# Patient Record
Sex: Male | Born: 1935 | Race: White | Hispanic: No | Marital: Married | Smoking: Never smoker
Health system: Southern US, Community
[De-identification: ages and names within clinical notes are randomized; demographics above are authoritative.]

## PROBLEM LIST (undated history)

## (undated) ENCOUNTER — Emergency Department (HOSPITAL_COMMUNITY): Payer: Medicare Other

## (undated) DIAGNOSIS — E039 Hypothyroidism, unspecified: Secondary | ICD-10-CM

## (undated) DIAGNOSIS — H5440 Blindness, one eye, unspecified eye: Secondary | ICD-10-CM

## (undated) DIAGNOSIS — C4491 Basal cell carcinoma of skin, unspecified: Secondary | ICD-10-CM

## (undated) DIAGNOSIS — C61 Malignant neoplasm of prostate: Secondary | ICD-10-CM

## (undated) DIAGNOSIS — D229 Melanocytic nevi, unspecified: Secondary | ICD-10-CM

## (undated) DIAGNOSIS — I10 Essential (primary) hypertension: Secondary | ICD-10-CM

## (undated) DIAGNOSIS — E785 Hyperlipidemia, unspecified: Secondary | ICD-10-CM

## (undated) DIAGNOSIS — C4492 Squamous cell carcinoma of skin, unspecified: Secondary | ICD-10-CM

## (undated) DIAGNOSIS — H269 Unspecified cataract: Secondary | ICD-10-CM

## (undated) DIAGNOSIS — Z87442 Personal history of urinary calculi: Secondary | ICD-10-CM

## (undated) DIAGNOSIS — C7951 Secondary malignant neoplasm of bone: Secondary | ICD-10-CM

## (undated) DIAGNOSIS — D751 Secondary polycythemia: Secondary | ICD-10-CM

## (undated) HISTORY — DX: Malignant neoplasm of prostate: C61

## (undated) HISTORY — PX: TONSILLECTOMY: SUR1361

## (undated) HISTORY — DX: Blindness, one eye, unspecified eye: H54.40

## (undated) HISTORY — PX: HERNIA REPAIR: SHX51

## (undated) HISTORY — DX: Secondary malignant neoplasm of bone: C79.51

## (undated) HISTORY — DX: Secondary polycythemia: D75.1

## (undated) HISTORY — DX: Hyperlipidemia, unspecified: E78.5

## (undated) HISTORY — PX: EYE SURGERY: SHX253

## (undated) HISTORY — PX: COLONOSCOPY: SHX174

## (undated) HISTORY — PX: MOHS SURGERY: SHX181

## (undated) HISTORY — DX: Unspecified cataract: H26.9

## (undated) HISTORY — PX: CATARACT EXTRACTION: SUR2

## (undated) HISTORY — PX: BLADDER SURGERY: SHX569

## (undated) HISTORY — PX: PROSTATE SURGERY: SHX751

## (undated) HISTORY — DX: Squamous cell carcinoma of skin, unspecified: C44.92

## (undated) HISTORY — DX: Essential (primary) hypertension: I10

---
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
## 1988-10-24 DIAGNOSIS — C61 Malignant neoplasm of prostate: Secondary | ICD-10-CM

## 1988-10-24 HISTORY — DX: Malignant neoplasm of prostate: C61

## 1997-08-06 ENCOUNTER — Ambulatory Visit (HOSPITAL_COMMUNITY): Admission: RE | Admit: 1997-08-06 | Discharge: 1997-08-06 | Payer: Self-pay | Admitting: Urology

## 1998-01-10 ENCOUNTER — Encounter: Admission: RE | Admit: 1998-01-10 | Discharge: 1998-04-10 | Payer: Self-pay | Admitting: Radiation Oncology

## 1998-08-15 ENCOUNTER — Ambulatory Visit (HOSPITAL_COMMUNITY): Admission: RE | Admit: 1998-08-15 | Discharge: 1998-08-15 | Payer: Self-pay | Admitting: Urology

## 2000-10-08 ENCOUNTER — Ambulatory Visit (HOSPITAL_COMMUNITY): Admit: 2000-10-08 | Discharge: 2000-10-08 | Payer: Self-pay | Admitting: Urology

## 2000-10-08 ENCOUNTER — Ambulatory Visit (HOSPITAL_COMMUNITY): Admission: RE | Admit: 2000-10-08 | Discharge: 2000-10-08 | Payer: Self-pay | Admitting: Urology

## 2000-10-08 ENCOUNTER — Encounter: Payer: Self-pay | Admitting: Urology

## 2002-09-07 DIAGNOSIS — D229 Melanocytic nevi, unspecified: Secondary | ICD-10-CM

## 2002-09-07 DIAGNOSIS — C4491 Basal cell carcinoma of skin, unspecified: Secondary | ICD-10-CM

## 2002-09-07 HISTORY — DX: Melanocytic nevi, unspecified: D22.9

## 2002-09-07 HISTORY — DX: Basal cell carcinoma of skin, unspecified: C44.91

## 2003-01-12 ENCOUNTER — Emergency Department (HOSPITAL_COMMUNITY): Admission: EM | Admit: 2003-01-12 | Discharge: 2003-01-12 | Payer: Self-pay | Admitting: Emergency Medicine

## 2003-01-15 ENCOUNTER — Ambulatory Visit (HOSPITAL_COMMUNITY): Admission: RE | Admit: 2003-01-15 | Discharge: 2003-01-15 | Payer: Self-pay | Admitting: Urology

## 2003-01-15 ENCOUNTER — Ambulatory Visit (HOSPITAL_BASED_OUTPATIENT_CLINIC_OR_DEPARTMENT_OTHER): Admission: RE | Admit: 2003-01-15 | Discharge: 2003-01-15 | Payer: Self-pay | Admitting: Urology

## 2005-01-13 ENCOUNTER — Ambulatory Visit (HOSPITAL_COMMUNITY): Admission: RE | Admit: 2005-01-13 | Discharge: 2005-01-13 | Payer: Self-pay | Admitting: Urology

## 2005-01-13 ENCOUNTER — Ambulatory Visit (HOSPITAL_BASED_OUTPATIENT_CLINIC_OR_DEPARTMENT_OTHER): Admission: RE | Admit: 2005-01-13 | Discharge: 2005-01-13 | Payer: Self-pay | Admitting: Urology

## 2005-03-06 ENCOUNTER — Emergency Department (HOSPITAL_COMMUNITY): Admission: EM | Admit: 2005-03-06 | Discharge: 2005-03-06 | Payer: Self-pay | Admitting: Emergency Medicine

## 2005-03-20 ENCOUNTER — Encounter: Admission: RE | Admit: 2005-03-20 | Discharge: 2005-03-20 | Payer: Self-pay | Admitting: Urology

## 2005-03-24 ENCOUNTER — Ambulatory Visit (HOSPITAL_BASED_OUTPATIENT_CLINIC_OR_DEPARTMENT_OTHER): Admission: RE | Admit: 2005-03-24 | Discharge: 2005-03-24 | Payer: Self-pay | Admitting: Urology

## 2005-03-30 ENCOUNTER — Emergency Department (HOSPITAL_COMMUNITY): Admission: EM | Admit: 2005-03-30 | Discharge: 2005-03-30 | Payer: Self-pay | Admitting: Emergency Medicine

## 2005-06-02 ENCOUNTER — Ambulatory Visit (HOSPITAL_BASED_OUTPATIENT_CLINIC_OR_DEPARTMENT_OTHER): Admission: RE | Admit: 2005-06-02 | Discharge: 2005-06-02 | Payer: Self-pay | Admitting: Urology

## 2009-04-03 DIAGNOSIS — C4492 Squamous cell carcinoma of skin, unspecified: Secondary | ICD-10-CM

## 2009-04-03 HISTORY — DX: Squamous cell carcinoma of skin, unspecified: C44.92

## 2011-06-30 ENCOUNTER — Encounter: Payer: Self-pay | Admitting: *Deleted

## 2011-11-09 ENCOUNTER — Telehealth: Payer: Self-pay | Admitting: Oncology

## 2011-11-09 NOTE — Telephone Encounter (Signed)
S/W pt in re NP appt 9/25 @ 10:30 w /Dr. Gaylyn Rong. Referring Dr. Jarold Motto Dx- Polycythemia  NP packet mail

## 2011-11-11 ENCOUNTER — Telehealth: Payer: Self-pay | Admitting: Oncology

## 2011-11-11 NOTE — Telephone Encounter (Signed)
C/D on 9/18 for appt 9/25.

## 2011-11-13 ENCOUNTER — Encounter: Payer: Self-pay | Admitting: Oncology

## 2011-11-13 DIAGNOSIS — D751 Secondary polycythemia: Secondary | ICD-10-CM | POA: Insufficient documentation

## 2011-11-18 ENCOUNTER — Other Ambulatory Visit: Payer: Self-pay | Admitting: Oncology

## 2011-11-18 ENCOUNTER — Ambulatory Visit (HOSPITAL_BASED_OUTPATIENT_CLINIC_OR_DEPARTMENT_OTHER): Payer: Medicare Other | Admitting: Oncology

## 2011-11-18 ENCOUNTER — Encounter: Payer: Self-pay | Admitting: Oncology

## 2011-11-18 ENCOUNTER — Ambulatory Visit: Payer: Medicare Other

## 2011-11-18 ENCOUNTER — Other Ambulatory Visit (HOSPITAL_BASED_OUTPATIENT_CLINIC_OR_DEPARTMENT_OTHER): Payer: Medicare Other | Admitting: Lab

## 2011-11-18 ENCOUNTER — Telehealth: Payer: Self-pay | Admitting: Oncology

## 2011-11-18 VITALS — BP 146/84 | HR 71 | Temp 96.8°F | Resp 20 | Ht 71.0 in | Wt 198.5 lb

## 2011-11-18 DIAGNOSIS — E785 Hyperlipidemia, unspecified: Secondary | ICD-10-CM

## 2011-11-18 DIAGNOSIS — Z8546 Personal history of malignant neoplasm of prostate: Secondary | ICD-10-CM

## 2011-11-18 DIAGNOSIS — D751 Secondary polycythemia: Secondary | ICD-10-CM

## 2011-11-18 DIAGNOSIS — I10 Essential (primary) hypertension: Secondary | ICD-10-CM

## 2011-11-18 DIAGNOSIS — D45 Polycythemia vera: Secondary | ICD-10-CM

## 2011-11-18 LAB — CBC WITH DIFFERENTIAL/PLATELET
Basophils Absolute: 0 10*3/uL (ref 0.0–0.1)
EOS%: 3.1 % (ref 0.0–7.0)
HGB: 16.8 g/dL (ref 13.0–17.1)
MCH: 32 pg (ref 27.2–33.4)
MONO%: 9 % (ref 0.0–14.0)
NEUT#: 4.8 10*3/uL (ref 1.5–6.5)
RBC: 5.24 10*6/uL (ref 4.20–5.82)
RDW: 13.9 % (ref 11.0–14.6)
lymph#: 1.4 10*3/uL (ref 0.9–3.3)

## 2011-11-18 LAB — COMPREHENSIVE METABOLIC PANEL (CC13)
AST: 32 U/L (ref 5–34)
Albumin: 4.1 g/dL (ref 3.5–5.0)
Alkaline Phosphatase: 79 U/L (ref 40–150)
BUN: 17 mg/dL (ref 7.0–26.0)
Creatinine: 1.3 mg/dL (ref 0.7–1.3)
Glucose: 89 mg/dl (ref 70–99)
Potassium: 4.3 mEq/L (ref 3.5–5.1)
Total Bilirubin: 0.7 mg/dL (ref 0.20–1.20)

## 2011-11-18 LAB — MORPHOLOGY: PLT EST: ADEQUATE

## 2011-11-18 NOTE — Progress Notes (Signed)
Checked in new pt with no financial concerns. °

## 2011-11-18 NOTE — Telephone Encounter (Signed)
gve the pt his sept 2014 appt calendar °

## 2011-11-18 NOTE — Patient Instructions (Addendum)
1. Diagnosis: Polycythemia (elevated rather cell count). 2. Possibility: polycythemia vera PV (spontaneous production of red blood cell by bone marrow without hormonal input) versus hypoxia (smoking, obstructive sleep apnea) 3. Work up:  JAK-2 mutation testing to rule out PV.  4. Treatment:  Pending work up.  5. Follow up:  CBC testing every 4 months here at the [facility].  Return visit in 1 year.

## 2011-11-18 NOTE — Progress Notes (Signed)
[facility]  Telephone:(336) [phone] Fax:(336) [phone]     INITIAL HEMATOLOGY CONSULTATION    Referral MD:  Dr. Barry Dienes. Martin Curry, [name]   Reason for Referral: polycythemia.     HPI:  [name] is a 78 year-old man with history of HTN, HLP, prostate cancer, non smoker.  He has always had Hgb in the upper range of normal.  He had a routine CBC drawn on 11/03/2011 which showed WBC 7.8; Hgb 18.5 (ref 12-18); Plt 240.  He was kindly referred to the [facility] for evaluation.  Of note, the day when he last had his CBC drawn, he was fasting and was not even drinking water.   [name] presented to the clinic for the first time today by himself.  He reports feeling well.  He denied fever, skin rash, skin darkening, erythromelalgia, snoring, feeling tired when he first wakes up in the morning.   Patient denies fever, anorexia, weight loss, fatigue, headache, visual changes, confusion, drenching night sweats, palpable lymph node swelling, mucositis, odynophagia, dysphagia, nausea vomiting, jaundice, chest pain, palpitation, shortness of breath, dyspnea on exertion, productive cough, gum bleeding, epistaxis, hematemesis, hemoptysis, abdominal pain, abdominal swelling, early satiety, melena, hematochezia, hematuria, skin rash, spontaneous bleeding, joint swelling, joint pain, heat or cold intolerance, bowel bladder incontinence, back pain, focal motor weakness, paresthesia, depression, suicidal or homicidal ideation, feeling hopelessness.   Past Medical History  Diagnosis Date  . Hypertension   . Hyperlipidemia   . Prostate cancer 1990's    total prostatectomy and adjuvant radiation. Last PSA was 0.02 in 09/2011  . 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 Polycythemia   . Cancer, skin, squamous cell   :    Past Surgical History  Procedure Date  . Prostate surgery   . Hernia repair   . Bladder surgery     due to prostatectomy.   . Mohs surgery   . Cataract  extraction     right  . Colonoscopy     neg in the past; due in 2014 with [place] Dr. Jarold Motto  :   CURRENT MEDS: Current Outpatient Prescriptions  Medication Sig Dispense Refill  . amLODipine-benazepril (LOTREL) 5-10 MG per capsule Take 1 capsule by mouth daily.      [name] Kitchen aspirin 81 MG tablet Take 81 mg by mouth daily.      . Multiple Vitamin (MULTIVITAMIN) tablet Take 1 tablet by mouth daily.      . potassium citrate (UROCIT-K) 10 MEQ (1080 MG) SR tablet Take 10 mEq by mouth 3 (three) times daily with meals.      . simvastatin (ZOCOR) 20 MG tablet Take 20 mg by mouth every evening.          Allergies  Allergen Reactions  . Penicillins   . Latex Rash  :  Family History  Problem Relation Age of Onset  . Uterine cancer Mother 68  . Lung cancer Father 18  . Prostate cancer Brother   . Prostate cancer Brother   :  History   Social History  . Marital Status: Married    Spouse Name: N/A    Number of Children: 3  . Years of Education: N/A   Occupational History  .      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 Smoking status: Never Smoker   . Smokeless tobacco: Not on file  . Alcohol Use: No  . Drug Use:  No  . Sexually Active:    Other Topics Concern  . Not on file   Social History Narrative  . No narrative on file  :  REVIEW OF SYSTEM:  The rest of the 14-point review of sytem was negative.   Exam: ECOG 0  General:  well-nourished man, in no acute distress.  Eyes:  no scleral icterus.  ENT:  There were no oropharyngeal lesions.  Neck was without thyromegaly.  Lymphatics:  Negative cervical, supraclavicular or axillary adenopathy.  Respiratory: lungs were clear bilaterally without wheezing or crackles.  Cardiovascular:  Regular rate and rhythm, S1/S2, without murmur, rub or gallop.  There was no pedal edema.  GI:  abdomen was soft, flat, nontender, nondistended, without organomegaly.  Muscoloskeletal:  no spinal tenderness of  palpation of vertebral spine.  His fingers did not show sign of clubbing or cyanosis.  Skin exam was without echymosis, petichae.  Neuro exam was nonfocal.  Patient was able to get on and off exam table without assistance.  Gait was normal.  Patient was alerted and oriented.  Attention was good.   Language was appropriate.  Mood was normal without depression.  Speech was not pressured.  Thought content was not tangential.    LABS:  Lab Results  Component Value Date   WBC 7.1 11/18/2011   HGB 16.8 11/18/2011   HCT 48.2 11/18/2011   PLT 232 11/18/2011   GLUCOSE 89 11/18/2011   ALT 48 11/18/2011   AST 32 11/18/2011   NA 141 11/18/2011   K 4.3 11/18/2011   CL 109* 11/18/2011   CREATININE 1.3 11/18/2011   BUN 17.0 11/18/2011   CO2 25 11/18/2011    Blood smear review:   I personally reviewed the patient's peripheral blood smear today.  There was isocytosis.  There was no peripheral blast.  There was no schistocytosis, spherocytosis, target cell, rouleaux formation, tear drop cell.  There was no giant platelets or platelet clumps.     ASSESSMENT AND PLAN:   1.  Hypertension:  On amlodipine/benazepril per PCP.  He is not on diuretics.  2.  Hyperlipidemia:  On simvastatin per PCP.  3.  Polycythemia:  *  Possibility: most likely due to transient dehydration last time his CBC was drawn.  I need to rule out polycythemia vera PV (spontaneous production of red blood cell by bone marrow without hormonal input).  He does not smoke.  He does not have sign of obstructive sleep apnea.    *  I sent for JAK-2 mutation testing to rule out PV.  I sent for serum erythropoietin to rule out secondary source of erythropoietin.  If erythropoietin is elevated, I will consider further work up.    *  Treatment:  Pending work up.    *Follow up:  CBC testing every 4 months with PCP to ensure that his Hgb does not rapidly increases.  Return visit in 1 year.     Thank you for this referral.   The length of time of the  face-to-face encounter was 30  minutes. More than 50% of time was spent counseling and coordination of care.

## 2011-11-19 LAB — ERYTHROPOIETIN: Erythropoietin: 8.8 m[IU]/mL (ref 2.6–34.0)

## 2011-12-04 ENCOUNTER — Encounter: Payer: Self-pay | Admitting: Cardiology

## 2012-03-28 ENCOUNTER — Telehealth: Payer: Self-pay | Admitting: *Deleted

## 2012-03-28 NOTE — Telephone Encounter (Signed)
Pt called states seeing Dr. [name] on Wed and Dr. Gaylyn Rong instructed for pt to have his CBC checked w/ PCP.  Pt asks what the "code" is for lab since Dr. Silvano Rusk office needs to know.  Informed of Diagnosis code Polycythemia 238.4 for CBC.  He verbalized understanding and will have labs sent to Dr. Gaylyn Rong.

## 2012-11-11 ENCOUNTER — Telehealth: Payer: Self-pay | Admitting: Hematology and Oncology

## 2012-11-11 NOTE — Telephone Encounter (Signed)
Pt called to cx 9/26 lb/fu. Per pt he just saw his primary and the numbers were fine thefore pt/primary decided it was ok to cx this appt. Desk nurse informed.

## 2012-11-18 ENCOUNTER — Other Ambulatory Visit: Payer: Medicare Other | Admitting: Lab

## 2012-11-18 ENCOUNTER — Ambulatory Visit: Payer: Medicare Other | Admitting: Oncology

## 2012-11-21 ENCOUNTER — Other Ambulatory Visit: Payer: Self-pay | Admitting: Dermatology

## 2012-11-21 DIAGNOSIS — C4491 Basal cell carcinoma of skin, unspecified: Secondary | ICD-10-CM

## 2012-11-21 HISTORY — DX: Basal cell carcinoma of skin, unspecified: C44.91

## 2013-03-09 ENCOUNTER — Other Ambulatory Visit: Payer: Self-pay | Admitting: Dermatology

## 2013-05-19 ENCOUNTER — Encounter: Payer: Self-pay | Admitting: Gastroenterology

## 2014-06-12 ENCOUNTER — Other Ambulatory Visit: Payer: Self-pay | Admitting: Dermatology

## 2014-07-19 ENCOUNTER — Other Ambulatory Visit: Payer: Self-pay | Admitting: Dermatology

## 2014-07-19 DIAGNOSIS — C4491 Basal cell carcinoma of skin, unspecified: Secondary | ICD-10-CM

## 2014-07-19 HISTORY — DX: Basal cell carcinoma of skin, unspecified: C44.91

## 2014-09-27 ENCOUNTER — Other Ambulatory Visit: Payer: Self-pay | Admitting: Dermatology

## 2015-07-16 ENCOUNTER — Other Ambulatory Visit: Payer: Self-pay | Admitting: Dermatology

## 2015-07-16 DIAGNOSIS — L309 Dermatitis, unspecified: Secondary | ICD-10-CM | POA: Diagnosis not present

## 2015-07-16 DIAGNOSIS — L905 Scar conditions and fibrosis of skin: Secondary | ICD-10-CM | POA: Diagnosis not present

## 2015-07-16 DIAGNOSIS — L570 Actinic keratosis: Secondary | ICD-10-CM | POA: Diagnosis not present

## 2015-07-16 DIAGNOSIS — C4442 Squamous cell carcinoma of skin of scalp and neck: Secondary | ICD-10-CM | POA: Diagnosis not present

## 2015-07-16 DIAGNOSIS — D485 Neoplasm of uncertain behavior of skin: Secondary | ICD-10-CM | POA: Diagnosis not present

## 2015-07-16 DIAGNOSIS — C4441 Basal cell carcinoma of skin of scalp and neck: Secondary | ICD-10-CM | POA: Diagnosis not present

## 2015-08-15 DIAGNOSIS — C4442 Squamous cell carcinoma of skin of scalp and neck: Secondary | ICD-10-CM | POA: Diagnosis not present

## 2015-09-26 ENCOUNTER — Other Ambulatory Visit: Payer: Self-pay | Admitting: Dermatology

## 2015-09-26 DIAGNOSIS — C4441 Basal cell carcinoma of skin of scalp and neck: Secondary | ICD-10-CM | POA: Diagnosis not present

## 2015-10-14 DIAGNOSIS — Z8546 Personal history of malignant neoplasm of prostate: Secondary | ICD-10-CM | POA: Diagnosis not present

## 2015-10-21 DIAGNOSIS — N393 Stress incontinence (female) (male): Secondary | ICD-10-CM | POA: Diagnosis not present

## 2015-10-21 DIAGNOSIS — Z87442 Personal history of urinary calculi: Secondary | ICD-10-CM | POA: Diagnosis not present

## 2015-10-21 DIAGNOSIS — Z8546 Personal history of malignant neoplasm of prostate: Secondary | ICD-10-CM | POA: Diagnosis not present

## 2015-10-21 DIAGNOSIS — N5201 Erectile dysfunction due to arterial insufficiency: Secondary | ICD-10-CM | POA: Diagnosis not present

## 2015-10-21 DIAGNOSIS — N320 Bladder-neck obstruction: Secondary | ICD-10-CM | POA: Diagnosis not present

## 2015-12-03 DIAGNOSIS — E038 Other specified hypothyroidism: Secondary | ICD-10-CM | POA: Diagnosis not present

## 2015-12-03 DIAGNOSIS — E784 Other hyperlipidemia: Secondary | ICD-10-CM | POA: Diagnosis not present

## 2015-12-03 DIAGNOSIS — N183 Chronic kidney disease, stage 3 (moderate): Secondary | ICD-10-CM | POA: Diagnosis not present

## 2015-12-10 DIAGNOSIS — C61 Malignant neoplasm of prostate: Secondary | ICD-10-CM | POA: Diagnosis not present

## 2015-12-10 DIAGNOSIS — H6123 Impacted cerumen, bilateral: Secondary | ICD-10-CM | POA: Diagnosis not present

## 2015-12-10 DIAGNOSIS — Z0000 Encounter for general adult medical examination without abnormal findings: Secondary | ICD-10-CM | POA: Diagnosis not present

## 2015-12-10 DIAGNOSIS — E038 Other specified hypothyroidism: Secondary | ICD-10-CM | POA: Diagnosis not present

## 2015-12-10 DIAGNOSIS — I10 Essential (primary) hypertension: Secondary | ICD-10-CM | POA: Diagnosis not present

## 2015-12-10 DIAGNOSIS — N183 Chronic kidney disease, stage 3 (moderate): Secondary | ICD-10-CM | POA: Diagnosis not present

## 2015-12-10 DIAGNOSIS — Z6830 Body mass index (BMI) 30.0-30.9, adult: Secondary | ICD-10-CM | POA: Diagnosis not present

## 2015-12-10 DIAGNOSIS — Z23 Encounter for immunization: Secondary | ICD-10-CM | POA: Diagnosis not present

## 2015-12-10 DIAGNOSIS — R32 Unspecified urinary incontinence: Secondary | ICD-10-CM | POA: Diagnosis not present

## 2015-12-10 DIAGNOSIS — E784 Other hyperlipidemia: Secondary | ICD-10-CM | POA: Diagnosis not present

## 2015-12-10 DIAGNOSIS — Z1389 Encounter for screening for other disorder: Secondary | ICD-10-CM | POA: Diagnosis not present

## 2016-02-27 DIAGNOSIS — Z23 Encounter for immunization: Secondary | ICD-10-CM | POA: Diagnosis not present

## 2016-10-13 DIAGNOSIS — Z8546 Personal history of malignant neoplasm of prostate: Secondary | ICD-10-CM | POA: Diagnosis not present

## 2016-10-22 DIAGNOSIS — N393 Stress incontinence (female) (male): Secondary | ICD-10-CM | POA: Diagnosis not present

## 2016-10-22 DIAGNOSIS — N320 Bladder-neck obstruction: Secondary | ICD-10-CM | POA: Diagnosis not present

## 2016-10-22 DIAGNOSIS — Z8546 Personal history of malignant neoplasm of prostate: Secondary | ICD-10-CM | POA: Diagnosis not present

## 2016-10-22 DIAGNOSIS — Z87442 Personal history of urinary calculi: Secondary | ICD-10-CM | POA: Diagnosis not present

## 2016-12-08 DIAGNOSIS — E7849 Other hyperlipidemia: Secondary | ICD-10-CM | POA: Diagnosis not present

## 2016-12-08 DIAGNOSIS — Z0000 Encounter for general adult medical examination without abnormal findings: Secondary | ICD-10-CM | POA: Diagnosis not present

## 2016-12-08 DIAGNOSIS — E038 Other specified hypothyroidism: Secondary | ICD-10-CM | POA: Diagnosis not present

## 2016-12-08 DIAGNOSIS — I10 Essential (primary) hypertension: Secondary | ICD-10-CM | POA: Diagnosis not present

## 2016-12-15 DIAGNOSIS — N183 Chronic kidney disease, stage 3 (moderate): Secondary | ICD-10-CM | POA: Diagnosis not present

## 2016-12-15 DIAGNOSIS — C61 Malignant neoplasm of prostate: Secondary | ICD-10-CM | POA: Diagnosis not present

## 2016-12-15 DIAGNOSIS — E038 Other specified hypothyroidism: Secondary | ICD-10-CM | POA: Diagnosis not present

## 2016-12-15 DIAGNOSIS — Z0000 Encounter for general adult medical examination without abnormal findings: Secondary | ICD-10-CM | POA: Diagnosis not present

## 2016-12-15 DIAGNOSIS — E7849 Other hyperlipidemia: Secondary | ICD-10-CM | POA: Diagnosis not present

## 2016-12-15 DIAGNOSIS — Z1389 Encounter for screening for other disorder: Secondary | ICD-10-CM | POA: Diagnosis not present

## 2016-12-15 DIAGNOSIS — Z6830 Body mass index (BMI) 30.0-30.9, adult: Secondary | ICD-10-CM | POA: Diagnosis not present

## 2016-12-15 DIAGNOSIS — R32 Unspecified urinary incontinence: Secondary | ICD-10-CM | POA: Diagnosis not present

## 2016-12-15 DIAGNOSIS — I10 Essential (primary) hypertension: Secondary | ICD-10-CM | POA: Diagnosis not present

## 2016-12-17 DIAGNOSIS — Z23 Encounter for immunization: Secondary | ICD-10-CM | POA: Diagnosis not present

## 2016-12-28 DIAGNOSIS — Z1212 Encounter for screening for malignant neoplasm of rectum: Secondary | ICD-10-CM | POA: Diagnosis not present

## 2017-06-22 DIAGNOSIS — I10 Essential (primary) hypertension: Secondary | ICD-10-CM | POA: Diagnosis not present

## 2017-06-22 DIAGNOSIS — S3210XA Unspecified fracture of sacrum, initial encounter for closed fracture: Secondary | ICD-10-CM | POA: Diagnosis not present

## 2017-06-22 DIAGNOSIS — Z6830 Body mass index (BMI) 30.0-30.9, adult: Secondary | ICD-10-CM | POA: Diagnosis not present

## 2017-06-22 DIAGNOSIS — N183 Chronic kidney disease, stage 3 (moderate): Secondary | ICD-10-CM | POA: Diagnosis not present

## 2017-06-22 DIAGNOSIS — M545 Low back pain: Secondary | ICD-10-CM | POA: Diagnosis not present

## 2017-10-14 DIAGNOSIS — B353 Tinea pedis: Secondary | ICD-10-CM | POA: Diagnosis not present

## 2017-10-14 DIAGNOSIS — L210 Seborrhea capitis: Secondary | ICD-10-CM | POA: Diagnosis not present

## 2017-10-14 DIAGNOSIS — Z6830 Body mass index (BMI) 30.0-30.9, adult: Secondary | ICD-10-CM | POA: Diagnosis not present

## 2017-10-21 DIAGNOSIS — Z8546 Personal history of malignant neoplasm of prostate: Secondary | ICD-10-CM | POA: Diagnosis not present

## 2017-10-28 DIAGNOSIS — Z87442 Personal history of urinary calculi: Secondary | ICD-10-CM | POA: Diagnosis not present

## 2017-10-28 DIAGNOSIS — N5201 Erectile dysfunction due to arterial insufficiency: Secondary | ICD-10-CM | POA: Diagnosis not present

## 2017-10-28 DIAGNOSIS — Z8546 Personal history of malignant neoplasm of prostate: Secondary | ICD-10-CM | POA: Diagnosis not present

## 2017-10-28 DIAGNOSIS — N393 Stress incontinence (female) (male): Secondary | ICD-10-CM | POA: Diagnosis not present

## 2017-11-02 ENCOUNTER — Other Ambulatory Visit: Payer: Self-pay | Admitting: Dermatology

## 2017-11-02 DIAGNOSIS — L910 Hypertrophic scar: Secondary | ICD-10-CM | POA: Diagnosis not present

## 2017-11-02 DIAGNOSIS — L821 Other seborrheic keratosis: Secondary | ICD-10-CM | POA: Diagnosis not present

## 2017-11-02 DIAGNOSIS — D485 Neoplasm of uncertain behavior of skin: Secondary | ICD-10-CM | POA: Diagnosis not present

## 2017-11-02 DIAGNOSIS — C4441 Basal cell carcinoma of skin of scalp and neck: Secondary | ICD-10-CM | POA: Diagnosis not present

## 2017-11-02 DIAGNOSIS — C4442 Squamous cell carcinoma of skin of scalp and neck: Secondary | ICD-10-CM | POA: Diagnosis not present

## 2017-11-02 DIAGNOSIS — D229 Melanocytic nevi, unspecified: Secondary | ICD-10-CM | POA: Diagnosis not present

## 2017-12-09 ENCOUNTER — Other Ambulatory Visit: Payer: Self-pay | Admitting: Dermatology

## 2017-12-09 DIAGNOSIS — L570 Actinic keratosis: Secondary | ICD-10-CM | POA: Diagnosis not present

## 2017-12-09 DIAGNOSIS — C4441 Basal cell carcinoma of skin of scalp and neck: Secondary | ICD-10-CM | POA: Diagnosis not present

## 2017-12-09 DIAGNOSIS — D485 Neoplasm of uncertain behavior of skin: Secondary | ICD-10-CM | POA: Diagnosis not present

## 2017-12-09 DIAGNOSIS — L708 Other acne: Secondary | ICD-10-CM | POA: Diagnosis not present

## 2017-12-28 DIAGNOSIS — E7849 Other hyperlipidemia: Secondary | ICD-10-CM | POA: Diagnosis not present

## 2017-12-28 DIAGNOSIS — R82998 Other abnormal findings in urine: Secondary | ICD-10-CM | POA: Diagnosis not present

## 2017-12-28 DIAGNOSIS — E038 Other specified hypothyroidism: Secondary | ICD-10-CM | POA: Diagnosis not present

## 2017-12-28 DIAGNOSIS — I10 Essential (primary) hypertension: Secondary | ICD-10-CM | POA: Diagnosis not present

## 2018-01-04 DIAGNOSIS — N183 Chronic kidney disease, stage 3 (moderate): Secondary | ICD-10-CM | POA: Diagnosis not present

## 2018-01-04 DIAGNOSIS — Z23 Encounter for immunization: Secondary | ICD-10-CM | POA: Diagnosis not present

## 2018-01-04 DIAGNOSIS — E7849 Other hyperlipidemia: Secondary | ICD-10-CM | POA: Diagnosis not present

## 2018-01-04 DIAGNOSIS — C61 Malignant neoplasm of prostate: Secondary | ICD-10-CM | POA: Diagnosis not present

## 2018-01-04 DIAGNOSIS — Z6830 Body mass index (BMI) 30.0-30.9, adult: Secondary | ICD-10-CM | POA: Diagnosis not present

## 2018-01-04 DIAGNOSIS — Z0000 Encounter for general adult medical examination without abnormal findings: Secondary | ICD-10-CM | POA: Diagnosis not present

## 2018-01-04 DIAGNOSIS — R21 Rash and other nonspecific skin eruption: Secondary | ICD-10-CM | POA: Diagnosis not present

## 2018-01-04 DIAGNOSIS — I10 Essential (primary) hypertension: Secondary | ICD-10-CM | POA: Diagnosis not present

## 2018-01-04 DIAGNOSIS — E038 Other specified hypothyroidism: Secondary | ICD-10-CM | POA: Diagnosis not present

## 2018-01-04 DIAGNOSIS — D751 Secondary polycythemia: Secondary | ICD-10-CM | POA: Diagnosis not present

## 2018-01-04 DIAGNOSIS — Z1389 Encounter for screening for other disorder: Secondary | ICD-10-CM | POA: Diagnosis not present

## 2018-01-07 DIAGNOSIS — Z1212 Encounter for screening for malignant neoplasm of rectum: Secondary | ICD-10-CM | POA: Diagnosis not present

## 2018-01-24 ENCOUNTER — Other Ambulatory Visit: Payer: Self-pay | Admitting: Dermatology

## 2018-01-24 DIAGNOSIS — D485 Neoplasm of uncertain behavior of skin: Secondary | ICD-10-CM | POA: Diagnosis not present

## 2018-01-24 DIAGNOSIS — D239 Other benign neoplasm of skin, unspecified: Secondary | ICD-10-CM | POA: Diagnosis not present

## 2018-01-24 DIAGNOSIS — L439 Lichen planus, unspecified: Secondary | ICD-10-CM | POA: Diagnosis not present

## 2018-03-17 DIAGNOSIS — Z6830 Body mass index (BMI) 30.0-30.9, adult: Secondary | ICD-10-CM | POA: Diagnosis not present

## 2018-03-17 DIAGNOSIS — H6123 Impacted cerumen, bilateral: Secondary | ICD-10-CM | POA: Diagnosis not present

## 2018-03-17 DIAGNOSIS — H9192 Unspecified hearing loss, left ear: Secondary | ICD-10-CM | POA: Diagnosis not present

## 2018-03-21 DIAGNOSIS — H6692 Otitis media, unspecified, left ear: Secondary | ICD-10-CM | POA: Diagnosis not present

## 2018-03-21 DIAGNOSIS — Z6830 Body mass index (BMI) 30.0-30.9, adult: Secondary | ICD-10-CM | POA: Diagnosis not present

## 2018-03-21 DIAGNOSIS — I10 Essential (primary) hypertension: Secondary | ICD-10-CM | POA: Diagnosis not present

## 2018-03-21 DIAGNOSIS — J302 Other seasonal allergic rhinitis: Secondary | ICD-10-CM | POA: Diagnosis not present

## 2018-09-27 ENCOUNTER — Encounter: Payer: Self-pay | Admitting: *Deleted

## 2018-11-02 DIAGNOSIS — Z23 Encounter for immunization: Secondary | ICD-10-CM | POA: Diagnosis not present

## 2018-11-04 DIAGNOSIS — Z8546 Personal history of malignant neoplasm of prostate: Secondary | ICD-10-CM | POA: Diagnosis not present

## 2018-11-11 DIAGNOSIS — Z87442 Personal history of urinary calculi: Secondary | ICD-10-CM | POA: Diagnosis not present

## 2018-11-11 DIAGNOSIS — N393 Stress incontinence (female) (male): Secondary | ICD-10-CM | POA: Diagnosis not present

## 2018-11-11 DIAGNOSIS — Z8546 Personal history of malignant neoplasm of prostate: Secondary | ICD-10-CM | POA: Diagnosis not present

## 2019-01-10 DIAGNOSIS — E038 Other specified hypothyroidism: Secondary | ICD-10-CM | POA: Diagnosis not present

## 2019-01-10 DIAGNOSIS — E7849 Other hyperlipidemia: Secondary | ICD-10-CM | POA: Diagnosis not present

## 2019-01-10 DIAGNOSIS — R82998 Other abnormal findings in urine: Secondary | ICD-10-CM | POA: Diagnosis not present

## 2019-01-10 DIAGNOSIS — N1831 Chronic kidney disease, stage 3a: Secondary | ICD-10-CM | POA: Diagnosis not present

## 2019-01-17 DIAGNOSIS — I129 Hypertensive chronic kidney disease with stage 1 through stage 4 chronic kidney disease, or unspecified chronic kidney disease: Secondary | ICD-10-CM | POA: Diagnosis not present

## 2019-01-17 DIAGNOSIS — D751 Secondary polycythemia: Secondary | ICD-10-CM | POA: Diagnosis not present

## 2019-01-17 DIAGNOSIS — Z1331 Encounter for screening for depression: Secondary | ICD-10-CM | POA: Diagnosis not present

## 2019-01-17 DIAGNOSIS — C61 Malignant neoplasm of prostate: Secondary | ICD-10-CM | POA: Diagnosis not present

## 2019-01-17 DIAGNOSIS — E039 Hypothyroidism, unspecified: Secondary | ICD-10-CM | POA: Diagnosis not present

## 2019-01-17 DIAGNOSIS — R21 Rash and other nonspecific skin eruption: Secondary | ICD-10-CM | POA: Diagnosis not present

## 2019-01-17 DIAGNOSIS — Z0000 Encounter for general adult medical examination without abnormal findings: Secondary | ICD-10-CM | POA: Diagnosis not present

## 2019-01-17 DIAGNOSIS — E785 Hyperlipidemia, unspecified: Secondary | ICD-10-CM | POA: Diagnosis not present

## 2019-01-17 DIAGNOSIS — N1831 Chronic kidney disease, stage 3a: Secondary | ICD-10-CM | POA: Diagnosis not present

## 2019-01-17 DIAGNOSIS — Z1339 Encounter for screening examination for other mental health and behavioral disorders: Secondary | ICD-10-CM | POA: Diagnosis not present

## 2019-01-17 DIAGNOSIS — R32 Unspecified urinary incontinence: Secondary | ICD-10-CM | POA: Diagnosis not present

## 2019-01-23 ENCOUNTER — Other Ambulatory Visit: Payer: Self-pay | Admitting: Dermatology

## 2019-01-23 DIAGNOSIS — C4441 Basal cell carcinoma of skin of scalp and neck: Secondary | ICD-10-CM | POA: Diagnosis not present

## 2019-01-23 DIAGNOSIS — L570 Actinic keratosis: Secondary | ICD-10-CM | POA: Diagnosis not present

## 2019-01-23 DIAGNOSIS — R202 Paresthesia of skin: Secondary | ICD-10-CM | POA: Diagnosis not present

## 2019-03-06 ENCOUNTER — Ambulatory Visit: Payer: Medicare Other | Attending: Internal Medicine

## 2019-03-06 DIAGNOSIS — Z23 Encounter for immunization: Secondary | ICD-10-CM

## 2019-03-06 NOTE — Progress Notes (Signed)
   Covid-19 Vaccination Clinic  Name:  MARKELL AVRAM    MRN: [id number] DOB: Mar 26, 1932  03/06/2019  Mr. Wooding was observed post Covid-19 immunization for 15 minutes without incidence. He was provided with Vaccine Information Sheet and instruction to access the V-Safe system.   Mr. Fitzke was instructed to call 911 with any severe reactions post vaccine: [name] Kitchen Difficulty breathing  . Swelling of your face and throat  . A fast heartbeat  . A bad rash all over your body  . Dizziness and weakness    Immunizations Administered    Name Date Dose VIS Date Route   Pfizer COVID-19 Vaccine 03/06/2019  9:32 AM 0.3 mL 02/03/2019 Intramuscular   Manufacturer: [place]   Lot: [id number]   [place]: [id number]

## 2019-03-08 ENCOUNTER — Ambulatory Visit: Payer: Self-pay

## 2019-03-24 ENCOUNTER — Ambulatory Visit: Payer: Medicare Other

## 2019-03-24 DIAGNOSIS — Z1212 Encounter for screening for malignant neoplasm of rectum: Secondary | ICD-10-CM | POA: Diagnosis not present

## 2019-03-25 ENCOUNTER — Ambulatory Visit: Payer: PPO | Attending: Internal Medicine

## 2019-03-25 DIAGNOSIS — Z23 Encounter for immunization: Secondary | ICD-10-CM | POA: Insufficient documentation

## 2019-03-25 NOTE — Progress Notes (Signed)
   Covid-19 Vaccination Clinic  Name:  Martin Curry    MRN: [id number] DOB: Jul 28, 1932  03/25/2019  [name] was observed post Covid-19 immunization for 15 minutes without incidence. He was provided with Vaccine Information Sheet and instruction to access the V-Safe system.   [name] was instructed to call 911 with any severe reactions post vaccine: [name] Kitchen Difficulty breathing  . Swelling of your face and throat  . A fast heartbeat  . A bad rash all over your body  . Dizziness and weakness    Immunizations Administered    Name Date Dose VIS Date Route   Pfizer COVID-19 Vaccine 03/25/2019  8:25 AM 0.3 mL 02/03/2019 Intramuscular   Manufacturer: [place]   Lot: [id number]   [place]: [id number]

## 2019-04-17 DIAGNOSIS — C4442 Squamous cell carcinoma of skin of scalp and neck: Secondary | ICD-10-CM | POA: Diagnosis not present

## 2019-04-17 DIAGNOSIS — C4441 Basal cell carcinoma of skin of scalp and neck: Secondary | ICD-10-CM | POA: Diagnosis not present

## 2019-04-19 ENCOUNTER — Encounter: Payer: Self-pay | Admitting: Plastic Surgery

## 2019-04-19 ENCOUNTER — Other Ambulatory Visit: Payer: Self-pay

## 2019-04-19 ENCOUNTER — Ambulatory Visit (INDEPENDENT_AMBULATORY_CARE_PROVIDER_SITE_OTHER): Payer: PPO | Admitting: Plastic Surgery

## 2019-04-19 VITALS — BP 160/90 | HR 74 | Temp 98.4°F | Ht 71.0 in | Wt 200.0 lb

## 2019-04-19 DIAGNOSIS — C4441 Basal cell carcinoma of skin of scalp and neck: Secondary | ICD-10-CM

## 2019-04-19 NOTE — Progress Notes (Addendum)
Referring Provider Leanna Battles, MD [address] [place],  [place] [address]   CC: No chief complaint on file. Basal cell carcinoma  Martin Curry is an 86 y.o. male.  HPI: Patient is presenting as a referral from Dr. Winifred Olive for a infiltrative nodular basal cell carcinoma in the right mastoid area.  The patient underwent a Mohs procedure earlier this week however they were unable to obtain negative margins and the deep surface.  Due to the proximity of further excision to the facial nerve patient was sent to me for evaluation.  Patient says he did have a lesion in that area behind his right ear for a number of years and initial biopsies came back negative however more recent biopsy showed the basal cell carcinoma diagnosis.  He has done well since his Mohs excision with no issues.  He has had quite a few nonmelanoma skin cancers excised in the past.  Allergies  Allergen Reactions  . Penicillins   . Latex Rash    Outpatient Encounter Medications as of 04/19/2019  Medication Sig  . amLODipine-atorvastatin (CADUET) 5-10 MG tablet Take 1 tablet by mouth daily.  . benazepril (LOTENSIN) 10 MG tablet Take 10 mg by mouth daily.  [name] Kitchen levothyroxine (SYNTHROID) 25 MCG tablet Take 25 mcg by mouth daily before breakfast.  . amLODipine-benazepril (LOTREL) 5-10 MG per capsule Take 1 capsule by mouth daily.  [name] Kitchen aspirin 81 MG tablet Take 81 mg by mouth daily.  . Multiple Vitamin (MULTIVITAMIN) tablet Take 1 tablet by mouth daily.  . potassium citrate (UROCIT-K) 10 MEQ (1080 MG) SR tablet Take 10 mEq by mouth 3 (three) times daily with meals.  . simvastatin (ZOCOR) 20 MG tablet Take 20 mg by mouth every evening.   No facility-administered encounter medications on file as of 04/19/2019.     Past Medical History:  Diagnosis Date  . Atypical nevus 09/07/2002   left abdomen-slight  . BCC (basal cell carcinoma of skin) 07/19/2014   left sideburn- +margin-exc.  [name] Kitchen BCC (basal cell carcinoma)  09/07/2002   right preauricular-exc., left crown of scalp-CX35FU  . BCC (basal cell carcinoma) 10/30/2003   right forehead-MOHS  . BCC (basal cell carcinoma) 01/07/2009   left scalp, left forearm  . BCC (basal cell carcinoma) 09/27/2014   left sideburn-free  . BCC (basal cell carcinoma) 07/16/2015   left scalp  . 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 Cancer, skin, squamous cell   . Hyperlipidemia   . Hypertension   . Nodular basal cell carcinoma (BCC) 11/21/2012   right side of scalp-CX35FU  . Nodular basal cell carcinoma (BCC) 06/12/2014   left sideburn-CX35FU/exc.  . Nodular basal cell carcinoma (BCC) 07/16/2015   left scalp-CX35FU  . Polycythemia   . Prostate cancer ([place]) 1990's   total prostatectomy and adjuvant radiation. Last PSA was 0.02 in 09/2011  . SCC (squamous cell carcinoma) 04/03/2009   forehead-txpbx  . SCC (squamous cell carcinoma) 07/16/2015   left front scalp  . SCC (squamous cell carcinoma) 07/16/2015   left front scalp-Cx35FU  . Superficial basal cell carcinoma (BCC) 11/21/2012   behind left ear-CX35FU, midback-CX35FU, right forehead-CX35FU-exc  . Superficial basal cell carcinoma (BCC) 11/02/2017   left post neck-CX35FU    Past Surgical History:  Procedure Laterality Date  . BLADDER SURGERY     due to prostatectomy.   [name] Kitchen CATARACT EXTRACTION     right  . COLONOSCOPY     neg in the past; due in 2014 with  [place] Dr. Sharlett Iles  . HERNIA REPAIR    . MOHS SURGERY    . PROSTATE SURGERY      Family History  Problem Relation Age of Onset  . Uterine cancer Mother 40  . Lung cancer Father 36  . Prostate cancer Brother   . Prostate cancer Brother     Social History   Social History Narrative  . Not on file  Denies tobacco use  Review of Systems General: Denies fevers, chills, weight loss CV: Denies chest pain, shortness of breath, palpitations  Physical Exam Vitals with BMI 04/19/2019 11/18/2011  Height 5\' 11"  5\' 11"   Weight 200 lbs 198 lbs 8 oz    BMI 123456 [id number]  Systolic 0000000 123456  Diastolic 90 84  Pulse 74 71    General:  No acute distress,  Alert and oriented, Non-Toxic, Normal speech and affect HEENT: He is normocephalic.  He is blind in his left eye from trauma.  Cranial nerves appear to be grossly intact.  In the right mastoid area there is a deep excision with a skin defect of about 4 cm in diameter.  There is exposed muscle and it appears to be approaching the stylomastoid foramen.  Assessment/Plan Patient presents with a complex defect in the right mastoid area and I believe the margins are still positive for the deep portion of the specimen.  Dr. Winifred Olive is going to send me a map of the last MOHS layer that was taken.  I'm going to seek the assistance of our ENT colleagues regarding further excision.  I did bring up the possibility of further imaging prior to moving forward.  I also mentioned the potential role of radiation.  I think it will be likely that Dr. Winifred Olive recommends radiation even if the margins are clear, and if that's the case we might consider that prior to sacrificing other vital structures.  I'm going to call the patient once I get some more information regarding consultation with ENT.  After discussing the case further with Dr. Constance Holster of ENT were going to get a CT of the neck with contrast and a CT of the temporal bone without contrast.  After the study is done I will arrange for the patient to see Dr. Constance Holster and also Dr. Lanell Persons of radiation oncology to get their input as well.  The patient may also be discussed at tumor board in the near future to get multidisciplinary input.  I called the patient to make sure he knows the plan and will work on getting his imaging scheduled as the next step.  Cindra Presume 04/19/2019, 10:55 AM

## 2019-04-20 NOTE — Addendum Note (Signed)
Addended by: Cindra Presume on: 04/20/2019 12:00 PM   Modules accepted: Orders

## 2019-04-21 ENCOUNTER — Other Ambulatory Visit: Payer: Self-pay

## 2019-04-21 ENCOUNTER — Other Ambulatory Visit: Payer: Self-pay | Admitting: Surgical

## 2019-04-21 ENCOUNTER — Ambulatory Visit (HOSPITAL_COMMUNITY)
Admission: RE | Admit: 2019-04-21 | Discharge: 2019-04-21 | Disposition: A | Discharge status: Home / Self Care | Payer: PPO | Source: Ambulatory Visit | Attending: Surgical | Admitting: Surgical

## 2019-04-21 ENCOUNTER — Other Ambulatory Visit: Payer: Self-pay | Admitting: Plastic Surgery

## 2019-04-21 ENCOUNTER — Ambulatory Visit (HOSPITAL_COMMUNITY): Payer: PPO

## 2019-04-21 ENCOUNTER — Other Ambulatory Visit (INDEPENDENT_AMBULATORY_CARE_PROVIDER_SITE_OTHER): Payer: PPO | Admitting: Plastic Surgery

## 2019-04-21 DIAGNOSIS — C4441 Basal cell carcinoma of skin of scalp and neck: Secondary | ICD-10-CM | POA: Insufficient documentation

## 2019-04-21 DIAGNOSIS — Z85828 Personal history of other malignant neoplasm of skin: Secondary | ICD-10-CM | POA: Diagnosis not present

## 2019-04-21 DIAGNOSIS — R220 Localized swelling, mass and lump, head: Secondary | ICD-10-CM | POA: Diagnosis not present

## 2019-04-21 DIAGNOSIS — R221 Localized swelling, mass and lump, neck: Secondary | ICD-10-CM | POA: Diagnosis not present

## 2019-04-21 LAB — POCT I-STAT CREATININE: Creatinine, Ser: 1.5 mg/dL — ABNORMAL HIGH (ref 0.61–1.24)

## 2019-04-21 MED ORDER — IOHEXOL 300 MG/ML  SOLN
100.0000 mL | Freq: Once | INTRAMUSCULAR | Status: AC | PRN
  Administered 2019-04-21: 75 mL via INTRAVENOUS

## 2019-04-21 MED ORDER — SODIUM CHLORIDE (PF) 0.9 % IJ SOLN
INTRAMUSCULAR | Status: AC
  Filled 2019-04-21: qty 50

## 2019-04-21 NOTE — Progress Notes (Cosign Needed)
Ordered BMP to check BUN and creatine for patients CT at [facility] long.

## 2019-04-21 NOTE — Progress Notes (Signed)
Re-order CT temporal bone wo cont and CT SOFT tissue neck w/ contrast for STAT

## 2019-04-24 ENCOUNTER — Telehealth: Payer: Self-pay | Admitting: *Deleted

## 2019-04-24 NOTE — Telephone Encounter (Signed)
Faxed order on (04/21/19) to [facility] for supplies for the patient.  Confirmation received.//AB/CMA

## 2019-04-24 NOTE — Telephone Encounter (Signed)
-----   Message from Charlies Constable, PA-C sent at 04/21/2019 12:55 PM EST ----- Hi guys, can we send in a wound care prism sheet for this patient? He will need to do daily or every other day dressing changes with xeroform, 4x4 gauze, medipore tape (call patient and see what tape he has been using and has been most helpful)  Defect size is: 4x4cm  Moderate exudate Full thickness 30 day supply should be plenty for now.  Ask Venetia Night if she would be able to sign it for me. Thanks, hopefully that's everything you need.

## 2019-04-25 ENCOUNTER — Telehealth: Payer: Self-pay | Admitting: Plastic Surgery

## 2019-04-25 NOTE — Telephone Encounter (Signed)
Patient can change the bandage if it is coming off. If he has received his wound care supplies, he can apply the adaptic layer followed by gauze and tape. It would be better to change it if it gets dirty instead of keeping the dirty dressing on.   In regards to difficulty changing it himself, he will need assistance from a family member if he is unable to change himself. I do not know of any other solutions. We may be able to place an order for home health, please check with patient if he feels he would like assistance with that. It would be beneficial since he is having difficulty himself.

## 2019-04-25 NOTE — Telephone Encounter (Signed)
Pt called back to say that his bandage/dressing is coming off and he has used to tape to keep in on but Dr. Winifred Olive told him not to take it off. He is unable to change it due to where it's located and his wife is not able to. He said that the provider who did the surgery said they could change the dressing but told him to check with Dr. Claudia Desanctis to see if there are any special instructions. Please call patient back to advise.

## 2019-04-26 NOTE — Telephone Encounter (Addendum)
Called and spoke with the patient on (04/25/19) and informed him of the message below regarding the bandage changes.  Patient verbalized understanding and stated that he does not feel that he can change the bandage himself because he is unable to see it to change it. His wife is also not able to help with the wound change.  He stated that he does not know anyone who is able to come and change it for him, and he feels they know what they are doing.    He feels getting home health out to do it would be good.  Informed the patient that I will reach out to home health to see if we can get him set-up for dressing changes.  Patient verbalized understanding and agreed.  Placed call to [place] w[facility] for her to RTC.//AB/CMA

## 2019-04-27 NOTE — Progress Notes (Signed)
Histology and Location of Primary Skin Cancer:  01/23/19  Diagnosis Skin , right neck, shave BASAL CELL CARCINOMA, NODULAR AND INFILTRATIVE PATTERNS  Martin Curry presented with the following signs/symptoms, months ago: He had the lesion behind his ear for several years, but was biopsied negative for cancer, and more recently the same area had been biopsied and showed the basal cell carcinoma diagnosis.   Past/Anticipated interventions by patient's surgeon/dermatologist for current problematic lesion, if any:  04/19/19 Dr. Claudia Desanctis Assessment/Plan Patient presents with a complex defect in the right mastoid area and I believe the margins are still positive for the deep portion of the specimen.  Dr. Winifred Olive is going to send me a map of the last MOHS layer that was taken.  I'm going to seek the assistance of our ENT colleagues regarding further excision.  I did bring up the possibility of further imaging prior to moving forward.  I also mentioned the potential role of radiation.  I think it will be likely that Dr. Winifred Olive recommends radiation even if the margins are clear, and if that's the case we might consider that prior to sacrificing other vital structures.  I'm going to call the patient once I get some more information regarding consultation with ENT.  After discussing the case further with Dr. Constance Holster of ENT were going to get a CT of the neck with contrast and a CT of the temporal bone without contrast.  After the study is done I will arrange for the patient to see Dr. Constance Holster and also Dr. Lanell Persons of radiation oncology to get their input as well.  The patient may also be discussed at tumor board in the near future to get multidisciplinary input.  I called the patient to make sure he knows the plan and will work on getting his imaging scheduled as the next step.   Per Dr. Keane Scrape notes he underwent Mohs with Dr. Winifred Olive the same week of 04/19/19  Past skin cancers, if any: Yes, multiple areas excised in  the past that were nonmelanoma.   1) Location/Histology/Intervention:   2) Location/Histology/Intervention:   3) Location/Histology/Intervention:   History of Blistering sunburns, if any:   SAFETY ISSUES:  Prior radiation? Yes, prostate radiation per Dr. Sondra Come, ? 1998 after prostate surgery.   Pacemaker/ICD? No  Possible current pregnancy? No  Is the patient on methotrexate? No  Current Complaints / other details:   04/21/19 CT Temporal bones and CT neck completed.

## 2019-04-27 NOTE — Telephone Encounter (Addendum)
Spoke with [place] w/[facility] on (04/26/19) and she stated that she will check to make sure they take the patient's insurance.    Received a call back on (04/26/19) from [place] w/[facility] and she stated that they do not accept the patient's insurance.  But for me to try calling [facility] to see if they accept the insurance.    Called [facility] and spoke with Lelon Frohlich and she stated that they do accept the patient's insurance.  She asked me to fax the patient's information and she will reach out the to patient.  Faxed patient's information to [facility] along with prescription with wound care orders.  Confirmation received.//AB/CMA

## 2019-04-28 ENCOUNTER — Ambulatory Visit (HOSPITAL_COMMUNITY): Payer: PPO

## 2019-04-28 ENCOUNTER — Other Ambulatory Visit: Payer: Self-pay | Admitting: Plastic Surgery

## 2019-05-01 ENCOUNTER — Encounter: Payer: Self-pay | Admitting: *Deleted

## 2019-05-02 ENCOUNTER — Encounter: Payer: Self-pay | Admitting: Radiation Oncology

## 2019-05-02 ENCOUNTER — Other Ambulatory Visit: Payer: Self-pay

## 2019-05-02 ENCOUNTER — Ambulatory Visit: Admission: RE | Admit: 2019-05-02 | Payer: PPO | Source: Ambulatory Visit

## 2019-05-02 ENCOUNTER — Encounter: Payer: Self-pay | Admitting: *Deleted

## 2019-05-02 ENCOUNTER — Telehealth: Payer: Self-pay | Admitting: Plastic Surgery

## 2019-05-02 ENCOUNTER — Ambulatory Visit
Admission: RE | Admit: 2019-05-02 | Discharge: 2019-05-02 | Disposition: A | Discharge status: Home / Self Care | Payer: PPO | Source: Ambulatory Visit | Attending: Radiation Oncology | Admitting: Radiation Oncology

## 2019-05-02 DIAGNOSIS — Z923 Personal history of irradiation: Secondary | ICD-10-CM | POA: Diagnosis not present

## 2019-05-02 DIAGNOSIS — Z8546 Personal history of malignant neoplasm of prostate: Secondary | ICD-10-CM | POA: Diagnosis not present

## 2019-05-02 DIAGNOSIS — C4441 Basal cell carcinoma of skin of scalp and neck: Secondary | ICD-10-CM

## 2019-05-02 NOTE — Telephone Encounter (Signed)
Called and spoke with the patient and informed him of the message below from [facility].  Patient verbalized understanding and agreed.//AB/CMA

## 2019-05-02 NOTE — Telephone Encounter (Signed)
This does not sound abnormal, likely some small amount of drainage, which is to be expected with an open wound.

## 2019-05-02 NOTE — Telephone Encounter (Signed)
Patient's daughter called to say that she was helping her father change his bandage and noticed there was a blood on it that may have been a day or so old. She just wanted to know if this was normal because it seemed to be far enough out that she wasn't expecting to see it. Please call [phone] to advise.

## 2019-05-02 NOTE — Progress Notes (Signed)
Radiation Oncology         [phone] ________________________________  Initial outpatient Consultation by MyChart video during pandemic precautions  Name: Martin Curry MRN: [id number]  Date: 05/02/2019  DOB: 04-20-1932  [id number], Quillian Quince, MD  Cindra Presume, MD   REFERRING PHYSICIAN: Cindra Presume, MD  DIAGNOSIS:    ICD-10-CM   1. Basal cell carcinoma, scalp/neck  C44.41   2. Basal cell carcinoma (BCC) of skin of neck  C44.41    Cancer Staging Basal cell carcinoma (BCC) of skin of neck Staging form: Cutaneous Carcinoma of the Head and Neck, AJCC 8th Edition - Clinical stage from 05/02/2019: Stage III (cT3, cN0, cM0) - Signed by Eppie Gibson, MD on 05/03/2019   CHIEF COMPLAINT: Here to discuss management of skin cancer  HISTORY OF PRESENT ILLNESS::Duwan D Hazelrigg is a 86 y.o. male who presented with a lesion at least 2 years ago on his right neck, behind his right ear.  Initially he states that it was quite small and looked like a blackhead.  The lesion had been present for several years and was benign on initial biopsies.  However it continued to progress.  Shave biopsy of the lesion on 01/23/2019 showed basal cell carcinoma, nodular and infiltrative patterns.  The patient was referred to Dr. Winifred Olive for Moh's procedure. 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  Maximum diameter of involved nurses 0.8 mm.  Several nerves are involved.  During the procedure, they were unable to obtain negative margins. Subsequently, the patient was referred to Dr. Claudia Desanctis in plastic surgery due to the proximity of the facial nerve. They opted to proceed with imaging scans to evaluate the extent of disease.   CT of the neck and temporal bones was performed on 04/21/2019 showing: residual 3.5 cm mass at resection bed; no CT evidence for invasion into adjacent mastoid bone or skull base; lesion appears to involve posterior  right parotid gland, and likely partially involves the right facial nerve; no evidence for distant metastatic disease or adenopathy.  The patient was also referred to Dr. Constance Holster.  He will see him in the next couple of days.  We discussed him at tumor board.  I have personally reviewed his images at tumor board as well as today.  The consensus among providers is that it is in his best interest to undergo a maximal surgical resection followed by radiotherapy.  He will talk about this with Dr. Constance Holster further.  Rim of right ear is numb; not hard of hearing on either side; baseline blindness of left eye. No facial numbness. No facial tingling.  No other lumps or bumps.   PREVIOUS RADIATION THERAPY: Yes  1999: Prostate Fossa (Dr. Sondra Come)  PAST MEDICAL HISTORY:  has a past medical history of Atypical nevus (09/07/2002), BCC (basal cell carcinoma of skin) (07/19/2014), BCC (basal cell carcinoma) (09/07/2002), BCC (basal cell carcinoma) (10/30/2003), BCC (basal cell carcinoma) (01/07/2009), BCC (basal cell carcinoma) (09/27/2014), BCC (basal cell carcinoma) (07/16/2015), Blind left eye, Cancer, skin, squamous cell, Hyperlipidemia, Hypertension, Nodular basal cell carcinoma (BCC) (11/21/2012), Nodular basal cell carcinoma (BCC) (06/12/2014), Nodular basal cell carcinoma (BCC) (07/16/2015), Polycythemia, Prostate cancer ([place]) (1990's), SCC (squamous cell carcinoma) (04/03/2009), SCC (squamous cell carcinoma) (07/16/2015), SCC (squamous cell carcinoma) (07/16/2015), Superficial basal cell carcinoma (BCC) (11/21/2012), and Superficial basal cell carcinoma (BCC) (11/02/2017).    PAST SURGICAL HISTORY: Past Surgical History:  Procedure Laterality Date   BLADDER SURGERY     due  to prostatectomy.    CATARACT EXTRACTION     right   COLONOSCOPY     neg in the past; due in 2014 with [place] Dr. Sharlett Iles   EYE SURGERY     50 years ago.    HERNIA REPAIR     MOHS SURGERY     PROSTATE SURGERY      FAMILY  HISTORY: family history includes Lung cancer (age of onset: 90) in his father; Prostate cancer in his brother and brother; Uterine cancer (age of onset: 66) in his mother.  SOCIAL HISTORY:  reports that he has never smoked. He has never used smokeless tobacco. He reports that he does not drink alcohol or use drugs.  ALLERGIES: Penicillins and Latex  MEDICATIONS:  Current Outpatient Medications  Medication Sig Dispense Refill   amLODipine (NORVASC) 5 MG tablet      aspirin 81 MG tablet Take 81 mg by mouth daily.     benazepril (LOTENSIN) 10 MG tablet Take 10 mg by mouth daily.     levothyroxine (SYNTHROID) 25 MCG tablet Take 25 mcg by mouth daily before breakfast.     Multiple Vitamin (MULTIVITAMIN) tablet Take 1 tablet by mouth daily.     potassium citrate (UROCIT-K) 10 MEQ (1080 MG) SR tablet Take 10 mEq by mouth 3 (three) times daily with meals.     simvastatin (ZOCOR) 20 MG tablet Take 20 mg by mouth every evening.     No current facility-administered medications for this encounter.    REVIEW OF SYSTEMS:  Notable for that above.   PHYSICAL EXAM:  vitals were not taken for this visit.   He is in no acute distress and provides a good history.   LABORATORY DATA:  Lab Results  Component Value Date   WBC 7.1 11/18/2011   HGB 16.8 11/18/2011   HCT 48.2 11/18/2011   MCV 92.1 11/18/2011   PLT 232 11/18/2011   CMP     Component Value Date/Time   NA 141 11/18/2011 1046   K 4.3 11/18/2011 1046   CL 109 (H) 11/18/2011 1046   CO2 25 11/18/2011 1046   GLUCOSE 89 11/18/2011 1046   BUN 17.0 11/18/2011 1046   CREATININE 1.50 (H) 04/21/2019 1723   CREATININE 1.3 11/18/2011 1046   CALCIUM 10.1 11/18/2011 1046   PROT 6.8 11/18/2011 1046   ALBUMIN 4.1 11/18/2011 1046   AST 32 11/18/2011 1046   ALT 48 11/18/2011 1046   ALKPHOS 79 11/18/2011 1046   BILITOT 0.70 11/18/2011 1046    I have personally reviewed his images today. RADIOGRAPHY: CT SOFT TISSUE NECK W  CONTRAST  Result Date: 04/21/2019 CLINICAL DATA:  History of skin cancer, right basal cell carcinoma at right posterior neck, recently partially resected. Staging. EXAM: CT NECK WITH CONTRAST CT TEMPORAL BONE WITHOUT CONTRAST TECHNIQUE: Multidetector CT imaging of the neck was performed using the standard protocol following the bolus administration of intravenous contrast. CONTRAST:  16mL OMNIPAQUE IOHEXOL 300 MG/ML  SOLN COMPARISON:  None available. FINDINGS: Pharynx and larynx: Oral cavity within normal limits without discrete mass or collection. Patient is edentulous. Palatine tonsils symmetric and within normal limits. Parapharyngeal fat maintained. Nasopharynx and oropharynx within normal limits. No retropharyngeal effusion or swelling. Epiglottis within normal limits. Vallecula clear. Remainder of the hypopharynx and supraglottic larynx within normal limits. True cords symmetric and normal. Subglottic airway clear. Salivary glands: Left parotid gland and submandibular glands are within normal limits. Large soft tissue defect extending from the right postauricular soft tissues inferiorly  is seen, consistent with history of recent basal cell resection. Few residual small foci of soft tissue emphysema seen within this region. There is residual soft tissue mass within this region measuring approximately 3.5 x 2.6 x 3.0 cm (AP by transverse by craniocaudad, series 3, image 32), most likely reflecting residual tumor. Superficial aspect of the lesion extends to the resection margin. Deep aspect closely abuts the proximal right internal jugular vein. Deep aspect of the lesion also closely approximates the distal right vertebral artery (series 3, image 34). Anterior aspect of the lesion abuts and appears to partially invade the posterior left parotid gland (series 3, image 32). Superior aspect of the lesion abuts the mastoid tip (series 7, image 52). No osseous destruction or erosion to suggest invasion into the  mastoid bone itself. No further superior extension into the skull base. Thyroid: Left thyroid gland normal. Enlarged heterogeneous right thyroid lobe with approximate 4 cm nodule (series 3, image 92). Lymph nodes: No pathologically enlarged or abnormal appearing lymph nodes identified within the neck. Vascular: Partially visualized ascending aorta/proximal arch dilated up to 4.8 cm. Moderate atherosclerotic disease within the arch itself and about the carotid bifurcations. Carotid siphon calcifications noted as well. Limited intracranial: Visualized intracranial contents within normal limits. Visualized orbits: Visualized globes and orbital soft tissues demonstrate no significant finding. Mastoids and visualized paranasal sinuses: Mild mucoperiosteal thickening noted within the visualized ethmoidal air cells. Visualized paranasal sinuses are otherwise clear. Mastoids discussed on corresponding temporal bone CT. Skeleton: No acute osseous abnormality. No discrete lytic or blastic osseous lesions. Mild multilevel cervical spondylosis, most notable at C5-6. Upper chest: Visualized upper chest demonstrates no acute or significant finding. Partially visualized lungs are clear. Other: None. CT TEMPORAL BONE: Right temporal bone: Large soft tissue defect related to recent partial resection of patient's known basal cell carcinoma. Residual soft tissue mass at the base of the resection cavity as delineated above. Again, superficial aspect of the lesion abuts and partially surrounds the right mastoid tip, better seen on corresponding neck CT portion of this exam. No osseous destruction or erosion to suggest direct invasion. Additionally, residual mass also abuts the right lateral aspect of the C1 vertebral body, which also appears intact without direct invasion. Associated trace right mastoid effusion. Visualized auricle and pinna within normal limits. External auditory canal clear. Minimal irregularity about the right  tympanic membrane noted, possibly related to previous/chronic inflammation. Right middle ear cavity clear. Ossicular chain intact and normally formed. Tegmen tympani intact. Trace inferior right mastoid effusion. Mastoid air cells otherwise clear. No coalescence. Sigmoid plate intact. Internal auditory canal normal. Inner ear structures including the vestibule, cochlea, and semi circular canals within normal limits. Facial nerve canal intact and bony covered. The residual soft tissue mass is positioned immediately inferior to the stylomastoid foramen, likely partially encompasses the right facial nerve. No direct extension through the stylomastoid foramen itself. Jugular bulb intact. Carotid canal intact. Left temporal bone: Auricle and pinna within normal limits. Pre and postauricular soft tissues unremarkable. Soft tissue density within the left EAC likely reflects cerumen. Tympanic membrane not well seen. Middle ear cavity clear. Tegmen tympani intact. No significant mastoid effusion. Sigmoid plate intact. Internal auditory canal normal. Inner ear structures including the vestibule, cochlea, and semi circular canals within normal limits. Facial nerve canal intact and bony covered. Jugular bulb and carotid canal intact. IMPRESSION: 1. Postoperative changes from recent partial resection of basal cell carcinoma positioned at the right posterolateral neck as above. Residual 3.5 x 2.6 x  3.0 cm mass at the resection bed consistent with residual tumor. No CT evidence for invasion into the adjacent mastoid bone or skull base. Lesion does appear to involve the posterior right parotid gland, and likely partially involves the right facial nerve. 2. No evidence for distant metastatic disease elsewhere within the neck. No adenopathy. Electronically Signed   By: Jeannine Boga [name]   On: 04/21/2019 22:22   CT TEMPORAL BONES WO CONTRAST  Result Date: 04/21/2019 CLINICAL DATA:  History of skin cancer, right basal cell  carcinoma at right posterior neck, recently partially resected. Staging. EXAM: CT NECK WITH CONTRAST CT TEMPORAL BONE WITHOUT CONTRAST TECHNIQUE: Multidetector CT imaging of the neck was performed using the standard protocol following the bolus administration of intravenous contrast. CONTRAST:  13mL OMNIPAQUE IOHEXOL 300 MG/ML  SOLN COMPARISON:  None available. FINDINGS: Pharynx and larynx: Oral cavity within normal limits without discrete mass or collection. Patient is edentulous. Palatine tonsils symmetric and within normal limits. Parapharyngeal fat maintained. Nasopharynx and oropharynx within normal limits. No retropharyngeal effusion or swelling. Epiglottis within normal limits. Vallecula clear. Remainder of the hypopharynx and supraglottic larynx within normal limits. True cords symmetric and normal. Subglottic airway clear. Salivary glands: Left parotid gland and submandibular glands are within normal limits. Large soft tissue defect extending from the right postauricular soft tissues inferiorly is seen, consistent with history of recent basal cell resection. Few residual small foci of soft tissue emphysema seen within this region. There is residual soft tissue mass within this region measuring approximately 3.5 x 2.6 x 3.0 cm (AP by transverse by craniocaudad, series 3, image 32), most likely reflecting residual tumor. Superficial aspect of the lesion extends to the resection margin. Deep aspect closely abuts the proximal right internal jugular vein. Deep aspect of the lesion also closely approximates the distal right vertebral artery (series 3, image 34). Anterior aspect of the lesion abuts and appears to partially invade the posterior left parotid gland (series 3, image 32). Superior aspect of the lesion abuts the mastoid tip (series 7, image 52). No osseous destruction or erosion to suggest invasion into the mastoid bone itself. No further superior extension into the skull base. Thyroid: Left thyroid  gland normal. Enlarged heterogeneous right thyroid lobe with approximate 4 cm nodule (series 3, image 92). Lymph nodes: No pathologically enlarged or abnormal appearing lymph nodes identified within the neck. Vascular: Partially visualized ascending aorta/proximal arch dilated up to 4.8 cm. Moderate atherosclerotic disease within the arch itself and about the carotid bifurcations. Carotid siphon calcifications noted as well. Limited intracranial: Visualized intracranial contents within normal limits. Visualized orbits: Visualized globes and orbital soft tissues demonstrate no significant finding. Mastoids and visualized paranasal sinuses: Mild mucoperiosteal thickening noted within the visualized ethmoidal air cells. Visualized paranasal sinuses are otherwise clear. Mastoids discussed on corresponding temporal bone CT. Skeleton: No acute osseous abnormality. No discrete lytic or blastic osseous lesions. Mild multilevel cervical spondylosis, most notable at C5-6. Upper chest: Visualized upper chest demonstrates no acute or significant finding. Partially visualized lungs are clear. Other: None. CT TEMPORAL BONE: Right temporal bone: Large soft tissue defect related to recent partial resection of patient's known basal cell carcinoma. Residual soft tissue mass at the base of the resection cavity as delineated above. Again, superficial aspect of the lesion abuts and partially surrounds the right mastoid tip, better seen on corresponding neck CT portion of this exam. No osseous destruction or erosion to suggest direct invasion. Additionally, residual mass also abuts the right lateral aspect of  the C1 vertebral body, which also appears intact without direct invasion. Associated trace right mastoid effusion. Visualized auricle and pinna within normal limits. External auditory canal clear. Minimal irregularity about the right tympanic membrane noted, possibly related to previous/chronic inflammation. Right middle ear cavity  clear. Ossicular chain intact and normally formed. Tegmen tympani intact. Trace inferior right mastoid effusion. Mastoid air cells otherwise clear. No coalescence. Sigmoid plate intact. Internal auditory canal normal. Inner ear structures including the vestibule, cochlea, and semi circular canals within normal limits. Facial nerve canal intact and bony covered. The residual soft tissue mass is positioned immediately inferior to the stylomastoid foramen, likely partially encompasses the right facial nerve. No direct extension through the stylomastoid foramen itself. Jugular bulb intact. Carotid canal intact. Left temporal bone: Auricle and pinna within normal limits. Pre and postauricular soft tissues unremarkable. Soft tissue density within the left EAC likely reflects cerumen. Tympanic membrane not well seen. Middle ear cavity clear. Tegmen tympani intact. No significant mastoid effusion. Sigmoid plate intact. Internal auditory canal normal. Inner ear structures including the vestibule, cochlea, and semi circular canals within normal limits. Facial nerve canal intact and bony covered. Jugular bulb and carotid canal intact. IMPRESSION: 1. Postoperative changes from recent partial resection of basal cell carcinoma positioned at the right posterolateral neck as above. Residual 3.5 x 2.6 x 3.0 cm mass at the resection bed consistent with residual tumor. No CT evidence for invasion into the adjacent mastoid bone or skull base. Lesion does appear to involve the posterior right parotid gland, and likely partially involves the right facial nerve. 2. No evidence for distant metastatic disease elsewhere within the neck. No adenopathy. Electronically Signed   By: Jeannine Boga [name]   On: 04/21/2019 22:22      IMPRESSION/PLAN: Skin Cancer (squamous cell)   We discussed his condition and treatment options in detail.  We discussed the tumor board recommendation that he undergo maximal safe resection followed by  radiotherapy.  He is going to meet with otolaryngology in the near future to discuss surgery in more detail.  The patient is concerned about facial nerve palsy.  In particular he is concerned about dysfunction of the eyelid as he is already blind in the contralateral eye.  He will discuss these concerns further with Dr. Constance Holster, and he understands that Dr. Constance Holster may use certain surgical techniques to minimize the risk of facial nerve paralysis.  We discussed that if he declines surgery we will give him definitive radiotherapy but that definitive radiotherapy would be less likely to give local regional control than if he undergoes combined therapy between both surgery and radiation.  We discussed different fractionation schemes of radiotherapy including standard fractionation which would be given over 6 to 7 weeks versus hypofractionation over 4 weeks.  We will discuss these treatment options in more detail once he is done with surgery.  We discussed the risks benefits and side effects of radiotherapy which would include but not necessarily be limited to skin irritation, fatigue, peeling of the skin, mucositis, xerostomia, dysgeusia, lymphedema, hearing loss, vertigo, permanent injury to the soft tissues, cartilage, and in her ear.  The patient is enthusiastic about proceeding with treatment for best chance of cure.  He understands there are no guarantees of treatment.  I very much look forward to meeting him in person once he is referred back to [place] by otolaryngology.  I anticipate making referrals to our nutritionist, physical therapist, and social worker, as he gets closer to starting radiotherapy.  This encounter was provided by telemedicine platform MyChart video  the patient has given verbal consent for this type of encounter and has been advised to only accept a meeting of this type in a secure network environment. On date of service, in total, I spent 50 minutes on this encounter. The attendants for  this meeting include Eppie Gibson  and [name].  During the encounter, Eppie Gibson was located at [facility] Radiation Oncology Department.  [name] was located at home.   __________________________________________   Eppie Gibson, MD  This document serves as a record of services personally performed by Eppie Gibson, MD. It was created on her behalf by Wilburn Mylar, a trained medical scribe. The creation of this record is based on the scribe's personal observations and the provider's statements to them. This document has been checked and approved by the attending provider.

## 2019-05-03 ENCOUNTER — Encounter: Payer: Self-pay | Admitting: Radiation Oncology

## 2019-05-03 DIAGNOSIS — C4441 Basal cell carcinoma of skin of scalp and neck: Secondary | ICD-10-CM | POA: Insufficient documentation

## 2019-05-03 NOTE — Progress Notes (Signed)
Oncology Nurse Navigator Documentation  Joined Mr. Durbano for MyChart consult with Dr. Isidore Moos to discuss RT for R post-auricular [facility] s/p recent [facility] procedure.  He was joined by his daughter. Introduced myself as his navigator, explained my role as a member of the Care Team, provided contact information. Explained procedure for targeted RT, showed him example of aquaplast mask. He voiced understanding per last week's ENT Conference discussion surgery followed by adjuvant RT is recommended for optimal management. He indicated he has appointment with ENT Constance Holster this Thursday.   He voiced understanding there will be further discussion with Dr. Isidore Moos re radiotherapy s/p decision with Dr. Constance Holster. I encouraged him to call me with questions/concerns moving forward.  Gayleen Orem, RN, BSN Head & Neck Oncology Nurse [place] at [place] [phone]

## 2019-05-04 DIAGNOSIS — C4441 Basal cell carcinoma of skin of scalp and neck: Secondary | ICD-10-CM | POA: Diagnosis not present

## 2019-05-05 ENCOUNTER — Telehealth: Payer: Self-pay | Admitting: *Deleted

## 2019-05-05 DIAGNOSIS — H547 Unspecified visual loss: Secondary | ICD-10-CM | POA: Diagnosis not present

## 2019-05-05 DIAGNOSIS — Z4801 Encounter for change or removal of surgical wound dressing: Secondary | ICD-10-CM | POA: Diagnosis not present

## 2019-05-05 DIAGNOSIS — Z9181 History of falling: Secondary | ICD-10-CM | POA: Diagnosis not present

## 2019-05-05 DIAGNOSIS — Z483 Aftercare following surgery for neoplasm: Secondary | ICD-10-CM | POA: Diagnosis not present

## 2019-05-05 DIAGNOSIS — C44211 Basal cell carcinoma of skin of unspecified ear and external auricular canal: Secondary | ICD-10-CM | POA: Diagnosis not present

## 2019-05-05 DIAGNOSIS — E785 Hyperlipidemia, unspecified: Secondary | ICD-10-CM | POA: Diagnosis not present

## 2019-05-05 DIAGNOSIS — Z8546 Personal history of malignant neoplasm of prostate: Secondary | ICD-10-CM | POA: Diagnosis not present

## 2019-05-05 DIAGNOSIS — Z7982 Long term (current) use of aspirin: Secondary | ICD-10-CM | POA: Diagnosis not present

## 2019-05-05 DIAGNOSIS — I10 Essential (primary) hypertension: Secondary | ICD-10-CM | POA: Diagnosis not present

## 2019-05-08 NOTE — Telephone Encounter (Signed)
Oncology Nurse Navigator Documentation  Received call from Mr. Godlewski in follow-up to yesterday's appt with ENT Constance Holster.   He indicated plan is to proceed with surgery for R ear BCC followed by adjuvant RT.  Surgery date pending. I explained post-surgical RT usually begins within 6 weeks of surgery pending release from Dr. Constance Holster, is preceded by CT SIM about 2 weeks prior.  He voiced understanding.   I noted I will monitor his progress, coordinate follow-up with Dr. Isidore Moos when appropriate.  He agreed to plan.  Gayleen Orem, RN, BSN Head & Neck Oncology Nurse [place] at [place] [phone]

## 2019-05-10 ENCOUNTER — Telehealth: Payer: Self-pay

## 2019-05-10 NOTE — Telephone Encounter (Signed)
Faxed signed certificate plan of care to [facility]

## 2019-05-12 NOTE — H&P (Signed)
HPI:   Martin Curry is a 86 y.o. male who presents as a new Patient.   Referring Provider: System, Provider Not In  Chief complaint: Skin cancer.  HPI: Overall healthy gentleman with a history of multiple basal cell cancers. He had 1 just below the right ear treated with Mohs surgery. There were positive margins at the depths. He has had CT imaging of the temporal bone and the neck. There is evidence of disease in the deep part of the parotid. He is blind in his left eye. This was a result of something that happened as a child. He does not smoke. He is not on anticoagulation. He has an open wound in the neck that is being treated with dressing.  PMH/Meds/All/SocHx/FamHx/ROS:   Past Medical History:  Diagnosis Date  . BCC (basal cell carcinoma)  . History of prostate cancer  . Loss of eye   Past Surgical History:  Procedure Laterality Date  . ENUCLEATION  . HERNIA REPAIR  . MOHS SURGERY  . PROSTATE SURGERY   No family history of bleeding disorders, wound healing problems or difficulty with anesthesia.   Social History   Socioeconomic History  . Marital status: Unknown  Spouse name: Not on file  . Number of children: Not on file  . Years of education: Not on file  . Highest education level: Not on file  Occupational History  . Not on file  Tobacco Use  . Smoking status: Never Smoker  . Smokeless tobacco: Never Used  Substance and Sexual Activity  . Alcohol use: Not Currently  . Drug use: Never  . Sexual activity: Not on file  Other Topics Concern  . Not on file  Social History Narrative  . Not on file   Social Determinants of Health   Financial Resource Strain:  . Difficulty of Paying Living Expenses:  Food Insecurity:  . Worried About Charity fundraiser in the Last Year:  . Arboriculturist in the Last Year:  Transportation Needs:  . Film/video editor (Medical):  [name] Kitchen Lack of Transportation (Non-Medical):  Physical Activity:  . Days of Exercise per  Week:  . Minutes of Exercise per Session:  Stress:  . Feeling of Stress :  Social Connections:  . Frequency of Communication with Friends and Family:  . Frequency of Social Gatherings with Friends and Family:  . Attends Religious Services:  . Active Member of Clubs or Organizations:  . Attends Archivist Meetings:  [name] Kitchen Marital Status:   Current Outpatient Medications:  . amLODIPine (NORVASC) 5 MG tablet, , Disp: , Rfl:  . aspirin 81 MG EC tablet *ANTIPLATELET*, Take 81 mg by mouth., Disp: , Rfl:  . benazepriL (LOTENSIN) 10 MG tablet, , Disp: , Rfl:  . levothyroxine (SYNTHROID) 25 MCG tablet, , Disp: , Rfl:  . multivitamin (MULTIVITAMIN) tablet, Take by mouth., Disp: , Rfl:  . potassium citrate (UROCIT-K) 10 mEq (1,080 mg) SR tablet, , Disp: , Rfl:  . simvastatin (ZOCOR) 20 MG tablet, , Disp: , Rfl:   A complete ROS was performed with pertinent positives/negatives noted in the HPI. The remainder of the ROS are negative.   Physical Exam:   BP 163/94  Pulse 84  Temp 97.3 F (36.3 C)  Ht 1.803 m (5\' 11" )  Wt 92.1 kg (203 lb)  BMI 28.31 kg/m   General: Healthy and alert, in no distress, breathing easily. Normal affect. In a pleasant mood. Head: Normocephalic, atraumatic. No masses, or scars. Eyes: Pupils  are equal, and reactive to light. Vision is grossly intact. No spontaneous or gaze nystagmus. Ears: Ear canals are clear. Tympanic membranes are intact, with normal landmarks and the middle ears are clear and healthy. Hearing: Grossly normal. Nose: Nasal cavities are clear with healthy mucosa, no polyps or exudate. Airways are patent. Face: No masses or scars, facial nerve function is symmetric. Oral Cavity: No mucosal abnormalities are noted. Tongue with normal mobility. Dentition appears healthy. Oropharynx: Tonsils are symmetric. There are no mucosal masses identified. Tongue base appears normal and healthy. Larynx/Hypopharynx: deferred Chest: Deferred Neck: No  palpable masses, no cervical adenopathy, no thyroid nodules or enlargement. There is a 5 or 6 cm open wound in the upper aspect of the neck along the upper sternocleidomastoid muscle just below the mastoid tip. There is no other mass palpable. Neuro: Cranial nerves II-XII with normal function, except for the left visual defect. Balance: Normal gate. Other findings: none.  Independent Review of Additional Tests or Records:  CT neck and temporal bones were reviewed.  Procedures:  none  Impression & Plans:  Basal cell carcinoma of the upper neck with apparent involvement of the parotid gland and the sternocleidomastoid muscle. He has already seen Dr. Isidore Moos for radiation consultation. The best option for this is surgical excision possibly followed by radiation. The second best option would be to cover this defect with a skin graft and then radiate. He is in very good health and would like to have the best chance of curing this. Surgery would involve a partial neck dissection, parotidectomy, possible deep lobe dissection. We discussed that there is a chance of damage to the facial nerve or the possible need for resection of the facial nerve. Given that his right eye is his only functioning eye that could be problematic. I would like to plan at the time of surgery a gold weight implant if we end up having to sacrifice the nerve. We will need a cervical facial flap to cover the defect postoperatively. I will talk with Dr. Claudia Desanctis and we will coordinate joint surgery. He understands all of these risks and the benefits of surgery. His daughter is here with him and she understands as well. I have invited them to call anytime with questions.

## 2019-05-15 NOTE — Progress Notes (Addendum)
[place] ([address]), [place] - [place] [id number] [place] ([place]) [place] [address] Phone: [phone] Fax: [phone]      Your procedure is scheduled on Monday, May 22, 2019.  Report to [facility] Main Entrance "A" at 6:30 A.M., and check in at the Admitting office.  Call this number if you have problems the morning of surgery:  [phone]  Call [phone] if you have any questions prior to your surgery date Monday-Friday 8am-4pm    Remember:  Do not eat or drink after midnight the night before your surgery    Take these medicines the morning of surgery with A SIP OF WATER:  amLODipine (NORVASC) levothyroxine (SYNTHROID)   Follow your surgeon's instructions on when to stop Aspirin.  If no instructions were given by your surgeon then you will need to call the office to get those instructions.     As of today, stop taking all other Aspirin containing products, Vitamins, Fish Oils, and Herbal Medications. Also stop all NSAIDS i.e. Advil, Ibuprofen, Motrin, Aleve, Anaprox, Naproxen, BC, Goody Powders, and all Supplements.   No Smoking of any kind, Tobacco, or Alcohol products 24 hours prior to your procedure. If you use a CPAP at night, you may bring all equipment for your overnight stay.                        Do not wear jewelry.            Do not wear lotions, powders, colognes, or deodorant.            Do not shave 48 hours prior to surgery.  Men may shave face and neck.            Do not bring valuables to the hospital.            [facility] is not responsible for any belongings or valuables.   Contacts, glasses, dentures or bridgework may not be worn into surgery.      For patients admitted to the hospital, discharge time will be determined by your treatment team.   Patients discharged the day of surgery will not be allowed to drive home, and someone needs to stay with them for 24 hours.    Special instructions:   [place]-  Preparing For Surgery  Before surgery, you can play an important role. Because skin is not sterile, your skin needs to be as free of germs as possible. You can reduce the number of germs on your skin by washing with CHG (chlorahexidine gluconate) Soap before surgery.  CHG is an antiseptic cleaner which kills germs and bonds with the skin to continue killing germs even after washing.    Oral Hygiene is also important to reduce your risk of infection.  Remember - BRUSH YOUR TEETH THE MORNING OF SURGERY WITH YOUR REGULAR TOOTHPASTE  Please do not use if you have an allergy to CHG or antibacterial soaps. If your skin becomes reddened/irritated stop using the CHG.  Do not shave (including legs and underarms) for at least 48 hours prior to first CHG shower. It is OK to shave your face.  Please follow these instructions carefully.   1. Shower the NIGHT BEFORE SURGERY and the MORNING OF SURGERY with CHG Soap.   2. If you chose to wash your hair, wash your hair first as usual with your normal shampoo.  3. After you shampoo, rinse your hair and body  thoroughly to remove the shampoo.  4. Use CHG as you would any other liquid soap. You can apply CHG directly to the skin and wash gently with a scrungie or a clean washcloth.   5. Apply the CHG Soap to your body ONLY FROM THE NECK DOWN.  Do not use on open wounds or open sores. Avoid contact with your eyes, ears, mouth and genitals (private parts). Wash Face and genitals (private parts)  with your normal soap.   6. Wash thoroughly, paying special attention to the area where your surgery will be performed.  7. Thoroughly rinse your body with warm water from the neck down.  8. DO NOT shower/wash with your normal soap after using and rinsing off the CHG Soap.  9. Pat yourself dry with a CLEAN TOWEL.  10. Wear CLEAN PAJAMAS to bed the night before surgery, wear comfortable clothes the morning of surgery  11. Place CLEAN SHEETS on your bed the night of  your first shower and DO NOT SLEEP WITH PETS.   Day of Surgery:   Do not apply any deodorants/lotions.  Please wear clean clothes to the hospital/surgery center.   Remember to brush your teeth WITH YOUR REGULAR TOOTHPASTE.   Please read over the following fact sheets that you were given.

## 2019-05-16 ENCOUNTER — Other Ambulatory Visit: Payer: Self-pay

## 2019-05-16 ENCOUNTER — Encounter (HOSPITAL_COMMUNITY): Payer: Self-pay

## 2019-05-16 ENCOUNTER — Other Ambulatory Visit (HOSPITAL_COMMUNITY): Payer: PPO

## 2019-05-16 ENCOUNTER — Encounter (HOSPITAL_COMMUNITY)
Admission: RE | Admit: 2019-05-16 | Discharge: 2019-05-16 | Disposition: A | Discharge status: Home / Self Care | Payer: PPO | Source: Ambulatory Visit | Attending: Otolaryngology | Admitting: Otolaryngology

## 2019-05-16 DIAGNOSIS — Z01818 Encounter for other preprocedural examination: Secondary | ICD-10-CM | POA: Diagnosis not present

## 2019-05-16 DIAGNOSIS — I10 Essential (primary) hypertension: Secondary | ICD-10-CM | POA: Diagnosis not present

## 2019-05-16 HISTORY — DX: Hypothyroidism, unspecified: E03.9

## 2019-05-16 HISTORY — DX: Personal history of urinary calculi: Z87.442

## 2019-05-16 LAB — CBC
HCT: 48.4 % (ref 39.0–52.0)
Hemoglobin: 16.3 g/dL (ref 13.0–17.0)
MCH: 32.1 pg (ref 26.0–34.0)
MCHC: 33.7 g/dL (ref 30.0–36.0)
MCV: 95.3 fL (ref 80.0–100.0)
Platelets: 278 10*3/uL (ref 150–400)
RBC: 5.08 MIL/uL (ref 4.22–5.81)
RDW: 13.1 % (ref 11.5–15.5)
WBC: 8.3 10*3/uL (ref 4.0–10.5)
nRBC: 0 % (ref 0.0–0.2)

## 2019-05-16 LAB — BASIC METABOLIC PANEL
Anion gap: 11 (ref 5–15)
BUN: 15 mg/dL (ref 8–23)
CO2: 25 mmol/L (ref 22–32)
Calcium: 9.8 mg/dL (ref 8.9–10.3)
Chloride: 105 mmol/L (ref 98–111)
Creatinine, Ser: 1.41 mg/dL — ABNORMAL HIGH (ref 0.61–1.24)
GFR calc Af Amer: 53 mL/min — ABNORMAL LOW (ref >60)
GFR calc non Af Amer: 45 mL/min — ABNORMAL LOW (ref >60)
Glucose, Bld: 103 mg/dL — ABNORMAL HIGH (ref 70–99)
Potassium: 3.9 mmol/L (ref 3.5–5.1)
Sodium: 141 mmol/L (ref 135–145)

## 2019-05-16 NOTE — Progress Notes (Addendum)
PCP - Dr. Leanna Battles Cardiologist - Denies Urologist: Dr. Irine Seal  PPM/ICD - Denies  Chest x-ray - N/A EKG - 05/16/19 Stress Test - 10/03/08 ECHO - 10/03/08 Cardiac Cath - Denies  Sleep Study - Denies  Pt denies being diabetic.  Blood Thinner Instructions: N/A Aspirin Instructions: LD - 05/16/19  ERAS Protcol - No  COVID TEST- Scheduled 05/18/19   Coronavirus Screening  Have you experienced the following symptoms:  Cough yes/no: No Fever (>100.13F)  yes/no: No Runny nose yes/no: No Sore throat yes/no: No Difficulty breathing/shortness of breath  yes/no: No  Have you or a family member traveled in the last 14 days and where? yes/no: No   If the patient indicates "YES" to the above questions, their PAT will be rescheduled to limit the exposure to others and, the surgeon will be notified. THE PATIENT WILL NEED TO BE ASYMPTOMATIC FOR 14 DAYS.   If the patient is not experiencing any of these symptoms, the PAT nurse will instruct them to NOT bring anyone with them to their appointment since they may have these symptoms or traveled as well.   Please remind your patients and families that hospital visitation restrictions are in effect and the importance of the restrictions.     Anesthesia review: No  Patient denies shortness of breath, fever, cough and chest pain at PAT appointment   All instructions explained to the patient, with a verbal understanding of the material. Patient agrees to go over the instructions while at home for a better understanding. Patient also instructed to self quarantine after being tested for COVID-19. The opportunity to ask questions was provided.

## 2019-05-18 ENCOUNTER — Other Ambulatory Visit (HOSPITAL_COMMUNITY)
Admission: RE | Admit: 2019-05-18 | Discharge: 2019-05-18 | Disposition: A | Discharge status: Home / Self Care | Payer: PPO | Source: Ambulatory Visit | Attending: Otolaryngology | Admitting: Otolaryngology

## 2019-05-18 DIAGNOSIS — Z01812 Encounter for preprocedural laboratory examination: Secondary | ICD-10-CM | POA: Diagnosis not present

## 2019-05-18 DIAGNOSIS — Z20822 Contact with and (suspected) exposure to covid-19: Secondary | ICD-10-CM | POA: Insufficient documentation

## 2019-05-18 LAB — SARS CORONAVIRUS 2 (TAT 6-24 HRS): SARS Coronavirus 2: NEGATIVE

## 2019-05-21 NOTE — Anesthesia Preprocedure Evaluation (Addendum)
Anesthesia Evaluation  Patient identified by MRN, date of birth, ID band Patient awake    Reviewed: Allergy & Precautions, NPO status , Patient's Chart, lab work & pertinent test results  Airway Mallampati: II  TM Distance: >3 FB Neck ROM: Full    Dental no notable dental hx. (+) Lower Dentures, Upper Dentures   Pulmonary neg pulmonary ROS,    Pulmonary exam normal breath sounds clear to auscultation       Cardiovascular hypertension, Pt. on medications Normal cardiovascular exam Rhythm:Regular Rate:Normal     Neuro/Psych negative neurological ROS  negative psych ROS   GI/Hepatic negative GI ROS, Neg liver ROS,   Endo/Other  Hypothyroidism   Renal/GU K+ 3.9 Cr 1.41     Musculoskeletal negative musculoskeletal ROS (+)   Abdominal   Peds  Hematology Hgb 16.3 Plt 278   Anesthesia Other Findings Pt Blind in Left Eye Basel cell CA  Reproductive/Obstetrics negative OB ROS                          Anesthesia Physical Anesthesia Plan  ASA: III  Anesthesia Plan: General   Post-op Pain Management:    Induction: Intravenous  PONV Risk Score and Plan: 3 and Treatment may vary due to age or medical condition, Ondansetron and Dexamethasone  Airway Management Planned: Oral ETT and Video Laryngoscope Planned  Additional Equipment: None  Intra-op Plan:   Post-operative Plan: Extubation in OR and Possible Post-op intubation/ventilation  Informed Consent: I have reviewed the patients History and Physical, chart, labs and discussed the procedure including the risks, benefits and alternatives for the proposed anesthesia with the patient or authorized representative who has indicated his/her understanding and acceptance.     Dental advisory given  Plan Discussed with:   Anesthesia Plan Comments: (18g IV Access)       Anesthesia Quick Evaluation

## 2019-05-22 ENCOUNTER — Inpatient Hospital Stay (HOSPITAL_COMMUNITY): Payer: PPO | Admitting: Anesthesiology

## 2019-05-22 ENCOUNTER — Encounter (HOSPITAL_COMMUNITY)
Admission: RE | Disposition: A | Discharge status: Home Health | Payer: Self-pay | Source: Home / Self Care | Attending: Otolaryngology

## 2019-05-22 ENCOUNTER — Other Ambulatory Visit: Payer: Self-pay

## 2019-05-22 ENCOUNTER — Encounter (HOSPITAL_COMMUNITY): Payer: Self-pay | Admitting: Otolaryngology

## 2019-05-22 ENCOUNTER — Inpatient Hospital Stay (HOSPITAL_COMMUNITY)
Admission: RE | Admit: 2019-05-22 | Discharge: 2019-05-24 | DRG: 145 | Disposition: A | Discharge status: Home Health | Payer: PPO | Attending: Otolaryngology | Admitting: Otolaryngology

## 2019-05-22 DIAGNOSIS — E039 Hypothyroidism, unspecified: Secondary | ICD-10-CM | POA: Diagnosis present

## 2019-05-22 DIAGNOSIS — I10 Essential (primary) hypertension: Secondary | ICD-10-CM | POA: Diagnosis present

## 2019-05-22 DIAGNOSIS — Z8546 Personal history of malignant neoplasm of prostate: Secondary | ICD-10-CM

## 2019-05-22 DIAGNOSIS — Z91040 Latex allergy status: Secondary | ICD-10-CM

## 2019-05-22 DIAGNOSIS — R29810 Facial weakness: Secondary | ICD-10-CM | POA: Diagnosis not present

## 2019-05-22 DIAGNOSIS — C07 Malignant neoplasm of parotid gland: Principal | ICD-10-CM | POA: Diagnosis present

## 2019-05-22 DIAGNOSIS — Z880 Allergy status to penicillin: Secondary | ICD-10-CM

## 2019-05-22 DIAGNOSIS — C4441 Basal cell carcinoma of skin of scalp and neck: Secondary | ICD-10-CM | POA: Diagnosis present

## 2019-05-22 DIAGNOSIS — H5462 Unqualified visual loss, left eye, normal vision right eye: Secondary | ICD-10-CM | POA: Diagnosis not present

## 2019-05-22 HISTORY — PX: NECK DISSECTION: SUR422

## 2019-05-22 HISTORY — PX: ADJACENT TISSUE TRANSFER/TISSUE REARRANGEMENT: SHX6829

## 2019-05-22 HISTORY — PX: PAROTIDECTOMY: SHX2163

## 2019-05-22 HISTORY — PX: ALLOGRAFT APPLICATION: SHX6404

## 2019-05-22 HISTORY — PX: RADICAL NECK DISSECTION: SHX2284

## 2019-05-22 SURGERY — DISSECTION, NECK, RADICAL
Anesthesia: General | Site: Neck | Laterality: Right

## 2019-05-22 MED ORDER — HYDROCODONE-ACETAMINOPHEN 5-325 MG PO TABS: 1.0000 | ORAL_TABLET | ORAL | Status: DC | PRN

## 2019-05-22 MED ORDER — HYDROMORPHONE HCL 1 MG/ML IJ SOLN
0.2500 mg | INTRAMUSCULAR | Status: DC | PRN
  Administered 2019-05-22: 0.5 mg via INTRAVENOUS

## 2019-05-22 MED ORDER — SUCCINYLCHOLINE 20MG/ML (10ML) SYRINGE FOR MEDFUSION PUMP - OPTIME
INTRAMUSCULAR | Status: DC | PRN
  Administered 2019-05-22: 120 mg via INTRAVENOUS

## 2019-05-22 MED ORDER — ACETAMINOPHEN 10 MG/ML IV SOLN
INTRAVENOUS | Status: AC
  Filled 2019-05-22: qty 100

## 2019-05-22 MED ORDER — ONDANSETRON HCL 4 MG/2ML IJ SOLN
INTRAMUSCULAR | Status: DC | PRN
  Administered 2019-05-22: 4 mg via INTRAVENOUS

## 2019-05-22 MED ORDER — HYDROMORPHONE HCL 1 MG/ML IJ SOLN
INTRAMUSCULAR | Status: AC
  Filled 2019-05-22: qty 1

## 2019-05-22 MED ORDER — BACITRACIN ZINC 500 UNIT/GM EX OINT
TOPICAL_OINTMENT | CUTANEOUS | Status: AC
  Filled 2019-05-22: qty 28.35

## 2019-05-22 MED ORDER — 0.9 % SODIUM CHLORIDE (POUR BTL) OPTIME
TOPICAL | Status: DC | PRN
  Administered 2019-05-22: 1000 mL

## 2019-05-22 MED ORDER — LIDOCAINE-EPINEPHRINE 1 %-1:100000 IJ SOLN
INTRAMUSCULAR | Status: AC
  Filled 2019-05-22: qty 2

## 2019-05-22 MED ORDER — FENTANYL CITRATE (PF) 250 MCG/5ML IJ SOLN
INTRAMUSCULAR | Status: AC
  Filled 2019-05-22: qty 5

## 2019-05-22 MED ORDER — DEXAMETHASONE SODIUM PHOSPHATE 10 MG/ML IJ SOLN
INTRAMUSCULAR | Status: DC | PRN
  Administered 2019-05-22: 5 mg via INTRAVENOUS

## 2019-05-22 MED ORDER — SODIUM CHLORIDE 0.9 % IV SOLN
INTRAVENOUS | Status: AC
  Filled 2019-05-22: qty 500000

## 2019-05-22 MED ORDER — OXYCODONE HCL 5 MG PO TABS: 5.0000 mg | ORAL_TABLET | Freq: Once | ORAL | Status: DC | PRN

## 2019-05-22 MED ORDER — PROPOFOL 10 MG/ML IV BOLUS
INTRAVENOUS | Status: AC
  Filled 2019-05-22: qty 40

## 2019-05-22 MED ORDER — BUPIVACAINE HCL 0.25 % IJ SOLN
INTRAMUSCULAR | Status: DC | PRN
  Administered 2019-05-22: 30 mL

## 2019-05-22 MED ORDER — SUCCINYLCHOLINE CHLORIDE 200 MG/10ML IV SOSY
PREFILLED_SYRINGE | INTRAVENOUS | Status: AC
  Filled 2019-05-22: qty 10

## 2019-05-22 MED ORDER — ONDANSETRON HCL 4 MG/2ML IJ SOLN: 4.0000 mg | Freq: Once | INTRAMUSCULAR | Status: DC | PRN

## 2019-05-22 MED ORDER — BACITRACIN ZINC 500 UNIT/GM EX OINT
TOPICAL_OINTMENT | CUTANEOUS | Status: DC | PRN
  Administered 2019-05-22: 1 via TOPICAL

## 2019-05-22 MED ORDER — PROPOFOL 500 MG/50ML IV EMUL
INTRAVENOUS | Status: DC | PRN
  Administered 2019-05-22: 100 ug/kg/min via INTRAVENOUS

## 2019-05-22 MED ORDER — PROPOFOL 10 MG/ML IV BOLUS
INTRAVENOUS | Status: DC | PRN
  Administered 2019-05-22: 50 mg via INTRAVENOUS
  Administered 2019-05-22: 150 mg via INTRAVENOUS

## 2019-05-22 MED ORDER — CLINDAMYCIN PHOSPHATE 900 MG/50ML IV SOLN
900.0000 mg | Freq: Once | INTRAVENOUS | Status: AC
  Administered 2019-05-22: 09:00:00 900 mg via INTRAVENOUS
  Filled 2019-05-22: qty 50

## 2019-05-22 MED ORDER — SODIUM CHLORIDE 0.9 % IV SOLN
INTRAVENOUS | Status: DC | PRN
  Administered 2019-05-22: 500 mL

## 2019-05-22 MED ORDER — PHENYLEPHRINE HCL-NACL 10-0.9 MG/250ML-% IV SOLN
INTRAVENOUS | Status: DC | PRN
  Administered 2019-05-22: 50 ug/min via INTRAVENOUS

## 2019-05-22 MED ORDER — BUPIVACAINE HCL (PF) 0.25 % IJ SOLN
INTRAMUSCULAR | Status: AC
  Filled 2019-05-22: qty 30

## 2019-05-22 MED ORDER — ONDANSETRON HCL 4 MG/2ML IJ SOLN
INTRAMUSCULAR | Status: AC
  Filled 2019-05-22: qty 2

## 2019-05-22 MED ORDER — LACTATED RINGERS IV SOLN: INTRAVENOUS | Status: DC | PRN

## 2019-05-22 MED ORDER — OXYCODONE HCL 5 MG/5ML PO SOLN: 5.0000 mg | Freq: Once | ORAL | Status: DC | PRN

## 2019-05-22 MED ORDER — MINERAL OIL LIGHT OIL
TOPICAL_OIL | Freq: Once | Status: DC
  Filled 2019-05-22 (×2): qty 10

## 2019-05-22 MED ORDER — IBUPROFEN 100 MG/5ML PO SUSP
400.0000 mg | Freq: Four times a day (QID) | ORAL | Status: DC | PRN
  Administered 2019-05-22 – 2019-05-24 (×7): 400 mg via ORAL
  Filled 2019-05-22 (×9): qty 20

## 2019-05-22 MED ORDER — DEXAMETHASONE SODIUM PHOSPHATE 10 MG/ML IJ SOLN
INTRAMUSCULAR | Status: AC
  Filled 2019-05-22: qty 1

## 2019-05-22 MED ORDER — LIDOCAINE 2% (20 MG/ML) 5 ML SYRINGE
INTRAMUSCULAR | Status: AC
  Filled 2019-05-22: qty 5

## 2019-05-22 MED ORDER — LACTATED RINGERS IV SOLN: INTRAVENOUS | Status: DC

## 2019-05-22 MED ORDER — FENTANYL CITRATE (PF) 100 MCG/2ML IJ SOLN
INTRAMUSCULAR | Status: DC | PRN
  Administered 2019-05-22 (×2): 50 ug via INTRAVENOUS
  Administered 2019-05-22: 100 ug via INTRAVENOUS

## 2019-05-22 MED ORDER — MORPHINE SULFATE (PF) 2 MG/ML IV SOLN: 2.0000 mg | INTRAVENOUS | Status: DC | PRN

## 2019-05-22 MED ORDER — ACETAMINOPHEN 10 MG/ML IV SOLN
1000.0000 mg | Freq: Once | INTRAVENOUS | Status: DC | PRN
  Administered 2019-05-22: 1000 mg via INTRAVENOUS

## 2019-05-22 MED ORDER — EPINEPHRINE PF 1 MG/ML IJ SOLN
INTRAMUSCULAR | Status: AC
  Filled 2019-05-22: qty 1

## 2019-05-22 MED ORDER — MEPERIDINE HCL 25 MG/ML IJ SOLN: 6.2500 mg | INTRAMUSCULAR | Status: DC | PRN

## 2019-05-22 MED ORDER — LIDOCAINE 2% (20 MG/ML) 5 ML SYRINGE
INTRAMUSCULAR | Status: DC | PRN
  Administered 2019-05-22: 100 mg via INTRAVENOUS

## 2019-05-22 MED ORDER — LIDOCAINE-EPINEPHRINE 1 %-1:100000 IJ SOLN
INTRAMUSCULAR | Status: DC | PRN
  Administered 2019-05-22: 40 mL

## 2019-05-22 SURGICAL SUPPLY — 98 items
APPLIER CLIP 9.375 SM OPEN (CLIP) ×5
ATTRACTOMAT 16X20 MAGNETIC DRP (DRAPES) ×5 IMPLANT
BAG DECANTER FOR FLEXI CONT (MISCELLANEOUS) ×5 IMPLANT
BLADE CLIPPER SURG (BLADE) IMPLANT
BLADE DERMATOME SS (BLADE) ×5 IMPLANT
BLADE SURG 15 STRL LF DISP TIS (BLADE) ×3 IMPLANT
BLADE SURG 15 STRL SS (BLADE) ×5
BNDG ELASTIC 4X5.8 VLCR STR LF (GAUZE/BANDAGES/DRESSINGS) IMPLANT
BNDG ELASTIC 6X5.8 VLCR STR LF (GAUZE/BANDAGES/DRESSINGS) IMPLANT
BNDG GAUZE ELAST 4 BULKY (GAUZE/BANDAGES/DRESSINGS) IMPLANT
CANISTER SUCT 3000ML PPV (MISCELLANEOUS) ×5 IMPLANT
CANISTER WOUND CARE 500ML ATS (WOUND CARE) IMPLANT
CLEANER TIP ELECTROSURG 2X2 (MISCELLANEOUS) ×5 IMPLANT
CLIP APPLIE 9.375 SM OPEN (CLIP) ×3 IMPLANT
CNTNR URN SCR LID CUP LEK RST (MISCELLANEOUS) IMPLANT
CONT SPEC 4OZ STRL OR WHT (MISCELLANEOUS)
CORD BIPOLAR FORCEPS 12FT (ELECTRODE) ×5 IMPLANT
COVER SURGICAL LIGHT HANDLE (MISCELLANEOUS) ×5 IMPLANT
COVER WAND RF STERILE (DRAPES) IMPLANT
DERMABOND ADVANCED (GAUZE/BANDAGES/DRESSINGS) ×4
DERMABOND ADVANCED .7 DNX12 (GAUZE/BANDAGES/DRESSINGS) ×6 IMPLANT
DERMACARRIERS GRAFT 1 TO 1.5 (DISPOSABLE)
DRAIN CHANNEL 15F RND FF W/TCR (WOUND CARE) IMPLANT
DRAIN HEMOVAC 7FR (DRAIN) IMPLANT
DRAIN SNY 10 ROU (WOUND CARE) IMPLANT
DRAIN WOUND SNY 15 RND (WOUND CARE) IMPLANT
DRAPE HALF SHEET 40X57 (DRAPES) ×5 IMPLANT
DRAPE INCISE 23X17 IOBAN STRL (DRAPES) ×2
DRAPE INCISE IOBAN 23X17 STRL (DRAPES) ×3 IMPLANT
DRAPE INCISE IOBAN 66X45 STRL (DRAPES) ×5 IMPLANT
DRAPE ORTHO SPLIT 77X108 STRL (DRAPES) ×10
DRAPE SURG ORHT 6 SPLT 77X108 (DRAPES) ×6 IMPLANT
DRSG CALCIUM ALGINATE 4X4 (GAUZE/BANDAGES/DRESSINGS) IMPLANT
DRSG MEPITEL 4X7.2 (GAUZE/BANDAGES/DRESSINGS) IMPLANT
DRSG OPSITE 6X11 MED (GAUZE/BANDAGES/DRESSINGS) IMPLANT
DRSG PAD ABDOMINAL 8X10 ST (GAUZE/BANDAGES/DRESSINGS) IMPLANT
DRSG VAC ATS LRG SENSATRAC (GAUZE/BANDAGES/DRESSINGS) IMPLANT
DRSG VAC ATS MED SENSATRAC (GAUZE/BANDAGES/DRESSINGS) IMPLANT
DRSG VAC ATS SM SENSATRAC (GAUZE/BANDAGES/DRESSINGS) IMPLANT
ELECT COATED BLADE 2.86 ST (ELECTRODE) ×5 IMPLANT
ELECT REM PT RETURN 9FT ADLT (ELECTROSURGICAL) ×5
ELECTRODE REM PT RTRN 9FT ADLT (ELECTROSURGICAL) ×3 IMPLANT
EVACUATOR SILICONE 100CC (DRAIN) ×5 IMPLANT
FILTER STRAW FLUID ASPIR (MISCELLANEOUS) IMPLANT
FORCEPS BIPOLAR SPETZLER 8 1.0 (NEUROSURGERY SUPPLIES) ×5 IMPLANT
GAUZE 4X4 16PLY RFD (DISPOSABLE) ×25 IMPLANT
GAUZE SPONGE 4X4 12PLY STRL (GAUZE/BANDAGES/DRESSINGS) IMPLANT
GAUZE XEROFORM 5X9 LF (GAUZE/BANDAGES/DRESSINGS) IMPLANT
GLOVE BIO SURGEON STRL SZ 6.5 (GLOVE) IMPLANT
GLOVE BIO SURGEONS STRL SZ 6.5 (GLOVE)
GLOVE BIOGEL M STRL SZ7.5 (GLOVE) IMPLANT
GLOVE ECLIPSE 7.5 STRL STRAW (GLOVE) IMPLANT
GLOVE INDICATOR 8.0 STRL GRN (GLOVE) ×10 IMPLANT
GLOVE SURG PROTEXIS BL SZ6.5 (GLOVE) ×5 IMPLANT
GLOVE SURG SS PI 7.5 STRL IVOR (GLOVE) ×10 IMPLANT
GOWN STRL REUS W/ TWL LRG LVL3 (GOWN DISPOSABLE) ×12 IMPLANT
GOWN STRL REUS W/TWL LRG LVL3 (GOWN DISPOSABLE) ×20
GRAFT DERMACARRIERS 1 TO 1.5 (DISPOSABLE) IMPLANT
HANDPIECE INTERPULSE COAX TIP (DISPOSABLE)
IV NS 1000ML (IV SOLUTION) ×5
IV NS 1000ML BAXH (IV SOLUTION) ×3 IMPLANT
KIT BASIN OR (CUSTOM PROCEDURE TRAY) ×10 IMPLANT
KIT TURNOVER KIT B (KITS) ×10 IMPLANT
LOCATOR NERVE 3 VOLT (DISPOSABLE) ×5 IMPLANT
NEEDLE PRECISIONGLIDE 27X1.5 (NEEDLE) ×5 IMPLANT
NEEDLE SPNL 18GX3.5 QUINCKE PK (NEEDLE) IMPLANT
NS IRRIG 1000ML POUR BTL (IV SOLUTION) ×10 IMPLANT
PACK GENERAL/GYN (CUSTOM PROCEDURE TRAY) IMPLANT
PAD ARMBOARD 7.5X6 YLW CONV (MISCELLANEOUS) ×20 IMPLANT
PENCIL FOOT CONTROL (ELECTRODE) ×5 IMPLANT
SET HNDPC FAN SPRY TIP SCT (DISPOSABLE) IMPLANT
SHEARS HARMONIC 9CM CVD (BLADE) ×5 IMPLANT
SPECIMEN JAR MEDIUM (MISCELLANEOUS) IMPLANT
SPONGE INTESTINAL PEANUT (DISPOSABLE) IMPLANT
SPONGE LAP 18X18 RF (DISPOSABLE) IMPLANT
STAPLER VISISTAT 35W (STAPLE) ×5 IMPLANT
SUT CHROMIC 3 0 SH 27 (SUTURE) ×5 IMPLANT
SUT CHROMIC 4 0 PS 2 18 (SUTURE) ×5 IMPLANT
SUT CHROMIC 5 0 P 3 (SUTURE) IMPLANT
SUT ETHILON 3 0 PS 1 (SUTURE) ×5 IMPLANT
SUT ETHILON 5 0 P 3 18 (SUTURE)
SUT ETHILON 5 0 PS 2 18 (SUTURE) IMPLANT
SUT MNCRL AB 3-0 PS2 18 (SUTURE) ×10 IMPLANT
SUT NYLON ETHILON 5-0 P-3 1X18 (SUTURE) IMPLANT
SUT PLAIN 4 0 FS 2 27 (SUTURE) ×5 IMPLANT
SUT SILK 2 0 REEL (SUTURE) IMPLANT
SUT SILK 2 0 SH CR/8 (SUTURE) ×5 IMPLANT
SUT SILK 3 0 SH CR/8 (SUTURE) ×5 IMPLANT
SUT SILK 4 0 REEL (SUTURE) ×5 IMPLANT
SUT VIC AB 3-0 SH 18 (SUTURE) ×5 IMPLANT
SYR 50ML LL SCALE MARK (SYRINGE) ×5 IMPLANT
SYR CONTROL 10ML LL (SYRINGE) ×5 IMPLANT
TOWEL GREEN STERILE (TOWEL DISPOSABLE) ×5 IMPLANT
TOWEL GREEN STERILE FF (TOWEL DISPOSABLE) ×10 IMPLANT
TRAY ENT MC OR (CUSTOM PROCEDURE TRAY) ×5 IMPLANT
TRAY FOLEY MTR SLVR 14FR STAT (SET/KITS/TRAYS/PACK) IMPLANT
TUBE FEEDING 10FR FLEXIFLO (MISCELLANEOUS) IMPLANT
UNDERPAD 30X30 (UNDERPADS AND DIAPERS) ×5 IMPLANT

## 2019-05-22 NOTE — Anesthesia Procedure Notes (Addendum)
Procedure Name: Intubation Date/Time: 05/22/2019 8:39 AM Performed by: Bryson Corona, CRNA Pre-anesthesia Checklist: Patient identified, Emergency Drugs available, Suction available and Patient being monitored Patient Re-evaluated:Patient Re-evaluated prior to induction Oxygen Delivery Method: Circle System Utilized Preoxygenation: Pre-oxygenation with 100% oxygen Induction Type: IV induction Ventilation: Mask ventilation without difficulty Laryngoscope Size: Glidescope and 4 Grade View: Grade I Tube type: Oral Tube size: 7.5 mm Number of attempts: 1 Airway Equipment and Method: Stylet and Oral airway Placement Confirmation: ETT inserted through vocal cords under direct vision,  positive ETCO2 and breath sounds checked- equal and bilateral Secured at: 22 cm Tube secured with: Tape Dental Injury: Teeth and Oropharynx as per pre-operative assessment  Comments: Intubation by Malachi Paradise, SRNA. Elective glidescope per Dr. Valma Cava

## 2019-05-22 NOTE — Op Note (Signed)
Operative Note   DATE OF OPERATION: 05/22/2019  SURGICAL DEPARTMENT: Plastic Surgery  PREOPERATIVE DIAGNOSES:  Right neck Mohs surgery defect 3 x 4 cm  POSTOPERATIVE DIAGNOSES:  same  PROCEDURE: 1.  Debridement of right neck Mohs surgery defect skin and subcutaneous tissue totaling 3 x 4 cm 2.  Complex closure of Mohs surgery defect and modified neck dissection totaling 18 cm in length  SURGEON: Talmadge Coventry, MD  ASSISTANT: Arville Care, MD  ANESTHESIA:  General.   COMPLICATIONS: None.   INDICATIONS FOR PROCEDURE:  The patient, Martin Curry is a 86 y.o. male born on 1933/01/30, is here for treatment of basal cell carcinoma of the right neck.  This was partially excised by his Mohs surgeon but there is a positive deep margin.  He sent to me and I asked Dr. Constance Holster to assist with excision of the tumor as it was invading the deeper neck structures.  We will perform in this case together and I am assisting him for the tumor removal and neck dissection and then I will perform the closure. MRN: [id number]  CONSENT:  Informed consent was obtained directly from the patient. Risks, benefits and alternatives were fully discussed. Specific risks including but not limited to bleeding, infection, hematoma, seroma, scarring, pain, contracture, asymmetry, wound healing problems, and need for further surgery were all discussed. The patient did have an ample opportunity to have questions answered to satisfaction.   DESCRIPTION OF PROCEDURE:  The patient was taken to the operating room. SCDs were placed and Ancef antibiotics were given.  General anesthesia was administered.  The patient's operative site was prepped and draped in a sterile fashion. A time out was performed and all information was confirmed to be correct.  Dr. Constance Holster started by performing his portion of the case.  This consisted of a superficial parotidectomy and modified neck dissection to get as much of the tumor out as possible.   Facial nerve was identified in this dissection and was spared.  Once the tumor had been removed and hemostasis meticulously obtained, we were left with the skin defect that was initially present on his presentation.  The borders of the skin were scarred and rolled and.  I did a sharp debridement of the skin and subcutaneous tissue around the borders of the wound.  I then obtained hemostasis with cautery.  This left a defect approximately 4-1/2 cm in diameter.  We had performed a significant amount of circumferential undermining as part of the exposure so I was able to advance the skin from the cheek posteriorly to close the defect.  After Dr. Constance Holster and placed his drain I closed the incision in layers with interrupted buried 3-0 Monocryl sutures and a running 4-0 plain gut.  Wound was dressed with bacitracin.  The patient tolerated the procedure well.  There were no complications. The patient was allowed to wake from anesthesia, extubated and taken to the recovery room in satisfactory condition.

## 2019-05-22 NOTE — Op Note (Signed)
OPERATIVE REPORT  DATE OF SURGERY: 05/22/2019  PATIENT:  Martin Curry,  86 y.o. male  PRE-OPERATIVE DIAGNOSIS:  BASAL CELL CARCINOMA OF THE RIGHT NECK  POST-OPERATIVE DIAGNOSIS:  BASAL CELL CARCINOMA OF THE RIGHT NECK  PROCEDURE:  Procedure(s): 1.  Limited NECK DISSECTION 2.  Superficial PAROTIDECTOMY with facial nerve dissection  3.  COMPLEX CLOSURE OF NECK WOUND   SURGEON:  Beckie Salts, MD, for procedure 1 and 2, Dr. Claudia Desanctis for procedure 3  ASSISTANTS: Dr. Claudia Desanctis for procedure 1 and 2, Dr. Constance Holster for procedure 3  ANESTHESIA:   General   EBL: 100 ml  DRAINS: 10 French round JP  LOCAL MEDICATIONS USED:  None  SPECIMEN: 1.  Right superficial parotidectomy with associated mass from the medial inferior portion for routine pathology. 2.  Tissue adjacent to the lateral process of the cervical spine for frozen section, positive for malignancy.  COUNTS:  Correct  PROCEDURE DETAILS: The patient was taken to the operating room and placed on the operating table in the supine position. Following induction of general endotracheal anesthesia, the right face and neck area were prepped with Betadine scrub and solution.  The surgical site was draped in a standard fashion.  1.  Superficial parotidectomy with facial nerve dissection.  The previous defect was identified and the dressing was removed.  There was exposure of the sternocleidomastoid muscle.  There was no gross tumor identified.  A standard parotidectomy incision was outlined with a marking pen with extension down along the facial hairline and then curved posteriorly for potential rotation flap.  Electrocautery was used to incise the skin and subcutaneous tissue.  A facial skin flap was elevated anteriorly exposing the entire parotid gland.  Posterior exposure was accomplished as well exposing the mastoid tip and upper sternocleidomastoid muscle area.  The gland was dissected off of the ear canal and brought forward.  The greater regular  nerve had been sacrificed during the initial Mohs surgery and attempts were not made to identify it.  As the gland was brought forward the posterior belly of the digastric muscle was identified.  The main trunk of the facial nerve was identified.  Using McCabe dissector lateral dissection of the gland off of the nerve was accomplished up to the pes anserinus and through all of the branches starting superiorly and working inferiorly.  The harmonic dissector was used to divide the parotid tissue off of the plane of the nerve.  The entire lateral lobe of the gland was removed and kept attached to the palpated tumor that was at the inferior medial portion of the parotid.  2.  Limited neck dissection.  Keeping the parotid and the neck specimen intact the sternocleidomastoid muscle was divided inferior to the palpated tumor.  The dissection was then brought forward and upward.  The mastoid tip was exposed and cleaned of muscular attachments of the sternocleidomastoid and the digastric.  The anterior aspect of the posterior belly of the digastric was divided.  Dissection continued medially towards the lateral process of the cervical spine.  The tumor was shaved off of the lateral process and the specimen was then then delivered.  An additional remnant of tissue off the cervical spine was taken for frozen section and was positive for malignancy.  No further dissection was accomplished at that point.  There is no other gross disease present other than that one area.  Hemostasis was completed using combination of bipolar cautery electrocautery and 4-0 silk ties as needed.  The spinal  accessory nerve was not identified during the dissection and was felt to be more inferior.  The internal jugular vein was also not separately identified.  3.  After irrigating the wound the wound was closed by Dr. Claudia Desanctis.  A drain was exited through separate stab incision and secured to the skin using nylon suture.  Patient was awakened  extubated and transferred to recovery in stable condition.  Pensions consultant was essential in all parts of this case for monitoring for facial nerve movement, for identification of nerve branches and bleeders, and for retraction.    PATIENT DISPOSITION:  To PACU, stable

## 2019-05-22 NOTE — Progress Notes (Signed)
Dr Constance Holster at bedside/aware of r droop/mouth , staes ; he has some weaknes; it will get better"

## 2019-05-22 NOTE — Transfer of Care (Addendum)
Immediate Anesthesia Transfer of Care Note  Patient: Martin Curry  Procedure(s) Performed: RADICAL NECK DISSECTION (Right Neck) PAROTIDECTOMY (Right Neck) COMPLEX CLOSURE OF NECK WOUND (N/A Neck) POSSIBLE FACIAL NERVE RECONSTRUCTION WITH NERVE ALLOGRAFT VS AUTOGRAFT (N/A Neck)  Patient Location: PACU  Anesthesia Type:General  Level of Consciousness: drowsy  Airway & Oxygen Therapy: Patient Spontanous Breathing and Patient connected to face mask oxygen  Post-op Assessment: Report given to RN and Post -op Vital signs reviewed and stable  Post vital signs: Reviewed and stable  Last Vitals:  Vitals Value Taken Time  BP 128/72   Temp    Pulse 77   Resp 19   SpO2 99     Last Pain:  Vitals:   05/22/19 0702  TempSrc:   PainSc: 0-No pain      Patients Stated Pain Goal: 2 ([id number])  Complications: No apparent anesthesia complications

## 2019-05-22 NOTE — Interval H&P Note (Signed)
History and Physical Interval Note:  05/22/2019 7:45 AM  Martin Curry  has presented today for surgery, with the diagnosis of BASAL CELL CARCINOMA OF THE RIGHT NECK.  The various methods of treatment have been discussed with the patient and family. After consideration of risks, benefits and other options for treatment, the patient has consented to  Procedure(s): RADICAL NECK DISSECTION (Right) PAROTIDECTOMY (Right) CLOSURE OF NECK WOUND WITH ADJACENT TISSUE TRANSFER (N/A) POSSIBLE SKIN GRAFT (N/A) POSSIBLE FACIAL NERVE RECONSTRUCTION WITH NERVE ALLOGRAFT VS AUTOGRAFT (N/A) as a surgical intervention.  The patient's history has been reviewed, patient examined, no change in status, stable for surgery.  I have reviewed the patient's chart and labs.  Questions were answered to the patient's satisfaction.     Cindra Presume

## 2019-05-22 NOTE — Anesthesia Postprocedure Evaluation (Signed)
Anesthesia Post Note  Patient: Martin Curry  Procedure(s) Performed: RADICAL NECK DISSECTION (Right Neck) PAROTIDECTOMY (Right Neck) COMPLEX CLOSURE OF NECK WOUND (N/A Neck) POSSIBLE FACIAL NERVE RECONSTRUCTION WITH NERVE ALLOGRAFT VS AUTOGRAFT (N/A Neck)     Patient location during evaluation: PACU Anesthesia Type: General Level of consciousness: awake and alert Pain management: pain level controlled Vital Signs Assessment: post-procedure vital signs reviewed and stable Respiratory status: spontaneous breathing, nonlabored ventilation, respiratory function stable and patient connected to nasal cannula oxygen Cardiovascular status: blood pressure returned to baseline and stable Postop Assessment: no apparent nausea or vomiting Anesthetic complications: no    Last Vitals:  Vitals:   05/22/19 1221 05/22/19 1245  BP:  126/66  Pulse: 76 73  Resp: (!) 22 18  Temp: 36.4 C 36.5 C  SpO2: 95% 93%    Last Pain:  Vitals:   05/22/19 1300  TempSrc:   PainSc: 0-No pain                 Barnet Glasgow

## 2019-05-22 NOTE — Progress Notes (Signed)
New consent verbally confirmed with Dr. Claudia Desanctis for his part of the procedure.  Procedure: closer of neck wound with adjacent tissue transfer, possible skin graft, possible facial nerve reconstruction with nerve allograft versus autograft. Reason: neck wound.

## 2019-05-22 NOTE — Interval H&P Note (Signed)
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  The various methods of treatment have been discussed with the patient and family. After consideration of risks, benefits and other options for treatment, the patient has consented to  Procedure(s): RADICAL NECK DISSECTION (Right) PAROTIDECTOMY (Right) CLOSURE OF NECK WOUND WITH ADJACENT TISSUE TRANSFER (N/A) POSSIBLE SKIN GRAFT (N/A) POSSIBLE FACIAL NERVE RECONSTRUCTION WITH NERVE ALLOGRAFT VS AUTOGRAFT (N/A) as a surgical intervention.  The patient's history has been reviewed, patient examined, no change in status, stable for surgery.  I have reviewed the patient's chart and labs.  Questions were answered to the patient's satisfaction.     Izora Gala

## 2019-05-22 NOTE — Progress Notes (Signed)
ENT Post Operative Note  Subjective: No nausea, no vomiting No difficulty voiding Pain well controlled Has had a little sore throat but has been able to eat soup.  Has ambulated to the bathroom.  Vitals:   05/22/19 1245 05/22/19 1615  BP: 126/66 132/80  Pulse: 73 92  Resp: 18 20  Temp: 97.7 F (36.5 C) 97.8 F (36.6 C)  SpO2: 93% 98%     OBJECTIVE  Gen: alert, cooperative, appropriate Head/ENT: EOMI, neck supple, mucus membranes moist and pink, conjunctiva clear Incisions c/d/i, JP holding suction with minimal sanguinous drainage. Right face with mild weakness.  House-[place]ack[place] 3.  Eye closure remains complete. Respiratory: Voice without dysphonia. non-labored breathing, no accessory muscle use, normal HR, good O2 saturations    ASSESS/ PLAN  Martin Curry is a 86 y.o. male who is POD 0 from right limited neck dissection and superficial parotidectomy with complex closure of neck wound.  -pain control -MIVF- will saline lock when tolerating PO intake -Imaging recharge JP as indicated. -Patient to stay overnight.  Helayne Seminole, MD

## 2019-05-23 MED ORDER — SIMVASTATIN 20 MG PO TABS
20.0000 mg | ORAL_TABLET | Freq: Every evening | ORAL | Status: DC
  Administered 2019-05-23: 20 mg via ORAL
  Filled 2019-05-23: qty 1

## 2019-05-23 MED ORDER — BENAZEPRIL HCL 5 MG PO TABS
10.0000 mg | ORAL_TABLET | Freq: Every day | ORAL | Status: DC
  Administered 2019-05-23 – 2019-05-24 (×2): 10 mg via ORAL
  Filled 2019-05-23 (×2): qty 2

## 2019-05-23 MED ORDER — POTASSIUM CITRATE ER 10 MEQ (1080 MG) PO TBCR
10.0000 meq | EXTENDED_RELEASE_TABLET | Freq: Three times a day (TID) | ORAL | Status: DC
  Administered 2019-05-23 – 2019-05-24 (×5): 10 meq via ORAL
  Filled 2019-05-23 (×7): qty 1

## 2019-05-23 MED ORDER — ARTIFICIAL TEARS OPHTHALMIC OINT
TOPICAL_OINTMENT | OPHTHALMIC | Status: DC | PRN
  Filled 2019-05-23: qty 3.5

## 2019-05-23 MED ORDER — HYPROMELLOSE (GONIOSCOPIC) 2.5 % OP SOLN
1.0000 [drp] | Freq: Four times a day (QID) | OPHTHALMIC | Status: DC | PRN
  Administered 2019-05-23: 1 [drp] via OPHTHALMIC
  Filled 2019-05-23: qty 15

## 2019-05-23 MED ORDER — PROMETHAZINE HCL 25 MG PO TABS: 25.0000 mg | ORAL_TABLET | Freq: Four times a day (QID) | ORAL | Status: DC | PRN

## 2019-05-23 MED ORDER — BACITRACIN ZINC 500 UNIT/GM EX OINT
1.0000 "application " | TOPICAL_OINTMENT | Freq: Three times a day (TID) | CUTANEOUS | Status: DC
  Administered 2019-05-23 – 2019-05-24 (×5): 1 via TOPICAL
  Filled 2019-05-23: qty 28.35

## 2019-05-23 MED ORDER — AMLODIPINE BESYLATE 5 MG PO TABS
5.0000 mg | ORAL_TABLET | Freq: Every day | ORAL | Status: DC
  Administered 2019-05-23 – 2019-05-24 (×2): 5 mg via ORAL
  Filled 2019-05-23 (×2): qty 1

## 2019-05-23 MED ORDER — LEVOTHYROXINE SODIUM 25 MCG PO TABS
25.0000 ug | ORAL_TABLET | Freq: Every day | ORAL | Status: DC
  Administered 2019-05-23 – 2019-05-24 (×2): 25 ug via ORAL
  Filled 2019-05-23 (×2): qty 1

## 2019-05-23 MED ORDER — PROMETHAZINE HCL 25 MG RE SUPP
25.0000 mg | Freq: Four times a day (QID) | RECTAL | Status: DC | PRN
  Filled 2019-05-23: qty 1

## 2019-05-23 MED ORDER — DEXTROSE-NACL 5-0.9 % IV SOLN: INTRAVENOUS | Status: DC

## 2019-05-23 MED ORDER — FAMOTIDINE 20 MG PO TABS
20.0000 mg | ORAL_TABLET | ORAL | Status: DC | PRN
  Administered 2019-05-23: 20 mg via ORAL
  Filled 2019-05-23 (×2): qty 1

## 2019-05-23 MED ORDER — HYDROCODONE-ACETAMINOPHEN 7.5-325 MG PO TABS: 1.0000 | ORAL_TABLET | Freq: Four times a day (QID) | ORAL | 0 refills | Status: DC | PRN

## 2019-05-23 MED ORDER — ASPIRIN EC 81 MG PO TBEC
81.0000 mg | DELAYED_RELEASE_TABLET | Freq: Every day | ORAL | Status: DC
  Administered 2019-05-23 – 2019-05-24 (×2): 81 mg via ORAL
  Filled 2019-05-23 (×2): qty 1

## 2019-05-23 MED ORDER — ADULT MULTIVITAMIN W/MINERALS CH
1.0000 | ORAL_TABLET | Freq: Every day | ORAL | Status: DC
  Administered 2019-05-23 – 2019-05-24 (×2): 1 via ORAL
  Filled 2019-05-23 (×2): qty 1

## 2019-05-23 MED ORDER — PROMETHAZINE HCL 25 MG RE SUPP: 25.0000 mg | Freq: Four times a day (QID) | RECTAL | 1 refills | Status: DC | PRN

## 2019-05-23 NOTE — Progress Notes (Signed)
Patient ID: Martin Curry, male   DOB: [birth year], 86 y.o.   MRN: [id number] Subjective: Doing well, feels great, minimal pain.  Objective: Vital signs in last 24 hours: Temp:  [97.3 F (36.3 C)-98.1 F (36.7 C)] 98.1 F (36.7 C) (03/30 0559) Pulse Rate:  [64-95] 66 (03/30 0559) Resp:  [16-22] 16 (03/30 0559) BP: (106-132)/(65-80) 114/69 (03/30 0559) SpO2:  [92 %-98 %] 95 % (03/30 0559) Weight change:     Intake/Output from previous day: 03/29 0701 - 03/30 0700 In: 1360 [P.O.:360; I.V.:1000] Out: 734 [Urine:600; Drains:59; Blood:75] Intake/Output this shift: No intake/output data recorded.  PHYSICAL EXAM: He is awake and alert.  Breathing and speech are clear.  Facial nerve function there is about 50% weakness in all branches on the right.  Everything is moving however.  The eye closes almost completely.  Incisions are intact without any signs of necrosis or infection.  Drain is functioning with serous drainage.  Lab Results: No results for input(s): WBC, HGB, HCT, PLT in the last 72 hours. BMET No results for input(s): NA, K, CL, CO2, GLUCOSE, BUN, CREATININE, CALCIUM in the last 72 hours.  Studies/Results: No results found.  Medications: I have reviewed the patient's current medications.  Assessment/Plan: Stable postop.  Facial nerve weakness as expected.  Continue hospital care, anticipate drain removal and discharge tomorrow.  LOS: 1 day   Izora Gala 05/23/2019, 9:36 AM

## 2019-05-23 NOTE — Discharge Instructions (Signed)
Apply antibiotic ointment to the surgical site twice daily.  Keep a dressing on if there is drainage but if there is no drainage you do not have to keep dressing on it.  Avoid any strenuous activity for the next few weeks.  You may use Tylenol/Motrin as needed for pain or you may use the prescription medicine.

## 2019-05-24 NOTE — Progress Notes (Signed)
Pt discharged home in stable condition after going over discharge teaching with no concerns voiced 

## 2019-05-24 NOTE — TOC Progression Note (Addendum)
Transition of Care [facility]) - Progression Note    Patient Details  Name: Martin Curry MRN: [id number] Date of Birth: Apr 04, 1932  Transition [facility]) CM/SW Contact  Jacalyn Lefevre Edson Snowball, RN Phone Number: 05/24/2019, 2:46 PM  Clinical Narrative:     Patient from home. Prior to admission he had a Home health RN . [place] with [facility] called. Messaged Dr Redmond Baseman for resumption of care orders   Called Dr Redmond Baseman office , spoke with Margarita Grizzle regarding resumption of care orders for [facility] , she will message Dr Redmond Baseman   Expected Discharge Plan: [place] Barriers to Discharge: No Barriers Identified  Expected Discharge Plan and Services Expected Discharge Plan: [place]   Discharge Planning Services: CM Consult Post Acute Care Choice: [place] arrangements for the past 2 months: Single Family Home Expected Discharge Date: 05/24/19                 DME Agency: NA                   Social Determinants of Health ([place]) Interventions    Readmission Risk Interventions No flowsheet data found.

## 2019-05-24 NOTE — Discharge Summary (Signed)
Physician Discharge Summary  Patient ID: Martin Curry MRN: [id number] DOB/AGE: 1932/09/02 86 y.o.  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  He presented for further resection and closure.  See operative note.  He was observed in the hospital for two nights following surgery with a drain in place.  Eye care was initiated for moisturization of the right eye due to facial weakness.  On POD 2, she was felt stable for discharge following drain removal.  Consults: None  Significant Diagnostic Studies: None  Treatments: surgery: Right parotidectomy, limited neck dissection, and complex wound closure  Discharge Exam: Blood pressure 129/76, pulse 73, temperature 97.7 F (36.5 C), temperature source Oral, resp. rate 18, height 5\' 11"  (1.803 m), weight 89.8 kg, SpO2 97 %. General appearance: alert, cooperative and no distress Neck: right parotid/neck incision clean and intact, drain removed, right facial weakness of all divisions, unable to close right eye without significant effort  Disposition: Discharge disposition: 01-Home or Self Care       Discharge Instructions    Diet - low sodium heart healthy   Complete by: As directed    Discharge instructions   Complete by: As directed    Use artificial tears in right eye at least hourly while awake.  Use lube in right eye at bedtime and/or tape eye closed.  Apply antibiotic ointment to incision twice daily.  Change dry dressing on lower incision/drain site as needed until drainage stops then OK to allow incision to get wet.  Gently pat dry.   Increase activity slowly   Complete by: As directed      Allergies as of 05/24/2019      Reactions    Penicillins    Did it involve swelling of the face/tongue/throat, SOB, or low BP? Unknown Did it involve sudden or severe rash/hives, skin peeling, or any reaction on the inside of your mouth or nose? Unknown Did you need to seek medical attention at a hospital or doctor's office? Unknown When did it last happen?Childhood allergy If all above answers are "NO", may proceed with cephalosporin use.   Latex Rash   Tape Rash   Skin gets red and a rash develops with "plastic tape"      Medication List    TAKE these medications   amLODipine 5 MG tablet Commonly known as: NORVASC Take 5 mg by mouth daily.   aspirin 81 MG tablet Take 81 mg by mouth daily.   benazepril 10 MG tablet Commonly known as: LOTENSIN Take 10 mg by mouth daily.   HYDROcodone-acetaminophen 7.5-325 MG tablet Commonly known as: Norco Take 1 tablet by mouth every 6 (six) hours as needed for moderate pain.   levothyroxine 25 MCG tablet Commonly known as: SYNTHROID Take 25 mcg by mouth daily before breakfast.   multivitamin tablet Take 1 tablet by mouth daily.   potassium citrate 10 MEQ (1080 MG) SR tablet Commonly known as: UROCIT-K Take 10 mEq by mouth 3 (three) times daily with meals.   promethazine 25 MG suppository Commonly known as: PHENERGAN Place 1 suppository (25 mg total) rectally every 6 (six) hours as needed for nausea or vomiting.   simvastatin 20 MG tablet Commonly known as: ZOCOR Take 20 mg by mouth every  evening.      Follow-up Information    Izora Gala, MD. Schedule an appointment as soon as possible for a visit in 1 week(s).   Specialty: Otolaryngology Contact information: [address] [place] [address] [phone]           Signed: Melida Quitter 05/24/2019, 2:09 PM

## 2019-05-25 ENCOUNTER — Telehealth: Payer: Self-pay | Admitting: Plastic Surgery

## 2019-05-25 LAB — SURGICAL PATHOLOGY

## 2019-05-25 NOTE — Telephone Encounter (Signed)
[place] from [facility] called to get orders for this patient. VM was a little unclear on the type of orders she wanted. Her call back number is [phone]

## 2019-05-25 NOTE — Telephone Encounter (Signed)
Returned [employer] She needed an verbal order for resumption of care an RN services. Dr. Claudia Desanctis was ok, as long as he needed service. [place] has not seen the patient. When the nurse goes out to check on him and feels that he no longer needs care, they will discharge him

## 2019-05-29 ENCOUNTER — Other Ambulatory Visit: Payer: Self-pay

## 2019-05-29 NOTE — Progress Notes (Signed)
A user error has taken place: encounter opened in error, closed for administrative reasons.

## 2019-05-31 ENCOUNTER — Other Ambulatory Visit: Payer: Self-pay

## 2019-05-31 ENCOUNTER — Ambulatory Visit (INDEPENDENT_AMBULATORY_CARE_PROVIDER_SITE_OTHER): Payer: PPO | Admitting: Plastic Surgery

## 2019-05-31 ENCOUNTER — Encounter: Payer: Self-pay | Admitting: Plastic Surgery

## 2019-05-31 VITALS — BP 160/84 | HR 76 | Temp 98.4°F | Wt 198.0 lb

## 2019-05-31 DIAGNOSIS — C4441 Basal cell carcinoma of skin of scalp and neck: Secondary | ICD-10-CM

## 2019-05-31 NOTE — Progress Notes (Signed)
Patient is here postop from excision of basal cell carcinoma in the right neck along with superficial parotidectomy.  I performed a complex closure at the end of the case.  Patient overall feels like he is doing well.  He does have some facial nerve weakness on that side but everything can be activated just with less strength compared to the left side.  His pain has been well controlled.  He does have to work harder to close his right eye but he can close it completely.  He has been using eyedrops and does not report any eye irritation or vision disturbances.  On exam his incision looks to be healing fine without any problems.  He did have positive margins on his pathology and will likely require radiation once he is healed up a bit more.  I am planning to see him again in 2 to 3 weeks.

## 2019-06-14 NOTE — Progress Notes (Signed)
Mr. Stull presents today for follow up with Dr. Isidore Moos and then CT simulation afterwards.   Histology and Location of Primary Skin Cancer: Basal cell carcinoma of RIGHT neck   Past/Anticipated interventions by patient's surgeon/dermatologist for current problematic lesion, if any: 05/22/2019: --Dr. Ria Bush (ENT/Neurosurgery) PROCEDURE:  Procedure(s): 1.  Limited NECK DISSECTION 2.  Superficial PAROTIDECTOMY with facial nerve dissection  3.  COMPLEX CLOSURE OF NECK WOUND --Dr. Mingo Amber (Plastic Surgeon) 1.  Debridement of right neck Mohs surgery defect skin and subcutaneous tissue totaling 3 x 4 cm 2.  Complex closure of Mohs surgery defect and modified neck dissection totaling 18 cm in length  Past skin cancers, if any: Yes, multiple areas excised in the past that were nonmelanoma.   SAFETY ISSUES:  Prior radiation? Yes: 1999--Prostate Fossa (Dr. Sondra Come)  Pacemaker/ICD? No  Possible current pregnancy?N/A  Is the patient on methotrexate? No  Current Complaints / other details:   F/U with Dr. Silverio Lay 05/31/2019 "Patient overall feels like he is doing well.  He does have some facial nerve weakness on that side but everything can be activated just with less strength compared to the left side.  His pain has been well controlled.  He does have to work harder to close his right eye but he can close it completely.  He has been using eyedrops and does not report any eye irritation or vision disturbances."

## 2019-06-14 NOTE — Progress Notes (Signed)
Oncology Nurse Navigator Documentation   Placed introductory call to patient .....  Introduced myself as the the new H&N oncology nurse navigator that works with Dr. Isidore Moos.  Briefly explained my role as his navigator, provided my contact information.        I Confirmed his understanding of upcoming appts and [place] location, explained arrival and registration process. He has been scheduled to see Dr. Isidore Moos on 06/16/19 at 10:40 and CT simulation at 11:00.   I encouraged him to call with questions/concerns as he moves forward with appts and procedures.    He verbalized understanding of information provided, expressed appreciation for my call.   Navigator Initial Assessment . Employment Status: Retired . Currently on FMLA / STD: No . Living Situation: He lives with his wife Vira Agar . Support System: self/wife . PCP: . PCD: . Financial Concerns: no . Transportation Needs: no . Sensory Deficits: He is blind in his left eye. . Language Barriers/Interpreter Needed:  no . Ambulation Needs: no . DME Used in Home: no . Psychosocial Needs:  no . Concerns/Needs Understanding Cancer:  addressed/answered by navigator to best of ability Self-Expressed Needs: no   Harlow Asa RN, BSN, OCN Head & Neck Oncology Nurse [place] at [facility] Phone # [phone]  Fax # [phone]

## 2019-06-15 ENCOUNTER — Ambulatory Visit (INDEPENDENT_AMBULATORY_CARE_PROVIDER_SITE_OTHER): Payer: PPO | Admitting: Plastic Surgery

## 2019-06-15 ENCOUNTER — Other Ambulatory Visit: Payer: Self-pay

## 2019-06-15 DIAGNOSIS — C4441 Basal cell carcinoma of skin of scalp and neck: Secondary | ICD-10-CM

## 2019-06-15 NOTE — Progress Notes (Signed)
Patient is here postop from a excision of a basal cell in the right posterior auricular area.  We did a superficial parotidectomy and complex closure.  He feels like he is doing well.  He feels like his strength on the right side of his face is gradually improving.  Today he can fully close his eye with less effort than it took him previously.  He has been doing eyedrops every hour while awake during the day.  On exam his incision looks to be healing okay with no breakdown.  His earlobe is quite swollen but is going down.  He has no signs of scleral irritation in his right eye.  I believe he has more facial animation on that side than he did at his last visit.  He should start radiation in the next few weeks.  I am planning to see him again in 6 to 8 weeks from now.  I have given him things to look for in terms of signs of eye irritation but given his progress and lack of extreme irritation at this point I do not think it would be justified to do anything surgically to protect his eye as he seems to be doing that on his own.  I have told him to call me if he has any questions about this though.

## 2019-06-16 ENCOUNTER — Other Ambulatory Visit: Payer: Self-pay

## 2019-06-16 ENCOUNTER — Ambulatory Visit
Admission: RE | Admit: 2019-06-16 | Discharge: 2019-06-16 | Disposition: A | Discharge status: Home / Self Care | Payer: PPO | Source: Ambulatory Visit | Attending: Radiation Oncology | Admitting: Radiation Oncology

## 2019-06-16 ENCOUNTER — Encounter: Payer: Self-pay | Admitting: Radiation Oncology

## 2019-06-16 VITALS — BP 150/77 | HR 68 | Temp 98.0°F | Resp 20 | Ht 71.0 in | Wt 199.1 lb

## 2019-06-16 DIAGNOSIS — C4441 Basal cell carcinoma of skin of scalp and neck: Secondary | ICD-10-CM | POA: Diagnosis not present

## 2019-06-16 NOTE — Progress Notes (Signed)
Oncology Nurse Navigator Documentation  I met with the patient and his daughter during a follow up visit with Dr. Isidore Moos to discuss his treatment plan for radiation.  Dr. Isidore Moos discussed side effects and options for treatment. [name] and his daughter had multiple questions which were answered by Dr. Isidore Moos to their satisfaction.  [name] was then escorted to [place] for radiation planning, but unfortunately he was past his appointment time and they were unable to complete the CT simulation. He has been rescheduled for Monday 4/26. I will meet with them at that time to help facilitate his appointment.   Harlow Asa RN, BSN, OCN Head & Neck Oncology Nurse [place] at [facility] Phone # [phone]  Fax # [phone]

## 2019-06-19 ENCOUNTER — Ambulatory Visit
Admission: RE | Admit: 2019-06-19 | Discharge: 2019-06-19 | Disposition: A | Discharge status: Home / Self Care | Payer: PPO | Source: Ambulatory Visit | Attending: Radiation Oncology | Admitting: Radiation Oncology

## 2019-06-19 ENCOUNTER — Other Ambulatory Visit: Payer: Self-pay

## 2019-06-19 DIAGNOSIS — C4441 Basal cell carcinoma of skin of scalp and neck: Secondary | ICD-10-CM | POA: Diagnosis not present

## 2019-06-19 NOTE — Progress Notes (Signed)
Oncology Nurse Navigator Documentation  To provide support, encouragement and care continuity, met with Mr. Beeck during his CT SIM. He was accompanied by his daughter.  He tolerated procedure without difficulty, denied questions/concerns.   I toured him to the radiation treatment area, explained procedures for lobby registration, arrival to Radiation Waiting, arrival to tmt area and preparation for tmt.  He voiced understanding.   I encouraged him to call with any questions/concerns prior to 06/28/19 [place].  Harlow Asa RN, BSN, OCN Head & Neck Oncology Nurse [place] at [facility] Phone # [phone]  Fax # [phone]

## 2019-06-20 ENCOUNTER — Encounter: Payer: Self-pay | Admitting: Radiation Oncology

## 2019-06-20 ENCOUNTER — Other Ambulatory Visit: Payer: Self-pay | Admitting: Radiation Oncology

## 2019-06-20 DIAGNOSIS — C4441 Basal cell carcinoma of skin of scalp and neck: Secondary | ICD-10-CM

## 2019-06-20 NOTE — Progress Notes (Signed)
Radiation Oncology         [phone] ________________________________  Name: Martin Curry MRN: [id number]  Date: 06/16/2019  DOB: 28-Dec-1932  Follow-Up Visit Note  CC: Leanna Battles, MD  Cindra Presume, MD  Diagnosis and Prior Radiotherapy:       ICD-10-CM   1. Basal cell carcinoma, scalp/neck  C44.41    Cancer Staging Basal cell carcinoma (BCC) of skin of neck Staging form: Cutaneous Carcinoma of the Head and Neck, AJCC 8th Edition - Clinical stage from 05/02/2019: Stage III (cT3, cN0, cM0) - Signed by Eppie Gibson, MD on 05/03/2019 - Pathologic stage from 06/16/2019: Stage III (pT3, pN1, cM0) - Signed by Eppie Gibson, MD on 06/20/2019   CHIEF COMPLAINT:  Here for follow-up and surveillance of neck cancer  Narrative:  The patient returns today for routine follow-up.    Recently, we discussed him at our tumor board.  According to Dr. Constance Holster, patient is ready for RT planning. He stated that the patient has positive margins but he debulked the tumor as much as he could.  On 05/22/2019 Dr. Constance Holster performed surgical resection including limited right neck dissection, superficial parotidectomy with facial nerve dissection; Dr. Claudia Desanctis helped with closure of his neck wound; pathology revealed:  A. SOFT TISSUE MASS, LATERAL TO CERVICAL SPINE, EXCISION:  - Basal cell carcinoma   B. PARTOID, RIGHT AND RIGHT NECK DISSECTION, PAROTIDECTOMY:  - Basal cell carcinoma, 1.5 cm, involving parotid gland and adjacent  fibroadipose tissue  - Soft tissue margins and inked cauterized margin at the tissue lateral  to cervical spine are widely positive for invasive carcinoma  - Lymphovascular and perineural invasion is present  - Focus of metastatic carcinoma (0.6 cm) involving a lymph node (1/1)  - Previous procedure-related changes   Postoperatively the patient has some right facial nerve weakness.  His pain is well controlled.  He has been using eyedrops.    He presents with his daughter  today.  ALLERGIES:  is allergic to penicillins; latex; and tape.  Meds: Current Outpatient Medications  Medication Sig Dispense Refill  . amLODipine (NORVASC) 5 MG tablet Take 5 mg by mouth daily.     [name] Kitchen aspirin 81 MG tablet Take 81 mg by mouth daily.    . benazepril (LOTENSIN) 10 MG tablet Take 10 mg by mouth daily.    [name] Kitchen levothyroxine (SYNTHROID) 25 MCG tablet Take 25 mcg by mouth daily before breakfast.    . Multiple Vitamin (MULTIVITAMIN) tablet Take 1 tablet by mouth daily.    . potassium citrate (UROCIT-K) 10 MEQ (1080 MG) SR tablet Take 10 mEq by mouth 3 (three) times daily with meals.    . simvastatin (ZOCOR) 20 MG tablet Take 20 mg by mouth every evening.    [name] Kitchen HYDROcodone-acetaminophen (NORCO) 7.5-325 MG tablet Take 1 tablet by mouth every 6 (six) hours as needed for moderate pain. (Patient not taking: Reported on 05/31/2019) 20 tablet 0  . promethazine (PHENERGAN) 25 MG suppository Place 1 suppository (25 mg total) rectally every 6 (six) hours as needed for nausea or vomiting. (Patient not taking: Reported on 05/31/2019) 12 suppository 1   No current facility-administered medications for this encounter.    Physical Findings: The patient is in no acute distress. Patient is alert and oriented. Wt Readings from Last 3 Encounters:  06/16/19 199 lb 2 oz (90.3 kg)  05/31/19 198 lb (89.8 kg)  05/22/19 198 lb (89.8 kg)    height is 5\' 11"  (1.803 m) and weight is  199 lb 2 oz (90.3 kg). His temporal temperature is 98 F (36.7 C). His blood pressure is 150/77 (abnormal) and his pulse is 68. His respiration is 20 and oxygen saturation is 97%. .  General: Alert and oriented, in no acute distress.  Pleasant to speak with HEENT: He presents with erythema and swelling of the right ear ; there is a long incision extending from behind the right ear and down the right neck which is intact and healing well.  He presents with right facial weakness but not a full facial droop.  Mouth is moist with no  oral thrush  Skin: As above  Neck as above  Psychiatric: Judgment and insight are intact. Affect is appropriate.  ECOG: 2  Lab Findings: Lab Results  Component Value Date   WBC 8.3 05/16/2019   HGB 16.3 05/16/2019   HCT 48.4 05/16/2019   MCV 95.3 05/16/2019   PLT 278 05/16/2019   CMP Latest Ref Rng & Units 05/16/2019 04/21/2019 11/18/2011  Glucose 70 - 99 mg/dL 103(H) - 89  BUN 8 - 23 mg/dL 15 - 17.0  Creatinine 0.61 - 1.24 mg/dL 1.41(H) 1.50(H) 1.3  Sodium 135 - 145 mmol/L 141 - 141  Potassium 3.5 - 5.1 mmol/L 3.9 - 4.3  Chloride 98 - 111 mmol/L 105 - 109(H)  CO2 22 - 32 mmol/L 25 - 25  Calcium 8.9 - 10.3 mg/dL 9.8 - 10.1  Total Protein 6.4 - 8.3 g/dL - - 6.8  Total Bilirubin 0.20 - 1.20 mg/dL - - 0.70  Alkaline Phos 40 - 150 U/L - - 79  AST 5 - 34 U/L - - 32  ALT 0 - 55 U/L - - 48     No results found for: TSH  Radiographic Findings: I once again reviewed his preoperative CT scans  Impression/Plan:    This is a delightful 86 year old gentleman with a history of locally advanced basal cell carcinoma of the neck.  He had a bulky tumor and now presents postoperatively with positive margins.  At least one lymph node was involved.  There is lymphovascular invasion and perineural invasion.  Gross disease lateral to the cervical spine could not be fully resected.  He would benefit from adjuvant radiotherapy for locoregional control.  We discussed the option of receiving 6 to 6 and half weeks of standard fractionation radiotherapy versus a 4-week hypofractionated treatment plan.  We discussed the risks benefits and side effects of both approaches.  I believe that given the patient's age and other medical issues that he would best tolerate a 4-week course and be more likely to complete this course without breaks or major acute side effects.  He understands that a higher dose would be given per day to procure a robust radiobiologic effect upon his cancer.  He and his daughter are  enthusiastic about this plan.  We again discussed the potential risks benefits and side effects of radiotherapy which may include but not necessarily be limited to fatigue, skin irritation, skin breakdown, weight loss, dehydration, possible nonhealing wounds, hair loss, dysgeusia, mucositis, pain, fibrosis, scar tissue, xerostomia, injury to tissues in the irradiated field.  A consent form has been signed today.  We will proceed with CT simulation in the near future.  I will also make a referral to our nutritionist in case the patient develops dysgeusia.  He understands that hydration and good nutrition will be vital to best tolerance of treatment and avoidance of hospitalization.   We discussed measures to reduce the  risk of infection during the COVID-19 pandemic.   On date of service, in total, I spent 30 minutes on this encounter.  Patient was seen face-to-face.  His daughter was present as well as Anderson Malta our patient navigator. _____________________________________   Eppie Gibson, MD

## 2019-06-26 DIAGNOSIS — C4441 Basal cell carcinoma of skin of scalp and neck: Secondary | ICD-10-CM | POA: Diagnosis not present

## 2019-06-28 ENCOUNTER — Ambulatory Visit
Admission: RE | Admit: 2019-06-28 | Discharge: 2019-06-28 | Disposition: A | Discharge status: Home / Self Care | Payer: PPO | Source: Ambulatory Visit | Attending: Radiation Oncology | Admitting: Radiation Oncology

## 2019-06-28 ENCOUNTER — Other Ambulatory Visit: Payer: Self-pay

## 2019-06-28 DIAGNOSIS — C4441 Basal cell carcinoma of skin of scalp and neck: Secondary | ICD-10-CM | POA: Diagnosis not present

## 2019-06-29 ENCOUNTER — Other Ambulatory Visit: Payer: Self-pay

## 2019-06-29 ENCOUNTER — Ambulatory Visit
Admission: RE | Admit: 2019-06-29 | Discharge: 2019-06-29 | Disposition: A | Discharge status: Home / Self Care | Payer: PPO | Source: Ambulatory Visit | Attending: Radiation Oncology | Admitting: Radiation Oncology

## 2019-06-29 DIAGNOSIS — C4441 Basal cell carcinoma of skin of scalp and neck: Secondary | ICD-10-CM | POA: Diagnosis not present

## 2019-06-30 ENCOUNTER — Other Ambulatory Visit: Payer: Self-pay

## 2019-06-30 ENCOUNTER — Ambulatory Visit
Admission: RE | Admit: 2019-06-30 | Discharge: 2019-06-30 | Disposition: A | Discharge status: Home / Self Care | Payer: PPO | Source: Ambulatory Visit | Attending: Radiation Oncology | Admitting: Radiation Oncology

## 2019-06-30 DIAGNOSIS — C4441 Basal cell carcinoma of skin of scalp and neck: Secondary | ICD-10-CM | POA: Diagnosis not present

## 2019-07-03 ENCOUNTER — Other Ambulatory Visit: Payer: Self-pay

## 2019-07-03 ENCOUNTER — Ambulatory Visit
Admission: RE | Admit: 2019-07-03 | Discharge: 2019-07-03 | Disposition: A | Discharge status: Home / Self Care | Payer: PPO | Source: Ambulatory Visit | Attending: Radiation Oncology | Admitting: Radiation Oncology

## 2019-07-03 ENCOUNTER — Inpatient Hospital Stay: Payer: PPO | Attending: Radiation Oncology | Admitting: Nutrition

## 2019-07-03 DIAGNOSIS — C4441 Basal cell carcinoma of skin of scalp and neck: Secondary | ICD-10-CM | POA: Diagnosis not present

## 2019-07-03 MED ORDER — SONAFINE EX EMUL
1.0000 "application " | Freq: Two times a day (BID) | CUTANEOUS | Status: DC
  Administered 2019-07-03: 1 via TOPICAL

## 2019-07-03 NOTE — Progress Notes (Signed)
86 year old male diagnosed with basal cell carcinoma on the skin of his neck.  He is receiving 20 fractions of radiation therapy.  He is followed by Dr. Isidore Moos.  Past medical history includes prostate cancer, hypertension, hypothyroidism, and blindness in left eye.  Medications include Synthroid, multivitamin, Phenergan, and Zocor.  Labs include glucose 103 and creatinine 1.4.  Height: 5 feet 11 inches. Weight: 199 pounds April 23. Usual body weight: Approximately 200 pounds. BMI: 27.77.  Patient denies difficulty chewing and swallowing however has problems opening his mouth wide.  He does report he can chew and swallow most anything as long as he takes small bites. He has some taste alterations and reports metallic taste. He also is expecting to have more dry mouth.  Nutrition diagnosis: Food and nutrition related knowledge deficit related to cancer and associated treatments as evidenced by no prior need for nutrition related information.  Intervention: Educated patient to consume smaller more frequent meals and snacks with adequate calories and protein for weight maintenance. Provided education on taste alterations and dry mouth. Reviewed high-protein foods. Provided fact sheets.  Questions were answered and teach back method used.  I gave patient my contact information.  Monitoring, evaluation, goals: Patient will tolerate adequate calories and protein to minimize weight loss.  Next visit: Patient prefers to call me with questions or concerns so no follow-up has been scheduled.  **Disclaimer: This note was dictated with voice recognition software. Similar sounding words can inadvertently be transcribed and this note may contain transcription errors which may not have been corrected upon publication of note.**

## 2019-07-03 NOTE — Progress Notes (Signed)
Pt here for patient teaching.    Pt given Radiation and You booklet, skin care instructions and Sonafine.    Reviewed areas of pertinence such as fatigue, hair loss, skin changes and taste changes .   Pt able to give teach back of to pat skin, use unscented/gentle soap and drink plenty of water,apply Sonafine bid, avoid applying anything to skin within 4 hours of treatment and to use an electric razor if they must shave.   Pt demonstrated understanding and verbalizes understanding of information given and will contact nursing with any questions or concerns.    [email or web address]/whattoexpect/index

## 2019-07-04 ENCOUNTER — Other Ambulatory Visit: Payer: Self-pay

## 2019-07-04 ENCOUNTER — Ambulatory Visit
Admission: RE | Admit: 2019-07-04 | Discharge: 2019-07-04 | Disposition: A | Discharge status: Home / Self Care | Payer: PPO | Source: Ambulatory Visit | Attending: Radiation Oncology | Admitting: Radiation Oncology

## 2019-07-04 DIAGNOSIS — C4441 Basal cell carcinoma of skin of scalp and neck: Secondary | ICD-10-CM | POA: Diagnosis not present

## 2019-07-05 ENCOUNTER — Ambulatory Visit
Admission: RE | Admit: 2019-07-05 | Discharge: 2019-07-05 | Disposition: A | Discharge status: Home / Self Care | Payer: PPO | Source: Ambulatory Visit | Attending: Radiation Oncology | Admitting: Radiation Oncology

## 2019-07-05 ENCOUNTER — Other Ambulatory Visit: Payer: Self-pay

## 2019-07-05 DIAGNOSIS — C4441 Basal cell carcinoma of skin of scalp and neck: Secondary | ICD-10-CM | POA: Diagnosis not present

## 2019-07-06 ENCOUNTER — Ambulatory Visit
Admission: RE | Admit: 2019-07-06 | Discharge: 2019-07-06 | Disposition: A | Discharge status: Home / Self Care | Payer: PPO | Source: Ambulatory Visit | Attending: Radiation Oncology | Admitting: Radiation Oncology

## 2019-07-06 ENCOUNTER — Other Ambulatory Visit: Payer: Self-pay

## 2019-07-06 DIAGNOSIS — C4441 Basal cell carcinoma of skin of scalp and neck: Secondary | ICD-10-CM | POA: Diagnosis not present

## 2019-07-07 ENCOUNTER — Ambulatory Visit
Admission: RE | Admit: 2019-07-07 | Discharge: 2019-07-07 | Disposition: A | Discharge status: Home / Self Care | Payer: PPO | Source: Ambulatory Visit | Attending: Radiation Oncology | Admitting: Radiation Oncology

## 2019-07-07 ENCOUNTER — Other Ambulatory Visit: Payer: Self-pay

## 2019-07-07 DIAGNOSIS — C4441 Basal cell carcinoma of skin of scalp and neck: Secondary | ICD-10-CM | POA: Diagnosis not present

## 2019-07-10 ENCOUNTER — Ambulatory Visit
Admission: RE | Admit: 2019-07-10 | Discharge: 2019-07-10 | Disposition: A | Discharge status: Home / Self Care | Payer: PPO | Source: Ambulatory Visit | Attending: Radiation Oncology | Admitting: Radiation Oncology

## 2019-07-10 ENCOUNTER — Other Ambulatory Visit: Payer: Self-pay

## 2019-07-10 ENCOUNTER — Other Ambulatory Visit: Payer: Self-pay | Admitting: Radiation Oncology

## 2019-07-10 DIAGNOSIS — C4441 Basal cell carcinoma of skin of scalp and neck: Secondary | ICD-10-CM | POA: Diagnosis not present

## 2019-07-10 DIAGNOSIS — K219 Gastro-esophageal reflux disease without esophagitis: Secondary | ICD-10-CM

## 2019-07-10 MED ORDER — OMEPRAZOLE 20 MG PO CPDR: 20.0000 mg | DELAYED_RELEASE_CAPSULE | Freq: Every day | ORAL | 0 refills | Status: DC

## 2019-07-11 ENCOUNTER — Ambulatory Visit
Admission: RE | Admit: 2019-07-11 | Discharge: 2019-07-11 | Disposition: A | Discharge status: Home / Self Care | Payer: PPO | Source: Ambulatory Visit | Attending: Radiation Oncology | Admitting: Radiation Oncology

## 2019-07-11 ENCOUNTER — Other Ambulatory Visit: Payer: Self-pay

## 2019-07-11 DIAGNOSIS — C4441 Basal cell carcinoma of skin of scalp and neck: Secondary | ICD-10-CM | POA: Diagnosis not present

## 2019-07-12 ENCOUNTER — Ambulatory Visit
Admission: RE | Admit: 2019-07-12 | Discharge: 2019-07-12 | Disposition: A | Discharge status: Home / Self Care | Payer: PPO | Source: Ambulatory Visit | Attending: Radiation Oncology | Admitting: Radiation Oncology

## 2019-07-12 DIAGNOSIS — C4441 Basal cell carcinoma of skin of scalp and neck: Secondary | ICD-10-CM | POA: Diagnosis not present

## 2019-07-13 ENCOUNTER — Ambulatory Visit
Admission: RE | Admit: 2019-07-13 | Discharge: 2019-07-13 | Disposition: A | Discharge status: Home / Self Care | Payer: PPO | Source: Ambulatory Visit | Attending: Radiation Oncology | Admitting: Radiation Oncology

## 2019-07-13 ENCOUNTER — Other Ambulatory Visit: Payer: Self-pay

## 2019-07-13 DIAGNOSIS — C4441 Basal cell carcinoma of skin of scalp and neck: Secondary | ICD-10-CM | POA: Diagnosis not present

## 2019-07-14 ENCOUNTER — Ambulatory Visit
Admission: RE | Admit: 2019-07-14 | Discharge: 2019-07-14 | Disposition: A | Discharge status: Home / Self Care | Payer: PPO | Source: Ambulatory Visit | Attending: Radiation Oncology | Admitting: Radiation Oncology

## 2019-07-14 ENCOUNTER — Other Ambulatory Visit: Payer: Self-pay

## 2019-07-14 DIAGNOSIS — C4441 Basal cell carcinoma of skin of scalp and neck: Secondary | ICD-10-CM | POA: Diagnosis not present

## 2019-07-17 ENCOUNTER — Ambulatory Visit
Admission: RE | Admit: 2019-07-17 | Discharge: 2019-07-17 | Disposition: A | Discharge status: Home / Self Care | Payer: PPO | Source: Ambulatory Visit | Attending: Radiation Oncology | Admitting: Radiation Oncology

## 2019-07-17 ENCOUNTER — Other Ambulatory Visit: Payer: Self-pay | Admitting: Radiation Oncology

## 2019-07-17 ENCOUNTER — Other Ambulatory Visit: Payer: Self-pay

## 2019-07-17 DIAGNOSIS — C4441 Basal cell carcinoma of skin of scalp and neck: Secondary | ICD-10-CM | POA: Diagnosis not present

## 2019-07-17 MED ORDER — LIDOCAINE VISCOUS HCL 2 % MT SOLN: OROMUCOSAL | 3 refills | Status: DC

## 2019-07-18 ENCOUNTER — Other Ambulatory Visit: Payer: Self-pay

## 2019-07-18 ENCOUNTER — Ambulatory Visit
Admission: RE | Admit: 2019-07-18 | Discharge: 2019-07-18 | Disposition: A | Discharge status: Home / Self Care | Payer: PPO | Source: Ambulatory Visit | Attending: Radiation Oncology | Admitting: Radiation Oncology

## 2019-07-18 DIAGNOSIS — C4441 Basal cell carcinoma of skin of scalp and neck: Secondary | ICD-10-CM | POA: Diagnosis not present

## 2019-07-19 ENCOUNTER — Ambulatory Visit
Admission: RE | Admit: 2019-07-19 | Discharge: 2019-07-19 | Disposition: A | Discharge status: Home / Self Care | Payer: PPO | Source: Ambulatory Visit | Attending: Radiation Oncology | Admitting: Radiation Oncology

## 2019-07-19 ENCOUNTER — Other Ambulatory Visit: Payer: Self-pay

## 2019-07-19 DIAGNOSIS — C4441 Basal cell carcinoma of skin of scalp and neck: Secondary | ICD-10-CM | POA: Diagnosis not present

## 2019-07-20 ENCOUNTER — Other Ambulatory Visit: Payer: Self-pay

## 2019-07-20 ENCOUNTER — Ambulatory Visit
Admission: RE | Admit: 2019-07-20 | Discharge: 2019-07-20 | Disposition: A | Discharge status: Home / Self Care | Payer: PPO | Source: Ambulatory Visit | Attending: Radiation Oncology | Admitting: Radiation Oncology

## 2019-07-20 DIAGNOSIS — C4441 Basal cell carcinoma of skin of scalp and neck: Secondary | ICD-10-CM | POA: Diagnosis not present

## 2019-07-21 ENCOUNTER — Other Ambulatory Visit: Payer: Self-pay

## 2019-07-21 ENCOUNTER — Telehealth: Payer: Self-pay | Admitting: *Deleted

## 2019-07-21 ENCOUNTER — Ambulatory Visit
Admission: RE | Admit: 2019-07-21 | Discharge: 2019-07-21 | Disposition: A | Discharge status: Home / Self Care | Payer: PPO | Source: Ambulatory Visit | Attending: Radiation Oncology | Admitting: Radiation Oncology

## 2019-07-21 DIAGNOSIS — C4441 Basal cell carcinoma of skin of scalp and neck: Secondary | ICD-10-CM | POA: Diagnosis not present

## 2019-07-21 NOTE — Telephone Encounter (Signed)
RETURNED PATIENT'S PHONE CALL, SPOKE WITH PATIENT. ?

## 2019-07-25 ENCOUNTER — Ambulatory Visit
Admission: RE | Admit: 2019-07-25 | Discharge: 2019-07-25 | Disposition: A | Discharge status: Home / Self Care | Payer: PPO | Source: Ambulatory Visit | Attending: Radiation Oncology | Admitting: Radiation Oncology

## 2019-07-25 ENCOUNTER — Other Ambulatory Visit: Payer: Self-pay

## 2019-07-25 DIAGNOSIS — C4441 Basal cell carcinoma of skin of scalp and neck: Secondary | ICD-10-CM | POA: Insufficient documentation

## 2019-07-26 ENCOUNTER — Ambulatory Visit
Admission: RE | Admit: 2019-07-26 | Discharge: 2019-07-26 | Disposition: A | Discharge status: Home / Self Care | Payer: PPO | Source: Ambulatory Visit | Attending: Radiation Oncology | Admitting: Radiation Oncology

## 2019-07-26 ENCOUNTER — Encounter: Payer: Self-pay | Admitting: Radiation Oncology

## 2019-07-26 ENCOUNTER — Other Ambulatory Visit: Payer: Self-pay

## 2019-07-26 DIAGNOSIS — C4441 Basal cell carcinoma of skin of scalp and neck: Secondary | ICD-10-CM | POA: Diagnosis not present

## 2019-07-26 NOTE — Progress Notes (Signed)
Oncology Nurse Navigator Documentation  Met with Martin Curry before his final RT to offer support and to celebrate end of radiation treatment.   Provided verbal post-RT guidance:  Importance of protecting treatment area from sun.  Continuation of Sonafine application 2-3 times daily, application of antibiotic ointment to areas of raw skin; when supply of Sonafine exhausted transition to OTC lotion with vitamin E. Provided/reviewed Epic calendar of upcoming appts. Explained my role as navigator will continue for several more months, encouraged him to call me with needs/concerns.     Harlow Asa RN, BSN, OCN Head & Neck Oncology Nurse [place] at [facility] Phone # [phone]  Fax # [phone]

## 2019-08-01 ENCOUNTER — Other Ambulatory Visit: Payer: Self-pay | Admitting: Medical

## 2019-08-01 ENCOUNTER — Inpatient Hospital Stay: Payer: PPO

## 2019-08-01 ENCOUNTER — Inpatient Hospital Stay: Payer: PPO | Attending: Radiation Oncology | Admitting: Medical

## 2019-08-01 ENCOUNTER — Other Ambulatory Visit: Payer: Self-pay

## 2019-08-01 VITALS — BP 136/72 | HR 76 | Temp 98.3°F | Resp 17 | Ht 71.0 in | Wt 184.8 lb

## 2019-08-01 DIAGNOSIS — C07 Malignant neoplasm of parotid gland: Secondary | ICD-10-CM | POA: Diagnosis not present

## 2019-08-01 DIAGNOSIS — R5383 Other fatigue: Secondary | ICD-10-CM

## 2019-08-01 DIAGNOSIS — E860 Dehydration: Secondary | ICD-10-CM | POA: Insufficient documentation

## 2019-08-01 DIAGNOSIS — R630 Anorexia: Secondary | ICD-10-CM

## 2019-08-01 DIAGNOSIS — R110 Nausea: Secondary | ICD-10-CM

## 2019-08-01 DIAGNOSIS — R1314 Dysphagia, pharyngoesophageal phase: Secondary | ICD-10-CM

## 2019-08-01 LAB — COMPREHENSIVE METABOLIC PANEL
ALT: 29 U/L (ref 0–44)
AST: 29 U/L (ref 15–41)
Albumin: 3.8 g/dL (ref 3.5–5.0)
Alkaline Phosphatase: 86 U/L (ref 38–126)
Anion gap: 12 (ref 5–15)
BUN: 19 mg/dL (ref 8–23)
CO2: 24 mmol/L (ref 22–32)
Calcium: 10 mg/dL (ref 8.9–10.3)
Chloride: 102 mmol/L (ref 98–111)
Creatinine, Ser: 1.34 mg/dL — ABNORMAL HIGH (ref 0.61–1.24)
GFR calc Af Amer: 56 mL/min — ABNORMAL LOW (ref >60)
GFR calc non Af Amer: 48 mL/min — ABNORMAL LOW (ref >60)
Glucose, Bld: 154 mg/dL — ABNORMAL HIGH (ref 70–99)
Potassium: 4.1 mmol/L (ref 3.5–5.1)
Sodium: 138 mmol/L (ref 135–145)
Total Bilirubin: 0.9 mg/dL (ref 0.3–1.2)
Total Protein: 6.9 g/dL (ref 6.5–8.1)

## 2019-08-01 LAB — CBC WITH DIFFERENTIAL/PLATELET
Abs Immature Granulocytes: 0.02 10*3/uL (ref 0.00–0.07)
Basophils Absolute: 0 10*3/uL (ref 0.0–0.1)
Basophils Relative: 1 %
Eosinophils Absolute: 0.4 10*3/uL (ref 0.0–0.5)
Eosinophils Relative: 5 %
HCT: 48.2 % (ref 39.0–52.0)
Hemoglobin: 17 g/dL (ref 13.0–17.0)
Immature Granulocytes: 0 %
Lymphocytes Relative: 8 %
Lymphs Abs: 0.6 10*3/uL — ABNORMAL LOW (ref 0.7–4.0)
MCH: 32.2 pg (ref 26.0–34.0)
MCHC: 35.3 g/dL (ref 30.0–36.0)
MCV: 91.3 fL (ref 80.0–100.0)
Monocytes Absolute: 0.5 10*3/uL (ref 0.1–1.0)
Monocytes Relative: 7 %
Neutro Abs: 5.2 10*3/uL (ref 1.7–7.7)
Neutrophils Relative %: 79 %
Platelets: 221 10*3/uL (ref 150–400)
RBC: 5.28 MIL/uL (ref 4.22–5.81)
RDW: 13 % (ref 11.5–15.5)
WBC: 6.7 10*3/uL (ref 4.0–10.5)
nRBC: 0 % (ref 0.0–0.2)

## 2019-08-01 MED ORDER — SODIUM CHLORIDE 0.9 % IV SOLN
Freq: Once | INTRAVENOUS | Status: AC
  Filled 2019-08-01: qty 250

## 2019-08-01 NOTE — Progress Notes (Signed)
Oncology Nurse Navigator Documentation  I received a phone call from Martin Curry, Martin Curry' daughter. She reports that Martin Curry has not been feeling well since completing radiation last week. She reports that he is very weak, spending most of the day in bed. He is having trouble eating and drinking due to taste changes and pain. He is able to eat one small meal a day and 1 ensure with very little water intake. She also reports a rash to his anterior chest and shoulders that is not improving. I notified Dr. Isidore Moos of the above and the recommendation was for Martin Curry to see Martin Mealy PA today in symptom management for evaluation. Martin Curry agreed to see him and the patient will have labs beforehand. I have made his daughter, Martin Curry aware and she verbalized to me that she will have him here for the appointment.   Harlow Asa RN, BSN, OCN Head & Neck Oncology Nurse [place] at [facility] Phone # [phone]  Fax # [phone]

## 2019-08-01 NOTE — Progress Notes (Signed)
Symptoms Management Clinic Progress Note   LONG BRIMAGE [id number] [birth year] 86 y.o.  Martin Curry is managed by Dr. Eppie Gibson  Actively treated with chemotherapy/immunotherapy/hormonal therapy: no  Current therapy: Post radiation therapy  Next scheduled appointment with provider: 06 / 25 /2021  Assessment: Plan:    Dehydration - Plan: 0.9 %  sodium chloride infusion  Cancer of parotid gland ([place])  Pharyngoesophageal dysphagia  CMP demonstrated Cr of 1.34. Dry mucus membranes on physical exam.  Post radiation therapy about a week ago on 07/26/19.   Continue with viscous lidocaine as needed to help swallow food and liquids. Discussed using biotene to relieve mouth pain and discussed other nutrition options such as different types of soups and boosts.  Please see After Visit Summary for patient specific instructions.  Future Appointments  Date Time Provider [place]  08/10/2019  9:30 AM Cindra Presume, MD [facility] None  08/18/2019 10:40 AM Eppie Gibson, MD [facility] None    No orders of the defined types were placed in this encounter.      Subjective:   Patient ID:  Martin Curry is a 86 y.o. (DOB 09-Oct-1932) male.  Chief Complaint: No chief complaint on file.   HPI Martin Curry  is a 86 y.o. male with a diagnosis of basal cell carcinoma of the neck, stage III. The patient completed radiation therapy about one week ago on 07/26/19, and endorses extra fatigue over the past couple of days. He was accompained by his daughter during this visit. The patient complains of dysphagia and has only used his prescribed viscous lidocaine a couple of times to help with pain. The patient states that he was able to drink 2 bottles of water and 2 bottles of ensure yesterday, which he states is the most he has been able to tolerate in the past week. He states that foods do not taste like they did before radiation and the texture of foods has become more  important to him, which can make it challenging in finding foods to eat. He complains of chapped lips, which he attributes to his dehydration. The patient states he has been trying his best to keep up with drinking enough water and nutrients each day, but he had many questions regarding his symptoms and how best to move forward with his care. He denies having an appetite but states that his family continue to make sure he is eating enough each day. The patient also had questions about his rash starting to spread to his back and shoulders.    Medications: I have reviewed the patient's current medications.  Allergies:  Allergies  Allergen Reactions  . Penicillins     Did it involve swelling of the face/tongue/throat, SOB, or low BP? Unknown Did it involve sudden or severe rash/hives, skin peeling, or any reaction on the inside of your mouth or nose? Unknown Did you need to seek medical attention at a hospital or doctor's office? Unknown When did it last happen?Childhood allergy If all above answers are "NO", may proceed with cephalosporin use.   . Latex Rash  . Tape Rash    Skin gets red and a rash develops with "plastic tape" Skin gets red and a rash develops with "plastic tape"    Past Medical History:  Diagnosis Date  . Atypical nevus 09/07/2002   left abdomen-slight  . BCC (basal cell carcinoma of skin) 07/19/2014   left sideburn- +margin-exc.  [name] Kitchen BCC (basal cell carcinoma) 09/07/2002  right preauricular-exc., left crown of scalp-CX35FU  . BCC (basal cell carcinoma) 10/30/2003   right forehead-MOHS  . BCC (basal cell carcinoma) 01/07/2009   left scalp, left forearm  . BCC (basal cell carcinoma) 09/27/2014   left sideburn-free  . BCC (basal cell carcinoma) 07/16/2015   left scalp  . 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 Cancer, skin, squamous cell   . History of kidney stones    Many years ago  . Hyperlipidemia   . Hypertension   . Hypothyroidism   . Nodular basal cell  carcinoma (BCC) 11/21/2012   right side of scalp-CX35FU  . Nodular basal cell carcinoma (BCC) 06/12/2014   left sideburn-CX35FU/exc.  . Nodular basal cell carcinoma (BCC) 07/16/2015   left scalp-CX35FU  . Polycythemia   . Prostate cancer ([place]) 1990's   total prostatectomy and adjuvant radiation. Last PSA was 0.02 in 09/2011  . SCC (squamous cell carcinoma) 04/03/2009   forehead-txpbx  . SCC (squamous cell carcinoma) 07/16/2015   left front scalp  . SCC (squamous cell carcinoma) 07/16/2015   left front scalp-Cx35FU  . Superficial basal cell carcinoma (BCC) 11/21/2012   behind left ear-CX35FU, midback-CX35FU, right forehead-CX35FU-exc  . Superficial basal cell carcinoma (BCC) 11/02/2017   left post neck-CX35FU    Past Surgical History:  Procedure Laterality Date  . ADJACENT TISSUE TRANSFER/TISSUE REARRANGEMENT N/A 05/22/2019   Procedure: COMPLEX CLOSURE OF NECK WOUND;  Surgeon: Cindra Presume, MD;  Location: [facility];  Service: Plastics;  Laterality: N/A;  . ALLOGRAFT APPLICATION N/A 0/62/3762   Procedure: POSSIBLE FACIAL NERVE RECONSTRUCTION WITH NERVE ALLOGRAFT VS AUTOGRAFT;  Surgeon: Cindra Presume, MD;  Location: [facility];  Service: Plastics;  Laterality: N/A;  . BLADDER SURGERY     due to prostatectomy.   [name] Kitchen CATARACT EXTRACTION     right  . COLONOSCOPY     neg in the past; due in 2014 with [place] Dr. Sharlett Iles  . EYE SURGERY     50 years ago.   [name] Kitchen HERNIA REPAIR    . MOHS SURGERY    . NECK DISSECTION  05/22/2019    NECK DISSECTION  . PAROTIDECTOMY  05/22/2019   PAROTIDECTOMY with facial nerve dissection   . PAROTIDECTOMY Right 05/22/2019   Procedure: PAROTIDECTOMY;  Surgeon: Izora Gala, MD;  Location: [facility];  Service: ENT;  Laterality: Right;  . PROSTATE SURGERY    . RADICAL NECK DISSECTION Right 05/22/2019   Procedure: RADICAL NECK DISSECTION;  Surgeon: Izora Gala, MD;  Location: [facility];  Service: ENT;  Laterality: Right;  . TONSILLECTOMY      Family History  Problem  Relation Age of Onset  . Uterine cancer Mother 3  . Lung cancer Father 75  . Prostate cancer Brother   . Prostate cancer Brother     Social History   Socioeconomic History  . Marital status: Married    Spouse name: Not on file  . Number of children: 3  . Years of education: Not on file  . 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 Smoking status: Never Smoker  . Smokeless tobacco: Never Used  Substance and Sexual Activity  . Alcohol use: No  . Drug use: No  . Sexual activity: Not on file  Other Topics Concern  . Not on file  Social History Narrative  . Not on file   Social Determinants of Health   Financial  Resource Strain:   . Difficulty of Paying Living Expenses:   Food Insecurity:   . Worried About Charity fundraiser in the Last Year:   . Arboriculturist in the Last Year:   Transportation Needs:   . Film/video editor (Medical):   [name] Kitchen Lack of Transportation (Non-Medical):   Physical Activity:   . Days of Exercise per Week:   . Minutes of Exercise per Session:   Stress:   . Feeling of Stress :   Social Connections:   . Frequency of Communication with Friends and Family:   . Frequency of Social Gatherings with Friends and Family:   . Attends Religious Services:   . Active Member of Clubs or Organizations:   . Attends Archivist Meetings:   [name] Kitchen Marital Status:   Intimate Partner Violence:   . Fear of Current or Ex-Partner:   . Emotionally Abused:   [name] Kitchen Physically Abused:   . Sexually Abused:     Past Medical History, Surgical history, Social history, and Family history were reviewed and updated as appropriate.   Please see review of systems for further details on the patient's review from today.   Review of Systems:  Review of Systems  Constitutional: Positive for appetite change and fatigue. Negative for chills and fever.  HENT: Positive for ear pain, sore  throat and trouble swallowing. Negative for ear discharge and mouth sores.   Eyes: Negative for pain, discharge, redness and itching.  Respiratory: Negative for cough, chest tightness and shortness of breath.   Cardiovascular: Negative for chest pain, palpitations and leg swelling.  Gastrointestinal: Positive for nausea. Negative for vomiting.  Skin: Positive for color change and rash.    Objective:   Physical Exam:  BP 124/79 (BP Location: Left Arm, Patient Position: Sitting)   Pulse (!) 109   Temp (!) 97.5 F (36.4 C) (Temporal)   Resp 17   Ht 5\' 11"  (1.803 m)   Wt 83.8 kg (184 lb 12.8 oz)   SpO2 97%   BMI 25.77 kg/m  ECOG: 1  Physical Exam Constitutional:      General: He is not in acute distress.    Appearance: He is not ill-appearing.  HENT:     Head: Normocephalic.     Ears:     Comments: Erythematous right external ear canal, limited evaluation due to pain and swelling    Nose: Nose normal.     Mouth/Throat:     Mouth: Mucous membranes are dry.     Pharynx: Posterior oropharyngeal erythema present.  Cardiovascular:     Rate and Rhythm: Normal rate and regular rhythm.     Heart sounds: Normal heart sounds.  Pulmonary:     Effort: Pulmonary effort is normal.     Breath sounds: Normal breath sounds.  Musculoskeletal:     Cervical back: Tenderness present.  Skin:    General: Skin is dry.     Findings: Erythema and rash present.          Comments: Erythematous rash along bilateral scapula  Neurological:     Mental Status: He is alert.     Lab Review:     Component Value Date/Time   NA 138 08/01/2019 1248   NA 141 11/18/2011 1046   K 4.1 08/01/2019 1248   K 4.3 11/18/2011 1046   CL 102 08/01/2019 1248   CL 109 (H) 11/18/2011 1046   CO2 24 08/01/2019 1248   CO2 25 11/18/2011 1046  GLUCOSE 154 (H) 08/01/2019 1248   GLUCOSE 89 11/18/2011 1046   BUN 19 08/01/2019 1248   BUN 17.0 11/18/2011 1046   CREATININE 1.34 (H) 08/01/2019 1248   CREATININE 1.3  11/18/2011 1046   CALCIUM 10.0 08/01/2019 1248   CALCIUM 10.1 11/18/2011 1046   PROT 6.9 08/01/2019 1248   PROT 6.8 11/18/2011 1046   ALBUMIN 3.8 08/01/2019 1248   ALBUMIN 4.1 11/18/2011 1046   AST 29 08/01/2019 1248   AST 32 11/18/2011 1046   ALT 29 08/01/2019 1248   ALT 48 11/18/2011 1046   ALKPHOS 86 08/01/2019 1248   ALKPHOS 79 11/18/2011 1046   BILITOT 0.9 08/01/2019 1248   BILITOT 0.70 11/18/2011 1046   GFRNONAA 48 (L) 08/01/2019 1248   GFRAA 56 (L) 08/01/2019 1248       Component Value Date/Time   WBC 6.7 08/01/2019 1248   RBC 5.28 08/01/2019 1248   HGB 17.0 08/01/2019 1248   HGB 16.8 11/18/2011 1046   HCT 48.2 08/01/2019 1248   HCT 48.2 11/18/2011 1046   PLT 221 08/01/2019 1248   PLT 232 11/18/2011 1046   MCV 91.3 08/01/2019 1248   MCV 92.1 11/18/2011 1046   MCH 32.2 08/01/2019 1248   MCHC 35.3 08/01/2019 1248   RDW 13.0 08/01/2019 1248   RDW 13.9 11/18/2011 1046   LYMPHSABS 0.6 (L) 08/01/2019 1248   LYMPHSABS 1.4 11/18/2011 1046   MONOABS 0.5 08/01/2019 1248   MONOABS 0.6 11/18/2011 1046   EOSABS 0.4 08/01/2019 1248   EOSABS 0.2 11/18/2011 1046   BASOSABS 0.0 08/01/2019 1248   BASOSABS 0.0 11/18/2011 1046   -------------------------------  Imaging from last 24 hours (if applicable):  Radiology interpretation: No results found.

## 2019-08-01 NOTE — Patient Instructions (Addendum)
Rehydration, Adult Rehydration is the replacement of body fluids and salts and minerals (electrolytes) that are lost during dehydration. Dehydration is when there is not enough fluid or water in the body. This happens when you lose more fluids than you take in. Common causes of dehydration include:  Vomiting.  Diarrhea.  Excessive sweating, such as from heat exposure or exercise.  Taking medicines that cause the body to lose excess fluid (diuretics).  Impaired kidney function.  Not drinking enough fluid.  Certain illnesses or infections.  Certain poorly controlled long-term (chronic) illnesses, such as diabetes, heart disease, and kidney disease.  Symptoms of mild dehydration may include thirst, dry lips and mouth, dry skin, and dizziness. Symptoms of severe dehydration may include increased heart rate, confusion, fainting, and not urinating. You can rehydrate by drinking certain fluids or getting fluids through an IV tube, as told by your health care provider. What are the risks? Generally, rehydration is safe. However, one problem that can happen is taking in too much fluid (overhydration). This is rare. If overhydration happens, it can cause an electrolyte imbalance, kidney failure, or a decrease in salt (sodium) levels in the body. How to rehydrate Follow instructions from your health care provider for rehydration. The kind of fluid you should drink and the amount you should drink depend on your condition.  If directed by your health care provider, drink an oral rehydration solution (ORS). This is a drink designed to treat dehydration that is found in pharmacies and retail stores. ? Make an ORS by following instructions on the package. ? Start by drinking small amounts, about  cup (120 mL) every 5-10 minutes. ? Slowly increase how much you drink until you have taken the amount recommended by your health care provider.  Drink enough clear fluids to keep your urine clear or pale  yellow. If you were instructed to drink an ORS, finish the ORS first, then start slowly drinking other clear fluids. Drink fluids such as: ? Water. Do not drink only water. Doing that can lead to having too little sodium in your body (hyponatremia). ? Ice chips. ? Fruit juice that you have added water to (diluted juice). ? Low-calorie sports drinks.  If you are severely dehydrated, your health care provider may recommend that you receive fluids through an IV tube in the hospital.  Do not take sodium tablets. Doing that can lead to the condition of having too much sodium in your body (hypernatremia). Eating while you rehydrate Follow instructions from your health care provider about what to eat while you rehydrate. Your health care provider may recommend that you slowly begin eating regular foods in small amounts.  Eat foods that contain a healthy balance of electrolytes, such as bananas, oranges, potatoes, tomatoes, and spinach.  Avoid foods that are greasy or contain a lot of fat or sugar.  In some cases, you may get nutrition through a feeding tube that is passed through your nose and into your stomach (nasogastric tube, or NG tube). This may be done if you have uncontrolled vomiting or diarrhea. Beverages to avoid Certain beverages may make dehydration worse. While you rehydrate, avoid:  Alcohol.  Caffeine.  Drinks that contain a lot of sugar. These include: ? High-calorie sports drinks. ? Fruit juice that is not diluted. ? Soda.  Check nutrition labels to see how much sugar or caffeine a beverage contains. Signs of dehydration recovery You may be recovering from dehydration if:  You are urinating more often than before you started   rehydrating.  Your urine is clear or pale yellow.  Your energy level improves.  You vomit less frequently.  You have diarrhea less frequently.  Your appetite improves or returns to normal.  You feel less dizzy or less light-headed.  Your  skin tone and color start to look more normal. Contact a health care provider if:  You continue to have symptoms of mild dehydration, such as: ? Thirst. ? Dry lips. ? Slightly dry mouth. ? Dry, warm skin. ? Dizziness.  You continue to vomit or have diarrhea. Get help right away if:  You have symptoms of dehydration that get worse.  You feel: ? Confused. ? Weak. ? Like you are going to faint.  You have not urinated in 6-8 hours.  You have very dark urine.  You have trouble breathing.  Your heart rate while sitting still is over 100 beats a minute.  You cannot drink fluids without vomiting.  You have vomiting or diarrhea that: ? Gets worse. ? Does not go away.  You have a fever. This information is not intended to replace advice given to you by your health care provider. Make sure you discuss any questions you have with your health care provider. Document Revised: 01/22/2017 Document Reviewed: 04/05/2015 Elsevier Patient Education  [place].     Alternate taking Motrin 200-400mg  and tylenol 650mg  for pain. Use biotene as needed for dry mouth and pain. Use prescribed viscous lidocaine as needed to help with painful swallowing. Continue to drink Boosts and Ensure during the day to get adequate nutrients. Vaseline or Burts Bees can help with the chapped lips, and make sure to drink plenty of water during the day to prevent dehydration.

## 2019-08-10 ENCOUNTER — Ambulatory Visit: Payer: PPO | Admitting: Plastic Surgery

## 2019-08-15 ENCOUNTER — Ambulatory Visit: Payer: Self-pay | Admitting: Radiation Oncology

## 2019-08-18 ENCOUNTER — Telehealth: Payer: Self-pay | Admitting: Radiation Oncology

## 2019-08-18 ENCOUNTER — Other Ambulatory Visit: Payer: Self-pay

## 2019-08-18 ENCOUNTER — Ambulatory Visit
Admission: RE | Admit: 2019-08-18 | Discharge: 2019-08-18 | Disposition: A | Discharge status: Home / Self Care | Payer: PPO | Source: Ambulatory Visit | Attending: Radiation Oncology | Admitting: Radiation Oncology

## 2019-08-18 VITALS — BP 108/72 | HR 101 | Temp 97.4°F | Resp 20 | Ht 71.0 in | Wt 176.2 lb

## 2019-08-18 DIAGNOSIS — R110 Nausea: Secondary | ICD-10-CM | POA: Diagnosis not present

## 2019-08-18 DIAGNOSIS — Z7982 Long term (current) use of aspirin: Secondary | ICD-10-CM | POA: Diagnosis not present

## 2019-08-18 DIAGNOSIS — K5900 Constipation, unspecified: Secondary | ICD-10-CM | POA: Insufficient documentation

## 2019-08-18 DIAGNOSIS — C07 Malignant neoplasm of parotid gland: Secondary | ICD-10-CM

## 2019-08-18 DIAGNOSIS — C4441 Basal cell carcinoma of skin of scalp and neck: Secondary | ICD-10-CM | POA: Insufficient documentation

## 2019-08-18 DIAGNOSIS — R531 Weakness: Secondary | ICD-10-CM | POA: Insufficient documentation

## 2019-08-18 DIAGNOSIS — Z79899 Other long term (current) drug therapy: Secondary | ICD-10-CM | POA: Diagnosis not present

## 2019-08-18 MED ORDER — ONDANSETRON 8 MG PO TBDP: 8.0000 mg | ORAL_TABLET | Freq: Three times a day (TID) | ORAL | 1 refills | Status: DC | PRN

## 2019-08-18 NOTE — Progress Notes (Signed)
Mr. Chervenak presents today for follow-up after completing radiation to the right neck/scalp on 07/26/2019  Pain issues, if any: Patient denies Using a feeding tube?: N/A Weight changes, if any:  Wt Readings from Last 3 Encounters:  08/18/19 176 lb 3.2 oz (79.9 kg)  08/01/19 184 lb 12.8 oz (83.8 kg)  06/16/19 199 lb 2 oz (90.3 kg)   Swallowing issues, if any: He reports his swallowing has improved (no longer needs viscous lidocaine before eating), but he still has hesitancy/fear of swallowing due to phlegm build up in throat/chest. He states he has never worked with a Environmental education officer before. Smoking or chewing tobacco? None Using fluoride trays daily? N/A Last ENT visit was on: 07/25/2019 Saw Dr. Izora Gala: "On exam, he has erythema around the radiation treatment area. The skin is all intact. Ear canal looks healthy and clear as does the drum. Facial nerve function, the eye is closing nicely and the brow is starting to lift. Upper lip moves very well, lower lip is still very weak. No masses palpable. Stable, facial nerve improving, complete radiation. Recheck in 2 months."  Other notable issues, if any: He still has residual fatigue and spends his time between the bed and recliner. His daughter reports he is starting to use the recliner more, but he doesn't have much energy. Patient's main concerns is his constant nausea. He is unable to eat very much because even bland/simple foods set off his nausea and cause him to have to lie down for extended periods of time to get the sensation to pass.Skin in treatment field is healing well, and his daughter is diligent about applying a lotion with coco butter and vitamin E. He reports a slight taste improvement and is hopeful it will continue to improve over time.   Vitals:   08/18/19 1106  BP: 108/72  Pulse: (!) 101  Resp: 20  Temp: (!) 97.4 F (36.3 C)  SpO2: 97%

## 2019-08-18 NOTE — Telephone Encounter (Signed)
Scheduled per 6/25 sch message. Unable to reach pt. Left voicemail- appts added on 7/7.

## 2019-08-18 NOTE — Progress Notes (Signed)
Radiation Oncology         [phone] ________________________________  Name: Martin Curry MRN: [id number]  Date: 08/18/2019  DOB: October 29, 1932  Follow-Up Visit Note  Outpatient  CC: Leanna Battles, MD  Cindra Presume, MD  Diagnosis and Prior Radiotherapy:    ICD-10-CM   1. Basal cell carcinoma (BCC) of skin of neck  C44.41   2. Cancer of parotid gland (HCC)  C07 ondansetron (ZOFRAN ODT) 8 MG disintegrating tablet    Ambulatory referral to Nutrition and Diabetic Education    CHIEF COMPLAINT: Here for follow-up and surveillance of neck cancer  Narrative:  The patient returns today for routine follow-up.   Martin Curry presents today for follow-up after completing radiation to the right neck/scalp on 07/26/2019  Pain issues, if any: Patient denies Using a feeding tube?: N/A Weight changes, if any:  Wt Readings from Last 3 Encounters:  08/18/19 176 lb 3.2 oz (79.9 kg)  08/01/19 184 lb 12.8 oz (83.8 kg)  06/16/19 199 lb 2 oz (90.3 kg)   Swallowing issues, if any: He reports his swallowing has improved (no longer needs viscous lidocaine before eating), but he still has hesitancy/fear of swallowing due to phlegm build up in throat/chest. He states he has never worked with a Environmental education officer before. Smoking or chewing tobacco? None Using fluoride trays daily? N/A Last ENT visit was on: 07/25/2019 Saw Dr. Izora Gala: "On exam, he has erythema around the radiation treatment area. The skin is all intact. Ear canal looks healthy and clear as does the drum. Facial nerve function, the eye is closing nicely and the brow is starting to lift. Upper lip moves very well, lower lip is still very weak. No masses palpable. Stable, facial nerve improving, complete radiation. Recheck in 2 months."  Other notable issues, if any: He still has residual fatigue and spends his time between the bed and recliner. His daughter reports he is starting to use the recliner more, but he doesn't have  much energy. Patient's main concerns is his constant nausea. He is unable to eat very much because even bland/simple foods set off his nausea and cause him to have to lie down for extended periods of time to get the sensation to pass.Skin in treatment field is healing well, and his daughter is diligent about applying a lotion with cocoa butter and vitamin E. He reports a slight taste improvement and is hopeful it will continue to improve over time.                                 ALLERGIES:  is allergic to penicillins, latex, and tape.  Meds: Current Outpatient Medications  Medication Sig Dispense Refill  . amLODipine (NORVASC) 5 MG tablet Take 5 mg by mouth daily.     [name] Kitchen aspirin 81 MG tablet Take 81 mg by mouth daily.    . benazepril (LOTENSIN) 10 MG tablet Take 10 mg by mouth daily.    [name] Kitchen levothyroxine (SYNTHROID) 25 MCG tablet Take 25 mcg by mouth daily before breakfast.    . Multiple Vitamin (MULTIVITAMIN) tablet Take 1 tablet by mouth daily.    [name] Kitchen omeprazole (PRILOSEC) 20 MG capsule Take 1 capsule (20 mg total) by mouth daily. 30 capsule 0  . potassium citrate (UROCIT-K) 10 MEQ (1080 MG) SR tablet Take 10 mEq by mouth 3 (three) times daily with meals.    . simvastatin (ZOCOR) 20 MG tablet Take  20 mg by mouth every evening.    [name] Kitchen HYDROcodone-acetaminophen (NORCO) 7.5-325 MG tablet Take 1 tablet by mouth every 6 (six) hours as needed for moderate pain. (Patient not taking: Reported on 05/31/2019) 20 tablet 0  . lidocaine (XYLOCAINE) 2 % solution Patient: Mix 1part 2% viscous lidocaine, 1part H20. Swish & swallow 30mL of diluted mixture, 68min before meals and at bedtime, up to QID (Patient not taking: Reported on 08/18/2019) 200 mL 3  . ondansetron (ZOFRAN ODT) 8 MG disintegrating tablet Take 1 tablet (8 mg total) by mouth every 8 (eight) hours as needed for nausea or vomiting. 90 tablet 1   No current facility-administered medications for this encounter.    Physical Findings: The patient is in  no acute distress. Patient is alert and oriented.  height is 5\' 11"  (1.803 m) and weight is 176 lb 3.2 oz (79.9 kg). His temperature is 97.4 F (36.3 C) (abnormal). His blood pressure is 108/72 and his pulse is 101 (abnormal). His respiration is 20 and oxygen saturation is 97%. [name] Kitchen    HEENT: no oral thrush Neck: There is a little swelling around the surgical incision site of the right neck.  No nodularity, no palpable masses consistent with adenopathy in the neck.  Oral cavity is clear.   Lab Findings: Lab Results  Component Value Date   WBC 6.7 08/01/2019   HGB 17.0 08/01/2019   HCT 48.2 08/01/2019   MCV 91.3 08/01/2019   PLT 221 08/01/2019    Radiographic Findings: No results found.  Impression/Plan: This is a very pleasant 86 year old gentleman with a history of locally advanced basal cell carcinoma of the right neck.  He has healed from postoperative radiotherapy.  After treatment, he developed challenges with maintaining his nutrition, dysgeusia, and nausea.  I will refer him to a nutritionist.  He reports some constipation.  I recommend he try Miralax.  For his nausea, I have prescribed Ondansetron.  He had significant symptoms of GERD during treatment.  I prescribed omeprazole.  He will talk to his PCP about whether to continue this long-term.  I recommended he continue this in the short-term until his p.o. intake normalizes    In 2 months, he will follow-up with me after restaging scans of his neck.  He and his daughter are pleased with this plan.  On date of service, in total, I spent 25 minutes on this encounter. Patient was seen in person.  _____________________________________   Eppie Gibson, MD

## 2019-08-21 ENCOUNTER — Encounter: Payer: Self-pay | Admitting: Radiation Oncology

## 2019-08-30 ENCOUNTER — Telehealth: Payer: Self-pay | Admitting: Nutrition

## 2019-08-30 ENCOUNTER — Inpatient Hospital Stay: Payer: PPO | Attending: Radiation Oncology | Admitting: Nutrition

## 2019-08-30 NOTE — Telephone Encounter (Signed)
Nutrition follow-up completed with patient over the telephone status post radiation therapy for basal cell carcinoma on the skin of his neck.  Daughter requested telephone follow-up for symptoms in face follow-up. Weight was decreased and documented as 176.2 pounds on June 25 down from 199 pounds on April 23.  This is 22.8 pounds in 11% weight loss over 2-1/2 months which is significant. Patient complains of taste alterations, nausea, fatigue, and constipation.   Reports taste alterations are beginning to improve. He has begun to eat solid foods this past week and was able to tolerate hotdogs, meatloaf, and eggs.  He has been drinking 1 bottle of Ensure at home.  He drinks 3-4 bottles of water every day.   Reports nausea improved with Zofran. Noted labs on June 8: Glucose 154 and creatinine 1.34.  Educated patient to consume smaller more frequent high-calorie high-protein foods.  Recommended oral nutrition supplements 2-3 bottles daily.  I encouraged Ensure Enlive or Ensure Plus for the additional calories and protein. Reviewed strategies for reducing thick saliva to include baking soda salt water rinses, pineapple juice or papaya nectar as tolerated.  Also encouraged humidifier in bedroom to add moisture to the air. Encourage patient to take nausea medicine as prescribed to improve nausea. Enforced importance of bowel regimen. Encourage patient to begin moving around more at home to reduce fatigue. Discouraged further weight loss.  Questions were answered.  Teach back method used.  I encouraged patient and/or daughter to contact me with any further questions or concerns.  They were very appreciative of nutrition information today.  **Disclaimer: This note was dictated with voice recognition software. Similar sounding words can inadvertently be transcribed and this note may contain transcription errors which may not have been corrected upon publication of note.**

## 2019-08-31 ENCOUNTER — Encounter: Payer: Self-pay | Admitting: Plastic Surgery

## 2019-08-31 ENCOUNTER — Other Ambulatory Visit: Payer: Self-pay

## 2019-08-31 ENCOUNTER — Ambulatory Visit (INDEPENDENT_AMBULATORY_CARE_PROVIDER_SITE_OTHER): Payer: PPO | Admitting: Plastic Surgery

## 2019-08-31 VITALS — BP 100/70 | HR 98 | Temp 97.1°F

## 2019-08-31 DIAGNOSIS — C4441 Basal cell carcinoma of skin of scalp and neck: Secondary | ICD-10-CM

## 2019-08-31 NOTE — Progress Notes (Signed)
   Referring Provider Leanna Battles, MD [address] [place],  [place] [address]   CC:  Chief Complaint  Patient presents with  . Follow-up      Martin Curry is an 86 y.o. male.  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  He required a super superficial parotidectomy.  He has since completed his radiation therapy.  He feels like radiation therapy to quite a bit out of him but he has noticed improvement in his ability to swallow and the consistency of his saliva.  Feels like inset his incision is healed well.  He has lost a bit of weight but it seems to have stabilized.  His attitude and outlook are quite good.  Review of Systems General: Denies fevers or chills  Physical Exam Vitals with BMI 08/31/2019 08/18/2019 08/01/2019  Height - 5\' 11"  -  Weight - 176 lbs 3 oz -  BMI - 56.81 -  Systolic 275 170 017  Diastolic 70 72 72  Pulse 98 101 76    General:  No acute distress,  Alert and oriented, Non-Toxic, Normal speech and affect Examination shows complete recovery of his facial nerve.  His incision is healed quite well.  He has some residual skin changes from the radiation therapy but this is improving.  Assessment/Plan Patient presents about 3 months postop from complex excision of a basal cell followed by radiation therapy.  Overall he is doing fairly well.  I think now that the effects of the radiation are wearing off his overall functional status will continue to improve.  From my standpoint he is healed well and his facial nerve shows quite good recovery.  I will plan to see him again on an as-needed basis.  Cindra Presume 08/31/2019, 4:26 PM

## 2019-09-10 NOTE — Progress Notes (Signed)
  Patient Name: Martin Curry MRN: [id number] DOB: 04-Aug-1932 Referring Physician: Mingo Amber Date of Service: 07/26/2019 [facility], [place]                                                        End Of Treatment Note  Diagnoses: C44.41-Basal cell carcinoma of skin of scalp and neck  Cancer Staging: Cancer Staging Basal cell carcinoma (BCC) of skin of neck Staging form: Cutaneous Carcinoma of the Head and Neck, AJCC 8th Edition - Clinical stage from 05/02/2019: Stage III (cT3, cN0, cM0) - Signed by Eppie Gibson, MD on 05/03/2019 - Pathologic stage from 06/16/2019: Stage III (pT3, pN1, cM0) - Signed by Eppie Gibson, MD on 06/20/2019   Intent: Curative  Radiation Treatment Dates: 06/28/2019 through 07/26/2019  Site Technique Total Dose (Gy) Dose per Fx (Gy) Completed Fx Beam Energies  Neck Right: HN_Rt IMRT 54/54 2.7 20/20 6X   Narrative: The patient tolerated radiation therapy relatively well.   Plan: The patient will follow-up with radiation oncology in 2 weeks. -----------------------------------  Eppie Gibson, MD

## 2019-09-20 ENCOUNTER — Telehealth: Payer: Self-pay

## 2019-09-20 ENCOUNTER — Other Ambulatory Visit: Payer: Self-pay

## 2019-09-20 DIAGNOSIS — C07 Malignant neoplasm of parotid gland: Secondary | ICD-10-CM

## 2019-09-20 DIAGNOSIS — R5381 Other malaise: Secondary | ICD-10-CM

## 2019-09-20 DIAGNOSIS — C4441 Basal cell carcinoma of skin of scalp and neck: Secondary | ICD-10-CM

## 2019-09-20 NOTE — Telephone Encounter (Signed)
Patient's daughter Jackelyn Poling called and left a VM that patient still seems to be struggling physically and nutritionally since completing radiation. She wanted to know if Dr. Isidore Moos would be agreeable to making a referral to physical therapy to help patient regain strength/mobility.   Relayed message to Dr. Isidore Moos who gave verbal orders for PT referral, as well as encouragement for more frequent check ins by [facility] dieticians.   Returned daughter's call with update, and she stated that patient had already be contacted by [facility] and evaluation was set up for next Monday 09/25/19. She voiced concern that patient won't want to continue to go to appointments because his wife won't be able to join him and he is her primary care taker (wife has early dementia). Daughter wanted to know if PT could be set up through [facility] since they provide in home services. Informed daughter that I was not certain if that company was contracted with [facility], but I would check into it for her so hopefully her and patient can come up with a plan after initial assessment next Monday. Daughter verbalized understanding and agreement, and denied any other needs at this time.

## 2019-09-21 ENCOUNTER — Ambulatory Visit: Payer: PPO | Admitting: Nutrition

## 2019-09-21 ENCOUNTER — Encounter: Payer: Self-pay | Admitting: General Practice

## 2019-09-21 ENCOUNTER — Telehealth: Payer: Self-pay | Admitting: Nutrition

## 2019-09-21 NOTE — Progress Notes (Signed)
See telephone note.

## 2019-09-21 NOTE — Telephone Encounter (Signed)
Received message from nursing that patient's daughter Jackelyn Poling wanted to discuss her father's nutrition. Called Debbie on the telephone.  She reports patient is eating very small meals.  He drinks approximately 2 bottles of Ensure Plus or Ensure complete daily.  She reports his weight is around 166 pounds and she is worried because she feels like his weight continues to decrease.  Thick saliva seems improved as well as nausea.  He is also taking medication for constipation.  I provided support and encouragement and developed strategies for patient to consume smaller high-calorie high-protein snacks. Reviewed in detail several ideas for adding calories to the foods her father enjoys.  Recommended taking medications with Ensure Plus if they can be taken with food.  I encouraged Debbie to call me anytime with questions or if she needs further support.  She was appreciative.

## 2019-09-22 ENCOUNTER — Other Ambulatory Visit: Payer: Self-pay

## 2019-09-22 DIAGNOSIS — R5381 Other malaise: Secondary | ICD-10-CM

## 2019-09-22 DIAGNOSIS — C07 Malignant neoplasm of parotid gland: Secondary | ICD-10-CM

## 2019-09-22 NOTE — Progress Notes (Signed)
[facility] Initial Psychosocial Assessment Clinical Social Work  Clinical Social Work contacted by phone to assess psychosocial, emotional, mental health, and spiritual needs of the patient.   Barriers to care/review of distress screen:  - Transportation:  Do you anticipate any problems getting to appointments?  Do you have someone who can help run errands for you if you need it? - Help at home:  What is your living situation (alone, family, other)?  If you are physically unable to care for yourself, who would you call on to help you?  Lives at home wife, is caregiver to wife who has a condition called PSP - she has balance difficulties and is in danger of falling.  She is also on blood thinners which makes falling more dangerous.  In order to patient to receive PT on an outpatient basis, someone would have to be in the home to care for his wife.  He prefers to receive PT in the home.  Patient and wife live 3 doors down from his daughter who is very supportive and often in the home to provide any kind of care patient and wife need.  - Support system:  What does your support system look like?  Who would you call on if you needed some kind of practical help?  What if you needed someone to talk to for emotional support?  Has support from [place] care management for wife - comes out once/month to "see how she is progressing, they give her PT exercises."  Family says they can use [facility] if needed for PT but would prefer a [place] agency.  He had [facility] for a brief time in the past, but does not have a preference for [facility] agency at this time.  - Finances:  Are you concerned about finances.  Considering returning to work?  If not, applying for disability?  No concerns at this time.  What is your understanding of where you are with your cancer? Its cause?  Your treatment plan and what happens next?  Just finished w radiation therapy for head/neck cancer.  The degree of fatigue experienced from this radiation differed  significantly from his experience w radiation for prostate cancer - he stayed in bed for several days at the conclusion of radiation, his nutrition status has not been good and he has not had energy to do much physical exercise.  He is hoping that PT would help him regain some strength and vitality.    CSW Summary:  Patient and family psychosocial functioning including strengths, limitations, and coping skills:  86 year old married male, finished w radiation therapy for head/neck cancer.  Experiencing significant fatigue and deconditioning post radiation.  Wants HH PT if possible.  Lives w wife - he is her primary caregiver.  Supportive daughter lives nearby and is actively engaged in assisting.  No current [place] agency for patient.  Identifications of barriers to care:  Wants to receive care in the home due to wife's care needs  Availability of community resources:  RN can refer to any [facility] agency in network w patient's Medicare plan for [facility] PT needs.  Will communicate this to Purvis Kilts RN.=  Clinical Social Worker follow up needed: No.   Edwyna Shell, [place] Social Worker Phone:  [phone] Cell:  [phone]

## 2019-09-25 ENCOUNTER — Encounter: Payer: Self-pay | Admitting: Physical Therapy

## 2019-09-25 ENCOUNTER — Other Ambulatory Visit: Payer: Self-pay

## 2019-09-25 ENCOUNTER — Ambulatory Visit: Payer: PPO | Attending: Radiation Oncology | Admitting: Physical Therapy

## 2019-09-25 ENCOUNTER — Telehealth: Payer: Self-pay | Admitting: *Deleted

## 2019-09-25 DIAGNOSIS — M6281 Muscle weakness (generalized): Secondary | ICD-10-CM | POA: Diagnosis not present

## 2019-09-25 DIAGNOSIS — R262 Difficulty in walking, not elsewhere classified: Secondary | ICD-10-CM | POA: Diagnosis not present

## 2019-09-25 DIAGNOSIS — R293 Abnormal posture: Secondary | ICD-10-CM | POA: Diagnosis not present

## 2019-09-25 NOTE — Therapy (Signed)
[place], [place], [address] Phone: [phone]   Fax:  [phone]  Physical Therapy Evaluation  Patient Details  Name: Martin Curry MRN: [id number] Date of Birth: 12/10/1932 Referring Provider (PT): Reita May Date: 09/25/2019    Past Medical History:  Diagnosis Date  . Atypical nevus 09/07/2002   left abdomen-slight  . BCC (basal cell carcinoma of skin) 07/19/2014   left sideburn- +margin-exc.  [name] Kitchen BCC (basal cell carcinoma) 09/07/2002   right preauricular-exc., left crown of scalp-CX35FU  . BCC (basal cell carcinoma) 10/30/2003   right forehead-MOHS  . BCC (basal cell carcinoma) 01/07/2009   left scalp, left forearm  . BCC (basal cell carcinoma) 09/27/2014   left sideburn-free  . BCC (basal cell carcinoma) 07/16/2015   left scalp  . 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 Cancer, skin, squamous cell   . History of kidney stones    Many years ago  . Hyperlipidemia   . Hypertension   . Hypothyroidism   . Nodular basal cell carcinoma (BCC) 11/21/2012   right side of scalp-CX35FU  . Nodular basal cell carcinoma (BCC) 06/12/2014   left sideburn-CX35FU/exc.  . Nodular basal cell carcinoma (BCC) 07/16/2015   left scalp-CX35FU  . Polycythemia   . Prostate cancer ([place]) 1990's   total prostatectomy and adjuvant radiation. Last PSA was 0.02 in 09/2011  . SCC (squamous cell carcinoma) 04/03/2009   forehead-txpbx  . SCC (squamous cell carcinoma) 07/16/2015   left front scalp  . SCC (squamous cell carcinoma) 07/16/2015   left front scalp-Cx35FU  . Superficial basal cell carcinoma (BCC) 11/21/2012   behind left ear-CX35FU, midback-CX35FU, right forehead-CX35FU-exc  . Superficial basal cell carcinoma (BCC) 11/02/2017   left post neck-CX35FU    Past Surgical History:  Procedure Laterality Date  . ADJACENT TISSUE TRANSFER/TISSUE REARRANGEMENT N/A 05/22/2019   Procedure: COMPLEX CLOSURE OF  NECK WOUND;  Surgeon: Cindra Presume, MD;  Location: [facility];  Service: Plastics;  Laterality: N/A;  . ALLOGRAFT APPLICATION N/A 03/03/3233   Procedure: POSSIBLE FACIAL NERVE RECONSTRUCTION WITH NERVE ALLOGRAFT VS AUTOGRAFT;  Surgeon: Cindra Presume, MD;  Location: [facility];  Service: Plastics;  Laterality: N/A;  . BLADDER SURGERY     due to prostatectomy.   [name] Kitchen CATARACT EXTRACTION     right  . COLONOSCOPY     neg in the past; due in 2014 with [place] Dr. Sharlett Iles  . EYE SURGERY     50 years ago.   [name] Kitchen HERNIA REPAIR    . MOHS SURGERY    . NECK DISSECTION  05/22/2019    NECK DISSECTION  . PAROTIDECTOMY  05/22/2019   PAROTIDECTOMY with facial nerve dissection   . PAROTIDECTOMY Right 05/22/2019   Procedure: PAROTIDECTOMY;  Surgeon: Izora Gala, MD;  Location: [facility];  Service: ENT;  Laterality: Right;  . PROSTATE SURGERY    . RADICAL NECK DISSECTION Right 05/22/2019   Procedure: RADICAL NECK DISSECTION;  Surgeon: Izora Gala, MD;  Location: [facility];  Service: ENT;  Laterality: Right;  . TONSILLECTOMY      There were no vitals filed for this visit.    Subjective Assessment - 09/25/19 1306    Subjective I have never been through anything like the radiation. It really did not hit me until after radiation. I just started feeling so bad. I have been on the edge of nausea since the end of radiation.    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 No/denies              [facility] PT Assessment - 09/25/19 0001      Assessment   Medical Diagnosis R parotid cancer    Referring Provider (PT) Isidore Moos    Onset Date/Surgical Date 04/17/19    Hand Dominance  Right    Prior Therapy none      Precautions   Precautions Other (comment)    Precaution Comments at risk for lymphedema      Restrictions   Weight Bearing Restrictions No      Balance Screen   Has the patient fallen in the past 6 months No    Has the patient had a decrease in activity level because of a fear of falling?  No    Is the patient reluctant to leave their home because of a fear of falling?  No      Home Ecologist residence    Living Arrangements Spouse/significant other    Available Help at Discharge Family    Type of [place]      Prior Function   Level of [place] Retired    Leisure pt was active and is a caregiver for his wife and took care of the yard but did not set aside time for exercise      Cognition   Overall Cognitive Status Within Functional Limits for tasks assessed      Observation/Other Assessments   Observations pt had to lie down on mat table for appt due to nausea      Functional Tests   Functional tests Sit to Stand      Sit to Stand   Comments 30 sec sit to stand: 11 reps which is good for his age      AROM   Overall AROM  Within functional limits for tasks performed      Strength   Strength Assessment Site Shoulder;Elbow;Hip;Knee;Ankle    Right Shoulder Flexion 5/5    Right Shoulder ABduction 5/5    Left Shoulder Flexion 5/5    Left Shoulder ABduction 5/5    Right Elbow Flexion 5/5    Right Elbow Extension 5/5    Left Elbow Flexion 5/5    Left Elbow Extension 5/5    Right Hip Flexion 4+/5    Right Hip Extension 2+/5    Right Hip ABduction 5/5    Left Hip Flexion 4+/5    Left Hip Extension 2+/5    Left Hip ABduction 5/5    Right Knee Flexion 5/5    Right Knee Extension 5/5    Left Knee Flexion 4+/5    Left Knee Extension 5/5    Right Ankle Dorsiflexion 5/5    Left Ankle Dorsiflexion 5/5      Ambulation/Gait   Ambulation/Gait Yes    Ambulation/Gait Assistance  7: Independent    Ambulation Distance (Feet) 15 Feet    Gait Pattern Decreased arm swing - right;Decreased arm swing - left;Decreased trunk rotation    Ambulation Surface Level  Objective measurements completed on examination: See above findings.                    PT Long Term Goals - 09/25/19 1529      PT LONG TERM GOAL #1   Title Pt will demonstrate 3+/5 bilateral hip extensor strength to decrease risk of falls    Baseline 2+ bilaterally    Time 4    Period Weeks    Status New    Target Date 10/23/19      PT LONG TERM GOAL #2   Title Pt will report he is able to walk around 5000 steps per day without increased fatigue to allow pt to return to PLOF    Baseline pt only walking around 1000 steps    Time 4    Period Weeks    Status New    Target Date 10/23/19      PT LONG TERM GOAL #3   Title Pt will be able to return to previous activities including meal prep and caregiving to allow pt to return to prior level of function without increased fatigue    Time 4    Period Weeks    Status New    Target Date 10/23/19      PT LONG TERM GOAL #4   Title Pt will be independent in a home exercise program for continued strengthening and stretching    Time 4    Period Weeks    Status New    Target Date 10/23/19      PT LONG TERM GOAL #5   Title Pt will demonstrate slow controlled descent to chair to decrease risk of falls    Baseline pt demonstrates lack of eccentric control when sitting in chair without use of UEs    Time 4    Period Weeks    Status New    Target Date 10/23/19                  Plan - 09/25/19 1403    Clinical Impression Statement Pt presents to PT for deconditioning after undergoing numerous surgies and radiation for treatment of R parotid cancer. Pt was very active outdoors and took care of his lawn and was also a caregiver for his wife who has a progressive disease. Pt was able to prepare all meals as  well. Since his diagnosis he is now unable to do any of those things. Pt is very nauseated most of the day and has to lie down frequently. He reports that is doctor feels it is from the thick mucous irritating his stomach. Pt's strenth overall is pretty good but he does have weakness in bilateral glutes. He was able to complete 11 sit to stands without use of UEs in 30 sec which is good for his age but he did not have good eccentric control when sitting. Pt was winded and fatigued after sit to stands. Pt would benefit from skilled PT services for high level balance exercises, glute strengthening and over deconditioning to allow pt to return to PLOF.    Personal Factors and Comorbidities Age;Comorbidity 1    Comorbidities prostate cancer with radiation    Examination-Activity Limitations Squat;Lift;Stairs;Stand;Caring for Others;Carry;Transfers    Examination-Participation Restrictions Yard Work;Driving;Meal Prep;Community Activity;Cleaning;Shop    Stability/Clinical Decision Making Stable/Uncomplicated    Clinical Decision Making Low    Rehab Potential Good    PT Frequency 2x / week    PT Duration 4 weeks  PT Treatment/Interventions ADLs/Self Care Home Management;Gait training;Neuromuscular re-education;Balance training;Therapeutic exercise;Therapeutic activities;Patient/family education;Manual techniques    PT Next Visit Plan take baseline neck measurements for lymphedema, begin balance exercises in //bars, glute strengthening, NuStep    Consulted and Agree with Plan of Care Patient;Family member/caregiver    Family Member Consulted daughter           Patient will benefit from skilled therapeutic intervention in order to improve the following deficits and impairments:  Abnormal gait, Decreased endurance, Decreased balance, Difficulty walking, Decreased activity tolerance, Postural dysfunction  Visit Diagnosis: Difficulty in walking, not elsewhere classified  Muscle weakness  (generalized)  Abnormal posture     Problem List Patient Active Problem List   Diagnosis Date Noted  . 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 Basal cell carcinoma (BCC) of skin of neck 05/03/2019  . Polycythemia     Allyson Sabal [facility] 09/25/2019, 3:34 PM  [place], [place], [address] Phone: [phone]   Fax:  [phone]  Name: Martin Curry MRN: [id number] Date of Birth: July 31, 1932  Manus Gunning, PT 09/25/19 3:34 PM

## 2019-09-25 NOTE — Telephone Encounter (Signed)
Called patient to inform of lab and CT for 10-12-19- lab  @ 12:45 pm @ [place] and his CT - arrival time- 1:45 pm @ [facility], patient to have water only -4 hrs. prior to test, patient to receive results on 10-13-19 @ 2:40 pm from Dr. Isidore Moos, spoke with patient's daughter- Cephus Shelling and she is aware of these appts.

## 2019-09-26 ENCOUNTER — Ambulatory Visit: Payer: PPO | Admitting: Nutrition

## 2019-09-26 ENCOUNTER — Other Ambulatory Visit: Payer: Self-pay

## 2019-09-26 DIAGNOSIS — C07 Malignant neoplasm of parotid gland: Secondary | ICD-10-CM

## 2019-09-26 NOTE — Progress Notes (Signed)
Patient's daughter Martin Curry contacted me by telephone.  She reports her father is getting "worse".  She reports she thinks he is eating less than he was before.  He is complaining of constant nausea.  Reports taking one half Zofran twice a day.  He is afraid to take more because in the past it has made him constipated.  He is drinking 2 - 16 ounce bottles of water daily.  This is decreased from 3 bottles.  He drinks approximately 1-2 ensures daily.  He is eating 1 egg omelette at breakfast, 3 slices of pear for lunch and a broth-based soup for dinner. I have contacted nursing to request return call regarding constant nausea.  Encouraged her to offer small amounts of high-calorie high-protein food at mealtimes and try to increase Ensure Plus 3 times daily between meals. Stressed importance of nausea management as well as a bowel regimen. MD was made aware.  Daughter states she will contact me with an update as needed.

## 2019-09-27 ENCOUNTER — Other Ambulatory Visit: Payer: Self-pay | Admitting: Emergency Medicine

## 2019-09-27 ENCOUNTER — Inpatient Hospital Stay: Payer: PPO | Attending: Radiation Oncology | Admitting: Nutrition

## 2019-09-27 ENCOUNTER — Encounter: Payer: Self-pay | Admitting: Medical

## 2019-09-27 ENCOUNTER — Inpatient Hospital Stay: Payer: PPO

## 2019-09-27 ENCOUNTER — Encounter: Payer: Self-pay | Admitting: Physical Therapy

## 2019-09-27 ENCOUNTER — Ambulatory Visit: Payer: PPO | Admitting: Physical Therapy

## 2019-09-27 ENCOUNTER — Other Ambulatory Visit: Payer: Self-pay

## 2019-09-27 ENCOUNTER — Inpatient Hospital Stay (HOSPITAL_BASED_OUTPATIENT_CLINIC_OR_DEPARTMENT_OTHER): Payer: PPO | Admitting: Medical

## 2019-09-27 VITALS — BP 114/75 | HR 90 | Temp 97.7°F | Resp 18 | Ht 71.0 in | Wt 169.1 lb

## 2019-09-27 DIAGNOSIS — E860 Dehydration: Secondary | ICD-10-CM | POA: Diagnosis not present

## 2019-09-27 DIAGNOSIS — R262 Difficulty in walking, not elsewhere classified: Secondary | ICD-10-CM | POA: Diagnosis not present

## 2019-09-27 DIAGNOSIS — C07 Malignant neoplasm of parotid gland: Secondary | ICD-10-CM | POA: Diagnosis not present

## 2019-09-27 DIAGNOSIS — M6281 Muscle weakness (generalized): Secondary | ICD-10-CM

## 2019-09-27 LAB — CMP (CANCER CENTER ONLY)
ALT: 36 U/L (ref 0–44)
AST: 20 U/L (ref 15–41)
Albumin: 3.8 g/dL (ref 3.5–5.0)
Alkaline Phosphatase: 90 U/L (ref 38–126)
Anion gap: 10 (ref 5–15)
BUN: 19 mg/dL (ref 8–23)
CO2: 25 mmol/L (ref 22–32)
Calcium: 10.6 mg/dL — ABNORMAL HIGH (ref 8.9–10.3)
Chloride: 104 mmol/L (ref 98–111)
Creatinine: 1.49 mg/dL — ABNORMAL HIGH (ref 0.61–1.24)
GFR, Est AFR Am: 49 mL/min — ABNORMAL LOW (ref >60)
GFR, Estimated: 42 mL/min — ABNORMAL LOW (ref >60)
Glucose, Bld: 139 mg/dL — ABNORMAL HIGH (ref 70–99)
Potassium: 4.1 mmol/L (ref 3.5–5.1)
Sodium: 139 mmol/L (ref 135–145)
Total Bilirubin: 0.7 mg/dL (ref 0.3–1.2)
Total Protein: 7 g/dL (ref 6.5–8.1)

## 2019-09-27 LAB — CBC WITH DIFFERENTIAL (CANCER CENTER ONLY)
Abs Immature Granulocytes: 0.03 10*3/uL (ref 0.00–0.07)
Basophils Absolute: 0 10*3/uL (ref 0.0–0.1)
Basophils Relative: 0 %
Eosinophils Absolute: 0.1 10*3/uL (ref 0.0–0.5)
Eosinophils Relative: 2 %
HCT: 47.1 % (ref 39.0–52.0)
Hemoglobin: 15.6 g/dL (ref 13.0–17.0)
Immature Granulocytes: 0 %
Lymphocytes Relative: 12 %
Lymphs Abs: 0.9 10*3/uL (ref 0.7–4.0)
MCH: 30.5 pg (ref 26.0–34.0)
MCHC: 33.1 g/dL (ref 30.0–36.0)
MCV: 92.2 fL (ref 80.0–100.0)
Monocytes Absolute: 0.6 10*3/uL (ref 0.1–1.0)
Monocytes Relative: 8 %
Neutro Abs: 5.9 10*3/uL (ref 1.7–7.7)
Neutrophils Relative %: 78 %
Platelet Count: 211 10*3/uL (ref 150–400)
RBC: 5.11 MIL/uL (ref 4.22–5.81)
RDW: 13.9 % (ref 11.5–15.5)
WBC Count: 7.5 10*3/uL (ref 4.0–10.5)
nRBC: 0 % (ref 0.0–0.2)

## 2019-09-27 LAB — MAGNESIUM: Magnesium: 2.3 mg/dL (ref 1.7–2.4)

## 2019-09-27 MED ORDER — SODIUM CHLORIDE 0.9 % IV SOLN
Freq: Once | INTRAVENOUS | Status: AC
  Filled 2019-09-27: qty 250

## 2019-09-27 NOTE — Patient Instructions (Signed)
Heel Raise: Bilateral (Standing)   Cancer Rehab [phone]    Stand near counter for fingertip support if needed. Rise on balls of feet. Repeat __10-20__ times per set. Do _1-2___ sets per session. Do __2__ sessions per day.  SINGLE LIMB STANCE    Stand at counter (corner if you have one) for minimal arm support. Raise leg. Hold _10-20__ seconds. Work up to 30 seconds on each. Repeat with other leg. Once this becomes easier, for increased challenge, close eyes with fingertips on counter for safety. _3__ reps per set, _1-2__ sets per day.          Tandem Stance    Stand at counter (corner if you have one). Right foot in front of left, heel touching toe both feet "straight ahead". Stand on Foot Triangle of Support with both feet. Balance in this position _10-20__ seconds. Do with left foot in front of right. Once this becomes easier, for increased challenge, close eyes with fingertips on counter for safety.  Step-Up: Forward    Move step-stool to counter or hold rail if at steps, but as little as able. Step up forward keeping opposite foot off step. Keep pelvis level and back straight. Hold the single leg stand for 3 seconds, then come back down slowly.  Do _10__ times, do _1-2_ sets, on each leg, _1-2__ times per day.    - Wall tap with glutes x 10 reps, 1-2 sets, keep feet planted and don't allow toes to raise   http://plyo.exer.us/71   Copyright  VHI. All rights reserved.    - Three way toe tap- standing at counter and using hand for support if needed, tap toe in front, then back to the middle, then out to the side, back to the middle, then straight back and back to the middle x 10 in each direction. Keep opposite foot firmly planted to floor.    Mini Squat: Double Leg    Place chair behind you. With feet shoulder width apart, reach forward for balance and do a mini squat. Keep knees in line with second toe. Knees do not go past toes. Repeat _10__ times per set.  Rest _1-2__ seconds after set. Do _1-2__ sets per session.  http://plyo.exer.us/70   Copyright  VHI. All rights reserved.

## 2019-09-27 NOTE — Therapy (Addendum)
[place], [place], [address] Phone: [phone]   Fax:  [phone]  Physical Therapy Treatment  Patient Details  Name: Martin Curry MRN: [id number] Date of Birth: 01/29/1933 Referring Provider (PT): Reita May Date: 09/27/2019   PT End of Session - 09/27/19 1206    Visit Number 2    Number of Visits 9    Date for PT Re-Evaluation 10/23/19    PT Start Time 1109    PT Stop Time 1200    PT Time Calculation (min) 51 min    Activity Tolerance Patient tolerated treatment well    Behavior During Therapy [facility] for tasks assessed/performed           Past Medical History:  Diagnosis Date  . Atypical nevus 09/07/2002   left abdomen-slight  . BCC (basal cell carcinoma of skin) 07/19/2014   left sideburn- +margin-exc.  [name] Kitchen BCC (basal cell carcinoma) 09/07/2002   right preauricular-exc., left crown of scalp-CX35FU  . BCC (basal cell carcinoma) 10/30/2003   right forehead-MOHS  . BCC (basal cell carcinoma) 01/07/2009   left scalp, left forearm  . BCC (basal cell carcinoma) 09/27/2014   left sideburn-free  . BCC (basal cell carcinoma) 07/16/2015   left scalp  . 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 Cancer, skin, squamous cell   . History of kidney stones    Many years ago  . Hyperlipidemia   . Hypertension   . Hypothyroidism   . Nodular basal cell carcinoma (BCC) 11/21/2012   right side of scalp-CX35FU  . Nodular basal cell carcinoma (BCC) 06/12/2014   left sideburn-CX35FU/exc.  . Nodular basal cell carcinoma (BCC) 07/16/2015   left scalp-CX35FU  . Polycythemia   . Prostate cancer ([place]) 1990's   total prostatectomy and adjuvant radiation. Last PSA was 0.02 in 09/2011  . SCC (squamous cell carcinoma) 04/03/2009   forehead-txpbx  . SCC (squamous cell carcinoma) 07/16/2015   left front scalp  . SCC (squamous cell carcinoma) 07/16/2015   left front scalp-Cx35FU  . Superficial basal cell  carcinoma (BCC) 11/21/2012   behind left ear-CX35FU, midback-CX35FU, right forehead-CX35FU-exc  . Superficial basal cell carcinoma (BCC) 11/02/2017   left post neck-CX35FU    Past Surgical History:  Procedure Laterality Date  . ADJACENT TISSUE TRANSFER/TISSUE REARRANGEMENT N/A 05/22/2019   Procedure: COMPLEX CLOSURE OF NECK WOUND;  Surgeon: Cindra Presume, MD;  Location: [facility];  Service: Plastics;  Laterality: N/A;  . ALLOGRAFT APPLICATION N/A 0/27/7412   Procedure: POSSIBLE FACIAL NERVE RECONSTRUCTION WITH NERVE ALLOGRAFT VS AUTOGRAFT;  Surgeon: Cindra Presume, MD;  Location: [facility];  Service: Plastics;  Laterality: N/A;  . BLADDER SURGERY     due to prostatectomy.   [name] Kitchen CATARACT EXTRACTION     right  . COLONOSCOPY     neg in the past; due in 2014 with [place] Dr. Sharlett Iles  . EYE SURGERY     50 years ago.   [name] Kitchen HERNIA REPAIR    . MOHS SURGERY    . NECK DISSECTION  05/22/2019    NECK DISSECTION  . PAROTIDECTOMY  05/22/2019   PAROTIDECTOMY with facial nerve dissection   . PAROTIDECTOMY Right 05/22/2019   Procedure: PAROTIDECTOMY;  Surgeon: Izora Gala, MD;  Location: [facility];  Service: ENT;  Laterality: Right;  . PROSTATE SURGERY    . RADICAL NECK DISSECTION Right 05/22/2019   Procedure: RADICAL NECK DISSECTION;  Surgeon: Izora Gala, MD;  Location: [facility];  Service: ENT;  Laterality: Right;  . TONSILLECTOMY      There were no vitals filed for this visit.   Subjective Assessment - 09/27/19 1108    Subjective My nausea is doing better today. I do not know how I will be staying up all day.    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  No/denies                 LYMPHEDEMA/ONCOLOGY QUESTIONNAIRE - 09/27/19 0001      Lymphedema Assessments   Lymphedema Assessments Head and Neck      Head and Neck   4 cm superior to sternal notch around neck 37 cm    6 cm superior to sternal notch around neck 37.5 cm    8 cm superior to sternal notch around neck 38.5 cm                      [facility] Adult PT Treatment/Exercise - 09/27/19 0001      High Level Balance   High Level Balance Comments Began instructing pt in a home exercise program including balance exercises and glute strengthening as follows: step ups to SLS x 3 sec holds x 10 bilaterally, standing on 1 leg using counter for support x 20 sec bilaterally, 3 way toe taps x 10 reps in each direction bilaterally, mini squats with v/c for correct posture and technique x 10 reps bilaterally, wall taps with v/c to plant feet and not let toes come up, tandem stance at counter holding for 30 sec bilaterally - educated pt and daughter in all exercises and created a spreadsheet for pt to use to track exercises      Manual Therapy   Manual Therapy Edema management    Edema Management circumferential baseline measurements taken of neck                       PT Long Term Goals - 09/25/19 1529      PT LONG TERM GOAL #1   Title Pt will demonstrate 3+/5 bilateral hip extensor strength to decrease risk of falls    Baseline 2+ bilaterally    Time 4    Period Weeks    Status New    Target Date 10/23/19      PT LONG TERM GOAL #2   Title Pt will report he is able to walk around 5000 steps per day without increased fatigue to allow pt to return to PLOF    Baseline pt only walking around 1000 steps    Time 4    Period Weeks    Status New    Target Date 10/23/19      PT LONG TERM GOAL #3   Title Pt will be able to return to previous activities including meal prep and caregiving to allow pt to return to prior level of function without increased fatigue     Time 4    Period Weeks    Status New    Target Date 10/23/19      PT LONG TERM GOAL #4   Title  Pt will be independent in a home exercise program for continued strengthening and stretching    Time 4    Period Weeks    Status New    Target Date 10/23/19      PT LONG TERM GOAL #5   Title Pt will demonstrate slow controlled descent to chair to decrease risk of falls    Baseline pt demonstrates lack of eccentric control when sitting in chair without use of UEs    Time 4    Period Weeks    Status New    Target Date 10/23/19                 Plan - 09/27/19 1207    Clinical Impression Statement Instructed pt in balance exercises for LEs as well as glute strengthening exercises. Had pt return demonstrate all exercises. All instructed pt's daughter since she will be involved. Created a spreadsheet for pt to track his exercises as well as hold times and number of sets per day. Pt has started a walking program, educated pt to continue with this.    PT Frequency 2x / week    PT Duration 4 weeks    PT Treatment/Interventions ADLs/Self Care Home Management;Gait training;Neuromuscular re-education;Balance training;Therapeutic exercise;Therapeutic activities;Patient/family education;Manual techniques    PT Next Visit Plan see how exercises are going from HEP, begin balance exercises in //bars, glute strengthening, NuStep    Consulted and Agree with Plan of Care Patient;Family member/caregiver    Family Member Consulted daughter           Patient will benefit from skilled therapeutic intervention in order to improve the following deficits and impairments:  Abnormal gait, Decreased endurance, Decreased balance, Difficulty walking, Decreased activity tolerance, Postural dysfunction  Visit Diagnosis: Difficulty in walking, not elsewhere classified  Muscle weakness (generalized)     Problem List Patient Active Problem List   Diagnosis Date Noted  . Cancer of parotid gland ([place])  05/22/2019  . Basal cell carcinoma (BCC) of skin of neck 05/03/2019  . Polycythemia     Allyson Sabal [facility] 09/27/2019, 12:10 PM  [place], [place], [address] Phone: [phone]   Fax:  [phone]  Name: DEEP BONAWITZ MRN: [id number] Date of Birth: 09/29/1932  Manus Gunning, PT 09/27/19 12:10 PM   PHYSICAL THERAPY DISCHARGE SUMMARY  Visits from Start of Care: 2  Current functional level related to goals / functional outcomes: See above, pt wants to switch to home health PT  Remaining deficits: See above   Education / Equipment: HEP  Plan: Patient agrees to discharge.  Patient goals were not met. Patient is being discharged due to not returning since the last visit.  ?????     Allyson Sabal [place], [place] 11/22/19 4:02 PM

## 2019-09-27 NOTE — Progress Notes (Signed)
Nutrition follow-up completed with patient in symptom management clinic. Weight documented as 169.1 pounds on August 4. Patient is frustrated with the time it is taking for him to get back to his new normal. Reports nausea is somewhat improved after increasing Zofran to 8 mg twice a day. Reports he has been taking stool softener and has had a bowel movement. Reports he is currently drinking 3 bottles of Ensure Plus a day and 2 bottles of water. He is eating small amounts of food. Noted labs: Glucose 139 and creatinine 1.49.  Patient is receiving IV fluids.  Nutrition diagnosis: Food and nutrition related knowledge deficit continues.  Intervention: Educated patient to continue strategies for increasing calories and protein throughout the day. Recommended he continue to try small bites at a time. Reviewed strategies for decreasing thick saliva. Encouraged bowel regimen. Recommended he continue at least 3 bottles of Ensure Plus a day while increasing food intake. Increase liquids throughout the day.  Monitoring, evaluation, goals: Patient will tolerate increased calories and protein to support weight gain and healing.  Next visit: To be scheduled as needed.  **Disclaimer: This note was dictated with voice recognition software. Similar sounding words can inadvertently be transcribed and this note may contain transcription errors which may not have been corrected upon publication of note.**

## 2019-09-27 NOTE — Patient Instructions (Signed)

## 2019-09-28 NOTE — Progress Notes (Signed)
Martin Curry was seen while he was receiving IV fluids today.  He has a history of a head neck cancer.  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  He was treated with radiation which she completed on 07/26/2019.  Despite this she continues to have anorexia, fatigue, thick mucus in his throat that causes gagging and nausea.  He was seen by the nutritionist today.  He presents with his daughter.  We had a lengthy conversation today regarding foods that he could try things that he could do such as increasing his fluid intake to decrease the thickness of his saliva.  Sandi Mealy, MHS, PA-C Physician Assistant

## 2019-10-03 ENCOUNTER — Telehealth: Payer: Self-pay | Admitting: Nutrition

## 2019-10-03 ENCOUNTER — Encounter: Payer: PPO | Admitting: Nutrition

## 2019-10-03 NOTE — Telephone Encounter (Signed)
Nutrition follow up completed with patient over the telephone. Patient reports he is feeling better and his symptoms are improving. He is eating 3 meals with snacks and drinking 3 Ensure Plus daily. States he is drinking 2 bottles of water daily and has added other juices. Reports he is walking at least 1500 steps a day and is increasing gradually. He is also doing physical therapy recommended exercises at home. He is feeling encouraged that he is improving. Provided support and encouragement. Asked patient to contact me with further questions or concerns.

## 2019-10-12 ENCOUNTER — Other Ambulatory Visit: Payer: Self-pay

## 2019-10-12 ENCOUNTER — Ambulatory Visit
Admission: RE | Admit: 2019-10-12 | Discharge: 2019-10-12 | Disposition: A | Discharge status: Home / Self Care | Payer: PPO | Source: Ambulatory Visit | Attending: Radiation Oncology | Admitting: Radiation Oncology

## 2019-10-12 ENCOUNTER — Ambulatory Visit (HOSPITAL_COMMUNITY)
Admission: RE | Admit: 2019-10-12 | Discharge: 2019-10-12 | Disposition: A | Discharge status: Home / Self Care | Payer: PPO | Source: Ambulatory Visit | Attending: Radiation Oncology | Admitting: Radiation Oncology

## 2019-10-12 DIAGNOSIS — C4441 Basal cell carcinoma of skin of scalp and neck: Secondary | ICD-10-CM | POA: Insufficient documentation

## 2019-10-12 DIAGNOSIS — I7090 Unspecified atherosclerosis: Secondary | ICD-10-CM | POA: Diagnosis not present

## 2019-10-12 DIAGNOSIS — C07 Malignant neoplasm of parotid gland: Secondary | ICD-10-CM

## 2019-10-12 DIAGNOSIS — K118 Other diseases of salivary glands: Secondary | ICD-10-CM | POA: Diagnosis not present

## 2019-10-12 DIAGNOSIS — E041 Nontoxic single thyroid nodule: Secondary | ICD-10-CM | POA: Diagnosis not present

## 2019-10-12 DIAGNOSIS — C760 Malignant neoplasm of head, face and neck: Secondary | ICD-10-CM | POA: Diagnosis not present

## 2019-10-12 LAB — BUN & CREATININE (CHCC)
BUN: 19 mg/dL (ref 8–23)
Creatinine: 1.44 mg/dL — ABNORMAL HIGH (ref 0.61–1.24)
GFR, Est AFR Am: 51 mL/min — ABNORMAL LOW (ref >60)
GFR, Estimated: 44 mL/min — ABNORMAL LOW (ref >60)

## 2019-10-12 MED ORDER — IOHEXOL 300 MG/ML  SOLN
60.0000 mL | Freq: Once | INTRAMUSCULAR | Status: AC | PRN
  Administered 2019-10-12: 60 mL via INTRAVENOUS

## 2019-10-12 MED ORDER — SODIUM CHLORIDE (PF) 0.9 % IJ SOLN
INTRAMUSCULAR | Status: AC
  Filled 2019-10-12: qty 50

## 2019-10-12 NOTE — Progress Notes (Signed)
Martin Curry presents today for follow-up after completing radiation to the right neck/scalp on 07/26/2019 and to review CT scan results from 10/12/2019  Pain issues, if any: Patient denies Using a feeding tube?: N/A Weight changes, if any:  Wt Readings from Last 3 Encounters:  10/13/19 167 lb 6.4 oz (75.9 kg)  09/27/19 169 lb 1.6 oz (76.7 kg)  08/18/19 176 lb 3.2 oz (79.9 kg)   Swallowing issues, if any: Swallowing has improved significantly. He can eat/drink almost anything, but his main issue is lack of desire to eat. Smoking or chewing tobacco? None Using fluoride trays daily? N/A Last ENT visit was on: 07/25/19 (scheduled to see again this month?)  Other notable issues, if any: Continues to deal with thick secretions and dry mouth. He attended 2 outpatient PT sessions, and has deferred going to any more until he feels more energy/stamina. He states he is doing the exercises he learned at home, and is working up to the repetitions and frequency recommended. He continues to struggle with nausea and feeling poorly when not reclined or supine. He is taking ondansetron twice a day to help with nausea, and having to take miralax to manage constipation. He reports he often has no desire to eat but makes himself eat because he knows he needs the calories (does 3 small meals and an Ensure supplement in between). He reports he's sleeping well, and denies feeling depressed, he just doesn't feel desire to eat the foods he used to enjoy or motivation to do the tasks he used to enjoy. He reports he feels better than he did a month ago, but is still not satisfied with his overall recovery  Vitals:   10/13/19 1451  BP: 114/82  Pulse: 86  Resp: 18  Temp: 97.8 F (36.6 C)  SpO2: 97%

## 2019-10-13 ENCOUNTER — Ambulatory Visit
Admission: RE | Admit: 2019-10-13 | Discharge: 2019-10-13 | Disposition: A | Discharge status: Home / Self Care | Payer: PPO | Source: Ambulatory Visit | Attending: Radiation Oncology | Admitting: Radiation Oncology

## 2019-10-13 ENCOUNTER — Other Ambulatory Visit: Payer: Self-pay

## 2019-10-13 VITALS — BP 114/82 | HR 86 | Temp 97.8°F | Resp 18 | Ht 71.0 in | Wt 167.4 lb

## 2019-10-13 DIAGNOSIS — Z923 Personal history of irradiation: Secondary | ICD-10-CM | POA: Diagnosis not present

## 2019-10-13 DIAGNOSIS — Z85828 Personal history of other malignant neoplasm of skin: Secondary | ICD-10-CM | POA: Insufficient documentation

## 2019-10-13 DIAGNOSIS — C07 Malignant neoplasm of parotid gland: Secondary | ICD-10-CM

## 2019-10-13 DIAGNOSIS — C4441 Basal cell carcinoma of skin of scalp and neck: Secondary | ICD-10-CM

## 2019-10-13 NOTE — Progress Notes (Signed)
Oncology Nurse Navigator Documentation  I met with Martin Curry and his daughter Martin Curry today during his follow up with Dr. Isidore Moos. He reports continued nausea and constipation, but both have been managed with zofran and miralax. Overall he feels like he has improved over the last week, with more better days than bad days. He continues to experience thick saliva, taste changes, and decreased appetite. He feels encouraged by his recent improvements. I will schedule him to see Sandi Mealy PA in one month for continued symptom management control. He will see Dr. Isidore Moos in mid December after a repeat CT scan of his neck. They know to call me if they have any further questions or concerns.   Harlow Asa RN, BSN, OCN Head & Neck Oncology Nurse [place] [facility] Phone # [phone]  Fax # [phone]

## 2019-10-16 ENCOUNTER — Encounter: Payer: PPO | Admitting: Physical Therapy

## 2019-10-17 ENCOUNTER — Encounter: Payer: Self-pay | Admitting: Radiation Oncology

## 2019-10-17 ENCOUNTER — Telehealth: Payer: Self-pay | Admitting: Medical

## 2019-10-17 NOTE — Telephone Encounter (Signed)
Scheduled appt per 8/23 sch msg - pt is aware of apt date and time

## 2019-10-17 NOTE — Progress Notes (Signed)
Radiation Oncology         [phone] ________________________________  Name: Martin Curry MRN: [id number]  Date: 10/13/2019  DOB: 08/20/1932  Follow-Up Visit Note  Outpatient  CC: Leanna Battles, MD  Cindra Presume, MD  Diagnosis and Prior Radiotherapy:    ICD-10-CM   1. 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 Basal cell carcinoma (BCC) of skin of neck  C44.41     CHIEF COMPLAINT: Here for follow-up and surveillance of neck cancer  Narrative:    Martin Curry presents today for follow-up after completing radiation to the right neck/scalp on 07/26/2019 and to review CT scan results from 10/12/2019  Pain issues, if any: Patient denies Using a feeding tube?: N/A Weight changes, if any:  Wt Readings from Last 3 Encounters:  10/13/19 167 lb 6.4 oz (75.9 kg)  09/27/19 169 lb 1.6 oz (76.7 kg)  08/18/19 176 lb 3.2 oz (79.9 kg)   Swallowing issues, if any: Swallowing has improved significantly. He can eat/drink almost anything, but his main issue is lack of desire to eat. Smoking or chewing tobacco? None Using fluoride trays daily? N/A Last ENT visit was on: 07/25/19   Other notable issues, if any: Continues to deal with thick secretions and dry mouth. He attended 2 outpatient PT sessions, and has deferred going to any more until he feels more energy/stamina. He states he is doing the exercises he learned at home, and is working up to the repetitions and frequency recommended. He continues to struggle with nausea and feeling poorly when not reclined or supine. He is taking ondansetron twice a day to help with nausea, and having to take miralax to manage constipation. He reports he often has no desire to eat but makes himself eat because he knows he needs the calories (does 3 small meals and an Ensure supplement in between). He reports he's sleeping well, and denies feeling depressed, he just doesn't feel desire to eat the foods he used to enjoy or motivation to do the tasks he used to  enjoy. He reports he feels better than he did a month ago, but is still not satisfied with his overall recovery   He is not taking his proton pump inhibitor regularly as his reflux has improved.  He and his daughter have questions about appetite stimulants.                               ALLERGIES:  is allergic to penicillins, latex, and tape.  Meds: Current Outpatient Medications  Medication Sig Dispense Refill  . amLODipine (NORVASC) 5 MG tablet Take 5 mg by mouth daily.     [name] Kitchen aspirin 81 MG tablet Take 81 mg by mouth daily.    . benazepril (LOTENSIN) 10 MG tablet Take 10 mg by mouth daily.    [name] Kitchen levothyroxine (SYNTHROID) 25 MCG tablet Take 25 mcg by mouth daily before breakfast.    . Multiple Vitamin (MULTIVITAMIN) tablet Take 1 tablet by mouth daily.    [name] Kitchen omeprazole (PRILOSEC) 20 MG capsule Take 1 capsule (20 mg total) by mouth daily. 30 capsule 0  . ondansetron (ZOFRAN ODT) 8 MG disintegrating tablet Take 1 tablet (8 mg total) by mouth every 8 (eight) hours as needed for nausea or vomiting. 90 tablet 1  . potassium citrate (UROCIT-K) 10 MEQ (1080 MG) SR tablet Take 10 mEq by mouth 3 (three) times daily with meals.    [name] Kitchen  simvastatin (ZOCOR) 20 MG tablet Take 20 mg by mouth every evening.    [name] Kitchen HYDROcodone-acetaminophen (NORCO) 7.5-325 MG tablet Take 1 tablet by mouth every 6 (six) hours as needed for moderate pain. (Patient not taking: Reported on 10/13/2019) 20 tablet 0  . lidocaine (XYLOCAINE) 2 % solution Patient: Mix 1part 2% viscous lidocaine, 1part H20. Swish & swallow 26mL of diluted mixture, 12min before meals and at bedtime, up to QID (Patient not taking: Reported on 10/13/2019) 200 mL 3   No current facility-administered medications for this encounter.    Physical Findings: General : the patient is in no acute distress. Patient is alert and oriented.  height is 5\' 11"  (1.803 m) and weight is 167 lb 6.4 oz (75.9 kg). His temperature is 97.8 F (36.6 C). His blood pressure is 114/82  and his pulse is 86. His respiration is 18 and oxygen saturation is 97%. [name] Kitchen    HEENT: no oral thrush Neck: Expected scar tissue palpable in the right neck.  Incision is slightly thickened, well approximated and intact. Skin: no concerning skin lesions over the neck Psychiatric: Alert and oriented, no acute distress, judgment and insight appear intact  Lab Findings: Lab Results  Component Value Date   WBC 7.5 09/27/2019   HGB 15.6 09/27/2019   HCT 47.1 09/27/2019   MCV 92.2 09/27/2019   PLT 211 09/27/2019    Radiographic Findings: CT Soft Tissue Neck W Contrast  Result Date: 10/12/2019 CLINICAL DATA:  Surveillance of cancer of the head and neck. Status post surgery and radiation therapy for parotid tumor. EXAM: CT NECK WITH CONTRAST TECHNIQUE: Multidetector CT imaging of the neck was performed using the standard protocol following the bolus administration of intravenous contrast. CONTRAST:  64mL OMNIPAQUE IOHEXOL 300 MG/ML  SOLN COMPARISON:  04/21/2019 FINDINGS: Pharynx and larynx: Normal. No mass or swelling. Salivary glands: Status post right parotidectomy. There is a nodular focus at the posterior aspect of the surgical bed that measures 10 x 6 mm. (series 2, image 53). Bilateral submandibular ductal ectasia is unchanged. Thyroid: Heterogeneous and enlarged right thyroid lobe, unchanged. Lymph nodes: None enlarged or abnormal density. Vascular: Calcific aortic atherosclerosis. Atherosclerotic calcification of both carotid bifurcations. Limited intracranial: Negative. Visualized orbits: Negative. Mastoids and visualized paranasal sinuses: Clear. Skeleton: No acute or aggressive process. Upper chest: Negative. Other: None. IMPRESSION: 1. Status post right parotidectomy with nodular focus at the posterior aspect of the surgical bed that measures 10 x 6 mm. This may be a small amount of residual/recurrent tumor vs postsurgical changes. PET CT or continued follow-up may be helpful for differentiation.  2. No cervical lymphadenopathy. 3. Heterogeneous and enlarged right thyroid lobe, unchanged. Recommend thyroid ultrasound (ref: J Am Coll Radiol. 2015 Feb;12(2): 143-50). 4. Aortic Atherosclerosis (ICD10-I70.0). Electronically Signed   By: Ulyses Jarred [name]   On: 10/12/2019 23:48    Impression/Plan: This is a very pleasant 86 year old gentleman with a history of locally advanced basal cell carcinoma of the right neck.   I personally reviewed his imaging.  My assessment is this is a favorable baseline scan.  We will compare this to future scans to reassure that there is no evidence of progression or recurrence.  His physical exam is also encouraging with no evidence of progressive or recurrent disease.  He is postoperative scar tissue to palpation in the right neck.  We discussed the persistent enlarged right thyroid lobe that has been stable.  I encouraged him to talk to Dr. Constance Holster about this to make a  decision together as to whether continued observation is preferred or a more aggressive work-up such as ultrasound and/or biopsy.  They understand that this is not an urgent issue and that healing from the treatment for his basal cell carcinoma should be our focus in the immediate future.  We spoke about his recovery which seems to be moving in the right direction, though he does have some difficult days.  Today's a good day in his assessment, in the past week has been encouraging overall.  His appetite remains suboptimal but it is improving slowly.  They had questions about appetite stimulants and we discussed various options including steroids, Marinol, Megace, and mirtazapine.  Overall, I think that the risks of these medications outweigh the benefits.  They are in agreement with holding off on appetite stimulants at this point.  He reports continued nausea and constipation, but both have been managed with zofran and miralax. Overall he feels like he has improved over the last week, with more better days  than bad days. He continues to experience thick saliva, taste changes, and decreased appetite. He feels encouraged by his recent improvements. Anderson Malta Malmfelt will schedule him to see Sandi Mealy PA in one month for continued symptom management control. He will see me in mid December after a repeat CT scan of his neck. [facility] RN will navigate these orders. They know to call Anderson Malta or me if they have any further questions or concerns.   On date of service, in total, I spent 25 minutes on this encounter. Patient and his daughter were seen in person.  _____________________________________   Eppie Gibson, MD

## 2019-10-18 ENCOUNTER — Encounter: Payer: PPO | Admitting: Physical Therapy

## 2019-10-23 ENCOUNTER — Encounter: Payer: PPO | Admitting: Physical Therapy

## 2019-10-25 ENCOUNTER — Encounter: Payer: PPO | Admitting: Physical Therapy

## 2019-11-07 DIAGNOSIS — H6501 Acute serous otitis media, right ear: Secondary | ICD-10-CM | POA: Diagnosis not present

## 2019-11-07 DIAGNOSIS — Z85828 Personal history of other malignant neoplasm of skin: Secondary | ICD-10-CM | POA: Diagnosis not present

## 2019-11-07 DIAGNOSIS — H6123 Impacted cerumen, bilateral: Secondary | ICD-10-CM | POA: Diagnosis not present

## 2019-11-07 DIAGNOSIS — L988 Other specified disorders of the skin and subcutaneous tissue: Secondary | ICD-10-CM | POA: Diagnosis not present

## 2019-11-07 DIAGNOSIS — Z923 Personal history of irradiation: Secondary | ICD-10-CM | POA: Diagnosis not present

## 2019-11-09 DIAGNOSIS — H90A22 Sensorineural hearing loss, unilateral, left ear, with restricted hearing on the contralateral side: Secondary | ICD-10-CM | POA: Diagnosis not present

## 2019-11-09 DIAGNOSIS — H90A31 Mixed conductive and sensorineural hearing loss, unilateral, right ear with restricted hearing on the contralateral side: Secondary | ICD-10-CM | POA: Diagnosis not present

## 2019-11-13 DIAGNOSIS — Z8546 Personal history of malignant neoplasm of prostate: Secondary | ICD-10-CM | POA: Diagnosis not present

## 2019-11-14 ENCOUNTER — Ambulatory Visit: Payer: Self-pay | Admitting: Radiation Oncology

## 2019-11-14 DIAGNOSIS — Z23 Encounter for immunization: Secondary | ICD-10-CM | POA: Diagnosis not present

## 2019-11-16 DIAGNOSIS — H6501 Acute serous otitis media, right ear: Secondary | ICD-10-CM | POA: Diagnosis not present

## 2019-11-17 ENCOUNTER — Other Ambulatory Visit: Payer: Self-pay

## 2019-11-17 ENCOUNTER — Inpatient Hospital Stay: Payer: PPO | Attending: Radiation Oncology | Admitting: Medical

## 2019-11-17 ENCOUNTER — Inpatient Hospital Stay: Payer: PPO | Admitting: Nutrition

## 2019-11-17 DIAGNOSIS — Z79899 Other long term (current) drug therapy: Secondary | ICD-10-CM | POA: Insufficient documentation

## 2019-11-17 DIAGNOSIS — C4441 Basal cell carcinoma of skin of scalp and neck: Secondary | ICD-10-CM | POA: Insufficient documentation

## 2019-11-17 DIAGNOSIS — I890 Lymphedema, not elsewhere classified: Secondary | ICD-10-CM | POA: Diagnosis not present

## 2019-11-17 DIAGNOSIS — Z923 Personal history of irradiation: Secondary | ICD-10-CM | POA: Diagnosis not present

## 2019-11-17 DIAGNOSIS — Z7982 Long term (current) use of aspirin: Secondary | ICD-10-CM | POA: Insufficient documentation

## 2019-11-17 DIAGNOSIS — C109 Malignant neoplasm of oropharynx, unspecified: Secondary | ICD-10-CM | POA: Diagnosis not present

## 2019-11-17 DIAGNOSIS — C07 Malignant neoplasm of parotid gland: Secondary | ICD-10-CM

## 2019-11-17 MED ORDER — ONDANSETRON 8 MG PO TBDP: 8.0000 mg | ORAL_TABLET | Freq: Three times a day (TID) | ORAL | 1 refills | Status: DC | PRN

## 2019-11-17 NOTE — Progress Notes (Signed)
Nutrition follow-up completed with patient status post treatment for basal cell carcinoma of the skin of his neck. Weight improved and was documented as 169.5 pounds.  This is increased from 167.4 pounds on August 20. Patient reports he is pleased with weight gain and has a goal weight of 175 pounds. He is increasing his activity. Reports significant increase in oral intake to include 3 meals plus one snack.  He is drinking 2 Ensure Plus daily. He still struggles a bit with bread and pastries in terms of swallowing.   He denies nausea, vomiting, constipation and diarrhea however he continues to take nausea medication.  He states when he does not take it he experiences nausea.  Nutrition diagnosis: Food and nutrition related knowledge deficit resolved.  Provided support and encouragement for patient to continue increasing variety in diet.  Recommended patient continue Ensure Plus.  He should decrease to 1 carton a day as weight improves. Suggested he monitor nausea and decrease medication with improvement per MD.  Encourage patient to contact me with further questions and concerns.  **Disclaimer: This note was dictated with voice recognition software. Similar sounding words can inadvertently be transcribed and this note may contain transcription errors which may not have been corrected upon publication of note.**

## 2019-11-17 NOTE — Patient Instructions (Signed)

## 2019-11-20 DIAGNOSIS — Z8546 Personal history of malignant neoplasm of prostate: Secondary | ICD-10-CM | POA: Diagnosis not present

## 2019-11-20 DIAGNOSIS — Z87442 Personal history of urinary calculi: Secondary | ICD-10-CM | POA: Diagnosis not present

## 2019-11-20 DIAGNOSIS — N5201 Erectile dysfunction due to arterial insufficiency: Secondary | ICD-10-CM | POA: Diagnosis not present

## 2019-11-20 DIAGNOSIS — N393 Stress incontinence (female) (male): Secondary | ICD-10-CM | POA: Diagnosis not present

## 2019-11-21 NOTE — Progress Notes (Signed)
[place]       [phone] Fax:([phone]) [facility]  Oral and Oropharyngeal Cancer     CLINIC:     Survivorship      REASON FOR VISIT:   Routine follow-up for history of oral and oropharyngeal Cancer ( parotid gland )      BRIEF ONCOLOGIC HISTORY:    Oncology History  Basal cell carcinoma (BCC) of skin of neck  05/02/2019 Cancer Staging   Staging form: Cutaneous Carcinoma of the Head and Neck, AJCC 8th Edition - Clinical stage from 05/02/2019: Stage III (cT3, cN0, cM0) - Signed by Eppie Gibson, MD on 05/03/2019   05/03/2019 Initial Diagnosis   Basal cell carcinoma (BCC) of skin of neck   06/16/2019 Cancer Staging   Staging form: Cutaneous Carcinoma of the Head and Neck, AJCC 8th Edition - Pathologic stage from 06/16/2019: Stage III (pT3, pN1, cM0) - Signed by Eppie Gibson, MD on 06/20/2019         INTERVAL HISTORY:    Martin Curry is a 86 y.o. male with a diagnosis of a  Stage III (cT3, cN0, cM0) cancer of the parotid gland . He presents to clinic today for routine follow up having last been seen by Dr. Eppie Gibson on 10/13/2019. He had a tube placed in his right ear yesterday. He repoprts some right shoulder muscle pain. He continues to be troubled with thick saliva. He now has dentures and is eating better; He sees Dr. Sharlett Iles in November, Dr. Constance Holster in 6 weeks and Dr. Isidore Moos on 02/07/2020.      REVIEW OF SYSTEMS:   Visual changes:    no   Swelling of the face:    no   Swelling of the mouth:    no   Swelling of the throat:    no   Dental problems:    The patient has dentures   Skin changes at treatment site:  no   Dry mouth:     no   Difficulty talking:    no  Thickened saliva:    yes   Increased mucus:    no  Difficulty opening mouth:   no  Fatigue:     no   Pain or difficulty swallowing or chewing: no    Loss of  appetite:    no   Numbness of the ear:    no  Weakness raising the arm(s):  no  Lack of lip movement:    no  Facial disfigurement:    no  Numbness of face or neck:   no  Loss of voice or impaired speech:  no  Hearing difficulty:    no  Lymphedema:     no  Pain:      no        -Weight: has not lost weight      -Last ENT visit:    unknown   -Last Rad Onc visit:   10/13/2019   -Last Dentist visit:   Patient now has dentures     CURRENT MEDICATIONS:    Current Outpatient Medications on File Prior to Visit  Medication Sig Dispense Refill  . amLODipine (NORVASC) 5 MG tablet Take 5 mg by mouth daily.     [name] Kitchen aspirin 81 MG tablet Take 81 mg by mouth daily.    . benazepril (LOTENSIN) 10  MG tablet Take 10 mg by mouth daily.    [name] Kitchen levothyroxine (SYNTHROID) 25 MCG tablet Take 25 mcg by mouth daily before breakfast.    . Multiple Vitamin (MULTIVITAMIN) tablet Take 1 tablet by mouth daily.    [name] Kitchen POLYETHYLENE GLYCOL 3350 PO Take by mouth.    . potassium citrate (UROCIT-K) 10 MEQ (1080 MG) SR tablet Take 10 mEq by mouth 3 (three) times daily with meals.    . simvastatin (ZOCOR) 20 MG tablet Take 20 mg by mouth every evening.    . [DISCONTINUED] promethazine (PHENERGAN) 25 MG suppository Place 1 suppository (25 mg total) rectally every 6 (six) hours as needed for nausea or vomiting. (Patient not taking: Reported on 05/31/2019) 12 suppository 1   No current facility-administered medications on file prior to visit.        ALLERGIES:    Allergies  Allergen Reactions  . Penicillins     Did it involve swelling of the face/tongue/throat, SOB, or low BP? Unknown Did it involve sudden or severe rash/hives, skin peeling, or any reaction on the inside of your mouth or nose? Unknown Did you need to seek medical attention at a hospital or doctor's office? Unknown When did it last happen?Childhood allergy If all above answers are "NO", may proceed with cephalosporin use.   . Latex  Rash  . Tape Rash    Skin gets red and a rash develops with "plastic tape" Skin gets red and a rash develops with "plastic tape"        ADDITIONAL REVIEW OF SYSTEMS:    Review of Systems  Constitutional: Negative for chills, diaphoresis, fever, malaise/fatigue and weight loss.  HENT: Negative for ear pain, hearing loss, sinus pain and sore throat.   Respiratory: Negative for cough, sputum production, shortness of breath and wheezing.   Cardiovascular: Negative for chest pain, palpitations, orthopnea and leg swelling.  Gastrointestinal: Negative for constipation, diarrhea, nausea and vomiting.  Genitourinary: Negative for frequency and urgency.  Musculoskeletal: Positive for myalgias.  Skin: Negative for rash.  Neurological: Negative for dizziness, weakness and headaches.        PHYSICAL EXAM:    Vitals:   11/17/19 1314  BP: 112/66  Pulse: 73  Resp: 17  Temp: (!) 96.8 F (36 C)  SpO2: 98%    Filed Weights   11/17/19 1314  Weight: 169 lb 8 oz (76.9 kg)    Weight Date  169 pounds 8 ounces 11/17/2019  169 pounds 1.6 ounces 09/27/2019               Pre-treatment (RT consult date):     General: Martin Curry is a 86 y.o. male who appears to be in no acute distress.? Martin Curry is accompanied/unaccompanied today.    HEENT:    Head: atraumatic and normocephalic.?    Pupils: equal and reactive to light.    Conjunctivae: clear without exudate.?    Sclerae: anicteric.    Oral mucosa: pink and moist without lesions.?    Tongue: pink, moist, and midline.  No lesions.  Oropharynx: pink and moist, without lesions.   Lymph: No preauricular, postauricular, cervical, supraclavicular, or infraclavicular lymphadenopathy noted on palpation. ?   Neck: No palpable masses. Skin on neck is without thickening.  Well-healed surgical incision in the right lateral neck with a small area of scar tissue in the superior aspect of the incision.  Cardiovascular: Regular  rate and rhythm without murmurs, rubs, or gallops.[name] Kitchen   Respiratory: Clear to auscultation bilaterally  without wheezes, rales, or rhonchi. Chest expansion symmetric without accessory muscle use; breathing non-labored.?   GI: Abdomen soft and round. Bowel sounds normoactive. No hepatosplenomegaly, masses,  tenderness, rebound, guarding, or distention.   GU: Deferred. ?   Neuro: No focal deficits. Steady gait. ?   Psych: Normal mood and affect for situation.   Extremities: No edema.?   Skin: Warm and dry. No rashes or lesions.      LABORATORY DATA:    The patient's complete chemistry panel returned showing:   Lab Results  Component Value Date   NA 139 09/27/2019   NA 141 11/18/2011   K 4.1 09/27/2019   K 4.3 11/18/2011   CL 104 09/27/2019   CL 109 (H) 11/18/2011   CO2 25 09/27/2019   CO2 25 11/18/2011   GLUCOSE 139 (H) 09/27/2019   GLUCOSE 89 11/18/2011   BUN 19 10/12/2019   BUN 17.0 11/18/2011   CREATININE 1.44 (H) 10/12/2019   CREATININE 1.3 11/18/2011   ALBUMIN 3.8 09/27/2019   ALBUMIN 4.1 11/18/2011   ALKPHOS 90 09/27/2019   ALKPHOS 79 11/18/2011   ALT 36 09/27/2019   ALT 48 11/18/2011   AST 20 09/27/2019   AST 32 11/18/2011   BILITOT 0.7 09/27/2019   BILITOT 0.70 11/18/2011        The patient's TSH returned at: No results found for: TSH      The patient's complete blood count returned showing:      Component Value Date/Time   WBC 7.5 09/27/2019 1338   WBC 6.7 08/01/2019 1248   RBC 5.11 09/27/2019 1338   HGB 15.6 09/27/2019 1338   HGB 16.8 11/18/2011 1046   HCT 47.1 09/27/2019 1338   HCT 48.2 11/18/2011 1046   PLT 211 09/27/2019 1338   PLT 232 11/18/2011 1046   MCV 92.2 09/27/2019 1338   MCV 92.1 11/18/2011 1046   MCH 30.5 09/27/2019 1338   MCHC 33.1 09/27/2019 1338   RDW 13.9 09/27/2019 1338   RDW 13.9 11/18/2011 1046   LYMPHSABS 0.9 09/27/2019 1338   LYMPHSABS 1.4 11/18/2011 1046   MONOABS 0.6 09/27/2019 1338   MONOABS 0.6 11/18/2011  1046   EOSABS 0.1 09/27/2019 1338   EOSABS 0.2 11/18/2011 1046   BASOSABS 0.0 09/27/2019 1338   BASOSABS 0.0 11/18/2011 1046        DIAGNOSTIC IMAGING:    No results found.       ASSESSMENT & PLAN:    Martin Curry is a 86 y.o. male with a diagnosis of a Stage III (cT3, cN0, cM0) cancer of the parotid gland which was originally diagnosed on 05/02/2019. He was treated with radiation therapy which he completed treatment on 07/26/2019.  He presents to the survivorship clinic today for routine follow-up having last been seen on 10/13/2019.      1. Oral and oropharyngeal cancer ( 10/13/2019 ):  Mr. Lambertson is clinically without evidence of residual or recurrence cancer on physical exam today.         2. Nutritional status: Mr. Lukehart reports that he is currently able to consume adequate nutrition by mouth.  His weight is stable at 169 pounds 8 ounces today.  He was encouraged to continue to consume adequate hydration and nutrition, as tolerated.        3. At risk for dysphagia: Given Mr. Wandel treatment for oral and oropharyngeal cancer ( parotid gland ), which included radiation therapy, he maybe at risk for chronic dysphagia.  He reports having mild  difficulty with breads. He is able to consume soft foods and liquids without difficulty.  I encouraged him to continue to perform the swallowing exercises.        4.  At risk for neck lymphedema:  When patients with head & neck cancers are treated with surgery and/or radiation therapy, there is an associated increased risk of neck lymphedema.  Mr. Torrance reports that currently he is not experiencing symptoms.He has a neck compression garment and reports wearing it, as directed.  I encouraged Mr. Nohr to continue to wear the compression garment and practice the massage techniques to reduce the presence of lymphedema.  If his symptoms worsen, I would happy to place a formal referral to physical therapy for further evaluation and treatment.         DISPOSITION:    -See Dr. Constance Holster (ENT) in 6 weeks  -Return to cancer center to see Dr. Isidore Moos on 02/07/2020.   -Return to cancer center to see Survivorship PA in 6 months.       A total of 45 minutes was spent in the face-to-face care of this patient, with greater than 50% of that time spent in counseling and care-coordination.       Sandi Mealy, MHS, PA-C  11/21/19   1:02 PM        NOTE: PRIMARY CARE PROVIDER   Leanna Battles, MD   Phone: [phone]   Fax: [phone]

## 2019-11-27 ENCOUNTER — Other Ambulatory Visit: Payer: Self-pay

## 2019-11-27 DIAGNOSIS — C07 Malignant neoplasm of parotid gland: Secondary | ICD-10-CM

## 2019-12-14 DIAGNOSIS — M6281 Muscle weakness (generalized): Secondary | ICD-10-CM | POA: Diagnosis not present

## 2019-12-14 DIAGNOSIS — R2689 Other abnormalities of gait and mobility: Secondary | ICD-10-CM | POA: Diagnosis not present

## 2019-12-14 DIAGNOSIS — C07 Malignant neoplasm of parotid gland: Secondary | ICD-10-CM | POA: Diagnosis not present

## 2019-12-16 DIAGNOSIS — R2689 Other abnormalities of gait and mobility: Secondary | ICD-10-CM | POA: Diagnosis not present

## 2019-12-16 DIAGNOSIS — C07 Malignant neoplasm of parotid gland: Secondary | ICD-10-CM | POA: Diagnosis not present

## 2019-12-16 DIAGNOSIS — M6281 Muscle weakness (generalized): Secondary | ICD-10-CM | POA: Diagnosis not present

## 2019-12-20 ENCOUNTER — Ambulatory Visit: Payer: PPO | Admitting: Dermatology

## 2019-12-20 ENCOUNTER — Encounter: Payer: Self-pay | Admitting: Dermatology

## 2019-12-20 ENCOUNTER — Other Ambulatory Visit: Payer: Self-pay

## 2019-12-20 DIAGNOSIS — C4441 Basal cell carcinoma of skin of scalp and neck: Secondary | ICD-10-CM

## 2019-12-20 DIAGNOSIS — C4492 Squamous cell carcinoma of skin, unspecified: Secondary | ICD-10-CM

## 2019-12-20 DIAGNOSIS — C4491 Basal cell carcinoma of skin, unspecified: Secondary | ICD-10-CM

## 2019-12-20 DIAGNOSIS — L821 Other seborrheic keratosis: Secondary | ICD-10-CM | POA: Diagnosis not present

## 2019-12-20 DIAGNOSIS — D0439 Carcinoma in situ of skin of other parts of face: Secondary | ICD-10-CM | POA: Diagnosis not present

## 2019-12-20 DIAGNOSIS — Z1283 Encounter for screening for malignant neoplasm of skin: Secondary | ICD-10-CM

## 2019-12-20 DIAGNOSIS — D485 Neoplasm of uncertain behavior of skin: Secondary | ICD-10-CM

## 2019-12-20 DIAGNOSIS — Z8589 Personal history of malignant neoplasm of other organs and systems: Secondary | ICD-10-CM

## 2019-12-20 DIAGNOSIS — Z85828 Personal history of other malignant neoplasm of skin: Secondary | ICD-10-CM | POA: Diagnosis not present

## 2019-12-20 HISTORY — DX: Squamous cell carcinoma of skin, unspecified: C44.92

## 2019-12-20 HISTORY — DX: Basal cell carcinoma of skin, unspecified: C44.91

## 2019-12-20 NOTE — Patient Instructions (Signed)

## 2019-12-23 DIAGNOSIS — R2689 Other abnormalities of gait and mobility: Secondary | ICD-10-CM | POA: Diagnosis not present

## 2019-12-23 DIAGNOSIS — C07 Malignant neoplasm of parotid gland: Secondary | ICD-10-CM | POA: Diagnosis not present

## 2019-12-23 DIAGNOSIS — M6281 Muscle weakness (generalized): Secondary | ICD-10-CM | POA: Diagnosis not present

## 2019-12-26 ENCOUNTER — Telehealth: Payer: Self-pay

## 2019-12-26 NOTE — Telephone Encounter (Signed)
Phone call to patient with his pathology results. Patient aware of results.  

## 2019-12-26 NOTE — Telephone Encounter (Signed)
-----   Message from Lavonna Monarch, MD sent at 12/26/2019  5:47 AM EDT ----- Schedule surgery with Dr. Darene Lamer

## 2019-12-28 DIAGNOSIS — C4441 Basal cell carcinoma of skin of scalp and neck: Secondary | ICD-10-CM | POA: Diagnosis not present

## 2019-12-28 DIAGNOSIS — H6981 Other specified disorders of Eustachian tube, right ear: Secondary | ICD-10-CM | POA: Diagnosis not present

## 2019-12-28 DIAGNOSIS — M259 Joint disorder, unspecified: Secondary | ICD-10-CM | POA: Diagnosis not present

## 2020-01-12 DIAGNOSIS — E039 Hypothyroidism, unspecified: Secondary | ICD-10-CM | POA: Diagnosis not present

## 2020-01-12 DIAGNOSIS — E785 Hyperlipidemia, unspecified: Secondary | ICD-10-CM | POA: Diagnosis not present

## 2020-01-23 DIAGNOSIS — E440 Moderate protein-calorie malnutrition: Secondary | ICD-10-CM | POA: Diagnosis not present

## 2020-01-23 DIAGNOSIS — C61 Malignant neoplasm of prostate: Secondary | ICD-10-CM | POA: Diagnosis not present

## 2020-01-23 DIAGNOSIS — E785 Hyperlipidemia, unspecified: Secondary | ICD-10-CM | POA: Diagnosis not present

## 2020-01-23 DIAGNOSIS — Z0000 Encounter for general adult medical examination without abnormal findings: Secondary | ICD-10-CM | POA: Diagnosis not present

## 2020-01-23 DIAGNOSIS — I700 Atherosclerosis of aorta: Secondary | ICD-10-CM | POA: Diagnosis not present

## 2020-01-23 DIAGNOSIS — Z85828 Personal history of other malignant neoplasm of skin: Secondary | ICD-10-CM | POA: Diagnosis not present

## 2020-01-23 DIAGNOSIS — E039 Hypothyroidism, unspecified: Secondary | ICD-10-CM | POA: Diagnosis not present

## 2020-01-23 DIAGNOSIS — N1831 Chronic kidney disease, stage 3a: Secondary | ICD-10-CM | POA: Diagnosis not present

## 2020-01-23 DIAGNOSIS — I129 Hypertensive chronic kidney disease with stage 1 through stage 4 chronic kidney disease, or unspecified chronic kidney disease: Secondary | ICD-10-CM | POA: Diagnosis not present

## 2020-01-23 DIAGNOSIS — R82998 Other abnormal findings in urine: Secondary | ICD-10-CM | POA: Diagnosis not present

## 2020-01-30 DIAGNOSIS — Z1212 Encounter for screening for malignant neoplasm of rectum: Secondary | ICD-10-CM | POA: Diagnosis not present

## 2020-02-01 ENCOUNTER — Ambulatory Visit
Admission: RE | Admit: 2020-02-01 | Discharge: 2020-02-01 | Disposition: A | Discharge status: Home / Self Care | Payer: PPO | Source: Ambulatory Visit | Attending: Radiation Oncology | Admitting: Radiation Oncology

## 2020-02-01 ENCOUNTER — Other Ambulatory Visit: Payer: Self-pay

## 2020-02-01 DIAGNOSIS — C07 Malignant neoplasm of parotid gland: Secondary | ICD-10-CM

## 2020-02-01 DIAGNOSIS — C4441 Basal cell carcinoma of skin of scalp and neck: Secondary | ICD-10-CM | POA: Insufficient documentation

## 2020-02-01 LAB — BUN & CREATININE (CHCC)
BUN: 15 mg/dL (ref 8–23)
Creatinine: 1.25 mg/dL — ABNORMAL HIGH (ref 0.61–1.24)
GFR, Estimated: 56 mL/min — ABNORMAL LOW (ref >60)

## 2020-02-02 ENCOUNTER — Ambulatory Visit (HOSPITAL_COMMUNITY)
Admission: RE | Admit: 2020-02-02 | Discharge: 2020-02-02 | Disposition: A | Discharge status: Home / Self Care | Payer: PPO | Source: Ambulatory Visit | Attending: Radiation Oncology | Admitting: Radiation Oncology

## 2020-02-02 ENCOUNTER — Encounter (HOSPITAL_COMMUNITY): Payer: Self-pay

## 2020-02-02 ENCOUNTER — Ambulatory Visit: Payer: PPO

## 2020-02-02 DIAGNOSIS — Z85818 Personal history of malignant neoplasm of other sites of lip, oral cavity, and pharynx: Secondary | ICD-10-CM | POA: Diagnosis not present

## 2020-02-02 DIAGNOSIS — K118 Other diseases of salivary glands: Secondary | ICD-10-CM | POA: Diagnosis not present

## 2020-02-02 DIAGNOSIS — C07 Malignant neoplasm of parotid gland: Secondary | ICD-10-CM | POA: Diagnosis not present

## 2020-02-02 DIAGNOSIS — Z85828 Personal history of other malignant neoplasm of skin: Secondary | ICD-10-CM | POA: Diagnosis not present

## 2020-02-02 DIAGNOSIS — E049 Nontoxic goiter, unspecified: Secondary | ICD-10-CM | POA: Diagnosis not present

## 2020-02-02 MED ORDER — IOHEXOL 300 MG/ML  SOLN
75.0000 mL | Freq: Once | INTRAMUSCULAR | Status: AC | PRN
  Administered 2020-02-02: 75 mL via INTRAVENOUS

## 2020-02-07 ENCOUNTER — Ambulatory Visit
Admission: RE | Admit: 2020-02-07 | Discharge: 2020-02-07 | Disposition: A | Discharge status: Home / Self Care | Payer: PPO | Source: Ambulatory Visit | Attending: Radiation Oncology | Admitting: Radiation Oncology

## 2020-02-07 ENCOUNTER — Other Ambulatory Visit: Payer: Self-pay

## 2020-02-07 VITALS — BP 120/74 | HR 73 | Temp 97.7°F | Resp 18 | Ht 69.0 in | Wt 169.8 lb

## 2020-02-07 DIAGNOSIS — Z923 Personal history of irradiation: Secondary | ICD-10-CM | POA: Diagnosis not present

## 2020-02-07 DIAGNOSIS — Z08 Encounter for follow-up examination after completed treatment for malignant neoplasm: Secondary | ICD-10-CM | POA: Diagnosis not present

## 2020-02-07 DIAGNOSIS — Z7982 Long term (current) use of aspirin: Secondary | ICD-10-CM | POA: Insufficient documentation

## 2020-02-07 DIAGNOSIS — J984 Other disorders of lung: Secondary | ICD-10-CM | POA: Diagnosis not present

## 2020-02-07 DIAGNOSIS — Z85828 Personal history of other malignant neoplasm of skin: Secondary | ICD-10-CM | POA: Diagnosis not present

## 2020-02-07 DIAGNOSIS — Z79899 Other long term (current) drug therapy: Secondary | ICD-10-CM | POA: Diagnosis not present

## 2020-02-07 DIAGNOSIS — C4441 Basal cell carcinoma of skin of scalp and neck: Secondary | ICD-10-CM

## 2020-02-07 DIAGNOSIS — C07 Malignant neoplasm of parotid gland: Secondary | ICD-10-CM

## 2020-02-07 NOTE — Progress Notes (Signed)
Martin Curry presents today for follow-up after completing radiation to the right neck/scalp on 07/26/2019 and to review CT scan from 02/02/2020  Pain issues, if any: Patient denies Using a feeding tube?: N/A Weight changes, if any:  Wt Readings from Last 3 Encounters:  02/07/20 169 lb 12.8 oz (77 kg)  11/17/19 169 lb 8 oz (76.9 kg)  10/13/19 167 lb 6.4 oz (75.9 kg)   Swallowing issues, if any: Overall he feels he can eat a wider variety of foods (though drier breads are still difficult). Smoking or chewing tobacco? None Using fluoride trays daily? N/A Last ENT visit was on: 12/28/2019 Saw Dr. Izora Gala: "On exam, overall looks very healthy. Voice and breathing are clear. There is no palpable neck masses. The hypertrophic area under the scar seems about stable. There is no new masses. The surgical site looks excellent. The right ear canal is clear except for some dried blood and wax right up against the ventilation tube. Recommend eardrops for the right ear for about 4 more days to see if that will help clear up the tube. If that does not help the hearing then recheck in a couple weeks. Otherwise plan to follow-up in 3 months. He has his follow-up scan coming up next month and I will keep an eye out for that. 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 Constance Holster again next month  Other notable issues, if any: Reports his appetite has returned and he enjoys eating again (no longer needs to utilize Ensure supplements). Denies any ear or jaw pain, but does report occasional difficulty fully opening his mouth fully. No longer needs Zofran which has allowed him to stop needing Miralax. Denies any issues with lymphedema to his neck/jaw. Overall he reports he is doing very well and pleased with how he's recovered so far  Vitals:   02/07/20 1524  BP: 120/74  Pulse: 73  Resp: 18   Temp: 97.7 F (36.5 C)  SpO2: 98%

## 2020-02-09 NOTE — Progress Notes (Signed)
Radiation Oncology         [phone] ________________________________  Name: DERRIUS FURTICK MRN: [id number]  Date: 02/07/2020  DOB: 1932-06-22  Follow-Up Visit Note  Outpatient  CC: Leanna Battles, MD  Cindra Presume, MD  Diagnosis and Prior Radiotherapy:    ICD-10-CM   1. Basal cell carcinoma (BCC) of skin of neck  C44.41   2. 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     Cancer Staging: Cancer Staging Basal cell carcinoma (BCC) of skin of neck Staging form: Cutaneous Carcinoma of the Head and Neck, AJCC 8th Edition - Clinical stage from 05/02/2019: Stage III (cT3, cN0, cM0) - Signed by Eppie Gibson, MD on 05/03/2019 - Pathologic stage from 06/16/2019: Stage III (pT3, pN1, cM0) - Signed by Eppie Gibson, MD on 06/20/2019   Intent: Curative  Radiation Treatment Dates: 06/28/2019 through 07/26/2019  Site Technique Total Dose (Gy) Dose per Fx (Gy) Completed Fx Beam Energies  Neck Right: HN_Rt IMRT 54/54 2.7 20/20 6X       CHIEF COMPLAINT: Here for follow-up and surveillance of neck cancer  Narrative:     Mr. Gilliam presents today for follow-up after completing radiation to the right neck/scalp on 07/26/2019 and to review CT scan from 02/02/2020  Pain issues, if any: Patient denies Using a feeding tube?: N/A Weight changes, if any:  Wt Readings from Last 3 Encounters:  02/07/20 169 lb 12.8 oz (77 kg)  11/17/19 169 lb 8 oz (76.9 kg)  10/13/19 167 lb 6.4 oz (75.9 kg)   Swallowing issues, if any: Overall he feels he can eat a wider variety of foods (though drier breads are still difficult). Smoking or chewing tobacco? None Using fluoride trays daily? N/A Last ENT visit was on: 12/28/2019 Saw Dr. Izora Gala: "On exam, overall looks very healthy. Voice and breathing are clear. There is no palpable neck masses. The hypertrophic area under the scar seems about stable. There is no new masses. The surgical site looks excellent. The right ear canal is clear except for some dried blood  and wax right up against the ventilation tube. Recommend eardrops for the right ear for about 4 more days to see if that will help clear up the tube. If that does not help the hearing then recheck in a couple weeks. Otherwise plan to follow-up in 3 months. He has his follow-up scan coming up next month and I will keep an eye out for that. 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 Constance Holster again next month  Other notable issues, if any: Reports his appetite has returned and he enjoys eating again (no longer needs to utilize Ensure supplements). Denies any ear or jaw pain, but does report occasional difficulty fully opening his mouth fully. No longer needs Zofran which has allowed him to stop needing Miralax. Denies any issues with lymphedema to his neck/jaw. Overall he reports he is doing very well and pleased with how he's recovered so far  Vitals:   02/07/20 1524  BP: 120/74  Pulse: 73  Resp: 18  Temp: 97.7 F (36.5 C)  SpO2: 98%    ALLERGIES:  is allergic to penicillins, latex, and tape.  Meds: Current Outpatient Medications  Medication Sig Dispense Refill  . amLODipine (NORVASC) 5 MG tablet Take 5 mg by mouth daily.     [name] Kitchen aspirin 81 MG tablet Take 81 mg by mouth daily.    [name] Kitchen  benazepril (LOTENSIN) 10 MG tablet Take 10 mg by mouth daily.    [name] Kitchen levothyroxine (SYNTHROID) 25 MCG tablet Take 25 mcg by mouth daily before breakfast.    . Multiple Vitamin (MULTIVITAMIN) tablet Take 1 tablet by mouth daily.    . potassium citrate (UROCIT-K) 10 MEQ (1080 MG) SR tablet Take 10 mEq by mouth 3 (three) times daily with meals.    . simvastatin (ZOCOR) 20 MG tablet Take 20 mg by mouth every evening.     No current facility-administered medications for this encounter.    Physical Findings: General : the patient is in no acute distress. Patient is alert and oriented.   height is 5\' 9"  (1.753 m) and weight is 169 lb 12.8 oz (77 kg). His temporal temperature is 97.7 F (36.5 C). His blood pressure is 120/74 and his pulse is 73. His respiration is 18 and oxygen saturation is 98%. .     Neck: No palpable masses in the preauricular, postauricular, cervical, or supraclavicular regions.   Skin: no concerning skin lesions over the neck Psychiatric: Alert and oriented, no acute distress, judgment and insight appear intact MSK: Ambulatory independently  Lab Findings: Lab Results  Component Value Date   WBC 7.5 09/27/2019   HGB 15.6 09/27/2019   HCT 47.1 09/27/2019   MCV 92.2 09/27/2019   PLT 211 09/27/2019    Radiographic Findings: CT Soft Tissue Neck W Contrast  Result Date: 02/02/2020 CLINICAL DATA:  86 year old male with history of treated parotid/skin cancer. Restaging. EXAM: CT NECK WITH CONTRAST TECHNIQUE: Multidetector CT imaging of the neck was performed using the standard protocol following the bolus administration of intravenous contrast. CONTRAST:  42mL OMNIPAQUE IOHEXOL 300 MG/ML  SOLN COMPARISON:  Neck CT 10/12/2019 and earlier. FINDINGS: Pharynx and larynx: Laryngeal and pharyngeal soft tissue contours are stable since August. Parapharyngeal and retropharyngeal spaces remain negative. Salivary glands: Negative sublingual space. Stable submandibular glands, each partially atrophied and with ductal ectasia more pronounced on the left. Left parotid gland appears stable and within normal limits. Sequelae of right parotidectomy. Regression of the mild nodular soft tissue changes previously noted along the posteroinferior surgical margin (series 3, image 46 today). Right stylomastoid foramen appears stable, within normal limits. No masslike enhancement or new/increased soft tissue identified in the region. Thyroid: Stable thyroid goiter. Lymph nodes: Postoperative and post radiation changes suspected on the right side. No discrete right cervical lymph node can  be identified other than at the level 1 station (stable). Left side cervical nodes remains small, normal. No cervical lymphadenopathy. Vascular: Major vascular structures in the neck and at the skull base remain patent. Limited intracranial: Negative. Visualized orbits: Stable, negative. Mastoids and visualized paranasal sinuses: Regressed right mastoid effusion, mild residual. Other visualized paranasal sinuses and mastoids are stable and well pneumatized. Skeleton: Absent dentition. Stable visualized osseous structures. No acute or suspicious osseous lesion identified. Upper chest: Stable mild apical lung scarring. Calcified aortic atherosclerosis. Small partially calcified anterior carina mediastinal lymph node is stable since February and appears postinflammatory/post granulomatous (series 3, image 116). No superior mediastinal lymphadenopathy. IMPRESSION: 1. NI-RADS 1 - satisfactory post treatment appearance of the right neck. Small area of postoperative soft tissue nodularity has regressed since August, with no new or suspicious findings. 2. Regression of probable post radiation right mastoid effusion. 3. Aortic Atherosclerosis (ICD10-I70.0). Electronically Signed   By: Genevie Ann [name]   On: 02/02/2020 21:31    Impression/Plan: This is a very pleasant 86 year old gentleman with a history of locally  advanced basal cell carcinoma of the right neck.   I personally reviewed his imaging.   This is also reviewed at his tumor board today.  There is no evidence of disease.  From this point on he will only need imaging as clinically indicated and he will continue to follow regularly with otolaryngology for physical exams.  Symptomatically he is doing so much better.  He has recovered beautifully from his treatment.  He will continue to follow with ENT and with Sandi Mealy of our head and neck survivorship program at the cancer center. I will see him back in 65mo.   On date of service, in total, I spent 25 minutes  on this encounter. Patient was seen in person.  _____________________________________   Eppie Gibson, MD

## 2020-02-20 ENCOUNTER — Encounter: Payer: Self-pay | Admitting: Occupational Therapy

## 2020-02-20 ENCOUNTER — Ambulatory Visit: Payer: PPO | Attending: Otolaryngology | Admitting: Occupational Therapy

## 2020-02-20 ENCOUNTER — Other Ambulatory Visit: Payer: Self-pay

## 2020-02-20 DIAGNOSIS — M25511 Pain in right shoulder: Secondary | ICD-10-CM | POA: Diagnosis not present

## 2020-02-20 DIAGNOSIS — M6281 Muscle weakness (generalized): Secondary | ICD-10-CM | POA: Diagnosis not present

## 2020-02-20 DIAGNOSIS — R29898 Other symptoms and signs involving the musculoskeletal system: Secondary | ICD-10-CM | POA: Diagnosis not present

## 2020-02-20 NOTE — Therapy (Signed)
[place] [place], [place], [address] Phone: [phone]   Fax:  [phone]  Occupational Therapy Evaluation  Patient Details  Name: Martin Curry MRN: [id number] Date of Birth: 1932-03-31 Referring Provider (OT): Izora Gala, MD   Encounter Date: 02/20/2020   OT End of Session - 02/20/20 0925    Visit Number 1    Number of Visits 9   Pt requested limiting visits due to insurance resetting in new year   Date for OT Re-Evaluation 05/01/20    Authorization Type [employer]    OT Start Time 0930    OT Stop Time 1018    OT Time Calculation (min) 48 min    Equipment Utilized During Treatment goniometer, dynamometer    Activity Tolerance Patient tolerated treatment well    Behavior During Therapy [facility] for tasks assessed/performed           Past Medical History:  Diagnosis Date  . Atypical nevus 09/07/2002   left abdomen-slight  . BCC (basal cell carcinoma of skin) 07/19/2014   left sideburn- +margin-exc.  [name] Kitchen BCC (basal cell carcinoma) 09/07/2002   right preauricular-exc., left crown of scalp-CX35FU  . BCC (basal cell carcinoma) 10/30/2003   right forehead-MOHS  . BCC (basal cell carcinoma) 01/07/2009   left scalp, left forearm  . BCC (basal cell carcinoma) 09/27/2014   left sideburn-free  . BCC (basal cell carcinoma) 07/16/2015   left scalp  . 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 History of kidney stones    Many years ago  . Hyperlipidemia   . Hypertension   . Hypothyroidism   . Nodular basal cell carcinoma (BCC) 11/21/2012   right side of scalp-CX35FU  . Nodular basal cell carcinoma (BCC) 06/12/2014   left sideburn-CX35FU/exc.  . Nodular basal cell carcinoma (BCC) 07/16/2015   left scalp-CX35FU  . Polycythemia   . Prostate cancer ([place]) 1990's   total prostatectomy and adjuvant radiation. Last PSA was 0.02 in 09/2011  . SCC (squamous cell carcinoma) 04/03/2009   forehead-txpbx  . SCC  (squamous cell carcinoma) 07/16/2015   left front scalp  . SCC (squamous cell carcinoma) 07/16/2015   left front scalp-Cx35FU  . SCCA (squamous cell carcinoma) of skin 12/20/2019   Right forehead (in situ)  . Superficial basal cell carcinoma (BCC) 11/21/2012   behind left ear-CX35FU, midback-CX35FU, right forehead-CX35FU-exc  . Superficial basal cell carcinoma (BCC) 11/02/2017   left post neck-CX35FU  . Superficial nodular basal cell carcinoma (BCC) 12/20/2019   Left temporal scalp    Past Surgical History:  Procedure Laterality Date  . ADJACENT TISSUE TRANSFER/TISSUE REARRANGEMENT N/A 05/22/2019   Procedure: COMPLEX CLOSURE OF NECK WOUND;  Surgeon: Cindra Presume, MD;  Location: [place];  Service: Plastics;  Laterality: N/A;  . ALLOGRAFT APPLICATION N/A [id number]   Procedure: POSSIBLE FACIAL NERVE RECONSTRUCTION WITH NERVE ALLOGRAFT VS AUTOGRAFT;  Surgeon: Cindra Presume, MD;  Location: [place];  Service: Plastics;  Laterality: N/A;  . BLADDER SURGERY     due to prostatectomy.   [name] Kitchen CATARACT EXTRACTION     right  . COLONOSCOPY     neg in the past; due in 2014 with [place] Dr. Sharlett Iles  . EYE SURGERY     50 years ago.   [name] Kitchen HERNIA REPAIR    . MOHS SURGERY    . NECK DISSECTION  05/22/2019    NECK DISSECTION  . PAROTIDECTOMY  05/22/2019   PAROTIDECTOMY with facial nerve  dissection   . PAROTIDECTOMY Right 05/22/2019   Procedure: PAROTIDECTOMY;  Surgeon: Serena Colonelosen, Jefry, MD;  [facility];  Service: ENT;  Laterality: Right;  . PROSTATE SURGERY    . RADICAL NECK DISSECTION Right 05/22/2019   Procedure: RADICAL NECK DISSECTION;  Surgeon: Serena Colonelosen, Jefry, MD;  [facility];  Service: ENT;  Laterality: Right;  . TONSILLECTOMY      There were no vitals filed for this visit.   Subjective Assessment - 02/20/20 0924    Subjective  Pt reports R shoulder pain that is only aggravated by specific activities and that he has started using his non-dominant LUE for more tasks    Pertinent History  sx removal of spinal accessory n. secondary to parotidectomy    Patient Stated Goals "Is it a pipe dream to get back to normal?" I would like to build up my stamina.    Currently in Pain? No/denies             [facility] OT Assessment - 02/20/20 0926      Assessment   Medical Diagnosis R shoulder dysfunction    Referring Provider (OT) Serena ColonelJefry Rosen, MD    Onset Date/Surgical Date 05/22/19    Hand Dominance Right    Prior Therapy OP PT and HHPT      Precautions   Precautions None      Balance Screen   Has the patient fallen in the past 6 months No      Home  Environment   Family/patient expects to be discharged to: Private residence    Living Arrangements Spouse/significant other    Type of Home House    Home Layout Two level    Additional Comments Primary caregiver for wife who has a progressive disease      Prior Function   Level of Independence Independent    Vocation Retired    Tour managerLeisure lawn care and "fiddling in the yard;" reading (has an Investment banker, operationalipad)      ADL   ADL comments Pt reports no difficulties with ADL      IADL   Prior Level of Administrator, artsunction Shopping Ind    Shopping Takes care of all shopping needs independently    Prior Level of Function Light Housekeeping Ind    Light Housekeeping Performs light daily tasks such as dishwashing, bed making;Performs light daily tasks but cannot maintain acceptable level of cleanliness   Occasionally experiences difficulty with tasks due to R shoulder limitations, but reports still being able to complete activities with extra time   Prior Level of Function Meal Prep Ind    Meal Prep Plans, prepares and serves adequate meals independently   Also completes meal prep for wife   Prior Level of Function Engineer, civil (consulting)Community Mobility Ind    Community Mobility Drives own vehicle    Prior Level of Function Medication Managment Ind    Medication Management Is responsible for taking medication in correct dosages at correct time    Prior Level of Engineer, structuralunction  Financial Management Ind    Financial Management Manages financial matters independently (budgets, writes checks, pays rent, bills goes to [employer]), collects and keeps track of income      Vision - History   Baseline Vision Wears glasses all the time    Visual History Cataracts   R eye   Additional Comments Blindness in L eye since childhood      Sensation   Additional Comments Reports numbness on R side of face since sx; does not report any  changes in sensation of RUE      Coordination   Gross Motor Movements are Fluid and Coordinated Yes      AROM   Overall AROM  Deficits    Overall AROM Comments AROM significantly limited in R shoulder flexion/abduction; No difficulties with R shoulder internal/external rotation and L shoulder AROM WFL   Pt reports discomfort with shoulder flexion and abduction   AROM Assessment Site Shoulder;Cervical    Right/Left Shoulder Right;Left    Right Shoulder Flexion 100 Degrees    Right Shoulder ABduction 72 Degrees    Left Shoulder Flexion 130 Degrees    Left Shoulder ABduction 100 Degrees    Cervical Flexion WFL    Cervical - Right Side Bend 28    Cervical - Left Side Bend 14      Strength   Strength Assessment Site Shoulder    Right/Left Shoulder Right;Left    Right Shoulder Flexion 4-/5    Right Shoulder Extension 4/5    Right Shoulder ABduction 2+/5    Right Shoulder Horizontal ABduction 4+/5    Right Shoulder Horizontal ADduction 4+/5    Left Shoulder Flexion 4+/5    Left Shoulder Extension 4+/5    Left Shoulder ABduction 4+/5    Left Shoulder Horizontal ABduction 4/5    Left Shoulder Horizontal ADduction 4+/5      Hand Function   Right Hand Gross Grasp Functional    Right Hand Grip (lbs) 54    Left Hand Gross Grasp Functional    Left Hand Grip (lbs) 65             OT Education - 02/20/20 0924    Education Details Education provided on role/purpose of OT and potential functional limitations associated with condition/symptoms     Person(s) Educated Patient    Methods Explanation;Demonstration    Comprehension Verbalized understanding            OT Short Term Goals - 02/21/20 1318      OT SHORT TERM GOAL #1   Title Pt will improve functional reach during light housekeeping tasks by increasing AROM of R shoulder flexion by at least 10 degrees    Baseline R shoulder flexion 100 degrees    Time 4    Period Weeks    Status New    Target Date 03/22/20      OT SHORT TERM GOAL #2   Title Pt will improve sustained grasp of medium-weight bags by increasing grip strength of dominant R hand by at least 10 lbs.    Baseline R hand (dominant) - 54 lbs; L hand - 65 lbs    Time 4    Period Weeks    Status New      OT SHORT TERM GOAL #3   Title Pt will improve functional RUE use during ADLs by improving strength of R shoulder abduction to at least 3-/5    Baseline Pt unable to move > half ROM against gravity (2+/5)    Time 4    Period Weeks    Status New             OT Long Term Goals - 02/21/20 1322      OT LONG TERM GOAL #1   Title Pt will be independent with home carryover of RUE HEP    Time 8    Period Weeks    Status New    Target Date 04/19/20      OT LONG TERM GOAL #2  Title Pt will retrieve lightweight items at or above chest height using RUE with pain less than 4/10 greater than 75% of the time    Baseline Pt reports being unable to lift objects at/above shoulder height with RUE    Time 8    Period Weeks    Status New      OT LONG TERM GOAL #3   Title Pt will improve functional UE use during leisure and caregiving activities by improving AROM of bilateral shoulder abduction by at least 10 degrees    Baseline R shoulder abduction - 72 degrees; L shoulder abduction 100 degrees    Time 8    Period Weeks    Status New      OT LONG TERM GOAL #4   Title Pt will independently identify at least 5 adaptive/compensatory strategies and/or pain management techniques to improve safety and facilitate  pain relief during ADLs    Time 8    Period Weeks    Status New      OT LONG TERM GOAL #5   Title Pt will tolerate at least 10 min of physical activity within pt level of satisfaction to improve generalized endurance for participation in lawn care activities   pt stated goal   Time 8    Period Weeks    Status New             Plan - 02/20/20 0925    Clinical Impression Statement Pt is an 86 y.o. male who presents to OP OT with R shoulder limitations secondary to surgical removal of the spinal accessory n. during parotidectomy on 05/22/19. PMHx includes cancer of parotid gland, basal cell carcinoma of skin of neck, and blindness in L eye. Pt lives in a two-story home with his wife. His wife has PSP and he is currently her primary caregiver. Pt will benefit from skilled occupational therapy services to address decreased ROM and strength of R shoulder, pain management, compensatory/adaptive strategies, generalized endurance, and training to improve performance in desired ADL/IADLs.    OT Occupational Profile and History Detailed Assessment- Review of Records and additional review of physical, cognitive, psychosocial history related to current functional performance    Occupational performance deficits (Please refer to evaluation for details): ADL's;IADL's;Leisure    Body Structure / Function / Physical Skills ADL;Decreased knowledge of use of DME;Strength;GMC;Pain;UE functional use;Body mechanics;Endurance;IADL;ROM    Psychosocial Skills Environmental  Adaptations    Rehab Potential Good    Clinical Decision Making Several treatment options, min-mod task modification necessary    Comorbidities Affecting Occupational Performance: May have comorbidities impacting occupational performance    Modification or Assistance to Complete Evaluation  No modification of tasks or assist necessary to complete eval    OT Frequency 1x / week    OT Duration 8 weeks    OT Treatment/Interventions Self-care/ADL  training;Moist Heat;DME and/or AE instruction;Contrast Bath;Compression bandaging;Therapeutic activities;Ultrasound;Therapeutic exercise;Cryotherapy;Iontophoresis;Electrical Stimulation;Paraffin;Energy conservation;Manual Therapy;Patient/family education;Passive range of motion    Plan shoulder AROM/AAROM exercises; pt will bring in a list next visit of daily tasks when he notices R shoulder pain most    Consulted and Agree with Plan of Care Patient           Patient will benefit from skilled therapeutic intervention in order to improve the following deficits and impairments:   Body Structure / Function / Physical Skills: ADL,Decreased knowledge of use of DME,Strength,GMC,Pain,UE functional use,Body mechanics,Endurance,IADL,ROM   Psychosocial Skills: Environmental  Adaptations   Visit Diagnosis: Muscle weakness (generalized)  Right  shoulder pain, unspecified chronicity  Other symptoms and signs involving the musculoskeletal system    Problem List Patient Active Problem List   Diagnosis Date Noted  . 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 Basal cell carcinoma (BCC) of skin of neck 05/03/2019  . Polycythemia     Rosie Fate, OTR/L, MSOT 02/21/2020, 1:25 PM  [facility] [address] [place], [place], [address] Phone: [phone]   Fax:  [phone]  Name: LABIB CWYNAR MRN: [id number] Date of Birth: November 23, 1932

## 2020-02-21 ENCOUNTER — Ambulatory Visit (INDEPENDENT_AMBULATORY_CARE_PROVIDER_SITE_OTHER): Payer: PPO | Admitting: Dermatology

## 2020-02-21 DIAGNOSIS — D0439 Carcinoma in situ of skin of other parts of face: Secondary | ICD-10-CM | POA: Diagnosis not present

## 2020-02-21 DIAGNOSIS — C4441 Basal cell carcinoma of skin of scalp and neck: Secondary | ICD-10-CM | POA: Diagnosis not present

## 2020-02-21 DIAGNOSIS — C44319 Basal cell carcinoma of skin of other parts of face: Secondary | ICD-10-CM | POA: Diagnosis not present

## 2020-02-21 DIAGNOSIS — C44219 Basal cell carcinoma of skin of left ear and external auricular canal: Secondary | ICD-10-CM | POA: Diagnosis not present

## 2020-02-21 DIAGNOSIS — D099 Carcinoma in situ, unspecified: Secondary | ICD-10-CM

## 2020-02-21 NOTE — Patient Instructions (Signed)

## 2020-02-22 ENCOUNTER — Ambulatory Visit: Payer: PPO | Admitting: Occupational Therapy

## 2020-02-22 ENCOUNTER — Encounter: Payer: Self-pay | Admitting: Occupational Therapy

## 2020-02-22 ENCOUNTER — Other Ambulatory Visit: Payer: Self-pay

## 2020-02-22 DIAGNOSIS — M6281 Muscle weakness (generalized): Secondary | ICD-10-CM | POA: Diagnosis not present

## 2020-02-22 DIAGNOSIS — R29898 Other symptoms and signs involving the musculoskeletal system: Secondary | ICD-10-CM

## 2020-02-22 DIAGNOSIS — M25511 Pain in right shoulder: Secondary | ICD-10-CM

## 2020-02-22 NOTE — Therapy (Signed)
[facility] [address] [place], [place], [address] Phone: [phone]   Fax:  [phone]  Occupational Therapy Treatment  Patient Details  Name: Martin Curry MRN: [id number] Date of Birth: 04-10-1932 Referring Provider (OT): Serena Colonel, MD   Encounter Date: 02/22/2020   OT End of Session - 02/22/20 0925    Visit Number 2    Number of Visits 9   Pt requested limiting visits due to insurance resetting in new year   Date for OT Re-Evaluation 05/01/20    Authorization Type [employer]    OT Start Time 0928    OT Stop Time 1016    OT Time Calculation (min) 48 min    Activity Tolerance Patient tolerated treatment well    Behavior During Therapy [facility] for tasks assessed/performed           Past Medical History:  Diagnosis Date  . Atypical nevus 09/07/2002   left abdomen-slight  . BCC (basal cell carcinoma of skin) 07/19/2014   left sideburn- +margin-exc.  [name] Kitchen BCC (basal cell carcinoma) 09/07/2002   right preauricular-exc., left crown of scalp-CX35FU  . BCC (basal cell carcinoma) 10/30/2003   right forehead-MOHS  . BCC (basal cell carcinoma) 01/07/2009   left scalp, left forearm  . BCC (basal cell carcinoma) 09/27/2014   left sideburn-free  . BCC (basal cell carcinoma) 07/16/2015   left scalp  . 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 History of kidney stones    Many years ago  . Hyperlipidemia   . Hypertension   . Hypothyroidism   . Nodular basal cell carcinoma (BCC) 11/21/2012   right side of scalp-CX35FU  . Nodular basal cell carcinoma (BCC) 06/12/2014   left sideburn-CX35FU/exc.  . Nodular basal cell carcinoma (BCC) 07/16/2015   left scalp-CX35FU  . Polycythemia   . Prostate cancer (HCC) 1990's   total prostatectomy and adjuvant radiation. Last PSA was 0.02 in 09/2011  . SCC (squamous cell carcinoma) 04/03/2009   forehead-txpbx  . SCC (squamous cell carcinoma) 07/16/2015   left front scalp  . SCC  (squamous cell carcinoma) 07/16/2015   left front scalp-Cx35FU  . SCCA (squamous cell carcinoma) of skin 12/20/2019   Right forehead (in situ)  . Superficial basal cell carcinoma (BCC) 11/21/2012   behind left ear-CX35FU, midback-CX35FU, right forehead-CX35FU-exc  . Superficial basal cell carcinoma (BCC) 11/02/2017   left post neck-CX35FU  . Superficial nodular basal cell carcinoma (BCC) 12/20/2019   Left temporal scalp    Past Surgical History:  Procedure Laterality Date  . ADJACENT TISSUE TRANSFER/TISSUE REARRANGEMENT N/A 05/22/2019   Procedure: COMPLEX CLOSURE OF NECK WOUND;  Surgeon: Allena Napoleon, MD;  Location: [facility] OR;  Service: Plastics;  Laterality: N/A;  . ALLOGRAFT APPLICATION N/A 05/22/2019   Procedure: POSSIBLE FACIAL NERVE RECONSTRUCTION WITH NERVE ALLOGRAFT VS AUTOGRAFT;  Surgeon: Allena Napoleon, MD;  Location: [facility] OR;  Service: Plastics;  Laterality: N/A;  . BLADDER SURGERY     due to prostatectomy.   [name] Kitchen CATARACT EXTRACTION     right  . COLONOSCOPY     neg in the past; due in 2014 with  Dr. Jarold Motto  . EYE SURGERY     50 years ago.   [name] Kitchen HERNIA REPAIR    . MOHS SURGERY    . NECK DISSECTION  05/22/2019    NECK DISSECTION  . PAROTIDECTOMY  05/22/2019   PAROTIDECTOMY with facial nerve dissection   . PAROTIDECTOMY Right 05/22/2019  Procedure: PAROTIDECTOMY;  Surgeon: Serena Colonel, MD;  Location[facility] OR;  Service: ENT;  Laterality: Right;  . PROSTATE SURGERY    . RADICAL NECK DISSECTION Right 05/22/2019   Procedure: RADICAL NECK DISSECTION;  Surgeon: Serena Colonel, MD;  Location: [facility] OR;  Service: ENT;  Laterality: Right;  . TONSILLECTOMY      There were no vitals filed for this visit.   Subjective Assessment - 02/22/20 0923    Subjective  Pt reports experiencing discomfort in L shoulder last night doing dishes. Describes sensation with certain right shoulder movements as "a feeling that's different than the left side"    Pertinent History sx removal of spinal  accessory n. secondary to parotidectomy    Patient Stated Goals "Is it a pipe dream to get back to normal?" I would like to build up my stamina.    Currently in Pain? No/denies            OT Treatments/Exercises (OP) - 02/22/20 0923      Shoulder Exercises: Therapy Ball   Other Therapy Ball Exercises closed chain shoulder flexion to facilitate increased ROM and proprioceptive feedback; rolling therapy ball up/down wall with forearms positioned on ball in neutral. Completed 2 sets/15 rep and reported no discomfort w/ exercise    Other Therapy Ball Exercises closed chain shoulder flexion; climbing hands up/down to slowly to roll therapy ball against wall. Completed 2 sets/15 rep and reported no pain in RUE. Min v/c required bring R hand up to same level as L hand      Shoulder Exercises: ROM/Strengthening   Other ROM/Strengthening Exercises AROM shoulder flexion and abduction within 45 degrees. Pt demonstrated compensatory movements of L lateral trunk flexion and slight trunk extension when moving from shoulder flexion to abduction and reported mild discomfort with abduction.   moist heat pack used to facilitate ease of movement and decrease stiffness of R shoulder   Other ROM/Strengthening Exercises OT facilitated mild stretching and PROM of R shoulder abduction. Mod verbal/tactile cues required to decrease compensatory tunk movement and maintain relaxation of RUE      Hand Exercises   Other Hand Exercises 2 sets/15 rep of full gross grasp of small frim ball to improve grip strength; pt completed activity w/ no difficulties      Neurological Re-education Exercises   Other Weight-Bearing Exercises 1 Reaching across body with L side to retrieve cones on ground while weight-bearing on R forearm and then coming upright to place cone on ipsilateral side of working arm; also completed reaching w/ R and weight-bearing on L. OT graded activity up to involve reaching for cones at and above shoulder  level.   Pt reported slight stretching feeling in R upper trap with both weight-bearing on R forearm and reaching across body to retrieve cones w/ R arm     Weight Bearing Technique   Weight Bearing Technique --            OT Education - 02/22/20 0924    Education Details Education provided on plan/goals for OT and purpose of different types of movement completed during session    Person(s) Educated Patient    Methods Explanation;Demonstration    Comprehension Verbalized understanding;Need further instruction            OT Short Term Goals - 02/22/20 1153      OT SHORT TERM GOAL #1   Title Pt will improve functional reach during light housekeeping tasks by increasing AROM of R shoulder flexion by at  least 10 degrees    Baseline R shoulder flexion 100 degrees    Time 4    Period Weeks    Status On-going    Target Date 03/22/20      OT SHORT TERM GOAL #2   Title Pt will improve sustained grasp of medium-weight bags by increasing grip strength of dominant R hand by at least 10 lbs.    Baseline R hand (dominant) - 54 lbs; L hand - 65 lbs    Time 4    Period Weeks    Status On-going      OT SHORT TERM GOAL #3   Title Pt will improve functional RUE use during ADLs by improving strength of R shoulder abduction to at least 3-/5    Baseline Pt unable to move > half ROM against gravity (2+/5)    Time 4    Period Weeks    Status On-going             OT Long Term Goals - 02/22/20 1153      OT LONG TERM GOAL #1   Title Pt will be independent with home carryover of RUE HEP    Time 8    Period Weeks    Status On-going      OT LONG TERM GOAL #2   Title Pt will retrieve lightweight items at or above chest height using RUE with pain less than 4/10 greater than 75% of the time    Baseline Pt reports being unable to lift objects at/above shoulder height with RUE    Time 8    Period Weeks    Status On-going      OT LONG TERM GOAL #3   Title Pt will improve functional UE use  during leisure and caregiving activities by improving AROM of bilateral shoulder abduction by at least 10 degrees    Baseline R shoulder abduction - 72 degrees; L shoulder abduction 100 degrees    Time 8    Period Weeks    Status On-going      OT LONG TERM GOAL #4   Title Pt will independently identify at least 5 adaptive/compensatory strategies and/or pain management techniques to improve safety and facilitate pain relief during ADLs    Time 8    Period Weeks    Status On-going      OT LONG TERM GOAL #5   Title Pt will tolerate at least 10 min of physical activity within pt level of satisfaction to improve generalized endurance for participation in lawn care activities   pt stated goal   Time 8    Period Weeks    Status On-going             Plan - 02/22/20 0925    Clinical Impression Statement Pt tolerated therapeutic activities well and was able to complete RUE shoulder ROM WFL during closed chain exercises. Weakness/tightness with R shoulder abduction, as well as decreased ability to move through shoulder ROM against any additional resistance, continue to be limiting for functional UE use.    OT Occupational Profile and History Detailed Assessment- Review of Records and additional review of physical, cognitive, psychosocial history related to current functional performance    Occupational performance deficits (Please refer to evaluation for details): ADL's;IADL's;Leisure    Body Structure / Function / Physical Skills ADL;Decreased knowledge of use of DME;Strength;GMC;Pain;UE functional use;Body mechanics;Endurance;IADL;ROM    Psychosocial Skills Environmental  Adaptations    Rehab Potential Good    Clinical Decision  Making Several treatment options, min-mod task modification necessary    Comorbidities Affecting Occupational Performance: May have comorbidities impacting occupational performance    Modification or Assistance to Complete Evaluation  No modification of tasks or assist  necessary to complete eval    OT Frequency 1x / week    OT Duration 8 weeks    OT Treatment/Interventions Self-care/ADL training;Moist Heat;DME and/or AE instruction;Contrast Bath;Compression bandaging;Therapeutic activities;Ultrasound;Therapeutic exercise;Cryotherapy;Iontophoresis;Electrical Stimulation;Paraffin;Energy conservation;Manual Therapy;Patient/family education;Passive range of motion    Plan continue to challenge shoulder ROM; provide education on poc; initiate HEP    Consulted and Agree with Plan of Care Patient           Patient will benefit from skilled therapeutic intervention in order to improve the following deficits and impairments:   Body Structure / Function / Physical Skills: ADL,Decreased knowledge of use of DME,Strength,GMC,Pain,UE functional use,Body mechanics,Endurance,IADL,ROM   Psychosocial Skills: Environmental  Adaptations   Visit Diagnosis: Muscle weakness (generalized)  Right shoulder pain, unspecified chronicity  Other symptoms and signs involving the musculoskeletal system    Problem List Patient Active Problem List   Diagnosis Date Noted  . 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 Basal cell carcinoma (BCC) of skin of neck 05/03/2019  . Polycythemia     Kathrine Cords, OTR/L, MSOT 02/22/2020, 1:01 PM  [facility]. [place], [place], [address] Phone: [phone]   Fax:  [phone]  Name: Martin Curry MRN: [id number] Date of Birth: [birth year]

## 2020-02-26 ENCOUNTER — Ambulatory Visit: Payer: PPO | Admitting: Occupational Therapy

## 2020-02-27 ENCOUNTER — Telehealth: Payer: Self-pay

## 2020-02-27 NOTE — Telephone Encounter (Signed)
-----   Message from Janalyn Harder, MD sent at 02/27/2020  4:40 AM EST ----- Please inform [name] spot removed in front of his ear also showed a nonmole skin cancer.  If it recurs after the biopsy, Dr. Karie Schwalbe may refer for Mohs surgery.  He should already have a follow-up scheduled.

## 2020-02-27 NOTE — Telephone Encounter (Signed)
Phone call to patient with his pathology results and Dr. Sherryl Barters recommendation.  Patient aware.

## 2020-02-27 NOTE — Progress Notes (Signed)
Follow-Up Visit   Subjective  Martin Curry is a 87 y.o. male who presents for the following: Procedure (BCC left temporal scalp /CIS right forehead).  Biopsy proven carcinomas face Location:  Duration:  Quality:  Associated Signs/Symptoms: Modifying Factors:  Severity:  Timing: Context: One additional crust in front of left ear Objective  Well appearing patient in no apparent distress; mood and affect are within normal limits. Objective  Left Preauricular Area: Pearly 1.2 cm crust, not contiguous with previous biopsy.       Objective  Right Forehead: Lesion identified by Dr.Sahmir Weatherbee and nurse in room.    Objective  Left Temporal Scalp: Lesion identified by Dr.Regan Llorente and nurse in room.     A focused examination was performed including Head and neck.. Relevant physical exam findings are noted in the Assessment and Plan.   Assessment & Plan    Basal cell carcinoma (BCC) of skin of other part of face Left Preauricular Area  Destruction of lesion Complexity: simple   Destruction method: electrodesiccation and curettage   Informed consent: discussed and consent obtained   Timeout:  patient name, date of birth, surgical site, and procedure verified Anesthesia: the lesion was anesthetized in a standard fashion   Anesthetic:  1% lidocaine w/ epinephrine 1-100,000 local infiltration Curettage performed in three different directions: Yes     Electrodesiccation performed over the curetted area: No   Curettage cycles:  3 Lesion length (cm):  1.3 Lesion width (cm):  1.3 Margin per side (cm):  0 Final wound size (cm):  1.3 Hemostasis achieved with:  ferric subsulfate Outcome: patient tolerated procedure well with no complications   Post-procedure details: sterile dressing applied and wound care instructions given   Dressing type: bandage and petrolatum   Additional details:  Wound innoculated with 5 fluorouracil solution.  Specimen 1 - Surgical  pathology Differential Diagnosis: R/O BCC vs SCC  Check Margins: No  Treated after biopsy  After shave biopsy done the base was treated by curettage and cautery.  Squamous cell carcinoma in situ Right Forehead  Destruction of lesion Complexity: simple   Destruction method: electrodesiccation and curettage   Informed consent: discussed and consent obtained   Timeout:  patient name, date of birth, surgical site, and procedure verified Anesthesia: the lesion was anesthetized in a standard fashion   Anesthetic:  1% lidocaine w/ epinephrine 1-100,000 local infiltration Curettage performed in three different directions: Yes   Curettage cycles:  2 Lesion length (cm):  0.5 Lesion width (cm):  0.5 Margin per side (cm):  0 Final wound size (cm):  0.5 Hemostasis achieved with:  ferric subsulfate Outcome: patient tolerated procedure well with no complications   Additional details:  Wound innoculated with 5 fluorouracil solution.  Basal cell carcinoma (BCC) of scalp Left Temporal Scalp  Destruction of lesion Complexity: simple   Destruction method: electrodesiccation and curettage   Informed consent: discussed and consent obtained   Timeout:  patient name, date of birth, surgical site, and procedure verified Anesthesia: the lesion was anesthetized in a standard fashion   Anesthetic:  1% lidocaine w/ epinephrine 1-100,000 local infiltration Curettage performed in three different directions: Yes     Electrodesiccation performed over the curetted area: No   Curettage cycles:  3 Lesion length (cm):  1 Lesion width (cm):  1 Margin per side (cm):  0 Final wound size (cm):  1 Hemostasis achieved with:  ferric subsulfate Outcome: patient tolerated procedure well with no complications   Post-procedure details: sterile dressing applied  and wound care instructions given   Dressing type: bandage and petrolatum   Additional details:  Wound innoculated with 5 fluorouracil solution.     I,  Janalyn Harder, MD, have reviewed all documentation for this visit.  The documentation on 02/27/20 for the exam, diagnosis, procedures, and orders are all accurate and complete.

## 2020-02-29 ENCOUNTER — Ambulatory Visit: Payer: PPO | Attending: Otolaryngology | Admitting: Occupational Therapy

## 2020-02-29 ENCOUNTER — Encounter: Payer: Self-pay | Admitting: Occupational Therapy

## 2020-02-29 ENCOUNTER — Other Ambulatory Visit: Payer: Self-pay

## 2020-02-29 DIAGNOSIS — M6281 Muscle weakness (generalized): Secondary | ICD-10-CM | POA: Diagnosis not present

## 2020-02-29 DIAGNOSIS — M25511 Pain in right shoulder: Secondary | ICD-10-CM | POA: Insufficient documentation

## 2020-02-29 DIAGNOSIS — R29898 Other symptoms and signs involving the musculoskeletal system: Secondary | ICD-10-CM | POA: Insufficient documentation

## 2020-02-29 NOTE — Patient Instructions (Signed)
Access Code: [email or web address] URL: [email or web address] Date: 02/29/2020 Prepared by: Rosie Fate, OTR/L  Exercises: - Standing Shoulder Flexion Full Range - 2-3x/daily - 1 set/25 reps - Shoulder Abduction - Palms Down - 2-3x/daily - 1 set/25 reps - Seated Shoulder Shrugs - 2-3 x daily - 1 set/25 reps - Doorway Stretch - 1-2 x daily - 1 set/15 reps  OR - "Butterfly Stretch" (Seated Scapular Retraction w/ arms behind head) - 1-2 x daily - 1 sets/15 reps  *Pt instructed to complete either the doorway stretch or the butterfly stretch per day for scapular retraction

## 2020-02-29 NOTE — Therapy (Signed)
[facility] [address] [place], [place], [id number] Phone: [phone]   Fax:  [phone]  Occupational Therapy Treatment  Patient Details  Name: Martin Curry MRN: [id number] Date of Birth: 01/31/1933 Referring Provider (OT): Serena ColonelJefry Rosen, MD   Encounter Date: 02/29/2020   OT End of Session - 02/29/20 1151    Visit Number 3    Number of Visits 9   Pt requested limiting visits due to insurance resetting in new year   Date for OT Re-Evaluation 05/01/20    Authorization Type [employer]    OT Start Time 1018    OT Stop Time 1103    OT Time Calculation (min) 45 min    Activity Tolerance Patient tolerated treatment well    Behavior During Therapy [facility] for tasks assessed/performed           Past Medical History:  Diagnosis Date  . Atypical nevus 09/07/2002   left abdomen-slight  . BCC (basal cell carcinoma of skin) 07/19/2014   left sideburn- +margin-exc.  [name] Kitchen. BCC (basal cell carcinoma) 09/07/2002   right preauricular-exc., left crown of scalp-CX35FU  . BCC (basal cell carcinoma) 10/30/2003   right forehead-MOHS  . BCC (basal cell carcinoma) 01/07/2009   left scalp, left forearm  . BCC (basal cell carcinoma) 09/27/2014   left sideburn-free  . BCC (basal cell carcinoma) 07/16/2015   left scalp  . 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 History of kidney stones    Many years ago  . Hyperlipidemia   . Hypertension   . Hypothyroidism   . Nodular basal cell carcinoma (BCC) 11/21/2012   right side of scalp-CX35FU  . Nodular basal cell carcinoma (BCC) 06/12/2014   left sideburn-CX35FU/exc.  . Nodular basal cell carcinoma (BCC) 07/16/2015   left scalp-CX35FU  . Polycythemia   . Prostate cancer (HCC) 1990's   total prostatectomy and adjuvant radiation. Last PSA was 0.02 in 09/2011  . SCC (squamous cell carcinoma) 04/03/2009   forehead-txpbx  . SCC (squamous cell carcinoma) 07/16/2015   left front scalp  . SCC  (squamous cell carcinoma) 07/16/2015   left front scalp-Cx35FU  . SCCA (squamous cell carcinoma) of skin 12/20/2019   Right forehead (in situ)  . Superficial basal cell carcinoma (BCC) 11/21/2012   behind left ear-CX35FU, midback-CX35FU, right forehead-CX35FU-exc  . Superficial basal cell carcinoma (BCC) 11/02/2017   left post neck-CX35FU  . Superficial nodular basal cell carcinoma (BCC) 12/20/2019   Left temporal scalp    Past Surgical History:  Procedure Laterality Date  . ADJACENT TISSUE TRANSFER/TISSUE REARRANGEMENT N/A 05/22/2019   Procedure: COMPLEX CLOSURE OF NECK WOUND;  Surgeon: Allena NapoleonPace, Collier S, MD;  Location: [facility] OR;  Service: Plastics;  Laterality: N/A;  . ALLOGRAFT APPLICATION N/A 05/22/2019   Procedure: POSSIBLE FACIAL NERVE RECONSTRUCTION WITH NERVE ALLOGRAFT VS AUTOGRAFT;  Surgeon: Allena NapoleonPace, Collier S, MD;  Location: [facility] OR;  Service: Plastics;  Laterality: N/A;  . BLADDER SURGERY     due to prostatectomy.   [name] Kitchen. CATARACT EXTRACTION     right  . COLONOSCOPY     neg in the past; due in 2014 with [place] Dr. Jarold Motto[place]  . EYE SURGERY     50 years ago.   [name] Kitchen. HERNIA REPAIR    . MOHS SURGERY    . NECK DISSECTION  05/22/2019    NECK DISSECTION  . PAROTIDECTOMY  05/22/2019   PAROTIDECTOMY with facial nerve dissection   . PAROTIDECTOMY Right 05/22/2019  Procedure: PAROTIDECTOMY;  Surgeon: Izora Gala, MD;  Location: [place];  Service: ENT;  Laterality: Right;  . PROSTATE SURGERY    . RADICAL NECK DISSECTION Right 05/22/2019   Procedure: RADICAL NECK DISSECTION;  Surgeon: Izora Gala, MD;  Location: [place];  Service: ENT;  Laterality: Right;  . TONSILLECTOMY      There were no vitals filed for this visit.   Subjective Assessment - 02/29/20 1019    Subjective  Pt reports he has been completing shoulder flexion, abduction, and shrug exercises frequently at home    Pertinent History sx removal of spinal accessory n. secondary to parotidectomy    Patient Stated Goals "Is it a pipe  dream to get back to normal?" I would like to build up my stamina.    Currently in Pain? No/denies             OT Treatments/Exercises (OP) - 02/29/20 1426      Shoulder Exercises: Supine   Flexion Strengthening;15 reps   closed chain BUE flexion w/ 1lb dowel bar; pt reported no discomfort with activity and was able to move through full ROM without difficulty   Other Supine Exercises Closed chain AROM of alternating BUE composite shoulder flexion and internal rotation with elbow extended using PVC square; min v/c and tactile cues to return to 90 degrees of shoulder flexion between rotations      Shoulder Exercises: Standing   ABduction Strengthening;Right   completed within range of 0-45 degrees to aid in maintenance of correct movement patterns; significant prominence of R superomedial scapular border noted and OT attempted to facilitate blocking during exercise; pt reported discomfort and activity was d/c   Theraband Level (Shoulder ABduction) Level 2 (Red)      Shoulder Exercises: Stretch   Other Shoulder Stretches doorway pec stretch completed to assist with facilitation of scapular retration and shoulder girdle stability   pt instructed to include scapular retraction exercises/stretching into HEP; handout administered     Neurological Re-education Exercises   Scapular Stabilization Bilateral;15 reps   scapular retraction attempted in prone with OT demonstration and tactile cues; pt unable to isolate scapular movements and exercise was altered; pt successful with scapular retraction completed seated edge of mat with BUE externally rotated behind head            OT Education - 02/29/20 1327    Education Details Continuing education provided on purpose of different exercises completed during session; OT reviewed exercises to be completed at home and HEP handout was administered    Person(s) Educated Patient    Methods Explanation;Demonstration;Handout    Comprehension Verbalized  understanding;Need further instruction;Returned demonstration            OT Short Term Goals - 02/22/20 1153      OT SHORT TERM GOAL #1   Title Pt will improve functional reach during light housekeeping tasks by increasing AROM of R shoulder flexion by at least 10 degrees    Baseline R shoulder flexion 100 degrees    Time 4    Period Weeks    Status On-going    Target Date 03/22/20      OT SHORT TERM GOAL #2   Title Pt will improve sustained grasp of medium-weight bags by increasing grip strength of dominant R hand by at least 10 lbs.    Baseline R hand (dominant) - 54 lbs; L hand - 65 lbs    Time 4    Period Weeks    Status On-going  OT SHORT TERM GOAL #3   Title Pt will improve functional RUE use during ADLs by improving strength of R shoulder abduction to at least 3-/5    Baseline Pt unable to move > half ROM against gravity (2+/5)    Time 4    Period Weeks    Status On-going             OT Long Term Goals - 02/22/20 1153      OT LONG TERM GOAL #1   Title Pt will be independent with home carryover of RUE HEP    Time 8    Period Weeks    Status On-going      OT LONG TERM GOAL #2   Title Pt will retrieve lightweight items at or above chest height using RUE with pain less than 4/10 greater than 75% of the time    Baseline Pt reports being unable to lift objects at/above shoulder height with RUE    Time 8    Period Weeks    Status On-going      OT LONG TERM GOAL #3   Title Pt will improve functional UE use during leisure and caregiving activities by improving AROM of bilateral shoulder abduction by at least 10 degrees    Baseline R shoulder abduction - 72 degrees; L shoulder abduction 100 degrees    Time 8    Period Weeks    Status On-going      OT LONG TERM GOAL #4   Title Pt will independently identify at least 5 adaptive/compensatory strategies and/or pain management techniques to improve safety and facilitate pain relief during ADLs    Time 8     Period Weeks    Status On-going      OT LONG TERM GOAL #5   Title Pt will tolerate at least 10 min of physical activity within pt level of satisfaction to improve generalized endurance for participation in lawn care activities   pt stated goal   Time 8    Period Weeks    Status On-going             Plan - 02/29/20 1333    Clinical Impression Statement Pt tolerated therapeutic activities well and was able to complete RUE shoulder ROM WFL during closed chain exercises. Increased prominence of R superomedial scapular border was notable with slight scapular winging during shoulder abduction against light resistance; pt reported mild discomfort, but no pain. Pt experienced difficulty isolating shoulder girdle during scapular exercises and weakness/tightness continue to impact functional RUE use. Appropriate ROM, stretching, and strengthening of shoulder girdle will continue to be addressed.    OT Occupational Profile and History Detailed Assessment- Review of Records and additional review of physical, cognitive, psychosocial history related to current functional performance    Occupational performance deficits (Please refer to evaluation for details): ADL's;IADL's;Leisure    Body Structure / Function / Physical Skills ADL;Decreased knowledge of use of DME;Strength;GMC;Pain;UE functional use;Body mechanics;Endurance;IADL;ROM    Psychosocial Skills Environmental  Adaptations    Rehab Potential Good    Clinical Decision Making Several treatment options, min-mod task modification necessary    Comorbidities Affecting Occupational Performance: May have comorbidities impacting occupational performance    Modification or Assistance to Complete Evaluation  No modification of tasks or assist necessary to complete eval    OT Frequency 1x / week    OT Duration 8 weeks    OT Treatment/Interventions Self-care/ADL training;Moist Heat;DME and/or AE instruction;Contrast Bath;Compression bandaging;Therapeutic  activities;Ultrasound;Therapeutic exercise;Cryotherapy;Iontophoresis;Electrical Stimulation;Paraffin;Energy  conservation;Manual Therapy;Patient/family education;Passive range of motion    Plan gravity eliminated/minimized shoulder PROM; strengthening    Consulted and Agree with Plan of Care Patient           Patient will benefit from skilled therapeutic intervention in order to improve the following deficits and impairments:   Body Structure / Function / Physical Skills: ADL,Decreased knowledge of use of DME,Strength,GMC,Pain,UE functional use,Body mechanics,Endurance,IADL,ROM   Psychosocial Skills: Environmental  Adaptations   Visit Diagnosis: Muscle weakness (generalized)  Right shoulder pain, unspecified chronicity  Other symptoms and signs involving the musculoskeletal system    Problem List Patient Active Problem List   Diagnosis Date Noted  . 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 Basal cell carcinoma (BCC) of skin of neck 05/03/2019  . Polycythemia     Rosie Fate, OTR/L, MSOT 02/29/2020, 3:14 PM  [facility] [address] [place], [place], [address] Phone: [phone]   Fax:  [phone]  Name: Martin Curry MRN: [id number] Date of Birth: 1932-08-16

## 2020-03-07 ENCOUNTER — Encounter: Payer: Self-pay | Admitting: Occupational Therapy

## 2020-03-07 ENCOUNTER — Ambulatory Visit: Payer: PPO | Admitting: Occupational Therapy

## 2020-03-07 ENCOUNTER — Other Ambulatory Visit: Payer: Self-pay

## 2020-03-07 DIAGNOSIS — M25511 Pain in right shoulder: Secondary | ICD-10-CM

## 2020-03-07 DIAGNOSIS — M6281 Muscle weakness (generalized): Secondary | ICD-10-CM | POA: Diagnosis not present

## 2020-03-07 DIAGNOSIS — R29898 Other symptoms and signs involving the musculoskeletal system: Secondary | ICD-10-CM

## 2020-03-07 NOTE — Therapy (Signed)
[facility] [address] [place], [place], [address] Phone: [phone]   Fax:  [phone]  Occupational Therapy Treatment  Patient Details  Name: Martin Curry MRN: [id number] Date of Birth: 1932-08-28 Referring Provider (OT): Serena Colonel, MD   Encounter Date: 03/07/2020   OT End of Session - 03/07/20 0937    Visit Number 4    Number of Visits 9   Pt requested limiting visits due to insurance resetting in new year   Date for OT Re-Evaluation 05/01/20    Authorization Type [employer]    OT Start Time 0933    OT Stop Time 1015    OT Time Calculation (min) 42 min    Activity Tolerance Patient tolerated treatment well    Behavior During Therapy [facility] for tasks assessed/performed           Past Medical History:  Diagnosis Date  . Atypical nevus 09/07/2002   left abdomen-slight  . BCC (basal cell carcinoma of skin) 07/19/2014   left sideburn- +margin-exc.  [name] Kitchen BCC (basal cell carcinoma) 09/07/2002   right preauricular-exc., left crown of scalp-CX35FU  . BCC (basal cell carcinoma) 10/30/2003   right forehead-MOHS  . BCC (basal cell carcinoma) 01/07/2009   left scalp, left forearm  . BCC (basal cell carcinoma) 09/27/2014   left sideburn-free  . BCC (basal cell carcinoma) 07/16/2015   left scalp  . 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 History of kidney stones    Many years ago  . Hyperlipidemia   . Hypertension   . Hypothyroidism   . Nodular basal cell carcinoma (BCC) 11/21/2012   right side of scalp-CX35FU  . Nodular basal cell carcinoma (BCC) 06/12/2014   left sideburn-CX35FU/exc.  . Nodular basal cell carcinoma (BCC) 07/16/2015   left scalp-CX35FU  . Polycythemia   . Prostate cancer (HCC) 1990's   total prostatectomy and adjuvant radiation. Last PSA was 0.02 in 09/2011  . SCC (squamous cell carcinoma) 04/03/2009   forehead-txpbx  . SCC (squamous cell carcinoma) 07/16/2015   left front scalp  . SCC  (squamous cell carcinoma) 07/16/2015   left front scalp-Cx35FU  . SCCA (squamous cell carcinoma) of skin 12/20/2019   Right forehead (in situ)  . Superficial basal cell carcinoma (BCC) 11/21/2012   behind left ear-CX35FU, midback-CX35FU, right forehead-CX35FU-exc  . Superficial basal cell carcinoma (BCC) 11/02/2017   left post neck-CX35FU  . Superficial nodular basal cell carcinoma (BCC) 12/20/2019   Left temporal scalp    Past Surgical History:  Procedure Laterality Date  . ADJACENT TISSUE TRANSFER/TISSUE REARRANGEMENT N/A 05/22/2019   Procedure: COMPLEX CLOSURE OF NECK WOUND;  Surgeon: Allena Napoleon, MD;  [facility];  Service: Plastics;  Laterality: N/A;  . ALLOGRAFT APPLICATION N/A 05/22/2019   Procedure: POSSIBLE FACIAL NERVE RECONSTRUCTION WITH NERVE ALLOGRAFT VS AUTOGRAFT;  Surgeon: Allena Napoleon, MD;  [facility]: [facility];  Service: Plastics;  Laterality: N/A;  . BLADDER SURGERY     due to prostatectomy.   [name] Kitchen CATARACT EXTRACTION     right  . COLONOSCOPY     neg in the past; due in 2014 with [place] Dr. Jarold Motto  . EYE SURGERY     50 years ago.   [name] Kitchen HERNIA REPAIR    . MOHS SURGERY    . NECK DISSECTION  05/22/2019    NECK DISSECTION  . PAROTIDECTOMY  05/22/2019   PAROTIDECTOMY with facial nerve dissection   . PAROTIDECTOMY Right 05/22/2019  Procedure: PAROTIDECTOMY;  Surgeon: Serena Colonel, MD;  Location: [facility] OR;  Service: ENT;  Laterality: Right;  . PROSTATE SURGERY    . RADICAL NECK DISSECTION Right 05/22/2019   Procedure: RADICAL NECK DISSECTION;  Surgeon: Serena Colonel, MD;  Location: [facility] OR;  Service: ENT;  Laterality: Right;  . TONSILLECTOMY      There were no vitals filed for this visit.   Subjective Assessment - 03/07/20 0940    Subjective  "Is it possible for [place] to be seeing improvements in my shoulder already?"    Pertinent History sx removal of spinal accessory n. secondary to parotidectomy    Patient Stated Goals "Is it a pipe dream to get back to normal?" I  would like to build up my stamina.    Currently in Pain? No/denies            OT Treatments/Exercises (OP) - 03/07/20 1703      Shoulder Exercises: Supine   ABduction AAROM;15 reps;Right   1. Completed slowly in gravity-eliminated position with OT providing support under RUE (15x1) and 2. while holding cane w/out therapist support, moving arm out to side with elbow straight, assisting motion with contralateral arm (15x; alternating UEs)     Shoulder Exercises: Seated   Abduction AROM;Both;5 reps   Exercise completed sitting EoM with BUEs; Pt unable to abduct RUE past ~60% without compensating with lateral flexion of trunk to L     Manual Therapy   Passive ROM PROM; 10x; R shoulder abduction   OT facilitated slow, gentle PROM, holding position at end range to promote stretch. Therapist was able to increase shoulder abduction by at least 10 degrees within tolerable level of discomfort           OT Education - 03/07/20 1645    Education Details Pt reviewed current HEP exercises with OT to ensure he was completing them correctly. Education also provided on reason and benefit of completing exercises in supine and in gravity-eliminated position.    Person(s) Educated Patient    Methods Explanation    Comprehension Verbalized understanding            OT Short Term Goals - 02/22/20 1153      OT SHORT TERM GOAL #1   Title Pt will improve functional reach during light housekeeping tasks by increasing AROM of R shoulder flexion by at least 10 degrees    Baseline R shoulder flexion 100 degrees    Time 4    Period Weeks    Status On-going    Target Date 03/22/20      OT SHORT TERM GOAL #2   Title Pt will improve sustained grasp of medium-weight bags by increasing grip strength of dominant R hand by at least 10 lbs.    Baseline R hand (dominant) - 54 lbs; L hand - 65 lbs    Time 4    Period Weeks    Status On-going      OT SHORT TERM GOAL #3   Title Pt will improve functional RUE  use during ADLs by improving strength of R shoulder abduction to at least 3-/5    Baseline Pt unable to move > half ROM against gravity (2+/5)    Time 4    Period Weeks    Status On-going             OT Long Term Goals - 03/07/20 1744      OT LONG TERM GOAL #1   Title Pt will be  independent with home carryover of RUE HEP    Time 8    Period Weeks    Status On-going   Pt created HEP tracking schedule, demonstrating good carryover at home     OT LONG TERM GOAL #2   Title Pt will retrieve lightweight items at or above chest height using RUE with pain less than 4/10 greater than 75% of the time    Baseline Pt reports being unable to lift objects at/above shoulder height with RUE    Time 8    Period Weeks    Status On-going      OT LONG TERM GOAL #3   Title Pt will improve functional UE use during leisure and caregiving activities by improving AROM of bilateral shoulder abduction by at least 10 degrees    Baseline R shoulder abduction - 72 degrees; L shoulder abduction 100 degrees    Time 8    Period Weeks    Status On-going      OT LONG TERM GOAL #4   Title Pt will independently identify at least 5 adaptive/compensatory strategies and/or pain management techniques to improve safety and facilitate pain relief during ADLs    Time 8    Period Weeks    Status On-going      OT LONG TERM GOAL #5   Title Pt will tolerate at least 10 min of physical activity within pt level of satisfaction to improve generalized endurance for participation in lawn care activities   pt stated goal   Time 8    Period Weeks    Status On-going            Plan - 03/07/20 1735    Clinical Impression Statement Pt reports shoulder abduction exercises continue to be the most difficult at home, but that other exercises are going well; PROM/AAROM shoulder abduction were focused on this session for strengthening and increasing flexibility. Exercises were performed in supine to provide support for scapula  and facilitate correct movement pattern. Pt tolerated exercises well and was able to improve ROM in gravity eliminated position by at least ~20 degrees within tolerable level of pain. Decreased AROM of shoulder abduction demo'd while in seated position likely impacted by gravity.    OT Occupational Profile and History Detailed Assessment- Review of Records and additional review of physical, cognitive, psychosocial history related to current functional performance    Occupational performance deficits (Please refer to evaluation for details): ADL's;IADL's;Leisure    Body Structure / Function / Physical Skills ADL;Decreased knowledge of use of DME;Strength;GMC;Pain;UE functional use;Body mechanics;Endurance;IADL;ROM    Psychosocial Skills Environmental  Adaptations    Rehab Potential Good    Clinical Decision Making Several treatment options, min-mod task modification necessary    Comorbidities Affecting Occupational Performance: May have comorbidities impacting occupational performance    Modification or Assistance to Complete Evaluation  No modification of tasks or assist necessary to complete eval    OT Frequency 1x / week    OT Duration 8 weeks    OT Treatment/Interventions Self-care/ADL training;Moist Heat;DME and/or AE instruction;Contrast Bath;Compression bandaging;Therapeutic activities;Ultrasound;Therapeutic exercise;Cryotherapy;Iontophoresis;Electrical Stimulation;Paraffin;Energy conservation;Manual Therapy;Patient/family education;Passive range of motion    Plan continue poc; shoulder and scapula strengthening    Consulted and Agree with Plan of Care Patient           Patient will benefit from skilled therapeutic intervention in order to improve the following deficits and impairments:   Body Structure / Function / Physical Skills: ADL,Decreased knowledge of use of DME,Strength,GMC,Pain,UE functional use,Body  mechanics,Endurance,IADL,ROM   Psychosocial Skills: Environmental   Adaptations   Visit Diagnosis: Muscle weakness (generalized)  Right shoulder pain, unspecified chronicity  Other symptoms and signs involving the musculoskeletal system    Problem List Patient Active Problem List   Diagnosis Date Noted  . Cancer of parotid gland ([place]) 05/22/2019  . Basal cell carcinoma (BCC) of skin of neck 05/03/2019  . Polycythemia      Kathrine Cords, OTR/L, MSOT 03/07/2020, 5:47 PM  [place]. [place], [place], [address] Phone: [phone]   Fax:  [phone]  Name: Martin Curry MRN: [id number] Date of Birth: June 11, 1932

## 2020-03-14 ENCOUNTER — Encounter: Payer: Self-pay | Admitting: Occupational Therapy

## 2020-03-14 ENCOUNTER — Ambulatory Visit: Payer: PPO | Admitting: Occupational Therapy

## 2020-03-14 ENCOUNTER — Other Ambulatory Visit: Payer: Self-pay

## 2020-03-14 DIAGNOSIS — M6281 Muscle weakness (generalized): Secondary | ICD-10-CM

## 2020-03-14 DIAGNOSIS — R29898 Other symptoms and signs involving the musculoskeletal system: Secondary | ICD-10-CM

## 2020-03-14 DIAGNOSIS — M25511 Pain in right shoulder: Secondary | ICD-10-CM

## 2020-03-14 NOTE — Therapy (Signed)
[place] [place], [place], [address] Phone: [phone]   Fax:  [phone]  Occupational Therapy Treatment  Patient Details  Name: Martin Curry MRN: [id number] Date of Birth: November 04, 1932 Referring Provider (OT): Izora Gala, MD   Encounter Date: 03/14/2020   OT End of Session - 03/14/20 1605    Visit Number 5    Number of Visits 9   Pt requested limiting visits due to insurance resetting in new year   Date for OT Re-Evaluation 05/01/20    Authorization Type [employer]    OT Start Time 1017    OT Stop Time 1102    OT Time Calculation (min) 45 min    Activity Tolerance Patient tolerated treatment well    Behavior During Therapy [facility] for tasks assessed/performed           Past Medical History:  Diagnosis Date  . Atypical nevus 09/07/2002   left abdomen-slight  . BCC (basal cell carcinoma of skin) 07/19/2014   left sideburn- +margin-exc.  [name] Kitchen BCC (basal cell carcinoma) 09/07/2002   right preauricular-exc., left crown of scalp-CX35FU  . BCC (basal cell carcinoma) 10/30/2003   right forehead-MOHS  . BCC (basal cell carcinoma) 01/07/2009   left scalp, left forearm  . BCC (basal cell carcinoma) 09/27/2014   left sideburn-free  . BCC (basal cell carcinoma) 07/16/2015   left scalp  . 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 History of kidney stones    Many years ago  . Hyperlipidemia   . Hypertension   . Hypothyroidism   . Nodular basal cell carcinoma (BCC) 11/21/2012   right side of scalp-CX35FU  . Nodular basal cell carcinoma (BCC) 06/12/2014   left sideburn-CX35FU/exc.  . Nodular basal cell carcinoma (BCC) 07/16/2015   left scalp-CX35FU  . Polycythemia   . Prostate cancer ([place]) 1990's   total prostatectomy and adjuvant radiation. Last PSA was 0.02 in 09/2011  . SCC (squamous cell carcinoma) 04/03/2009   forehead-txpbx  . SCC (squamous cell carcinoma) 07/16/2015   left front scalp  . SCC  (squamous cell carcinoma) 07/16/2015   left front scalp-Cx35FU  . SCCA (squamous cell carcinoma) of skin 12/20/2019   Right forehead (in situ)  . Superficial basal cell carcinoma (BCC) 11/21/2012   behind left ear-CX35FU, midback-CX35FU, right forehead-CX35FU-exc  . Superficial basal cell carcinoma (BCC) 11/02/2017   left post neck-CX35FU  . Superficial nodular basal cell carcinoma (BCC) 12/20/2019   Left temporal scalp    Past Surgical History:  Procedure Laterality Date  . ADJACENT TISSUE TRANSFER/TISSUE REARRANGEMENT N/A 05/22/2019   Procedure: COMPLEX CLOSURE OF NECK WOUND;  Surgeon: Cindra Presume, MD;  Location: [place];  Service: Plastics;  Laterality: N/A;  . ALLOGRAFT APPLICATION N/A 10/11/2991   Procedure: POSSIBLE FACIAL NERVE RECONSTRUCTION WITH NERVE ALLOGRAFT VS AUTOGRAFT;  Surgeon: Cindra Presume, MD;  Location: [place];  Service: Plastics;  Laterality: N/A;  . BLADDER SURGERY     due to prostatectomy.   [name] Kitchen CATARACT EXTRACTION     right  . COLONOSCOPY     neg in the past; due in 2014 with [place] Dr. Sharlett Iles  . EYE SURGERY     50 years ago.   [name] Kitchen HERNIA REPAIR    . MOHS SURGERY    . NECK DISSECTION  05/22/2019    NECK DISSECTION  . PAROTIDECTOMY  05/22/2019   PAROTIDECTOMY with facial nerve dissection   . PAROTIDECTOMY Right 05/22/2019  Procedure: PAROTIDECTOMY;  Surgeon: Izora Gala, MD;  Location: [facility];  Service: ENT;  Laterality: Right;  . PROSTATE SURGERY    . RADICAL NECK DISSECTION Right 05/22/2019   Procedure: RADICAL NECK DISSECTION;  Surgeon: Izora Gala, MD;  Location: [facility];  Service: ENT;  Laterality: Right;  . TONSILLECTOMY      There were no vitals filed for this visit.   Subjective Assessment - 03/14/20 1715    Subjective  "Bringing my arm out to the side is definitely the most challenging exercise for me"    Pertinent History sx removal of spinal accessory n. secondary to parotidectomy    Patient Stated Goals "Is it a pipe dream to get back to  normal?" I would like to build up my stamina.    Currently in Pain? No/denies            OT Treatments/Exercises (OP) - 03/14/20 1725      Shoulder Exercises: Standing   Row Strengthening;Both;Theraband;15 reps   min verbal/mod tactile cues needed to maintain shoulder adduction with elbow flexion during exercise   Theraband Level (Shoulder Row) Level 2 (Red)      Shoulder Exercises: Therapy Ball   Other Therapy Ball Exercises isometric scapular stabilization; pressing therapy ball into wall at head height, to the right, slightly below chest height, and to the left, using both hands   pt demo'd posterior pelvic tilt with forward rounded shoulders and forward head positioning while standing; v/c required to maintain body position in neutral     Hand Exercises   Other Hand Exercises hand grip exerciser used to facilitate grip stregthening; pt instructed to pick up 1" blocks with exerciser in R hand and place them in a bucket on ipsilateral side   able to complete exercise without difficulty     Neurological Re-education Exercises   Shoulder ABduction Right;AAROM;Standing   shoulder abduction wall climbs in standing; pt was unable to complete exercise without leaning to L and significant scapular winging; exercise was discontinued           OT Education - 03/14/20 1716    Education Details Education provided on scapular stabilization; OT reviewed and HEP    Person(s) Educated Patient    Methods Explanation;Demonstration    Comprehension Verbalized understanding            OT Short Term Goals - 03/14/20 1036      OT SHORT TERM GOAL #1   Title Pt will improve functional reach during light housekeeping tasks by increasing AROM of R shoulder flexion by at least 10 degrees    Baseline R shoulder flexion 100 degrees    Time 4    Period Weeks    Status Achieved   R shoulder flexion 125 degrees   Target Date 03/22/20      OT SHORT TERM GOAL #2   Title Pt will improve sustained  grasp of medium-weight bags by increasing grip strength of dominant R hand by at least 10 lbs.    Baseline R hand (dominant) - 54 lbs; L hand - 65 lbs    Time 4    Period Weeks    Status On-going      OT SHORT TERM GOAL #3   Title Pt will improve functional RUE use during ADLs by improving strength of R shoulder abduction to at least 3-/5    Baseline Pt unable to move > half ROM against gravity (2+/5)    Time 4    Period Weeks  Status On-going            OT Long Term Goals - 03/07/20 1744      OT LONG TERM GOAL #1   Title Pt will be independent with home carryover of RUE HEP    Time 8    Period Weeks    Status On-going   Pt created HEP tracking schedule, demonstrating good carryover at home     OT LONG TERM GOAL #2   Title Pt will retrieve lightweight items at or above chest height using RUE with pain less than 4/10 greater than 75% of the time    Baseline Pt reports being unable to lift objects at/above shoulder height with RUE    Time 8    Period Weeks    Status On-going      OT LONG TERM GOAL #3   Title Pt will improve functional UE use during leisure and caregiving activities by improving AROM of bilateral shoulder abduction by at least 10 degrees    Baseline R shoulder abduction - 72 degrees; L shoulder abduction 100 degrees    Time 8    Period Weeks    Status On-going      OT LONG TERM GOAL #4   Title Pt will independently identify at least 5 adaptive/compensatory strategies and/or pain management techniques to improve safety and facilitate pain relief during ADLs    Time 8    Period Weeks    Status On-going      OT LONG TERM GOAL #5   Title Pt will tolerate at least 10 min of physical activity within pt level of satisfaction to improve generalized endurance for participation in lawn care activities   pt stated goal   Time 8    Period Weeks    Status On-going            Plan - 03/14/20 1723    Clinical Impression Statement Shoulder flexion has improved  significantly since evaluation and pt reports noticing increased ROM during functional activities at home. Scapular winging, primarily with shoulder abduction, and increased elevation of R superomedial scapular border are notable with shoulder AROM; compensatory movement patterns that have developed are likely limiting flexibility of movement and causing some discomfort in the shoulder. Scapular stabilization exercises continue to be indicated. Due to this, HEP was updated to include both previously scapular stabilization exercises (instead of one) and taking away shoulder abduction exercise. Posterior pelvic tilt with forward head/rounded shoulders posture also appears to limit shoulder ROM.    OT Occupational Profile and History Detailed Assessment- Review of Records and additional review of physical, cognitive, psychosocial history related to current functional performance    Occupational performance deficits (Please refer to evaluation for details): ADL's;IADL's;Leisure    Body Structure / Function / Physical Skills ADL;Decreased knowledge of use of DME;Strength;GMC;Pain;UE functional use;Body mechanics;Endurance;IADL;ROM    Psychosocial Skills Environmental  Adaptations    Rehab Potential Good    Clinical Decision Making Several treatment options, min-mod task modification necessary    Comorbidities Affecting Occupational Performance: May have comorbidities impacting occupational performance    Modification or Assistance to Complete Evaluation  No modification of tasks or assist necessary to complete eval    OT Frequency 1x / week    OT Duration 8 weeks    OT Treatment/Interventions Self-care/ADL training;Moist Heat;DME and/or AE instruction;Contrast Bath;Compression bandaging;Therapeutic activities;Ultrasound;Therapeutic exercise;Cryotherapy;Iontophoresis;Electrical Stimulation;Paraffin;Energy conservation;Manual Therapy;Patient/family education;Passive range of motion    Plan continue poc;  shoulder and scapula strengthening    Consulted  and Agree with Plan of Care Patient           Patient will benefit from skilled therapeutic intervention in order to improve the following deficits and impairments:   Body Structure / Function / Physical Skills: ADL,Decreased knowledge of use of DME,Strength,GMC,Pain,UE functional use,Body mechanics,Endurance,IADL,ROM   Psychosocial Skills: Environmental  Adaptations   Visit Diagnosis: Right shoulder pain, unspecified chronicity  Muscle weakness (generalized)  Other symptoms and signs involving the musculoskeletal system    Problem List Patient Active Problem List   Diagnosis Date Noted  . 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 Basal cell carcinoma (BCC) of skin of neck 05/03/2019  . Polycythemia      Kathrine Cords, OTR/L, MSOT 03/14/2020, 6:11 PM  [place]. [place], [place], [address] Phone: [phone]   Fax:  [phone]  Name: Martin Curry MRN: [id number] Date of Birth: 1932-03-10

## 2020-03-21 ENCOUNTER — Encounter: Payer: Self-pay | Admitting: Occupational Therapy

## 2020-03-21 ENCOUNTER — Ambulatory Visit: Payer: PPO | Admitting: Occupational Therapy

## 2020-03-21 ENCOUNTER — Other Ambulatory Visit: Payer: Self-pay

## 2020-03-21 DIAGNOSIS — M25511 Pain in right shoulder: Secondary | ICD-10-CM

## 2020-03-21 DIAGNOSIS — M6281 Muscle weakness (generalized): Secondary | ICD-10-CM | POA: Diagnosis not present

## 2020-03-21 DIAGNOSIS — R29898 Other symptoms and signs involving the musculoskeletal system: Secondary | ICD-10-CM

## 2020-03-21 NOTE — Therapy (Signed)
[facility] [address] [place], [place], [address] Phone: [phone]   Fax:  [phone]  Occupational Therapy Treatment  Patient Details  Name: Martin Curry MRN: [id number] Date of Birth: 1932/03/13 Referring Provider (OT): Serena Colonel, MD   Encounter Date: 03/21/2020   OT End of Session - 03/21/20 1109    Visit Number 6    Number of Visits 9   Pt requested limiting visits due to insurance resetting in new year   Date for OT Re-Evaluation 05/01/20    Authorization Type [employer]    OT Start Time 1102    OT Stop Time 1146    OT Time Calculation (min) 44 min    Equipment Utilized During Treatment dynamometer    Activity Tolerance Patient tolerated treatment well    Behavior During Therapy [facility] for tasks assessed/performed           Past Medical History:  Diagnosis Date  . Atypical nevus 09/07/2002   left abdomen-slight  . BCC (basal cell carcinoma of skin) 07/19/2014   left sideburn- +margin-exc.  [name] Kitchen BCC (basal cell carcinoma) 09/07/2002   right preauricular-exc., left crown of scalp-CX35FU  . BCC (basal cell carcinoma) 10/30/2003   right forehead-MOHS  . BCC (basal cell carcinoma) 01/07/2009   left scalp, left forearm  . BCC (basal cell carcinoma) 09/27/2014   left sideburn-free  . BCC (basal cell carcinoma) 07/16/2015   left scalp  . 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 History of kidney stones    Many years ago  . Hyperlipidemia   . Hypertension   . Hypothyroidism   . Nodular basal cell carcinoma (BCC) 11/21/2012   right side of scalp-CX35FU  . Nodular basal cell carcinoma (BCC) 06/12/2014   left sideburn-CX35FU/exc.  . Nodular basal cell carcinoma (BCC) 07/16/2015   left scalp-CX35FU  . Polycythemia   . Prostate cancer ([facility]) 1990's   total prostatectomy and adjuvant radiation. Last PSA was 0.02 in 09/2011  . SCC (squamous cell carcinoma) 04/03/2009   forehead-txpbx  . SCC (squamous cell  carcinoma) 07/16/2015   left front scalp  . SCC (squamous cell carcinoma) 07/16/2015   left front scalp-Cx35FU  . SCCA (squamous cell carcinoma) of skin 12/20/2019   Right forehead (in situ)  . Superficial basal cell carcinoma (BCC) 11/21/2012   behind left ear-CX35FU, midback-CX35FU, right forehead-CX35FU-exc  . Superficial basal cell carcinoma (BCC) 11/02/2017   left post neck-CX35FU  . Superficial nodular basal cell carcinoma (BCC) 12/20/2019   Left temporal scalp    Past Surgical History:  Procedure Laterality Date  . ADJACENT TISSUE TRANSFER/TISSUE REARRANGEMENT N/A 05/22/2019   Procedure: COMPLEX CLOSURE OF NECK WOUND;  Surgeon: Allena Napoleon, MD;  [facility];  Service: Plastics;  Laterality: N/A;  . ALLOGRAFT APPLICATION N/A 05/22/2019   Procedure: POSSIBLE FACIAL NERVE RECONSTRUCTION WITH NERVE ALLOGRAFT VS AUTOGRAFT;  Surgeon: Allena Napoleon, MD;  Location: [facility];  Service: Plastics;  Laterality: N/A;  . BLADDER SURGERY     due to prostatectomy.   [name] Kitchen CATARACT EXTRACTION     right  . COLONOSCOPY     neg in the past; due in 2014 with [place] Dr. Jarold Motto  . EYE SURGERY     50 years ago.   [name] Kitchen HERNIA REPAIR    . MOHS SURGERY    . NECK DISSECTION  05/22/2019    NECK DISSECTION  . PAROTIDECTOMY  05/22/2019   PAROTIDECTOMY with facial nerve dissection   .  PAROTIDECTOMY Right 05/22/2019   Procedure: PAROTIDECTOMY;  Surgeon: Izora Gala, MD;  Location: [facility];  Service: ENT;  Laterality: Right;  . PROSTATE SURGERY    . RADICAL NECK DISSECTION Right 05/22/2019   Procedure: RADICAL NECK DISSECTION;  Surgeon: Izora Gala, MD;  Location: [facility];  Service: ENT;  Laterality: Right;  . TONSILLECTOMY      There were no vitals filed for this visit.   Subjective Assessment - 03/21/20 1807    Subjective  "I have been doing my exercises religiously;" pt also reports noticing improvements in shoulder flexion and with functional grip strength    Pertinent History sx removal of  spinal accessory n. secondary to parotidectomy    Patient Stated Goals "Is it a pipe dream to get back to normal?" I would like to build up my stamina.    Currently in Pain? No/denies            OT Treatments/Exercises (OP) - 03/21/20 1815      Shoulder Exercises: Seated   Retraction AROM;Strengthening;Both;15 reps   Shoulder retraction with BUE extended behind pt completed while sitting EoM; holding position for ~5 sec. Mod tactile cues required to roll shoulders back during exercise. Tall mirror used to encourage neutral trunk (demo's L lateral trunk flexion)     Functional Reaching Activities   High Level Pt placed medium-weight ball onto each level of 3-tiered shelving unit above shoulder height to facilitate strengthening and functional overhead reach; completed activity 10x w/ RUE   ROM determined to be [facility]; pt demo'd no difficulty and reported no discomfort in R shoulder           OT Education - 03/21/20 2117    Education Details Education provided on current progress toward goals/achieved goals and plan to address on-going goals moving forward. Reviewed and updated HEP.    Person(s) Educated Patient    Methods Explanation;Demonstration    Comprehension Verbalized understanding            OT Short Term Goals - 03/21/20 1114      OT SHORT TERM GOAL #1   Title Pt will improve functional reach during light housekeeping tasks by increasing AROM of R shoulder flexion by at least 10 degrees    Baseline R shoulder flexion 100 degrees    Time 4    Period Weeks    Status Achieved   R shoulder flexion 125 degrees (03/14/20)   Target Date 03/22/20      OT SHORT TERM GOAL #2   Title Pt will improve sustained grasp of medium-weight bags by increasing grip strength of dominant R hand by at least 10 lbs.    Baseline R hand (dominant) - 54 lbs; L hand - 65 lbs    Time 4    Period Weeks    Status Achieved   R hand, 80 lbs; L hand, 75 lbs (03/21/20)     OT SHORT TERM GOAL #3    Title Pt will improve functional RUE use during ADLs by improving strength of R shoulder abduction to at least 3-/5    Baseline Pt unable to move > half ROM against gravity (2+/5)    Time 4    Period Weeks    Status On-going            OT Long Term Goals - 03/21/20 1130      OT LONG TERM GOAL #1   Title Pt will be independent with home carryover of RUE HEP  Time 8    Period Weeks    Status Achieved   Pt consistently demonstrates excellent carryover of HEP to home (03/21/20)     OT LONG TERM GOAL #2   Title Pt will retrieve lightweight items at or above chest height using RUE with pain less than 4/10 greater than 75% of the time    Baseline Pt reports being unable to lift objects at/above shoulder height with RUE    Time 8    Period Weeks    Status Achieved   Pt able to place/retrieve medium-weight object onto shelving above chest height with no pain in R shoulder; also verbalizes being able to complete at home/in community w/out difficulty (1/27)     OT LONG TERM GOAL #3   Title Pt will improve functional UE use during leisure and caregiving activities by improving AROM of bilateral shoulder abduction by at least 10 degrees    Baseline R shoulder abduction - 72 degrees; L shoulder abduction 100 degrees    Time 8    Period Weeks    Status On-going      OT LONG TERM GOAL #4   Title Pt will independently identify at least 5 adaptive/compensatory strategies and/or pain management techniques to improve safety and facilitate pain relief during ADLs    Time 8    Period Weeks    Status On-going      OT LONG TERM GOAL #5   Title Pt will tolerate at least 10 min of physical activity within pt level of satisfaction to improve generalized endurance for participation in lawn care activities   pt stated goal   Time 8    Period Weeks    Status On-going            Plan - 03/21/20 1809    Clinical Impression Statement Pt is progressing well and is consistently reporting excellent  carryover with HEP. OT and pt discussed reducing frequency of sessions to 1x every other week due to current progress and pt's request to continue with therapy longer while not adding any strain financially. Scapular stabilization exercises were updated in HEP to include most effective exercises.    OT Occupational Profile and History Detailed Assessment- Review of Records and additional review of physical, cognitive, psychosocial history related to current functional performance    Occupational performance deficits (Please refer to evaluation for details): ADL's;IADL's;Leisure    Body Structure / Function / Physical Skills ADL;Decreased knowledge of use of DME;Strength;GMC;Pain;UE functional use;Body mechanics;Endurance;IADL;ROM    Psychosocial Skills Environmental  Adaptations    Rehab Potential Good    Clinical Decision Making Several treatment options, min-mod task modification necessary    Comorbidities Affecting Occupational Performance: May have comorbidities impacting occupational performance    Modification or Assistance to Complete Evaluation  No modification of tasks or assist necessary to complete eval    OT Frequency 1x / week    OT Duration 8 weeks    OT Treatment/Interventions Self-care/ADL training;Moist Heat;DME and/or AE instruction;Contrast Bath;Compression bandaging;Therapeutic activities;Ultrasound;Therapeutic exercise;Cryotherapy;Iontophoresis;Electrical Stimulation;Paraffin;Energy conservation;Manual Therapy;Patient/family education;Passive range of motion    Plan continue poc; shoulder and scapula strengthening    Consulted and Agree with Plan of Care Patient           Patient will benefit from skilled therapeutic intervention in order to improve the following deficits and impairments:   Body Structure / Function / Physical Skills: ADL,Decreased knowledge of use of DME,Strength,GMC,Pain,UE functional use,Body mechanics,Endurance,IADL,ROM   Psychosocial Skills:  Environmental  Adaptations  Visit Diagnosis: Right shoulder pain, unspecified chronicity  Muscle weakness (generalized)  Other symptoms and signs involving the musculoskeletal system    Problem List Patient Active Problem List   Diagnosis Date Noted  . 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 Basal cell carcinoma (BCC) of skin of neck 05/03/2019  . Polycythemia      Kathrine Cords, OTR/L, MSOT 03/21/2020, 9:36 PM  [place]. [place], [place], [address] Phone: [phone]   Fax:  [phone]  Name: Martin Curry MRN: [id number] Date of Birth: 08-Mar-1932

## 2020-03-22 ENCOUNTER — Telehealth: Payer: Self-pay | Admitting: Medical

## 2020-03-22 NOTE — Telephone Encounter (Signed)
Rescheduled 03/25 to 03/24 due to pal, patient has been called and notified.

## 2020-04-04 ENCOUNTER — Ambulatory Visit: Payer: PPO | Attending: Otolaryngology | Admitting: Occupational Therapy

## 2020-04-04 ENCOUNTER — Encounter: Payer: Self-pay | Admitting: Occupational Therapy

## 2020-04-04 ENCOUNTER — Other Ambulatory Visit: Payer: Self-pay

## 2020-04-04 DIAGNOSIS — M25511 Pain in right shoulder: Secondary | ICD-10-CM | POA: Insufficient documentation

## 2020-04-04 DIAGNOSIS — M6281 Muscle weakness (generalized): Secondary | ICD-10-CM | POA: Diagnosis not present

## 2020-04-04 DIAGNOSIS — R29898 Other symptoms and signs involving the musculoskeletal system: Secondary | ICD-10-CM | POA: Insufficient documentation

## 2020-04-04 NOTE — Therapy (Signed)
[facility] [place], [place], [address] Phone: [phone]   Fax:  [phone]  Occupational Therapy Treatment  Patient Details  Name: Martin Curry MRN: [id number] Date of Birth: 02/11/1933 Referring Provider (OT): Izora Gala, MD   Encounter Date: 04/04/2020   OT End of Session - 04/04/20 1110    Visit Number 7    Number of Visits 9   Pt requested limiting visits due to insurance resetting in new year   Date for OT Re-Evaluation 05/01/20    Authorization Type [employer]    OT Start Time 1100    OT Stop Time 1143    OT Time Calculation (min) 43 min    Equipment Utilized During Treatment --    Activity Tolerance Patient tolerated treatment well    Behavior During Therapy [facility] for tasks assessed/performed           Past Medical History:  Diagnosis Date  . Atypical nevus 09/07/2002   left abdomen-slight  . BCC (basal cell carcinoma of skin) 07/19/2014   left sideburn- +margin-exc.  [name] Kitchen BCC (basal cell carcinoma) 09/07/2002   right preauricular-exc., left crown of scalp-CX35FU  . BCC (basal cell carcinoma) 10/30/2003   right forehead-MOHS  . BCC (basal cell carcinoma) 01/07/2009   left scalp, left forearm  . BCC (basal cell carcinoma) 09/27/2014   left sideburn-free  . BCC (basal cell carcinoma) 07/16/2015   left scalp  . 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 History of kidney stones    Many years ago  . Hyperlipidemia   . Hypertension   . Hypothyroidism   . Nodular basal cell carcinoma (BCC) 11/21/2012   right side of scalp-CX35FU  . Nodular basal cell carcinoma (BCC) 06/12/2014   left sideburn-CX35FU/exc.  . Nodular basal cell carcinoma (BCC) 07/16/2015   left scalp-CX35FU  . Polycythemia   . Prostate cancer ([place]) 1990's   total prostatectomy and adjuvant radiation. Last PSA was 0.02 in 09/2011  . SCC (squamous cell carcinoma) 04/03/2009   forehead-txpbx  . SCC (squamous cell carcinoma)  07/16/2015   left front scalp  . SCC (squamous cell carcinoma) 07/16/2015   left front scalp-Cx35FU  . SCCA (squamous cell carcinoma) of skin 12/20/2019   Right forehead (in situ)  . Superficial basal cell carcinoma (BCC) 11/21/2012   behind left ear-CX35FU, midback-CX35FU, right forehead-CX35FU-exc  . Superficial basal cell carcinoma (BCC) 11/02/2017   left post neck-CX35FU  . Superficial nodular basal cell carcinoma (BCC) 12/20/2019   Left temporal scalp    Past Surgical History:  Procedure Laterality Date  . ADJACENT TISSUE TRANSFER/TISSUE REARRANGEMENT N/A 05/22/2019   Procedure: COMPLEX CLOSURE OF NECK WOUND;  Surgeon: Cindra Presume, MD;  Location: [facility];  Service: Plastics;  Laterality: N/A;  . ALLOGRAFT APPLICATION N/A 2/99/2426   Procedure: POSSIBLE FACIAL NERVE RECONSTRUCTION WITH NERVE ALLOGRAFT VS AUTOGRAFT;  Surgeon: Cindra Presume, MD;  Location: [facility];  Service: Plastics;  Laterality: N/A;  . BLADDER SURGERY     due to prostatectomy.   [name] Kitchen CATARACT EXTRACTION     right  . COLONOSCOPY     neg in the past; due in 2014 with  Dr. Sharlett Iles  . EYE SURGERY     50 years ago.   [name] Kitchen HERNIA REPAIR    . MOHS SURGERY    . NECK DISSECTION  05/22/2019    NECK DISSECTION  . PAROTIDECTOMY  05/22/2019   PAROTIDECTOMY with facial nerve dissection   .  PAROTIDECTOMY Right 05/22/2019   Procedure: PAROTIDECTOMY;  Surgeon: Izora Gala, MD;  Location: [facility];  Service: ENT;  Laterality: Right;  . PROSTATE SURGERY    . RADICAL NECK DISSECTION Right 05/22/2019   Procedure: RADICAL NECK DISSECTION;  Surgeon: Izora Gala, MD;  Location: [facility];  Service: ENT;  Laterality: Right;  . TONSILLECTOMY      There were no vitals filed for this visit.   Subjective Assessment - 04/04/20 1104    Subjective  Pt reports things have been going well and that he believes he has really been able to do more with his RUE than he could before coming to OT.    Pertinent History sx removal of spinal  accessory n. secondary to parotidectomy    Patient Stated Goals "Is it a pipe dream to get back to normal?" I would like to build up my stamina.    Currently in Pain? No/denies            OT Treatments/Exercises (OP) - 04/04/20 1121      Shoulder Exercises: Seated   Retraction Strengthening;Both;15 reps;Theraband   Shoulder retraction/ext rotation while sitting EoM; 3 sets. OT problem-solved with pt to find most effective positioning during exercise. Pt instructed to resist against band when returning to midline; mod v/c to roll shoulders back during exercise   Theraband Level (Shoulder Retraction) Level 2 (Red)   Pt attempted 1 set of 15 reps with yellow theraband (level 1), but movement pattern was more appropriate with red theraband; red band administered to pt     Shoulder Exercises: ROM/Strengthening   UBE (Upper Arm Bike) 10 min for bilateral shoulder ROM   Pt completed without difficulty            OT Education - 04/04/20 2030    Education Details OT reviewed current HEP, limitations of daily functional activities, and progress since last visit. Education also provided on nerve regeneration.    Person(s) Educated Patient    Methods Explanation;Demonstration;Handout    Comprehension Verbalized understanding;Returned demonstration            OT Short Term Goals - 03/21/20 1114      OT SHORT TERM GOAL #1   Title Pt will improve functional reach during light housekeeping tasks by increasing AROM of R shoulder flexion by at least 10 degrees    Baseline R shoulder flexion 100 degrees    Time 4    Period Weeks    Status Achieved   R shoulder flexion 125 degrees (03/14/20)   Target Date 03/22/20      OT SHORT TERM GOAL #2   Title Pt will improve sustained grasp of medium-weight bags by increasing grip strength of dominant R hand by at least 10 lbs.    Baseline R hand (dominant) - 54 lbs; L hand - 65 lbs    Time 4    Period Weeks    Status Achieved   R hand, 80 lbs; L  hand, 75 lbs (03/21/20)     OT SHORT TERM GOAL #3   Title Pt will improve functional RUE use during ADLs by improving strength of R shoulder abduction to at least 3-/5    Baseline Pt unable to move > half ROM against gravity (2+/5)    Time 4    Period Weeks    Status On-going             OT Long Term Goals - 04/04/20 1803      OT LONG  TERM GOAL #1   Title Pt will be independent with home carryover of RUE HEP    Time 8    Period Weeks    Status Achieved   Pt consistently demonstrates excellent carryover of HEP to home (03/21/20)     OT LONG TERM GOAL #2   Title Pt will retrieve lightweight items at or above chest height using RUE with pain less than 4/10 greater than 75% of the time    Baseline Pt reports being unable to lift objects at/above shoulder height with RUE    Time 8    Period Weeks    Status Achieved   Pt able to place/retrieve medium-weight object onto shelving above chest height with no pain in R shoulder; also verbalizes being able to complete at home/in community w/out difficulty (1/27)     OT LONG TERM GOAL #3   Title Pt will improve functional UE use during leisure and caregiving activities by improving AROM of bilateral shoulder abduction by at least 10 degrees    Baseline R shoulder abduction - 72 degrees; L shoulder abduction 100 degrees    Time 8    Period Weeks    Status On-going      OT LONG TERM GOAL #4   Title Pt will independently identify at least 5 adaptive/compensatory strategies and/or pain management techniques to improve safety and facilitate pain relief during ADLs    Time 8    Period Weeks    Status On-going      OT LONG TERM GOAL #5   Title Pt will tolerate at least 10 min of physical activity within pt level of satisfaction to improve generalized endurance for participation in lawn care activities   pt stated goal   Time 8    Period Weeks    Status Achieved   Pt able to complete UE ergometer exercise for 10 minutes without difficulty  (04/04/20)            Plan - 04/04/20 1716    Clinical Impression Statement Pt is progressing well and continues with excellent carryover of HEP. Scapular stabilization exercises continue to be indicated, but pt exhibited increased ability to incorporate strengthening component without demonstrating compensatory movement patterns this session.    OT Occupational Profile and History Detailed Assessment- Review of Records and additional review of physical, cognitive, psychosocial history related to current functional performance    Occupational performance deficits (Please refer to evaluation for details): ADL's;IADL's;Leisure    Body Structure / Function / Physical Skills ADL;Decreased knowledge of use of DME;Strength;GMC;Pain;UE functional use;Body mechanics;Endurance;IADL;ROM    Psychosocial Skills Environmental  Adaptations    Rehab Potential Good    Clinical Decision Making Several treatment options, min-mod task modification necessary    Comorbidities Affecting Occupational Performance: May have comorbidities impacting occupational performance    Modification or Assistance to Complete Evaluation  No modification of tasks or assist necessary to complete eval    OT Frequency 1x / week    OT Duration 8 weeks    OT Treatment/Interventions Self-care/ADL training;Moist Heat;DME and/or AE instruction;Contrast Bath;Compression bandaging;Therapeutic activities;Ultrasound;Therapeutic exercise;Cryotherapy;Iontophoresis;Electrical Stimulation;Paraffin;Energy conservation;Manual Therapy;Patient/family education;Passive range of motion    Plan continue poc; shoulder and scapula strengthening; reassess shoulder abduction    Consulted and Agree with Plan of Care Patient           Patient will benefit from skilled therapeutic intervention in order to improve the following deficits and impairments:   Body Structure / Function / Physical Skills: ADL,Decreased knowledge of  use of DME,Strength,GMC,Pain,UE  functional use,Body mechanics,Endurance,IADL,ROM   Psychosocial Skills: Environmental  Adaptations   Visit Diagnosis: Right shoulder pain, unspecified chronicity  Muscle weakness (generalized)  Other symptoms and signs involving the musculoskeletal system    Problem List Patient Active Problem List   Diagnosis Date Noted  . 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 Basal cell carcinoma (BCC) of skin of neck 05/03/2019  . Polycythemia      Kathrine Cords, OTR/L, MSOT 04/04/2020, 9:08 PM  [place] [place], [place], [address] Phone: [phone]   Fax:  [phone]  Name: MONTEL VANDERHOOF MRN: [id number] Date of Birth: [birth year]

## 2020-04-18 ENCOUNTER — Other Ambulatory Visit: Payer: Self-pay

## 2020-04-18 ENCOUNTER — Ambulatory Visit: Payer: PPO | Admitting: Occupational Therapy

## 2020-04-18 ENCOUNTER — Encounter: Payer: Self-pay | Admitting: Occupational Therapy

## 2020-04-18 DIAGNOSIS — M6281 Muscle weakness (generalized): Secondary | ICD-10-CM

## 2020-04-18 DIAGNOSIS — M25511 Pain in right shoulder: Secondary | ICD-10-CM | POA: Diagnosis not present

## 2020-04-18 DIAGNOSIS — R29898 Other symptoms and signs involving the musculoskeletal system: Secondary | ICD-10-CM

## 2020-04-18 NOTE — Therapy (Signed)
[facility] [place], [place], [address] Phone: [phone]   Fax:  [phone]  Occupational Therapy Treatment  Patient Details  Name: Martin Curry MRN: [id number] Date of Birth: 14-Sep-1932 Referring Provider (OT): Izora Gala, MD   Encounter Date: 04/18/2020   OT End of Session - 04/18/20 1019    Visit Number 8    Number of Visits 9   Pt requested limiting visits due to insurance resetting in new year   Date for OT Re-Evaluation 05/01/20    Authorization Type [employer]    OT Start Time 1016    OT Stop Time 1100    OT Time Calculation (min) 44 min    Activity Tolerance Patient tolerated treatment well    Behavior During Therapy [facility] for tasks assessed/performed           Past Medical History:  Diagnosis Date  . Atypical nevus 09/07/2002   left abdomen-slight  . BCC (basal cell carcinoma of skin) 07/19/2014   left sideburn- +margin-exc.  [name] Kitchen BCC (basal cell carcinoma) 09/07/2002   right preauricular-exc., left crown of scalp-CX35FU  . BCC (basal cell carcinoma) 10/30/2003   right forehead-MOHS  . BCC (basal cell carcinoma) 01/07/2009   left scalp, left forearm  . BCC (basal cell carcinoma) 09/27/2014   left sideburn-free  . BCC (basal cell carcinoma) 07/16/2015   left scalp  . 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 History of kidney stones    Many years ago  . Hyperlipidemia   . Hypertension   . Hypothyroidism   . Nodular basal cell carcinoma (BCC) 11/21/2012   right side of scalp-CX35FU  . Nodular basal cell carcinoma (BCC) 06/12/2014   left sideburn-CX35FU/exc.  . Nodular basal cell carcinoma (BCC) 07/16/2015   left scalp-CX35FU  . Polycythemia   . Prostate cancer ([place]) 1990's   total prostatectomy and adjuvant radiation. Last PSA was 0.02 in 09/2011  . SCC (squamous cell carcinoma) 04/03/2009   forehead-txpbx  . SCC (squamous cell carcinoma) 07/16/2015   left front scalp  . SCC  (squamous cell carcinoma) 07/16/2015   left front scalp-Cx35FU  . SCCA (squamous cell carcinoma) of skin 12/20/2019   Right forehead (in situ)  . Superficial basal cell carcinoma (BCC) 11/21/2012   behind left ear-CX35FU, midback-CX35FU, right forehead-CX35FU-exc  . Superficial basal cell carcinoma (BCC) 11/02/2017   left post neck-CX35FU  . Superficial nodular basal cell carcinoma (BCC) 12/20/2019   Left temporal scalp    Past Surgical History:  Procedure Laterality Date  . ADJACENT TISSUE TRANSFER/TISSUE REARRANGEMENT N/A 05/22/2019   Procedure: COMPLEX CLOSURE OF NECK WOUND;  Surgeon: Cindra Presume, MD;  Location: [place] [facility];  Service: Plastics;  Laterality: N/A;  . ALLOGRAFT APPLICATION N/A 05/14/252   Procedure: POSSIBLE FACIAL NERVE RECONSTRUCTION WITH NERVE ALLOGRAFT VS AUTOGRAFT;  Surgeon: Cindra Presume, MD;  Location: [facility];  Service: Plastics;  Laterality: N/A;  . BLADDER SURGERY     due to prostatectomy.   [name] Kitchen CATARACT EXTRACTION     right  . COLONOSCOPY     neg in the past; due in 2014 with [place] Dr. Sharlett Iles  . EYE SURGERY     50 years ago.   [name] Kitchen HERNIA REPAIR    . MOHS SURGERY    . NECK DISSECTION  05/22/2019    NECK DISSECTION  . PAROTIDECTOMY  05/22/2019   PAROTIDECTOMY with facial nerve dissection   . PAROTIDECTOMY Right 05/22/2019  Procedure: PAROTIDECTOMY;  Surgeon: Izora Gala, MD;  Location: [facility];  Service: ENT;  Laterality: Right;  . PROSTATE SURGERY    . RADICAL NECK DISSECTION Right 05/22/2019   Procedure: RADICAL NECK DISSECTION;  Surgeon: Izora Gala, MD;  Location: [facility];  Service: ENT;  Laterality: Right;  . TONSILLECTOMY      There were no vitals filed for this visit.   Subjective Assessment - 04/18/20 1611    Subjective  Pt stated that he is really happy with the progress he's made since he has been in therapy and that he is feeling comfortable with d/c after next appointment.    Pertinent History sx removal of spinal accessory n. secondary to  parotidectomy    Patient Stated Goals "Is it a pipe dream to get back to normal?" I would like to build up my stamina.    Currently in Pain? No/denies            OT Treatments/Exercises (OP) - 04/18/20 1638      Shoulder Exercises: Standing   Flexion Strengthening;Right;10 reps;Theraband   OT instructed pt to include exercise in current HEP and complete it standing with his back against a wall when able in order to prevent compensatory pattern of posterior trunk flexion; Completed 2 sets of 10 reps   Theraband Level (Shoulder Flexion) Level 1 (Yellow)    ABduction AROM;Right;10 reps;Limitations   OT instructed pt to complete exercise standing w/ L side against wall to provide cueing to maintain trunk at midline and not demo compensatory lateral trunk flexion to L side; Completed 2 sets of 10 reps w/ AROM measurement taken at end range   Retraction Strengthening;Right;10 reps;Theraband   Completed with anchored resistance; Exercise included in HEP   Theraband Level (Shoulder Retraction) Level 1 (Yellow)            OT Education - 04/18/20 1613    Education Details OT provided education on potential impact of spinal accessory nerve involvement and encouraged pt to ask questions during his upcoming follow-up visit with the physician. OT also discussed pt's progress and reviewed current HEP as well as updated/upgraded exercises for HEP; handout provided    Person(s) Educated Patient    Methods Explanation;Demonstration;Handout    Comprehension Verbalized understanding;Returned demonstration            OT Short Term Goals - 03/21/20 1114      OT SHORT TERM GOAL #1   Title Pt will improve functional reach during light housekeeping tasks by increasing AROM of R shoulder flexion by at least 10 degrees    Baseline R shoulder flexion 100 degrees    Time 4    Period Weeks    Status Achieved   R shoulder flexion 125 degrees (03/14/20)   Target Date 03/22/20      OT SHORT TERM GOAL #2    Title Pt will improve sustained grasp of medium-weight bags by increasing grip strength of dominant R hand by at least 10 lbs.    Baseline R hand (dominant) - 54 lbs; L hand - 65 lbs    Time 4    Period Weeks    Status Achieved   R hand, 80 lbs; L hand, 75 lbs (03/21/20)     OT SHORT TERM GOAL #3   Title Pt will improve functional RUE use during ADLs by improving strength of R shoulder abduction to at least 3-/5    Baseline Pt unable to move > half ROM against gravity (2+/5)  Time 4    Period Weeks    Status On-going            OT Long Term Goals - 04/04/20 1803      OT LONG TERM GOAL #1   Title Pt will be independent with home carryover of RUE HEP    Time 8    Period Weeks    Status Achieved   Pt consistently demonstrates excellent carryover of HEP to home (03/21/20)     OT LONG TERM GOAL #2   Title Pt will retrieve lightweight items at or above chest height using RUE with pain less than 4/10 greater than 75% of the time    Baseline Pt reports being unable to lift objects at/above shoulder height with RUE    Time 8    Period Weeks    Status Achieved   Pt able to place/retrieve medium-weight object onto shelving above chest height with no pain in R shoulder; also verbalizes being able to complete at home/in community w/out difficulty (1/27)     OT LONG TERM GOAL #3   Title Pt will improve functional UE use during leisure and caregiving activities by improving AROM of bilateral shoulder abduction by at least 10 degrees    Baseline R shoulder abduction - 72 degrees; L shoulder abduction 100 degrees    Time 8    Period Weeks    Status On-going      OT LONG TERM GOAL #4   Title Pt will independently identify at least 5 adaptive/compensatory strategies and/or pain management techniques to improve safety and facilitate pain relief during ADLs    Time 8    Period Weeks    Status On-going      OT LONG TERM GOAL #5   Title Pt will tolerate at least 10 min of physical activity  within pt level of satisfaction to improve generalized endurance for participation in lawn care activities   pt stated goal   Time 8    Period Weeks    Status Achieved   Pt able to complete UE ergometer exercise for 10 minutes without difficulty (04/04/20)           Plan - 04/18/20 1616    Clinical Impression Statement OT palpated decreased scapular winging at rest and with R shoulder movement, which is an improvement from previous session. Pt is also able to achieve slightly increased shoulder abduction w/out demo'ing compensatory pattern of lateral trunk lean to contralateral side and reach 126 degrees of R shoulder flexion (L shoulder 130 degrees at eval) without discomfort. HEP was updated to include light strengthening exercises due to pt's progress and status with current HEP. Pt is progressing well and continues with excellent carryover of exercise program at home.    OT Occupational Profile and History Detailed Assessment- Review of Records and additional review of physical, cognitive, psychosocial history related to current functional performance    Occupational performance deficits (Please refer to evaluation for details): ADL's;IADL's;Leisure    Body Structure / Function / Physical Skills ADL;Decreased knowledge of use of DME;Strength;GMC;Pain;UE functional use;Body mechanics;Endurance;IADL;ROM    Psychosocial Skills Environmental  Adaptations    Rehab Potential Good    Clinical Decision Making Several treatment options, min-mod task modification necessary    Comorbidities Affecting Occupational Performance: May have comorbidities impacting occupational performance    Modification or Assistance to Complete Evaluation  No modification of tasks or assist necessary to complete eval    OT Frequency 1x / week  OT Duration 8 weeks    OT Treatment/Interventions Self-care/ADL training;Moist Heat;DME and/or AE instruction;Contrast Bath;Compression bandaging;Therapeutic  activities;Ultrasound;Therapeutic exercise;Cryotherapy;Iontophoresis;Electrical Stimulation;Paraffin;Energy conservation;Manual Therapy;Patient/family education;Passive range of motion    Plan Update HEP and prepare for d/c    Consulted and Agree with Plan of Care Patient           Patient will benefit from skilled therapeutic intervention in order to improve the following deficits and impairments:   Body Structure / Function / Physical Skills: ADL,Decreased knowledge of use of DME,Strength,GMC,Pain,UE functional use,Body mechanics,Endurance,IADL,ROM   Psychosocial Skills: Environmental  Adaptations   Visit Diagnosis: Right shoulder pain, unspecified chronicity  Muscle weakness (generalized)  Other symptoms and signs involving the musculoskeletal system    Problem List Patient Active Problem List   Diagnosis Date Noted  . 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 Basal cell carcinoma (BCC) of skin of neck 05/03/2019  . Polycythemia      Kathrine Cords, OTR/L, MSOT 04/18/2020, 4:52 PM  [facility]. [place], [place], [address] Phone: [phone]   Fax:  [phone]  Name: Martin Curry MRN: [id number] Date of Birth: [birth year]

## 2020-04-22 DIAGNOSIS — H524 Presbyopia: Secondary | ICD-10-CM | POA: Diagnosis not present

## 2020-04-22 DIAGNOSIS — Z961 Presence of intraocular lens: Secondary | ICD-10-CM | POA: Diagnosis not present

## 2020-04-23 DIAGNOSIS — C4441 Basal cell carcinoma of skin of scalp and neck: Secondary | ICD-10-CM | POA: Diagnosis not present

## 2020-05-02 ENCOUNTER — Encounter: Payer: Self-pay | Admitting: Occupational Therapy

## 2020-05-02 ENCOUNTER — Ambulatory Visit: Payer: PPO | Attending: Otolaryngology | Admitting: Occupational Therapy

## 2020-05-02 DIAGNOSIS — M25511 Pain in right shoulder: Secondary | ICD-10-CM | POA: Insufficient documentation

## 2020-05-02 DIAGNOSIS — R29898 Other symptoms and signs involving the musculoskeletal system: Secondary | ICD-10-CM | POA: Diagnosis not present

## 2020-05-02 DIAGNOSIS — M6281 Muscle weakness (generalized): Secondary | ICD-10-CM | POA: Insufficient documentation

## 2020-05-03 NOTE — Therapy (Signed)
[facility]. [place], [place], [address] Phone: [phone]   Fax:  [phone]  Occupational Therapy Treatment & Discharge Summary  Patient Details  Name: Martin Curry MRN: [id number] Date of Birth: 08-17-1932 Referring Provider (OT): Izora Gala, MD   Encounter Date: 05/02/2020   OT End of Session - 05/02/20 1442    Visit Number 9    Number of Visits 9   Pt requested limiting visits due to insurance resetting in new year   Authorization Type [employer]    OT Start Time 1100    OT Stop Time 1143    OT Time Calculation (min) 43 min    Activity Tolerance Patient tolerated treatment well    Behavior During Therapy [facility] for tasks assessed/performed           Past Medical History:  Diagnosis Date  . Atypical nevus 09/07/2002   left abdomen-slight  . BCC (basal cell carcinoma of skin) 07/19/2014   left sideburn- +margin-exc.  [name] Kitchen BCC (basal cell carcinoma) 09/07/2002   right preauricular-exc., left crown of scalp-CX35FU  . BCC (basal cell carcinoma) 10/30/2003   right forehead-MOHS  . BCC (basal cell carcinoma) 01/07/2009   left scalp, left forearm  . BCC (basal cell carcinoma) 09/27/2014   left sideburn-free  . BCC (basal cell carcinoma) 07/16/2015   left scalp  . 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 History of kidney stones    Many years ago  . Hyperlipidemia   . Hypertension   . Hypothyroidism   . Nodular basal cell carcinoma (BCC) 11/21/2012   right side of scalp-CX35FU  . Nodular basal cell carcinoma (BCC) 06/12/2014   left sideburn-CX35FU/exc.  . Nodular basal cell carcinoma (BCC) 07/16/2015   left scalp-CX35FU  . Polycythemia   . Prostate cancer ([place]) 1990's   total prostatectomy and adjuvant radiation. Last PSA was 0.02 in 09/2011  . SCC (squamous cell carcinoma) 04/03/2009   forehead-txpbx  . SCC (squamous cell carcinoma) 07/16/2015   left front scalp  . SCC (squamous cell  carcinoma) 07/16/2015   left front scalp-Cx35FU  . SCCA (squamous cell carcinoma) of skin 12/20/2019   Right forehead (in situ)  . Superficial basal cell carcinoma (BCC) 11/21/2012   behind left ear-CX35FU, midback-CX35FU, right forehead-CX35FU-exc  . Superficial basal cell carcinoma (BCC) 11/02/2017   left post neck-CX35FU  . Superficial nodular basal cell carcinoma (BCC) 12/20/2019   Left temporal scalp    Past Surgical History:  Procedure Laterality Date  . ADJACENT TISSUE TRANSFER/TISSUE REARRANGEMENT N/A 05/22/2019   Procedure: COMPLEX CLOSURE OF NECK WOUND;  Surgeon: Cindra Presume, MD;  Location: [facility];  Service: Plastics;  Laterality: N/A;  . ALLOGRAFT APPLICATION N/A 1/75/1025   Procedure: POSSIBLE FACIAL NERVE RECONSTRUCTION WITH NERVE ALLOGRAFT VS AUTOGRAFT;  Surgeon: Cindra Presume, MD;  Location: [facility];  Service: Plastics;  Laterality: N/A;  . BLADDER SURGERY     due to prostatectomy.   [name] Kitchen CATARACT EXTRACTION     right  . COLONOSCOPY     neg in the past; due in 2014 with [place] Dr. Sharlett Iles  . EYE SURGERY     50 years ago.   [name] Kitchen HERNIA REPAIR    . MOHS SURGERY    . NECK DISSECTION  05/22/2019    NECK DISSECTION  . PAROTIDECTOMY  05/22/2019   PAROTIDECTOMY with facial nerve dissection   . PAROTIDECTOMY Right 05/22/2019   Procedure: PAROTIDECTOMY;  Surgeon: Constance Holster,  Garret Reddish, MD;  Location: [facility];  Service: ENT;  Laterality: Right;  . PROSTATE SURGERY    . RADICAL NECK DISSECTION Right 05/22/2019   Procedure: RADICAL NECK DISSECTION;  Surgeon: Izora Gala, MD;  Location: [facility];  Service: ENT;  Laterality: Right;  . TONSILLECTOMY      There were no vitals filed for this visit.   Subjective Assessment - 05/02/20 1435    Subjective  "I really don't think I would have made the progress that I did if I had not been coming to therapy and doing my exercises at home."    Pertinent History sx removal of spinal accessory n. secondary to parotidectomy    Patient Stated Goals  "Is it a pipe dream to get back to normal?" I would like to build up my stamina.    Currently in Pain? No/denies           OT Treatment - 05/02/20 1437 - See 'OT Education' section below for more - Therapist reviewed complete HEP w/ pt and answered pt questions/provided education on modifications to progress toward for multiple exercises, if needed; pt returning demonstration and handout was provided - OT also discussed progress and goals w/ pt, completing objective measurements for R shoulder AROM and MMT    OT Education - 05/02/20 1439    Education Details OT reviewed all exercises included in HEP and provided modifications of some exercises for pt to progress toward, as appropriate. OT also provided and discussed handout w/ pt regarding functional impact of spinal accessory nerve impairment and encouraged pt to direct more specific questions to his physician.    Person(s) Educated Patient    Methods Explanation;Demonstration;Handout    Comprehension Verbalized understanding;Returned demonstration            OT Short Term Goals - 05/02/20 1133      OT SHORT TERM GOAL #1   Title Pt will improve functional reach during light housekeeping tasks by increasing AROM of R shoulder flexion by at least 10 degrees    Baseline R shoulder flexion 100 degrees    Time 4    Period Weeks    Status Achieved   03/14/20 - R shoulder flexion 125 degrees   Target Date --      OT SHORT TERM GOAL #2   Title Pt will improve sustained grasp of medium-weight bags by increasing grip strength of dominant R hand by at least 10 lbs.    Baseline R hand (dominant) - 54 lbs; L hand - 65 lbs    Time 4    Period Weeks    Status Achieved   03/21/20 - R hand, 80 lbs; L hand, 75 lbs     OT SHORT TERM GOAL #3   Title Pt will improve functional RUE use during ADLs by improving strength of R shoulder abduction to at least 3-/5    Baseline Pt unable to move > half ROM against gravity (2+/5)    Time 4    Period  Weeks    Status Achieved   05/02/20 - R shoulder abduction 4-/5           OT Long Term Goals - 05/02/20 1141      OT LONG TERM GOAL #1   Title Pt will be independent with home carryover of RUE HEP    Time 8    Period Weeks    Status Achieved   03/21/20 - Pt consistently demonstrates excellent carryover of HEP to home  OT LONG TERM GOAL #2   Title Pt will retrieve lightweight items at or above chest height using RUE with pain less than 4/10 greater than 75% of the time    Baseline Pt reports being unable to lift objects at/above shoulder height with RUE    Time 8    Period Weeks    Status Achieved   03/21/20 - able to place/retrieve medium-weight object onto shelving above chest height with no pain in R shoulder; also verbalizes carryover to home/community w/out difficulty     OT LONG TERM GOAL #3   Title Pt will improve functional UE use during leisure and caregiving activities by improving AROM of bilateral shoulder abduction by at least 10 degrees    Baseline R shoulder abduction - 72 degrees; L shoulder abduction 100 degrees    Time 8    Period Weeks    Status Achieved   05/02/20 - R shoulder abduction (96 degrees); L shoulder (139 degrees)     OT LONG TERM GOAL #4   Title Pt will independently identify at least 5 adaptive/compensatory strategies and/or pain management techniques to improve safety and facilitate pain relief during ADLs    Time 8    Period Weeks    Status Unable to assess   Not applicable to pt's needs     OT LONG TERM GOAL #5   Title Pt will tolerate at least 10 min of physical activity within pt level of satisfaction to improve generalized endurance for participation in lawn care activities   pt stated goal   Time 8    Period Weeks    Status Achieved   04/04/20 - able to complete UE ergometer exercise for 10 minutes without difficulty           Plan - 05/02/20 1443    Clinical Impression Statement Pt is an 87 y.o. male who presents to OP OT with R  shoulder limitations 2/2 surgical impact on the spinal accessory n. during parotidectomy on 05/22/19. OT tx has incorporated ROM, strengthening, implementation of compensatory/adaptive strategies, development of pt-specific HEP, and pt education. Pt has shown improvements in all goals areas; achieving 3/3 STGs and 4/5 LTGs, with the remaining LTG no longer applicable. Due to both objective and functional improvements, pt is appropriate for d/c at this time and has verbalized comfort with this plan. Pt encouraged to call back with any changes or if any relevant functional deficits develop/occur.    OT Occupational Profile and History Detailed Assessment- Review of Records and additional review of physical, cognitive, psychosocial history related to current functional performance    Occupational performance deficits (Please refer to evaluation for details): ADL's;IADL's;Leisure    Body Structure / Function / Physical Skills ADL;Decreased knowledge of use of DME;Strength;GMC;Pain;UE functional use;Body mechanics;Endurance;IADL;ROM    Psychosocial Skills Environmental  Adaptations    Rehab Potential Good    Clinical Decision Making Several treatment options, min-mod task modification necessary    Comorbidities Affecting Occupational Performance: May have comorbidities impacting occupational performance    Modification or Assistance to Complete Evaluation  No modification of tasks or assist necessary to complete eval    OT Frequency 1x / week    OT Duration 8 weeks    OT Treatment/Interventions Self-care/ADL training;Moist Heat;DME and/or AE instruction;Contrast Bath;Compression bandaging;Therapeutic activities;Ultrasound;Therapeutic exercise;Cryotherapy;Iontophoresis;Electrical Stimulation;Paraffin;Energy conservation;Manual Therapy;Patient/family education;Passive range of motion    Plan --    Consulted and Agree with Plan of Care Patient  Patient will benefit from skilled therapeutic  intervention in order to improve the following deficits and impairments:   Body Structure / Function / Physical Skills: ADL,Decreased knowledge of use of DME,Strength,GMC,Pain,UE functional use,Body mechanics,Endurance,IADL,ROM   Psychosocial Skills: Environmental  Adaptations    OCCUPATIONAL THERAPY DISCHARGE SUMMARY  Visits from Start of Care: 8  Current functional level related to goals / functional outcomes: Pt reports being able to complete functional activities to pt-level of satisfaction and demonstrates improved functional RUE use, overhead reach, strength, and AROM of BUE during . Pt increased R shoulder flexion by 25 degrees, R shoulder abduction by 24 degrees, R shoulder strength to 4-/5, and grip strength by 26 lbs (R hand) and 10 lbs (L hand).   Remaining deficits: Decreased R shoulder abduction; abnormal posture/occasional compensatory movement patterns; decreased generalized endurance   Education / Equipment: Condition-specific education provided, as well as education on HEP, compensatory/adaptive strategies during functional tasks, functional UE use, and body mechanics. Plan: Patient agrees to discharge.  Patient goals were met. Patient is being discharged due to being pleased with the current functional level.  ?????      Visit Diagnosis: Right shoulder pain, unspecified chronicity  Muscle weakness (generalized)  Other symptoms and signs involving the musculoskeletal system    Problem List Patient Active Problem List   Diagnosis Date Noted  . 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 Basal cell carcinoma (BCC) of skin of neck 05/03/2019  . Polycythemia      Kathrine Cords, OTR/L, MSOT 05/03/2020, 3:00 PM  [facility]. [place], [place], [address] Phone: [phone]   Fax:  [phone]  Name: AKIEM URIETA MRN: [id number] Date of Birth: [birth year]

## 2020-05-16 ENCOUNTER — Inpatient Hospital Stay: Payer: PPO | Admitting: Medical

## 2020-05-16 ENCOUNTER — Inpatient Hospital Stay: Payer: PPO | Attending: Medical

## 2020-05-16 ENCOUNTER — Other Ambulatory Visit: Payer: Self-pay

## 2020-05-16 VITALS — BP 109/67 | HR 64 | Temp 98.4°F | Resp 17 | Ht 69.0 in | Wt 172.3 lb

## 2020-05-16 DIAGNOSIS — M791 Myalgia, unspecified site: Secondary | ICD-10-CM | POA: Insufficient documentation

## 2020-05-16 DIAGNOSIS — C4441 Basal cell carcinoma of skin of scalp and neck: Secondary | ICD-10-CM | POA: Insufficient documentation

## 2020-05-16 DIAGNOSIS — C07 Malignant neoplasm of parotid gland: Secondary | ICD-10-CM | POA: Insufficient documentation

## 2020-05-16 LAB — CMP (CANCER CENTER ONLY)
ALT: 27 U/L (ref 0–44)
AST: 26 U/L (ref 15–41)
Albumin: 4.1 g/dL (ref 3.5–5.0)
Alkaline Phosphatase: 75 U/L (ref 38–126)
Anion gap: 6 (ref 5–15)
BUN: 15 mg/dL (ref 8–23)
CO2: 30 mmol/L (ref 22–32)
Calcium: 9.9 mg/dL (ref 8.9–10.3)
Chloride: 107 mmol/L (ref 98–111)
Creatinine: 1.4 mg/dL — ABNORMAL HIGH (ref 0.61–1.24)
GFR, Estimated: 49 mL/min — ABNORMAL LOW (ref >60)
Glucose, Bld: 100 mg/dL — ABNORMAL HIGH (ref 70–99)
Potassium: 5 mmol/L (ref 3.5–5.1)
Sodium: 143 mmol/L (ref 135–145)
Total Bilirubin: 0.7 mg/dL (ref 0.3–1.2)
Total Protein: 6.9 g/dL (ref 6.5–8.1)

## 2020-05-16 LAB — CBC WITH DIFFERENTIAL (CANCER CENTER ONLY)
Abs Immature Granulocytes: 0.02 10*3/uL (ref 0.00–0.07)
Basophils Absolute: 0.1 10*3/uL (ref 0.0–0.1)
Basophils Relative: 1 %
Eosinophils Absolute: 0.2 10*3/uL (ref 0.0–0.5)
Eosinophils Relative: 3 %
HCT: 48.2 % (ref 39.0–52.0)
Hemoglobin: 16.3 g/dL (ref 13.0–17.0)
Immature Granulocytes: 0 %
Lymphocytes Relative: 18 %
Lymphs Abs: 1.1 10*3/uL (ref 0.7–4.0)
MCH: 31.9 pg (ref 26.0–34.0)
MCHC: 33.8 g/dL (ref 30.0–36.0)
MCV: 94.3 fL (ref 80.0–100.0)
Monocytes Absolute: 0.5 10*3/uL (ref 0.1–1.0)
Monocytes Relative: 9 %
Neutro Abs: 4 10*3/uL (ref 1.7–7.7)
Neutrophils Relative %: 69 %
Platelet Count: 222 10*3/uL (ref 150–400)
RBC: 5.11 MIL/uL (ref 4.22–5.81)
RDW: 13.2 % (ref 11.5–15.5)
WBC Count: 5.9 10*3/uL (ref 4.0–10.5)
nRBC: 0 % (ref 0.0–0.2)

## 2020-05-16 NOTE — Progress Notes (Signed)
[facility]       Telephone:([phone] Fax:([phone]) [facility]  Oral and Oropharyngeal Cancer     CLINIC:     Survivorship      REASON FOR VISIT:   Routine follow-up for history of oral and oropharyngeal Cancer ( parotid gland )      BRIEF ONCOLOGIC HISTORY:    Oncology History  Basal cell carcinoma (BCC) of skin of neck  05/02/2019 Cancer Staging   Staging form: Cutaneous Carcinoma of the Head and Neck, AJCC 8th Edition - Clinical stage from 05/02/2019: Stage III (cT3, cN0, cM0) - Signed by Eppie Gibson, MD on 05/03/2019   05/03/2019 Initial Diagnosis   Basal cell carcinoma (BCC) of skin of neck   06/16/2019 Cancer Staging   Staging form: Cutaneous Carcinoma of the Head and Neck, AJCC 8th Edition - Pathologic stage from 06/16/2019: Stage III (pT3, pN1, cM0) - Signed by Eppie Gibson, MD on 06/20/2019         INTERVAL HISTORY:    Martin Curry is a 87 y.o. male with a diagnosis of a  Stage III (cT3, cN0, cM0) cancer of the parotid gland . He presents to clinic today for routine follow up having last been seen by Dr. Eppie Gibson on 10/13/2019. He had a tube placed in his right ear yesterday. He repoprts some right shoulder muscle pain. He continues to be troubled with thick saliva. He now has dentures and is eating better; He sees Dr. Sharlett Iles in November, Dr. Constance Holster in 6 weeks and Dr. Isidore Moos on 02/07/2020.      REVIEW OF SYSTEMS:   Visual changes:    no   Swelling of the face:    no   Swelling of the mouth:    no   Swelling of the throat:    no   Dental problems:    The patient has dentures   Skin changes at treatment site:  no   Dry mouth:     no   Difficulty talking:    no  Thickened saliva:    yes   Increased mucus:    no  Difficulty opening mouth:   no  Fatigue:     no   Pain or difficulty swallowing or chewing: no    Loss of  appetite:    no   Numbness of the ear:    no  Weakness raising the arm(s):  no  Lack of lip movement:    no  Facial disfigurement:    no  Numbness of face or neck:   no  Loss of voice or impaired speech:  no  Hearing difficulty:    no  Lymphedema:     no  Pain:      no        -Weight: has not lost weight      -Last ENT visit:    unknown   -Last Rad Onc visit:   10/13/2019   -Last Dentist visit:   Patient now has dentures     CURRENT MEDICATIONS:    Current Outpatient Medications on File Prior to Visit  Medication Sig Dispense Refill  . amLODipine (NORVASC) 5 MG tablet Take 5 mg by mouth daily.     [name] Kitchen aspirin 81 MG tablet Take 81 mg by mouth daily.    . benazepril (LOTENSIN) 10  MG tablet Take 10 mg by mouth daily.    [name] Kitchen levothyroxine (SYNTHROID) 25 MCG tablet Take 25 mcg by mouth daily before breakfast.    . Multiple Vitamin (MULTIVITAMIN) tablet Take 1 tablet by mouth daily.    . potassium chloride (KLOR-CON) 10 MEQ tablet Take 10 mEq by mouth 3 (three) times daily.    . potassium citrate (UROCIT-K) 10 MEQ (1080 MG) SR tablet Take 10 mEq by mouth 3 (three) times daily with meals.    . simvastatin (ZOCOR) 20 MG tablet Take 20 mg by mouth every evening.    . [DISCONTINUED] promethazine (PHENERGAN) 25 MG suppository Place 1 suppository (25 mg total) rectally every 6 (six) hours as needed for nausea or vomiting. (Patient not taking: Reported on 05/31/2019) 12 suppository 1   No current facility-administered medications on file prior to visit.        ALLERGIES:    Allergies  Allergen Reactions  . Penicillins     Did it involve swelling of the face/tongue/throat, SOB, or low BP? Unknown Did it involve sudden or severe rash/hives, skin peeling, or any reaction on the inside of your mouth or nose? Unknown Did you need to seek medical attention at a hospital or doctor's office? Unknown When did it last happen?Childhood allergy If all above answers are "NO", may  proceed with cephalosporin use.   . Latex Rash  . Tape Rash    Skin gets red and a rash develops with "plastic tape" Skin gets red and a rash develops with "plastic tape"        ADDITIONAL REVIEW OF SYSTEMS:    Review of Systems  Constitutional: Negative for chills, diaphoresis, fever, malaise/fatigue and weight loss.  HENT: Negative for ear pain, hearing loss, sinus pain and sore throat.   Respiratory: Negative for cough, sputum production, shortness of breath and wheezing.   Cardiovascular: Negative for chest pain, palpitations, orthopnea and leg swelling.  Gastrointestinal: Negative for constipation, diarrhea, nausea and vomiting.  Genitourinary: Negative for frequency and urgency.  Musculoskeletal: Positive for myalgias.  Skin: Negative for rash.  Neurological: Negative for dizziness, weakness and headaches.        PHYSICAL EXAM:    There were no vitals filed for this visit.  There were no vitals filed for this visit.  Weight Date  172 pounds 4.8 ounces  05/16/2020  169 pounds 8 ounces 11/17/2019  169 pounds 1.6 ounces 09/27/2019            Pre-treatment (RT consult date):     General: Martin Curry is a 87 y.o. male who appears to be in no acute distress.? Martin Curry is accompanied/unaccompanied today.    HEENT:    Head: atraumatic and normocephalic.?    Pupils: equal and reactive to light.    Conjunctivae: clear without exudate.?    Sclerae: anicteric.    Oral mucosa: pink and moist without lesions.?    Tongue: pink, moist, and midline.  No lesions.  Oropharynx: pink and moist, without lesions.   Lymph: No preauricular, postauricular, cervical, supraclavicular, or infraclavicular lymphadenopathy noted on palpation. ?   Neck: No palpable masses. Skin on neck is without thickening.  Well-healed surgical incision in the right lateral neck with a small area of scar tissue in the superior aspect of the incision.  Cardiovascular: Regular rate and  rhythm without murmurs, rubs, or gallops.[name] Kitchen   Respiratory: Clear to auscultation bilaterally without wheezes, rales, or rhonchi. Chest expansion symmetric without accessory muscle use; breathing non-labored.?  GI: Abdomen soft and round. Bowel sounds normoactive. No hepatosplenomegaly, masses,  tenderness, rebound, guarding, or distention.   GU: Deferred. ?   Neuro: No focal deficits. Steady gait. ?   Psych: Normal mood and affect for situation.   Extremities: No edema.?   Skin: Warm and dry. No rashes or lesions.      LABORATORY DATA:    The patient's complete chemistry panel returned showing:   Lab Results  Component Value Date   NA 139 09/27/2019   NA 141 11/18/2011   K 4.1 09/27/2019   K 4.3 11/18/2011   CL 104 09/27/2019   CL 109 (H) 11/18/2011   CO2 25 09/27/2019   CO2 25 11/18/2011   GLUCOSE 139 (H) 09/27/2019   GLUCOSE 89 11/18/2011   BUN 15 02/01/2020   BUN 17.0 11/18/2011   CREATININE 1.25 (H) 02/01/2020   CREATININE 1.3 11/18/2011   ALBUMIN 3.8 09/27/2019   ALBUMIN 4.1 11/18/2011   ALKPHOS 90 09/27/2019   ALKPHOS 79 11/18/2011   ALT 36 09/27/2019   ALT 48 11/18/2011   AST 20 09/27/2019   AST 32 11/18/2011   BILITOT 0.7 09/27/2019   BILITOT 0.70 11/18/2011        The patient's TSH returned at: No results found for: TSH      The patient's complete blood count returned showing:      Component Value Date/Time   WBC 7.5 09/27/2019 1338   WBC 6.7 08/01/2019 1248   RBC 5.11 09/27/2019 1338   HGB 15.6 09/27/2019 1338   HGB 16.8 11/18/2011 1046   HCT 47.1 09/27/2019 1338   HCT 48.2 11/18/2011 1046   PLT 211 09/27/2019 1338   PLT 232 11/18/2011 1046   MCV 92.2 09/27/2019 1338   MCV 92.1 11/18/2011 1046   MCH 30.5 09/27/2019 1338   MCHC 33.1 09/27/2019 1338   RDW 13.9 09/27/2019 1338   RDW 13.9 11/18/2011 1046   LYMPHSABS 0.9 09/27/2019 1338   LYMPHSABS 1.4 11/18/2011 1046   MONOABS 0.6 09/27/2019 1338   MONOABS 0.6 11/18/2011 1046    EOSABS 0.1 09/27/2019 1338   EOSABS 0.2 11/18/2011 1046   BASOSABS 0.0 09/27/2019 1338   BASOSABS 0.0 11/18/2011 1046        DIAGNOSTIC IMAGING:    No results found.       ASSESSMENT & PLAN:    Martin Curry is a 87 y.o. male with a diagnosis of a Stage III (cT3, cN0, cM0) cancer of the parotid gland which was originally diagnosed on 05/02/2019. He was treated with radiation therapy which he completed treatment on 07/26/2019.  He presents to the survivorship clinic today for routine follow-up having last been seen on 11/17/2019.      1. 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         2. Nutritional status: Martin Curry reports that he is currently able to consume adequate nutrition by mouth.  His weight is stable at 172 pounds 4.8 ounces today.  He was encouraged to continue to consume adequate hydration and nutrition, as tolerated.        3. At risk for dysphagia: Given Martin Curry treatment for oral and oropharyngeal cancer ( parotid gland ), which included radiation therapy, he maybe at risk for chronic dysphagia.  He reports having mild difficulty with breads. He is able to consume soft foods and liquids without difficulty.  I encouraged him to continue to perform the swallowing exercises.        4.  At risk for neck lymphedema:  When patients with head & neck cancers are treated with surgery and/or radiation therapy, there is an associated increased risk of neck lymphedema.  Martin Curry reports that currently he is not experiencing symptoms.He has a neck compression garment and reports wearing it, as directed.  I encouraged Martin Curry to continue to wear the compression garment and practice the massage techniques to reduce the presence of lymphedema.  If his symptoms worsen, I would happy to place a formal referral to physical therapy for further evaluation and treatment.         DISPOSITION:    -See Dr. Constance Holster (ENT) in approximately 5 months  -Return to cancer center to see Dr. Isidore Moos on 11/08/2020.   -Return to cancer center to see Survivorship APP in 6 months.  He will be contacted with an appointment date and time.     A total of 45 minutes was spent in the face-to-face care of this patient, with greater than 50% of that time spent in counseling and care-coordination.       Sandi Mealy, MHS, PA-C  05/16/20   9:43 AM        NOTE: PRIMARY CARE PROVIDER   Leanna Battles, MD   Phone: [phone]   Fax: [phone]

## 2020-05-17 ENCOUNTER — Other Ambulatory Visit: Payer: PPO

## 2020-05-17 ENCOUNTER — Ambulatory Visit: Payer: PPO | Admitting: Medical

## 2020-05-27 ENCOUNTER — Encounter: Payer: Self-pay | Admitting: Dermatology

## 2020-05-27 ENCOUNTER — Ambulatory Visit (INDEPENDENT_AMBULATORY_CARE_PROVIDER_SITE_OTHER): Payer: PPO | Admitting: Dermatology

## 2020-05-27 ENCOUNTER — Other Ambulatory Visit: Payer: Self-pay | Admitting: Dermatology

## 2020-05-27 ENCOUNTER — Other Ambulatory Visit: Payer: Self-pay

## 2020-05-27 DIAGNOSIS — Z85828 Personal history of other malignant neoplasm of skin: Secondary | ICD-10-CM

## 2020-05-27 DIAGNOSIS — C44212 Basal cell carcinoma of skin of right ear and external auricular canal: Secondary | ICD-10-CM | POA: Diagnosis not present

## 2020-05-27 DIAGNOSIS — D044 Carcinoma in situ of skin of scalp and neck: Secondary | ICD-10-CM | POA: Diagnosis not present

## 2020-05-27 DIAGNOSIS — C44319 Basal cell carcinoma of skin of other parts of face: Secondary | ICD-10-CM | POA: Diagnosis not present

## 2020-05-27 DIAGNOSIS — D485 Neoplasm of uncertain behavior of skin: Secondary | ICD-10-CM

## 2020-05-27 NOTE — Patient Instructions (Signed)

## 2020-06-04 ENCOUNTER — Telehealth: Payer: Self-pay | Admitting: *Deleted

## 2020-06-04 NOTE — Telephone Encounter (Signed)
Pathology to patient-surgery appointment scheduled.  

## 2020-06-04 NOTE — Telephone Encounter (Signed)
-----   Message from Lavonna Monarch, MD sent at 06/04/2020  5:13 AM EDT ----- Schedule as 30 minutes with me but we will discuss at that time whether the sideburn lesion would better be done by [facility].

## 2020-06-08 ENCOUNTER — Encounter: Payer: Self-pay | Admitting: Dermatology

## 2020-06-08 NOTE — Progress Notes (Signed)
   Follow-Up Visit   Subjective  Martin Curry is a 87 y.o. male who presents for the following: Follow-up (Left preauricular area- healing okay - MOHS couldn't do it -sent to plastic surgery).  Follow-up skin check, new lesions scalp and right sideburn Location:  Duration:  Quality:  Associated Signs/Symptoms: Modifying Factors:  Severity:  Timing: Context:   Objective  Well appearing patient in no apparent distress; mood and affect are within normal limits. 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  Today I could find no evidence of cutaneous or lymphatic involvement in the exact area but there is likely a new primary BCC in right sideburn. If biopsy confirms, I will suggest removal with Mohs.    A focused examination was performed including Head, neck, upper torso, regional lymph nodes. Relevant physical exam findings are noted in the Assessment and Plan.   Assessment & Plan    Neoplasm of uncertain behavior of skin (2) Right Preauricular Area  Skin / nail biopsy Type of biopsy: tangential   Informed consent: discussed and consent obtained   Timeout: patient name, date of birth, surgical site, and procedure verified   Anesthesia: the lesion was anesthetized in a standard fashion   Anesthetic:  1% lidocaine w/ epinephrine 1-100,000 local infiltration Instrument used: flexible razor blade   Hemostasis achieved with: aluminum chloride and electrodesiccation   Outcome: patient tolerated procedure well   Post-procedure details: wound care instructions given    Specimen 1 - Surgical pathology Differential Diagnosis: bcc vs scc  Check Margins: No  Mid Parietal Scalp  Skin / nail biopsy Type  of biopsy: tangential   Informed consent: discussed and consent obtained   Timeout: patient name, date of birth, surgical site, and procedure verified   Anesthesia: the lesion was anesthetized in a standard fashion   Anesthetic:  1% lidocaine w/ epinephrine 1-100,000 local infiltration Instrument used: flexible razor blade   Hemostasis achieved with: aluminum chloride and electrodesiccation   Outcome: patient tolerated procedure well   Post-procedure details: wound care instructions given    Specimen 2 - Surgical pathology Differential Diagnosis: bcc vs scc  Check Margins: No  Personal history of skin cancer Right Anterior Neck  Continued follow-up by dermatology and ENT.      I, Martin Monarch, MD, have reviewed all documentation for this visit.  The documentation on 06/08/20 for the exam, diagnosis, procedures, and orders are all accurate and complete.

## 2020-06-16 ENCOUNTER — Ambulatory Visit
Admission: EM | Admit: 2020-06-16 | Discharge: 2020-06-16 | Disposition: A | Discharge status: Home / Self Care | Payer: PPO | Attending: Emergency Medicine | Admitting: Emergency Medicine

## 2020-06-16 ENCOUNTER — Ambulatory Visit (INDEPENDENT_AMBULATORY_CARE_PROVIDER_SITE_OTHER): Payer: PPO

## 2020-06-16 ENCOUNTER — Encounter: Payer: Self-pay | Admitting: Emergency Medicine

## 2020-06-16 ENCOUNTER — Other Ambulatory Visit: Payer: Self-pay

## 2020-06-16 DIAGNOSIS — M25552 Pain in left hip: Secondary | ICD-10-CM

## 2020-06-16 DIAGNOSIS — Y9302 Activity, running: Secondary | ICD-10-CM | POA: Diagnosis not present

## 2020-06-16 DIAGNOSIS — M25559 Pain in unspecified hip: Secondary | ICD-10-CM

## 2020-06-16 DIAGNOSIS — S76012A Strain of muscle, fascia and tendon of left hip, initial encounter: Secondary | ICD-10-CM

## 2020-06-16 MED ORDER — NAPROXEN 375 MG PO TABS: 375.0000 mg | ORAL_TABLET | Freq: Two times a day (BID) | ORAL | 0 refills | Status: DC

## 2020-06-16 NOTE — ED Provider Notes (Signed)
[facility]    CSN: [id number] Arrival date & time: 06/16/20  3536      History   Chief Complaint Chief Complaint  Patient presents with  . Hip Pain    HPI Martin Curry is a 87 y.o. male.   Martin Curry presents with complaints of Left hip pain which started 1 week ago. No traumatic injury. He was standing in his kitchen and tried to run to help his wife, and noted pain after this. Pain is worse with transitions from sit to stand and stand to sit. With mild pain with activity. Has waxed and waned in severity. No previous hip pain. No back pain. No radiation of pain. Tylenol yesterday didn't help with pain. No urinary symptoms.     ROS per HPI, negative if not otherwise mentioned.      Past Medical History:  Diagnosis Date  . Atypical nevus 09/07/2002   left abdomen-slight  . BCC (basal cell carcinoma of skin) 07/19/2014   left sideburn- +margin-exc.  [name] Kitchen BCC (basal cell carcinoma) 09/07/2002   right preauricular-exc., left crown of scalp-CX35FU  . BCC (basal cell carcinoma) 10/30/2003   right forehead-MOHS  . BCC (basal cell carcinoma) 01/07/2009   left scalp, left forearm  . BCC (basal cell carcinoma) 09/27/2014   left sideburn-free  . BCC (basal cell carcinoma) 07/16/2015   left scalp  . 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 History of kidney stones    Many years ago  . Hyperlipidemia   . Hypertension   . Hypothyroidism   . Nodular basal cell carcinoma (BCC) 11/21/2012   right side of scalp-CX35FU  . Nodular basal cell carcinoma (BCC) 06/12/2014   left sideburn-CX35FU/exc.  . Nodular basal cell carcinoma (BCC) 07/16/2015   left scalp-CX35FU  . Polycythemia   . Prostate cancer ([place]) 1990's   total prostatectomy and adjuvant radiation. Last PSA was 0.02 in 09/2011  . SCC (squamous cell carcinoma) 04/03/2009   forehead-txpbx  . SCC (squamous cell carcinoma) 07/16/2015   left front scalp  . SCC (squamous cell carcinoma) 07/16/2015   left  front scalp-Cx35FU  . SCCA (squamous cell carcinoma) of skin 12/20/2019   Right forehead (in situ)  . Superficial basal cell carcinoma (BCC) 11/21/2012   behind left ear-CX35FU, midback-CX35FU, right forehead-CX35FU-exc  . Superficial basal cell carcinoma (BCC) 11/02/2017   left post neck-CX35FU  . Superficial nodular basal cell carcinoma (BCC) 12/20/2019   Left temporal scalp    Patient Active Problem List   Diagnosis Date Noted  . 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 Basal cell carcinoma (BCC) of skin of neck 05/03/2019  . Polycythemia     Past Surgical History:  Procedure Laterality Date  . ADJACENT TISSUE TRANSFER/TISSUE REARRANGEMENT N/A 05/22/2019   Procedure: COMPLEX CLOSURE OF NECK WOUND;  Surgeon: Cindra Presume, MD;  Location: [place];  Service: Plastics;  Laterality: N/A;  . ALLOGRAFT APPLICATION N/A 1/44/3154   Procedure: POSSIBLE FACIAL NERVE RECONSTRUCTION WITH NERVE ALLOGRAFT VS AUTOGRAFT;  Surgeon: Cindra Presume, MD;  Location: [place];  Service: Plastics;  Laterality: N/A;  . BLADDER SURGERY     due to prostatectomy.   [name] Kitchen CATARACT EXTRACTION     right  . COLONOSCOPY     neg in the past; due in 2014 with [place] Dr. Sharlett Iles  . EYE SURGERY     50 years ago.   [name] Kitchen HERNIA REPAIR    . MOHS SURGERY    .  NECK DISSECTION  05/22/2019    NECK DISSECTION  . PAROTIDECTOMY  05/22/2019   PAROTIDECTOMY with facial nerve dissection   . PAROTIDECTOMY Right 05/22/2019   Procedure: PAROTIDECTOMY;  Surgeon: Izora Gala, MD;  Location: [place];  Service: ENT;  Laterality: Right;  . PROSTATE SURGERY    . RADICAL NECK DISSECTION Right 05/22/2019   Procedure: RADICAL NECK DISSECTION;  Surgeon: Izora Gala, MD;  Location: [place];  Service: ENT;  Laterality: Right;  . TONSILLECTOMY         Home Medications    Prior to Admission medications   Medication Sig Start Date End Date Taking? Authorizing Provider  amLODipine (NORVASC) 5 MG tablet Take 5 mg by mouth daily.  04/17/19   Yes [provider]  aspirin 81 MG tablet Take 81 mg by mouth daily.   Yes [provider]  benazepril (LOTENSIN) 10 MG tablet Take 10 mg by mouth daily.   Yes [provider]  levothyroxine (SYNTHROID) 25 MCG tablet Take 25 mcg by mouth daily before breakfast.   Yes [provider]  Multiple Vitamin (MULTIVITAMIN) tablet Take 1 tablet by mouth daily.   Yes [provider]  naproxen (NAPROSYN) 375 MG tablet Take 1 tablet (375 mg total) by mouth 2 (two) times daily with a meal. 06/16/20  Yes Tinley Rought B, NP  potassium chloride (KLOR-CON) 10 MEQ tablet Take 10 mEq by mouth 3 (three) times daily. 01/24/20  Yes [provider]  simvastatin (ZOCOR) 20 MG tablet Take 20 mg by mouth every evening.   Yes [provider]  potassium citrate (UROCIT-K) 10 MEQ (1080 MG) SR tablet Take 10 mEq by mouth 3 (three) times daily with meals.    [provider]  promethazine (PHENERGAN) 25 MG suppository Place 1 suppository (25 mg total) rectally every 6 (six) hours as needed for nausea or vomiting. Patient not taking: Reported on 05/31/2019 05/23/19 08/18/19  Izora Gala, MD    Family History Family History  Problem Relation Age of Onset  . Uterine cancer Mother 13  . Lung cancer Father 10  . Prostate cancer Brother   . Prostate cancer Brother     Social History Social History   Tobacco Use  . Smoking status: Never Smoker  . Smokeless tobacco: Never Used  Vaping Use  . Vaping Use: Never used  Substance Use Topics  . Alcohol use: No  . Drug use: No     Allergies   Penicillins, Latex, and Tape   Review of Systems Review of Systems   Physical Exam Triage Vital Signs ED Triage Vitals  Enc Vitals Group     BP 06/16/20 1055 131/70     Pulse Rate 06/16/20 1055 68     Resp 06/16/20 1055 20     Temp 06/16/20 1055 97.9 F (36.6 C)     Temp Source 06/16/20 1055 Oral     SpO2 06/16/20 1055 94 %     Weight --      Height  --      Head Circumference --      Peak Flow --      Pain Score 06/16/20 1114 0     Pain Loc --      Pain Edu? --      Excl. in [place]? --    No data found.  Updated Vital Signs BP 131/70 (BP Location: Left Arm)   Pulse 68   Temp 97.9 F (36.6 C) (Oral)   Resp 20  SpO2 94%    Physical Exam Constitutional:      Appearance: He is well-developed.  Cardiovascular:     Rate and Rhythm: Normal rate.  Pulmonary:     Effort: Pulmonary effort is normal.  Musculoskeletal:     Lumbar back: No bony tenderness. Negative left straight leg raise test.     Left hip: Tenderness present. Normal range of motion. Normal strength.       Legs:     Comments: Posterior iliac tenderness on palpation; pain with hip flexion and with external rotation; no pain with abduction or adduction; no pain with straight leg raise or back pain; pain with transition from sit to lay and lay to sit noted; ambulatory without difficulty   Skin:    General: Skin is warm and dry.  Neurological:     Mental Status: He is alert and oriented to person, place, and time.      UC Treatments / Results  Labs (all labs ordered are listed, but only abnormal results are displayed) Labs Reviewed - No data to display  EKG   Radiology DG Hip Unilat With Pelvis 2-3 Views Left  Result Date: 06/16/2020 CLINICAL DATA:  Left hip pain for 1 week after jogging. EXAM: DG HIP (WITH OR WITHOUT PELVIS) 2-3V LEFT COMPARISON:  None. FINDINGS: There is no evidence of hip fracture or dislocation. There is no evidence of arthropathy or other focal bone abnormality. Surgical clips seen in the inferior pelvis likely from prior prostatectomy. IMPRESSION: Negative. Electronically Signed   By: Marlaine Hind [name]   On: 06/16/2020 11:47    Procedures Procedures (including critical care time)  Medications Ordered in UC Medications - No data to display  Initial Impression / Assessment and Plan / UC Course  I have reviewed the triage vital signs  and the nursing notes.  Pertinent labs & imaging results that were available during my care of the patient were reviewed by me and considered in my medical decision making (see chart for details).     Strain vs tendonitis. Pain management and expected course of rehab discussed. Follow up recommendations provided. Patient verbalized understanding and agreeable to plan.  Ambulatory out of clinic without difficulty.    Final Clinical Impressions(s) / UC Diagnoses   Final diagnoses:  Hip pain  Strain of left hip, initial encounter     Discharge Instructions     Your xray is normal today which is reassuring.  You may continue with tylenol and/or naproxen as needed for pain. If you take naproxen take with food.  See provided education.  Physical therapy may be helpful and you may need longer term treatment for rehab, so orthopedic follow up may be helpful for you. You  may also follow up with your PCP who can refer you as needed.  Activity as tolerated.     ED Prescriptions    Medication Sig Dispense Auth. Provider   naproxen (NAPROSYN) 375 MG tablet Take 1 tablet (375 mg total) by mouth 2 (two) times daily with a meal. 20 tablet Zigmund Gottron, NP     PDMP not reviewed this encounter.   Zigmund Gottron, NP 06/16/20 1228

## 2020-06-16 NOTE — ED Triage Notes (Signed)
Evaluated by Lanelle Bal, np

## 2020-06-16 NOTE — Discharge Instructions (Addendum)
Your xray is normal today which is reassuring.  You may continue with tylenol and/or naproxen as needed for pain. If you take naproxen take with food.  See provided education.  Physical therapy may be helpful and you may need longer term treatment for rehab, so orthopedic follow up may be helpful for you. You  may also follow up with your PCP who can refer you as needed.  Activity as tolerated.

## 2020-06-25 DIAGNOSIS — N1831 Chronic kidney disease, stage 3a: Secondary | ICD-10-CM | POA: Diagnosis not present

## 2020-06-25 DIAGNOSIS — I129 Hypertensive chronic kidney disease with stage 1 through stage 4 chronic kidney disease, or unspecified chronic kidney disease: Secondary | ICD-10-CM | POA: Diagnosis not present

## 2020-06-25 DIAGNOSIS — M25552 Pain in left hip: Secondary | ICD-10-CM | POA: Diagnosis not present

## 2020-07-23 DIAGNOSIS — E785 Hyperlipidemia, unspecified: Secondary | ICD-10-CM | POA: Diagnosis not present

## 2020-07-25 ENCOUNTER — Encounter: Payer: PPO | Admitting: Dermatology

## 2020-07-25 DIAGNOSIS — M25552 Pain in left hip: Secondary | ICD-10-CM | POA: Diagnosis not present

## 2020-07-25 DIAGNOSIS — I10 Essential (primary) hypertension: Secondary | ICD-10-CM | POA: Diagnosis not present

## 2020-07-25 DIAGNOSIS — E039 Hypothyroidism, unspecified: Secondary | ICD-10-CM | POA: Diagnosis not present

## 2020-07-25 DIAGNOSIS — N1831 Chronic kidney disease, stage 3a: Secondary | ICD-10-CM | POA: Diagnosis not present

## 2020-07-25 DIAGNOSIS — E440 Moderate protein-calorie malnutrition: Secondary | ICD-10-CM | POA: Diagnosis not present

## 2020-07-31 ENCOUNTER — Other Ambulatory Visit: Payer: Self-pay | Admitting: Internal Medicine

## 2020-07-31 DIAGNOSIS — M25552 Pain in left hip: Secondary | ICD-10-CM

## 2020-08-07 ENCOUNTER — Ambulatory Visit
Admission: RE | Admit: 2020-08-07 | Discharge: 2020-08-07 | Disposition: A | Discharge status: Home / Self Care | Payer: PPO | Source: Ambulatory Visit | Attending: Internal Medicine | Admitting: Internal Medicine

## 2020-08-07 DIAGNOSIS — Z8546 Personal history of malignant neoplasm of prostate: Secondary | ICD-10-CM | POA: Diagnosis not present

## 2020-08-07 DIAGNOSIS — S72052A Unspecified fracture of head of left femur, initial encounter for closed fracture: Secondary | ICD-10-CM | POA: Diagnosis not present

## 2020-08-07 DIAGNOSIS — S76012A Strain of muscle, fascia and tendon of left hip, initial encounter: Secondary | ICD-10-CM | POA: Diagnosis not present

## 2020-08-07 DIAGNOSIS — M25552 Pain in left hip: Secondary | ICD-10-CM

## 2020-08-07 DIAGNOSIS — M1612 Unilateral primary osteoarthritis, left hip: Secondary | ICD-10-CM | POA: Diagnosis not present

## 2020-08-08 ENCOUNTER — Encounter: Payer: Self-pay | Admitting: Physician Assistant

## 2020-08-08 ENCOUNTER — Ambulatory Visit: Payer: PPO | Admitting: Physician Assistant

## 2020-08-08 DIAGNOSIS — M25559 Pain in unspecified hip: Secondary | ICD-10-CM

## 2020-08-08 DIAGNOSIS — M1612 Unilateral primary osteoarthritis, left hip: Secondary | ICD-10-CM | POA: Diagnosis not present

## 2020-08-08 NOTE — Progress Notes (Signed)
Office Visit Note   Patient: Martin Curry           Date of Birth: 04/30/1932           MRN: [id number] Visit Date: 08/08/2020              Requested by: Leanna Battles, MD [address] [place],  [place] [address] PCP: Leanna Battles, MD   Assessment & Plan: Visit Diagnoses:  1. Hip pain   2. Primary osteoarthritis of left hip     Plan: We will have him walk with a cane in his right hand to offload the left hip.  No high impact activity on the left hip.  Obtain an CT of his pelvis with contrast to evaluate lesion of the iliac bone and sacrum also to better evaluate the edema involving the left femoral head and neck.  He will follow-up with Dr. Ninfa Linden after the CT, to go over the results and discuss further treatment.  Questions encouraged and answered at length.  Follow-Up Instructions: Return After CT scan.   Orders:  No orders of the defined types were placed in this encounter.  No orders of the defined types were placed in this encounter.     Procedures: No procedures performed   Clinical Data: No additional findings.   Subjective: Chief Complaint  Patient presents with   Left Hip - Pain    HPI [name] is a pleasant 87 year old male were seen for the first time for the left hip pain.  He states he has had pain since Easter.  He states he was cooking when his wife ran her IT trainer wheelchair into the [employer].  And states that he jumped to make sure that she was fine.  Had no real injury to the hip but has had pain in the hip since that time.  States with certain movements his pain can be 10 out of 10 pain.  He is not using any type of assistive device to ambulate.  He has pain with getting in and out of car putting on his shoes.  He states he has been using a heating pad and this seems to make the hip pain better.  Takes Tylenol arthritis for the pain.  No numbness tingling down the left leg. Patient saw his primary care physician.  Obtained an MRI  of his left hip due to the chronic pain.  MRI images are reviewed with the patient.  MRI left hip without contrast dated 08/08/2020 showed extensive edema involving the left femoral head into the femoral neck.  Findings consistent with subchondral insufficiency fracture.  Moderate hip arthritis seen.  Left iliac bone posterior superior aspect with a bony indeterminate lesion measuring 1.3 x 1.1 x 1.2 cm.  Indeterminate lesion of the sacrum at the level of S5 measuring 2.2 cm x 1.2 cm x 1.6 cm. Patient with a history of basal cell carcinoma , parotid cancer and prostate cancer. Review of Systems Denies any recent fevers or chills.  Objective: Vital Signs: There were no vitals taken for this visit.  Physical Exam Constitutional:      Appearance: He is not ill-appearing or diaphoretic.  Pulmonary:     Effort: Pulmonary effort is normal.  Neurological:     Mental Status: He is alert and oriented to person, place, and time.  Psychiatric:        Behavior: Behavior normal.    Ortho Exam Physical exam: Left hip he has pain and guarding with any  attempts of internal and external rotation.  He is able to bear full weight on the left hip.  Leg calf supple nontender.  Good range of motion right hip without pain. Specialty Comments:  No specialty comments available.  Imaging: No results found.   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   Nodular basal cell carcinoma (BCC) 07/16/2015   left scalp-CX35FU   Polycythemia    Prostate cancer ([place]) 1990's   total prostatectomy and adjuvant radiation. Last PSA was 0.02 in 09/2011   SCC (squamous cell carcinoma) 04/03/2009   forehead-txpbx   SCC (squamous cell carcinoma) 07/16/2015   left front scalp   SCC (squamous cell carcinoma) 07/16/2015   left front scalp-Cx35FU   SCCA (squamous cell carcinoma) of skin 12/20/2019   Right forehead (in situ)   Superficial basal cell carcinoma (BCC) 11/21/2012   behind left ear-CX35FU, midback-CX35FU, right forehead-CX35FU-exc   Superficial basal cell carcinoma (BCC) 11/02/2017   left post neck-CX35FU   Superficial nodular basal cell carcinoma (BCC) 12/20/2019   Left temporal scalp    Family History  Problem Relation Age of Onset   Uterine cancer Mother 72   Lung cancer Father 9   Prostate cancer Brother    Prostate cancer Brother     Past Surgical History:  Procedure Laterality Date   ADJACENT TISSUE TRANSFER/TISSUE REARRANGEMENT N/A 05/22/2019   Procedure: COMPLEX CLOSURE OF NECK WOUND;  Surgeon: Cindra Presume, MD;  Location: [place];  Service: Plastics;  Laterality: N/A;   ALLOGRAFT APPLICATION N/A 5/32/9924   Procedure: POSSIBLE FACIAL NERVE RECONSTRUCTION WITH NERVE ALLOGRAFT VS AUTOGRAFT;  Surgeon: Cindra Presume, MD;  Location: [facility];  Service: Plastics;  Laterality: N/A;   BLADDER SURGERY     due to prostatectomy.    CATARACT EXTRACTION     right   COLONOSCOPY     neg in the past; due in 2014 with [place] Dr. Sharlett Iles   EYE SURGERY     50 years  ago.    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

## 2020-08-09 NOTE — Addendum Note (Signed)
Addended by: Robyne Peers on: 08/09/2020 08:22 AM   Modules accepted: Orders

## 2020-08-15 ENCOUNTER — Encounter: Payer: Self-pay | Admitting: Dermatology

## 2020-08-15 ENCOUNTER — Other Ambulatory Visit: Payer: Self-pay

## 2020-08-15 ENCOUNTER — Other Ambulatory Visit: Payer: Self-pay | Admitting: Dermatology

## 2020-08-15 ENCOUNTER — Ambulatory Visit (INDEPENDENT_AMBULATORY_CARE_PROVIDER_SITE_OTHER): Payer: PPO | Admitting: Dermatology

## 2020-08-15 DIAGNOSIS — C44319 Basal cell carcinoma of skin of other parts of face: Secondary | ICD-10-CM | POA: Diagnosis not present

## 2020-08-15 DIAGNOSIS — C44212 Basal cell carcinoma of skin of right ear and external auricular canal: Secondary | ICD-10-CM | POA: Diagnosis not present

## 2020-08-15 NOTE — Patient Instructions (Signed)

## 2020-08-20 ENCOUNTER — Encounter: Payer: Self-pay | Admitting: Dermatology

## 2020-08-20 NOTE — Progress Notes (Signed)
Follow-Up Visit   Subjective  Martin Curry is a 87 y.o. male who presents for the following: Procedure (Skin , right preauricular area/BASAL CELL CARCINOMA, SUPERFICIAL AND NODULAR PATTERNS/2. Skin , mid parietal scalp/SQUAMOUS CELL CARCINOMA IN SITU).  Biopsy-proven BCC in front of right ear Location:  Duration:  Quality:  Associated Signs/Symptoms: Modifying Factors:  Severity:  Timing: Context:   Objective  Well appearing patient in no apparent distress; mood and affect are within normal limits. Right Preauricular Area Lesion identified by Dr.Shamicka Inga and nurse in room.      A focused examination was performed including head and neck.. Relevant physical exam findings are noted in the Assessment and Plan.   Assessment & Plan    Basal cell carcinoma (BCC) of skin of right ear Right Preauricular Area  Destruction of lesion Complexity: simple   Destruction method: electrodesiccation and curettage   Informed consent: discussed and consent obtained   Timeout:  patient name, date of birth, surgical site, and procedure verified Anesthesia: the lesion was anesthetized in a standard fashion   Anesthetic:  1% lidocaine w/ epinephrine 1-100,000 local infiltration Curettage performed in three different directions: Yes   Electrodesiccation performed over the curetted area: Yes   Curettage cycles:  3 Margin per side (cm):  0.1 Final wound size (cm):  2 Hemostasis achieved with:  ferric subsulfate and electrodesiccation Outcome: patient tolerated procedure well with no complications   Post-procedure details: wound care instructions given   Additional details:  Wound innoculated with 5 fluorouracil solution.  Skin excision  Lesion length (cm):  1.3 Lesion width (cm):  1.3 Margin per side (cm):  0.1 Total excision diameter (cm):  1.5 Informed consent: discussed and consent obtained   Timeout: patient name, date of birth, surgical site, and procedure verified    Anesthesia: the lesion was anesthetized in a standard fashion   Anesthetic:  1% lidocaine w/ epinephrine 1-100,000 local infiltration Instrument used: #15 blade   Hemostasis achieved with: pressure and electrodesiccation   Outcome: patient tolerated procedure well with no complications   Post-procedure details: sterile dressing applied and wound care instructions given   Dressing type: bandage, petrolatum and pressure dressing    Skin repair Complexity:  Intermediate Final length (cm):  2 Informed consent: discussed and consent obtained   Timeout: patient name, date of birth, surgical site, and procedure verified   Procedure prep:  Patient was prepped and draped in usual sterile fashion (non sterile) Prep type:  Chlorhexidine Anesthesia: the lesion was anesthetized in a standard fashion   Reason for type of repair: reduce tension to allow closure and reduce the risk of dehiscence, infection, and necrosis   Undermining: edges undermined   Subcutaneous layers (deep stitches):  Suture size:  5-0 Suture type: Vicryl (polyglactin 910)   Fine/surface layer approximation (top stitches):  Suture size:  5-0 Suture type: nylon   Suture type comment:  Nylon Stitches: simple running   Suture removal (days):  10 Hemostasis achieved with: suture, pressure and electrodesiccation Outcome: patient tolerated procedure well with no complications   Post-procedure details: sterile dressing applied and wound care instructions given   Post-procedure details comment:  Non sterile pressure  Dressing type: bandage and petrolatum   Additional details:  5-0 Vicryl x 1 5-0 Ethilon x 5  Specimen 1 - Surgical pathology Differential Diagnosis: R/O BCC  - curet, cautery and excision [id number] Check Margins: Yes - inferior margin stained  Curettage showed this to be a deep more than wide lesion so  the base and margins were cauterized, rate curetted, and narrow margin excision with layered closure  performed.      I, Lavonna Monarch, MD, have reviewed all documentation for this visit.  The documentation on 08/20/20 for the exam, diagnosis, procedures, and orders are all accurate and complete.

## 2020-08-21 ENCOUNTER — Ambulatory Visit: Payer: PPO | Admitting: Dermatology

## 2020-08-27 ENCOUNTER — Ambulatory Visit (INDEPENDENT_AMBULATORY_CARE_PROVIDER_SITE_OTHER): Payer: PPO | Admitting: *Deleted

## 2020-08-27 ENCOUNTER — Other Ambulatory Visit: Payer: Self-pay

## 2020-08-27 DIAGNOSIS — Z4802 Encounter for removal of sutures: Secondary | ICD-10-CM

## 2020-08-27 NOTE — Progress Notes (Signed)
Here for NTS suture removal. No signs or symptoms of infection. Path to patient. 

## 2020-08-30 ENCOUNTER — Other Ambulatory Visit: Payer: Self-pay

## 2020-08-30 ENCOUNTER — Ambulatory Visit: Payer: PPO | Attending: Internal Medicine

## 2020-08-30 ENCOUNTER — Ambulatory Visit
Admission: RE | Admit: 2020-08-30 | Discharge: 2020-08-30 | Disposition: A | Discharge status: Home / Self Care | Payer: PPO | Source: Ambulatory Visit | Attending: Physician Assistant | Admitting: Physician Assistant

## 2020-08-30 DIAGNOSIS — M7989 Other specified soft tissue disorders: Secondary | ICD-10-CM | POA: Diagnosis not present

## 2020-08-30 DIAGNOSIS — M25559 Pain in unspecified hip: Secondary | ICD-10-CM

## 2020-08-30 DIAGNOSIS — M1612 Unilateral primary osteoarthritis, left hip: Secondary | ICD-10-CM

## 2020-08-30 DIAGNOSIS — Z8546 Personal history of malignant neoplasm of prostate: Secondary | ICD-10-CM | POA: Diagnosis not present

## 2020-08-30 DIAGNOSIS — K4090 Unilateral inguinal hernia, without obstruction or gangrene, not specified as recurrent: Secondary | ICD-10-CM | POA: Diagnosis not present

## 2020-08-30 DIAGNOSIS — M25572 Pain in left ankle and joints of left foot: Secondary | ICD-10-CM | POA: Diagnosis not present

## 2020-08-30 MED ORDER — IOPAMIDOL (ISOVUE-300) INJECTION 61%
100.0000 mL | Freq: Once | INTRAVENOUS | Status: AC | PRN
  Administered 2020-08-30: 100 mL via INTRAVENOUS

## 2020-09-03 ENCOUNTER — Other Ambulatory Visit: Payer: Self-pay

## 2020-09-03 DIAGNOSIS — M25559 Pain in unspecified hip: Secondary | ICD-10-CM

## 2020-09-03 NOTE — Progress Notes (Signed)
Ok done

## 2020-09-04 ENCOUNTER — Telehealth: Payer: Self-pay | Admitting: Dermatology

## 2020-09-04 NOTE — Telephone Encounter (Signed)
Patient is calling for pathology results from last visit with Stuart Tafeen, MD 

## 2020-09-04 NOTE — Telephone Encounter (Signed)
Path to patient and free margins

## 2020-09-18 ENCOUNTER — Ambulatory Visit
Admission: RE | Admit: 2020-09-18 | Discharge: 2020-09-18 | Disposition: A | Discharge status: Home / Self Care | Payer: PPO | Source: Ambulatory Visit | Attending: Physician Assistant | Admitting: Physician Assistant

## 2020-09-18 DIAGNOSIS — M25559 Pain in unspecified hip: Secondary | ICD-10-CM

## 2020-09-18 DIAGNOSIS — M160 Bilateral primary osteoarthritis of hip: Secondary | ICD-10-CM | POA: Diagnosis not present

## 2020-09-18 DIAGNOSIS — S72052A Unspecified fracture of head of left femur, initial encounter for closed fracture: Secondary | ICD-10-CM | POA: Diagnosis not present

## 2020-09-18 DIAGNOSIS — Z8546 Personal history of malignant neoplasm of prostate: Secondary | ICD-10-CM | POA: Diagnosis not present

## 2020-09-18 DIAGNOSIS — K5730 Diverticulosis of large intestine without perforation or abscess without bleeding: Secondary | ICD-10-CM | POA: Diagnosis not present

## 2020-09-18 MED ORDER — GADOBENATE DIMEGLUMINE 529 MG/ML IV SOLN
18.0000 mL | Freq: Once | INTRAVENOUS | Status: AC | PRN
  Administered 2020-09-18: 18 mL via INTRAVENOUS

## 2020-09-23 ENCOUNTER — Encounter: Payer: Self-pay | Admitting: Dermatology

## 2020-09-23 NOTE — Progress Notes (Signed)
   Follow-Up Visit   Subjective  Martin Curry is a 87 y.o. male who presents for the following: Annual Exam (RIGHT FOREHEAD X MONTHS NON HEALING).  Annual skin examination, new lesions forehead nonhealing. Location:  Duration:  Quality:  Associated Signs/Symptoms: Modifying Factors:  Severity:  Timing: Context:   Objective  Well appearing patient in no apparent distress; mood and affect are within normal limits. Chest - Medial (Center) Waist up skin examination: No atypical pigmented lesions.  No sign of recurrence of metastatic basal cell right parotid.  No adenopathy.  Possible nonmelanoma skin cancers on forehead will be biopsied.  Head - Anterior (Face) SEE HISTORY; no recurrence.       Mid Parietal Scalp SEE HISTORY  Left Temporal Scalp Pink pearly 8 mm papule     Right Forehead Pink pearly crusted 6 mm papule     Left Upper Back Brown flattopped textured papules    All skin waist up examined.   Assessment & Plan    Screening exam for skin cancer Chest - Medial [facility])  Annual skin examination.  History of basal cell cancer Head - Anterior (Face)  Annual skin examination  History of squamous cell carcinoma Mid Parietal Scalp  Neoplasm of uncertain behavior of skin (2) Left Temporal Scalp  Skin / nail biopsy Type of biopsy: tangential   Informed consent: discussed and consent obtained   Timeout: patient name, date of birth, surgical site, and procedure verified   Procedure prep:  Patient was prepped and draped in usual sterile fashion (Non sterile) Prep type:  Chlorhexidine Anesthesia: the lesion was anesthetized in a standard fashion   Anesthetic:  1% lidocaine w/ epinephrine 1-100,000 local infiltration Instrument used: flexible razor blade   Outcome: patient tolerated procedure well   Post-procedure details: wound care instructions given    Specimen 1 - Surgical pathology Differential Diagnosis: bcc vs scc Check Margins:  No  Right Forehead  Skin / nail biopsy Type of biopsy: tangential   Informed consent: discussed and consent obtained   Timeout: patient name, date of birth, surgical site, and procedure verified   Procedure prep:  Patient was prepped and draped in usual sterile fashion (Non sterile) Prep type:  Chlorhexidine Anesthesia: the lesion was anesthetized in a standard fashion   Anesthetic:  1% lidocaine w/ epinephrine 1-100,000 local infiltration Instrument used: flexible razor blade   Outcome: patient tolerated procedure well   Post-procedure details: wound care instructions given    Specimen 2 - Surgical pathology Differential Diagnosis: bcc vs scc Check Margins: No  Seborrheic keratosis Left Upper Back  Okay to leave if stable      I, Lavonna Monarch, MD, have reviewed all documentation for this visit.  The documentation on 09/23/20 for the exam, diagnosis, procedures, and orders are all accurate and complete.

## 2020-09-24 ENCOUNTER — Other Ambulatory Visit: Payer: Self-pay

## 2020-09-24 DIAGNOSIS — M25559 Pain in unspecified hip: Secondary | ICD-10-CM

## 2020-09-24 DIAGNOSIS — M1612 Unilateral primary osteoarthritis, left hip: Secondary | ICD-10-CM

## 2020-09-26 ENCOUNTER — Ambulatory Visit: Payer: PPO | Admitting: Physician Assistant

## 2020-09-26 ENCOUNTER — Telehealth: Payer: Self-pay | Admitting: Hematology and Oncology

## 2020-09-26 NOTE — Telephone Encounter (Signed)
Received a new pt referral from [facility] for bone lesions. [name] returned my call and has been scheduled to see Dr. Lorenso Courier on 8/5 at [place].

## 2020-09-27 ENCOUNTER — Other Ambulatory Visit: Payer: Self-pay

## 2020-09-27 ENCOUNTER — Inpatient Hospital Stay: Payer: PPO | Attending: Hematology and Oncology | Admitting: Hematology and Oncology

## 2020-09-27 ENCOUNTER — Inpatient Hospital Stay: Payer: PPO

## 2020-09-27 VITALS — BP 118/66 | HR 66 | Temp 98.1°F | Resp 17 | Ht 69.0 in | Wt 181.4 lb

## 2020-09-27 DIAGNOSIS — Z8546 Personal history of malignant neoplasm of prostate: Secondary | ICD-10-CM | POA: Insufficient documentation

## 2020-09-27 DIAGNOSIS — Z79899 Other long term (current) drug therapy: Secondary | ICD-10-CM | POA: Diagnosis not present

## 2020-09-27 DIAGNOSIS — M859 Disorder of bone density and structure, unspecified: Secondary | ICD-10-CM | POA: Insufficient documentation

## 2020-09-27 DIAGNOSIS — M899 Disorder of bone, unspecified: Secondary | ICD-10-CM

## 2020-09-27 DIAGNOSIS — Z85828 Personal history of other malignant neoplasm of skin: Secondary | ICD-10-CM | POA: Insufficient documentation

## 2020-09-27 LAB — CBC WITH DIFFERENTIAL (CANCER CENTER ONLY)
Abs Immature Granulocytes: 0.02 10*3/uL (ref 0.00–0.07)
Basophils Absolute: 0 10*3/uL (ref 0.0–0.1)
Basophils Relative: 1 %
Eosinophils Absolute: 0.2 10*3/uL (ref 0.0–0.5)
Eosinophils Relative: 3 %
HCT: 45.3 % (ref 39.0–52.0)
Hemoglobin: 15.8 g/dL (ref 13.0–17.0)
Immature Granulocytes: 0 %
Lymphocytes Relative: 12 %
Lymphs Abs: 0.8 10*3/uL (ref 0.7–4.0)
MCH: 33.2 pg (ref 26.0–34.0)
MCHC: 34.9 g/dL (ref 30.0–36.0)
MCV: 95.2 fL (ref 80.0–100.0)
Monocytes Absolute: 0.6 10*3/uL (ref 0.1–1.0)
Monocytes Relative: 9 %
Neutro Abs: 5.2 10*3/uL (ref 1.7–7.7)
Neutrophils Relative %: 75 %
Platelet Count: 217 10*3/uL (ref 150–400)
RBC: 4.76 MIL/uL (ref 4.22–5.81)
RDW: 13.2 % (ref 11.5–15.5)
WBC Count: 6.9 10*3/uL (ref 4.0–10.5)
nRBC: 0 % (ref 0.0–0.2)

## 2020-09-27 LAB — CMP (CANCER CENTER ONLY)
ALT: 33 U/L (ref 0–44)
AST: 27 U/L (ref 15–41)
Albumin: 4.3 g/dL (ref 3.5–5.0)
Alkaline Phosphatase: 80 U/L (ref 38–126)
Anion gap: 9 (ref 5–15)
BUN: 21 mg/dL (ref 8–23)
CO2: 27 mmol/L (ref 22–32)
Calcium: 10.2 mg/dL (ref 8.9–10.3)
Chloride: 106 mmol/L (ref 98–111)
Creatinine: 1.37 mg/dL — ABNORMAL HIGH (ref 0.61–1.24)
GFR, Estimated: 51 mL/min — ABNORMAL LOW (ref >60)
Glucose, Bld: 104 mg/dL — ABNORMAL HIGH (ref 70–99)
Potassium: 4.4 mmol/L (ref 3.5–5.1)
Sodium: 142 mmol/L (ref 135–145)
Total Bilirubin: 0.8 mg/dL (ref 0.3–1.2)
Total Protein: 7.1 g/dL (ref 6.5–8.1)

## 2020-09-27 LAB — LACTATE DEHYDROGENASE: LDH: 179 U/L (ref 98–192)

## 2020-09-27 NOTE — Progress Notes (Signed)
[place] [phone]   Fax:([phone]) [place] NOTE  Patient Care Team: Leanna Battles, MD as PCP - General (Internal Medicine) Leanna Battles, MD as Consulting Physician (Internal Medicine) Nobie Putnam, MD (Hematology and Oncology) Irine Seal, MD as Attending Physician (Urology) Sable Feil, MD as Attending Physician (Gastroenterology) Lavonna Monarch, MD as Consulting Physician (Dermatology) Eppie Gibson, MD as Attending Physician (Radiation Oncology) Leota Sauers, RN (Inactive) as Oncology Nurse Navigator Malmfelt, Stephani Police, RN as Oncology Nurse Navigator (Oncology)  Hematological/Oncological History # Bone Lesion 08/30/2020: CT Pelvis W contrast showed 1.2 cm lucent lesion over the superior left iliac bone adjacent the sacroiliac joint corresponding to signal abnormality seen on MRI 09/19/2020: MRI pelvis WWO showed Marrow replacing lesions within the left iliac bone and sacrum. Additionally there was noted to be a new enhancing lesion at the tip of the right greater trochanter measuring up to 1.7 cm also suspicious for metastatic disease. 09/27/2020: establish care with Dr. Lorenso Courier  CHIEF COMPLAINTS/PURPOSE OF CONSULTATION:  "Bone Lesion "  HISTORY OF PRESENTING ILLNESS:  Martin Curry 87 y.o. male with medical history significant for SCC and basal cell carcinoma of the skin followed by dermatology, HLD, prostate cancer (s/p total prostatectomy and adjuvant radiation in 09/2011) who presents for evaluation of new bone lesions in the pelvis.    On review of the previous records [name] underwent a CT scan of the pelvis on 08/30/2020 which showed a 1.2 cm lucent lesion over the superior left iliac bone.  There was concern that this may represent metastatic lesion and an MRI of the pelvis was recommended.  MRI was performed on 09/19/2020 which showed marrow replacing lesions within the left iliac bone and sacrum.  Additionally there  was noted to be a new enhancing lesion at the tip of the right greater trochanter measuring up to 1.7 cm.  Due to concern for this apparent metastatic disease the patient was referred to oncology for further evaluation management.  On exam today [name] reports that his troubles began when his wife had an accident in her motorized wheelchair.  While descending to assist with this he injured his left hip which caused him considerable pain.  That is what prompted the imaging which initially found these lesions.  He notes today his hip pain is improved but "not perfect".  He reports he does have history of prostate cancer status post resection in 2013.  He does have some issues with bladder control but otherwise has no residual side effects from the treatment.  He notes that he is a never smoker and never drinks alcohol.  He previously worked for [employer].  On further discussion he has no family history remarkable for cancer.  He notes that his weight is up to 181 pounds from 169 pounds.  He otherwise is not been having issues with fevers, chills, sweats, nausea, vomiting or diarrhea.  A full 10 point ROS is listed below.  MEDICAL HISTORY:  Past Medical History:  Diagnosis Date   Atypical nevus 09/07/2002   left abdomen-slight   BCC (basal cell carcinoma of skin) 07/19/2014   left sideburn- +margin-exc.   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   Nodular basal cell carcinoma (BCC) 07/16/2015   left scalp-CX35FU   Polycythemia    Prostate cancer ([place]) 1990's   total prostatectomy and adjuvant radiation. Last PSA was 0.02 in 09/2011   SCC (squamous cell carcinoma) 04/03/2009   forehead-txpbx   SCC (squamous cell carcinoma) 07/16/2015   left front scalp   SCC (squamous cell carcinoma) 07/16/2015   left front scalp-Cx35FU   SCCA (squamous cell carcinoma) of skin 12/20/2019   Right forehead (in situ)   SCCA (squamous cell carcinoma) of skin 05/27/2020   Mid Parietal Scalp (in situ)   Superficial basal cell carcinoma (BCC) 11/21/2012   behind left ear-CX35FU, midback-CX35FU, right forehead-CX35FU-exc   Superficial basal cell carcinoma (BCC) 11/02/2017   left post neck-CX35FU   Superficial nodular basal cell carcinoma (BCC) 12/20/2019   Left temporal scalp   Superficial nodular basal cell carcinoma (BCC) 05/27/2020   Right Preauricular area (curet and 5FU)    SURGICAL HISTORY: Past Surgical History:  Procedure Laterality Date   ADJACENT TISSUE TRANSFER/TISSUE REARRANGEMENT N/A 05/22/2019   Procedure: COMPLEX CLOSURE OF NECK WOUND;  Surgeon: Cindra Presume, MD;  Location: [facility];  Service: Plastics;  Laterality: N/A;   ALLOGRAFT APPLICATION N/A 4/97/0263   Procedure: POSSIBLE FACIAL NERVE RECONSTRUCTION WITH NERVE ALLOGRAFT VS AUTOGRAFT;  Surgeon: Cindra Presume, MD;  Location: [place];  Service: Plastics;  Laterality: N/A;   BLADDER SURGERY     due to prostatectomy.    CATARACT EXTRACTION     right   COLONOSCOPY     neg in the past; due in 2014 with [place] Dr. Sharlett Iles   EYE SURGERY     50 years ago.    HERNIA REPAIR     MOHS SURGERY     NECK DISSECTION  05/22/2019    NECK DISSECTION   PAROTIDECTOMY  05/22/2019   PAROTIDECTOMY with facial nerve dissection    PAROTIDECTOMY Right 05/22/2019   Procedure: PAROTIDECTOMY;  Surgeon: Izora Gala, MD;  Location: [place];  Service: ENT;   Laterality: Right;   PROSTATE SURGERY     RADICAL NECK DISSECTION Right 05/22/2019   Procedure: RADICAL NECK DISSECTION;  Surgeon: Izora Gala, MD;  Location: [place];  Service: ENT;  Laterality: Right;   TONSILLECTOMY      SOCIAL HISTORY: Social History   Socioeconomic History   Marital status: Married    Spouse name: Not on file   Number of children: 3   Years of education: Not on file   Highest education level: Not on file  Occupational History    Comment: retired IT consultant; 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  Tobacco Use   Smoking status: Never   Smokeless tobacco: Never  Vaping Use   Vaping Use: Never used  Substance and Sexual Activity   Alcohol use: No   Drug use: No   Sexual activity: Not on file  Other Topics Concern   Not on file  Social History Narrative   Not on file   Social Determinants of Health   Financial Resource Strain: Not on file  Food Insecurity: Not on file  Transportation Needs: Not  on file  Physical Activity: Not on file  Stress: Not on file  Social Connections: Not on file  Intimate Partner Violence: Not on file    FAMILY HISTORY: Family History  Problem Relation Age of Onset   Uterine cancer Mother 14   Lung cancer Father 84   Prostate cancer Brother    Prostate cancer Brother     ALLERGIES:  is allergic to penicillins, latex, and tape.  MEDICATIONS:  Current Outpatient Medications  Medication Sig Dispense Refill   famotidine-calcium carbonate-magnesium hydroxide (PEPCID COMPLETE) [phone] MG chewable tablet Take one tablet PRN     amLODipine (NORVASC) 5 MG tablet Take 5 mg by mouth daily.      aspirin 81 MG tablet Take 81 mg by mouth daily.     benazepril (LOTENSIN) 10 MG tablet Take 10 mg by mouth daily.     COVID-19 mRNA Vac-TriS, Pfizer, (PFIZER-BIONT COVID-19 VAC-TRIS) SUSP injection Inject into the muscle. 0.3 mL 0   levothyroxine (SYNTHROID) 25 MCG tablet Take 25 mcg by mouth daily before breakfast.     Multiple Vitamin  (MULTIVITAMIN) tablet Take 1 tablet by mouth daily.     potassium citrate (UROCIT-K) 10 MEQ (1080 MG) SR tablet Take 10 mEq by mouth 3 (three) times daily with meals.     simvastatin (ZOCOR) 20 MG tablet Take 20 mg by mouth every evening.     No current facility-administered medications for this visit.    REVIEW OF SYSTEMS:   Constitutional: ( - ) fevers, ( - )  chills , ( - ) night sweats Eyes: ( - ) blurriness of vision, ( - ) double vision, ( - ) watery eyes Ears, nose, mouth, throat, and face: ( - ) mucositis, ( - ) sore throat Respiratory: ( - ) cough, ( - ) dyspnea, ( - ) wheezes Cardiovascular: ( - ) palpitation, ( - ) chest discomfort, ( - ) lower extremity swelling Gastrointestinal:  ( - ) nausea, ( - ) heartburn, ( - ) change in bowel habits Skin: ( - ) abnormal skin rashes Lymphatics: ( - ) new lymphadenopathy, ( - ) easy bruising Neurological: ( - ) numbness, ( - ) tingling, ( - ) new weaknesses Behavioral/Psych: ( - ) mood change, ( - ) new changes  All other systems were reviewed with the patient and are negative.  PHYSICAL EXAMINATION: ECOG PERFORMANCE STATUS: 1 - Symptomatic but completely ambulatory  Vitals:   09/27/20 0845  BP: 118/66  Pulse: 66  Resp: 17  Temp: 98.1 F (36.7 C)  SpO2: 99%   Filed Weights   09/27/20 0845  Weight: 181 lb 6.4 oz (82.3 kg)    GENERAL: well appearing elderly Caucasian male in NAD  SKIN: skin color, texture, turgor are normal, no rashes or significant lesions EYES: conjunctiva are pink and non-injected, sclera clear LUNGS: clear to auscultation and percussion with normal breathing effort HEART: regular rate & rhythm and no murmurs and no lower extremity edema Musculoskeletal: no cyanosis of digits and no clubbing  PSYCH: alert & oriented x 3, fluent speech NEURO: no focal motor/sensory deficits  LABORATORY DATA:  I have reviewed the data as listed CBC Latest Ref Rng & Units 09/27/2020 05/16/2020 09/27/2019  WBC 4.0 - 10.5 K/uL  6.9 5.9 7.5  Hemoglobin 13.0 - 17.0 g/dL 15.8 16.3 15.6  Hematocrit 39.0 - 52.0 % 45.3 48.2 47.1  Platelets 150 - 400 K/uL 217 222 211    CMP Latest Ref Rng & Units 09/27/2020  05/16/2020 02/01/2020  Glucose 70 - 99 mg/dL 104(H) 100(H) -  BUN 8 - 23 mg/dL _0 Creatinine 0.61 - 1.24 mg/dL 1.37(H) 1.40(H) 1.25(H)  Sodium 135 - 145 mmol/L 142 143 -  Potassium 3.5 - 5.1 mmol/L 4.4 5.0 -  Chloride 98 - 111 mmol/L 106 107 -  CO2 22 - 32 mmol/L 27 30 -  Calcium 8.9 - 10.3 mg/dL 10.2 9.9 -  Total Protein 6.5 - 8.1 g/dL 7.1 6.9 -  Total Bilirubin 0.3 - 1.2 mg/dL 0.8 0.7 -  Alkaline Phos 38 - 126 U/L 80 75 -  AST 15 - 41 U/L 27 26 -  ALT 0 - 44 U/L 33 27 -    RADIOGRAPHIC STUDIES: I have personally reviewed the radiological images as listed and agreed with the findings in the report: Lesions in the left hip and sacrum, unclear etiology. MR PELVIS W WO CONTRAST  Result Date: 09/19/2020 CLINICAL DATA:  Hip pain.  History of prostate cancer EXAM: MRI PELVIS WITHOUT AND WITH CONTRAST TECHNIQUE: Multiplanar multisequence MR imaging of the pelvis was performed both before and after administration of intravenous contrast. CONTRAST:  67m MULTIHANCE GADOBENATE DIMEGLUMINE 529 MG/ML IV SOLN COMPARISON:  MRI 08/07/2020, CT 08/30/2020 FINDINGS: Bones/Joint/Cartilage Redemonstrated subchondral fracture along the posterior aspect of the left femoral head with mild adjacent bone marrow edema (series 7, image 32), similar to prior. No new fracture. No dislocation. No femoral head avascular necrosis. Previously seen T2 hyperintense lesion within the posterior left iliac bone measuring approximately 14 x 10 mm enhances following the administration of gadolinium contrast (series 7 and 9, image 8). No appreciable change in size from prior. Additional T2 hyperintense, T2 hypointense lesion within the sacrum at the S3 and S4 levels measures approximately 2.8 x 1.2 x 2.7 cm and intensely enhances on postcontrast  sequences (series 9, images 18-21; series 11, image 35). This lesion appears to have increased in size compared to the previous MRI (previously measured approximately 2.2 x 1.2 x 1.6 cm). There is a new T2 hyperintense enhancing lesion at the tip of the right greater trochanter measuring approximately 1.7 x 1.3 x 1.6 cm (series 7, images 33-34). Similar degree of arthropathy of the bilateral hips. No new fractures. Ligaments Grossly intact. Muscles and Tendons No new or acute musculotendinous findings. Mild edema within the deep portion of the left iliacus muscle is similar to prior and likely reflects a muscle strain. Soft tissues No abnormal enhancement within the soft tissues. Extensive sigmoid diverticulosis. Bilateral hydroceles. IMPRESSION: 1. Marrow replacing lesions within the left iliac bone and sacrum demonstrate enhancement on post-contrast sequences and are suspicious for metastatic disease. The sacral lesion has increased in size in comparison to the previous MRI. 2. There is a new enhancing lesion at the tip of the right greater trochanter measuring up to 1.7 cm also suspicious for metastatic disease. 3. Unchanged appearance of subchondral fracture along the posterior aspect of the left femoral head with mild adjacent bone marrow edema. 4. Extensive sigmoid diverticulosis. Electronically Signed   By: NDavina PokeD.O.   On: 09/19/2020 13:22    ASSESSMENT & PLAN Martin LOVELAND89y.o. male with medical history significant for SCC and basal cell carcinoma of the skin followed by dermatology, HLD, prostate cancer (s/p total prostatectomy and adjuvant radiation in 09/2011) who presents for evaluation of new bone lesions in the pelvis.    After review of the labs, review of the records, and discussion with the patient  the patients findings are most consistent with new bone lesions concerning for metastatic disease.  These not appear to be lytic lesions and multiple myeloma is less likely, however  we will rule this out with blood work.  Additionally the patient has a history of prostate cancer and therefore checking his PSA will be reasonable in order to determine if this is recurrent metastatic prostate disease.  Otherwise we may be dealing with a primary of unclear etiology.  As such I would recommend that we perform a CT chest abdomen pelvis as well as nuclear medicine bone scan to further evaluate these lesions.  In the event there are no other lesions visible we would need to consider direct biopsy of 1 of these bone lesions.  # Bone Lesion -- Patient started on CT pelvis and MRI pelvis most consistent with metastatic disease. --Today we will order a nuclear medicine bone scan and CT chest abdomen pelvis --Additional work-up to include multiple myeloma labs with SPEP and serum free light chains --Given his prior history of prostate cancer we will order PSA --If no clear etiology can be found from the above studies we need to pursue a biopsy.  CT chest abdomen pelvis would help narrow down possible biopsy sites. --Return to clinic pending the results of the above studies.  Orders Placed This Encounter  Procedures   NM Bone Scan Whole Body    Standing Status:   Future    Standing Expiration Date:   09/27/2021    Order Specific Question:   If indicated for the ordered procedure, I authorize the administration of a radiopharmaceutical per Radiology protocol    Answer:   Yes    Order Specific Question:   Preferred imaging location?    Answer:   [facility]   CT CHEST ABDOMEN PELVIS W CONTRAST    Standing Status:   Future    Standing Expiration Date:   09/27/2021    Order Specific Question:   If indicated for the ordered procedure, I authorize the administration of contrast media per Radiology protocol    Answer:   Yes    Order Specific Question:   Preferred imaging location?    Answer:   [facility]    Order Specific Question:   Is Oral Contrast requested for this exam?     Answer:   Yes, Per Radiology protocol    Order Specific Question:   Reason for Exam (SYMPTOM  OR DIAGNOSIS REQUIRED)    Answer:   pelvis lesion on MRI, assess for primary tumor/other bone lesions   CBC with Differential (Cancer Center Only)    Standing Status:   Future    Number of Occurrences:   1    Standing Expiration Date:   09/27/2021   CMP (Cancer Center only)    Standing Status:   Future    Number of Occurrences:   1    Standing Expiration Date:   09/27/2021   Lactate dehydrogenase (LDH)    Standing Status:   Future    Number of Occurrences:   1    Standing Expiration Date:   09/27/2021   Prostate-Specific AG, Serum    Standing Status:   Future    Number of Occurrences:   1    Standing Expiration Date:   09/27/2021   Multiple Myeloma Panel (SPEP&IFE w/QIG)    Standing Status:   Future    Number of Occurrences:   1    Standing Expiration Date:  09/27/2021   Kappa/lambda light chains    Standing Status:   Future    Number of Occurrences:   1    Standing Expiration Date:   09/27/2021    All questions were answered. The patient knows to call the clinic with any problems, questions or concerns.  A total of more than 60 minutes were spent on this encounter with face-to-face time and non-face-to-face time, including preparing to see the patient, ordering tests and/or medications, counseling the patient and coordination of care as outlined above.   Ledell Peoples, MD Department of Hematology/Oncology [place] at [facility] Phone: [phone] Pager: [phone] Email: [email or web address]  10/02/2020 8:10 PM

## 2020-09-28 LAB — PROSTATE-SPECIFIC AG, SERUM (LABCORP): Prostate Specific Ag, Serum: <0.1 ng/mL (ref 0.0–4.0)

## 2020-09-30 LAB — KAPPA/LAMBDA LIGHT CHAINS
Kappa free light chain: 25.6 mg/L — ABNORMAL HIGH (ref 3.3–19.4)
Kappa, lambda light chain ratio: 1.11 (ref 0.26–1.65)
Lambda free light chains: 23 mg/L (ref 5.7–26.3)

## 2020-10-01 ENCOUNTER — Other Ambulatory Visit (HOSPITAL_BASED_OUTPATIENT_CLINIC_OR_DEPARTMENT_OTHER): Payer: Self-pay

## 2020-10-01 ENCOUNTER — Ambulatory Visit: Payer: PPO | Attending: Internal Medicine

## 2020-10-01 DIAGNOSIS — Z23 Encounter for immunization: Secondary | ICD-10-CM

## 2020-10-01 LAB — MULTIPLE MYELOMA PANEL, SERUM
Albumin SerPl Elph-Mcnc: 4 g/dL (ref 2.9–4.4)
Albumin/Glob SerPl: 1.9 — ABNORMAL HIGH (ref 0.7–1.7)
Alpha 1: 0.2 g/dL (ref 0.0–0.4)
Alpha2 Glob SerPl Elph-Mcnc: 0.5 g/dL (ref 0.4–1.0)
B-Globulin SerPl Elph-Mcnc: 0.8 g/dL (ref 0.7–1.3)
Gamma Glob SerPl Elph-Mcnc: 0.6 g/dL (ref 0.4–1.8)
Globulin, Total: 2.2 g/dL (ref 2.2–3.9)
IgA: 172 mg/dL (ref 61–437)
IgG (Immunoglobin G), Serum: 760 mg/dL (ref 603–1613)
IgM (Immunoglobulin M), Srm: 63 mg/dL (ref 15–143)
Total Protein ELP: 6.2 g/dL (ref 6.0–8.5)

## 2020-10-01 MED ORDER — PFIZER-BIONT COVID-19 VAC-TRIS 30 MCG/0.3ML IM SUSP
INTRAMUSCULAR | 0 refills | Status: DC
  Filled 2020-10-01: qty 0.3, 1d supply, fill #0

## 2020-10-01 NOTE — Progress Notes (Signed)
   Covid-19 Vaccination Clinic  Name:  HYUN FRIESE    MRN: [id number] DOB: 23-Nov-1932  10/01/2020  Mr. Lapiana was observed post Covid-19 immunization for 15 minutes without incident. He was provided with Vaccine Information Sheet and instruction to access the V-Safe system.   Mr. Egnew was instructed to call 911 with any severe reactions post vaccine: Difficulty breathing  Swelling of face and throat  A fast heartbeat  A bad rash all over body  Dizziness and weakness   Immunizations Administered     Name Date Dose VIS Date Route   PFIZER Comrnaty(Gray TOP) Covid-19 Vaccine 10/01/2020  1:53 PM 0.3 mL 02/01/2020 Intramuscular   Manufacturer: [place]   Lot: [id number]   NDC: [phone]

## 2020-10-09 ENCOUNTER — Ambulatory Visit (HOSPITAL_COMMUNITY)
Admission: RE | Admit: 2020-10-09 | Discharge: 2020-10-09 | Disposition: A | Discharge status: Home / Self Care | Payer: PPO | Source: Ambulatory Visit | Attending: Hematology and Oncology | Admitting: Hematology and Oncology

## 2020-10-09 ENCOUNTER — Encounter (HOSPITAL_COMMUNITY): Payer: Self-pay

## 2020-10-09 ENCOUNTER — Encounter (HOSPITAL_COMMUNITY)
Admission: RE | Admit: 2020-10-09 | Discharge: 2020-10-09 | Disposition: A | Discharge status: Home / Self Care | Payer: PPO | Source: Ambulatory Visit | Attending: Hematology and Oncology | Admitting: Hematology and Oncology

## 2020-10-09 ENCOUNTER — Other Ambulatory Visit: Payer: Self-pay

## 2020-10-09 DIAGNOSIS — C7951 Secondary malignant neoplasm of bone: Secondary | ICD-10-CM | POA: Diagnosis not present

## 2020-10-09 DIAGNOSIS — K5730 Diverticulosis of large intestine without perforation or abscess without bleeding: Secondary | ICD-10-CM | POA: Diagnosis not present

## 2020-10-09 DIAGNOSIS — K449 Diaphragmatic hernia without obstruction or gangrene: Secondary | ICD-10-CM | POA: Insufficient documentation

## 2020-10-09 DIAGNOSIS — M19011 Primary osteoarthritis, right shoulder: Secondary | ICD-10-CM | POA: Diagnosis not present

## 2020-10-09 DIAGNOSIS — Z8546 Personal history of malignant neoplasm of prostate: Secondary | ICD-10-CM | POA: Diagnosis not present

## 2020-10-09 DIAGNOSIS — E041 Nontoxic single thyroid nodule: Secondary | ICD-10-CM | POA: Insufficient documentation

## 2020-10-09 DIAGNOSIS — D1809 Hemangioma of other sites: Secondary | ICD-10-CM | POA: Diagnosis not present

## 2020-10-09 DIAGNOSIS — K4090 Unilateral inguinal hernia, without obstruction or gangrene, not specified as recurrent: Secondary | ICD-10-CM | POA: Diagnosis not present

## 2020-10-09 DIAGNOSIS — I722 Aneurysm of renal artery: Secondary | ICD-10-CM | POA: Insufficient documentation

## 2020-10-09 DIAGNOSIS — I712 Thoracic aortic aneurysm, without rupture: Secondary | ICD-10-CM | POA: Diagnosis not present

## 2020-10-09 DIAGNOSIS — I898 Other specified noninfective disorders of lymphatic vessels and lymph nodes: Secondary | ICD-10-CM | POA: Diagnosis not present

## 2020-10-09 DIAGNOSIS — I2510 Atherosclerotic heart disease of native coronary artery without angina pectoris: Secondary | ICD-10-CM | POA: Insufficient documentation

## 2020-10-09 DIAGNOSIS — M170 Bilateral primary osteoarthritis of knee: Secondary | ICD-10-CM | POA: Diagnosis not present

## 2020-10-09 DIAGNOSIS — M899 Disorder of bone, unspecified: Secondary | ICD-10-CM | POA: Insufficient documentation

## 2020-10-09 DIAGNOSIS — M19012 Primary osteoarthritis, left shoulder: Secondary | ICD-10-CM | POA: Diagnosis not present

## 2020-10-09 MED ORDER — IOHEXOL 350 MG/ML SOLN
80.0000 mL | Freq: Once | INTRAVENOUS | Status: AC | PRN
  Administered 2020-10-09: 80 mL via INTRAVENOUS

## 2020-10-09 MED ORDER — TECHNETIUM TC 99M MEDRONATE IV KIT
19.4000 | PACK | Freq: Once | INTRAVENOUS | Status: AC
  Administered 2020-10-09: 19.4 via INTRAVENOUS

## 2020-10-16 ENCOUNTER — Telehealth: Payer: Self-pay | Admitting: *Deleted

## 2020-10-16 ENCOUNTER — Telehealth: Payer: Self-pay | Admitting: Hematology and Oncology

## 2020-10-16 DIAGNOSIS — M899 Disorder of bone, unspecified: Secondary | ICD-10-CM

## 2020-10-16 NOTE — Telephone Encounter (Signed)
Called Martin Curry to discuss results of his nuclear medicine bone scan and CT chest abdomen pelvis.  Unfortunately there are bone only lesions and no clear evidence of primary disease.  As such I would recommend that he pursue a biopsy of 1 of these bone lesions in order to determine the diagnosis.  Blood work was also equally unrevealing with a normal PSA and no evidence of the patient voices understanding of this plan moving forward.  Orders placed for biopsy of the bone lesion.  Ledell Peoples, MD Department of Hematology/Oncology [place] at [facility] Phone: [phone] Pager: [phone] Email: [name]

## 2020-10-16 NOTE — Telephone Encounter (Signed)
Received vm message from pt regarding the results of 2 scans he had last week. He would like Dr. Lorenso Courier to call him to discuss the results and any next steps.

## 2020-10-17 ENCOUNTER — Other Ambulatory Visit: Payer: Self-pay

## 2020-10-17 ENCOUNTER — Ambulatory Visit (INDEPENDENT_AMBULATORY_CARE_PROVIDER_SITE_OTHER): Payer: PPO | Admitting: Dermatology

## 2020-10-17 ENCOUNTER — Encounter (HOSPITAL_COMMUNITY): Payer: Self-pay | Admitting: Radiology

## 2020-10-17 ENCOUNTER — Encounter: Payer: Self-pay | Admitting: Dermatology

## 2020-10-17 DIAGNOSIS — D044 Carcinoma in situ of skin of scalp and neck: Secondary | ICD-10-CM

## 2020-10-17 DIAGNOSIS — D099 Carcinoma in situ, unspecified: Secondary | ICD-10-CM

## 2020-10-17 NOTE — Patient Instructions (Signed)

## 2020-10-17 NOTE — Progress Notes (Signed)
Patient Name  Martin Curry, Martin Curry Legal Sex  Male DOB  11-18-1932 SSN  [id number] Address  [id number]  [place] [address] Phone  [phone] (Home) *Preferred*  [phone] (Mobile)    RE: CT Biopsy /CT BONE TROCAR/NEEDLE BIOPSY DEEP Received: Today Suttle, Rosanne Ashing, MD  Garth Bigness D Approved for CT guided bone biopsy of the right posterior greater trochanter.  MRI 09/18/20 (series 9, image 34).   [place]        Previous Messages   ----- Message -----  From: Garth Bigness D  Sent: 10/16/2020   6:06 PM EDT  To: Ir Procedure Requests  Subject: CT Biopsy /CT BONE TROCAR/NEEDLE BIOPSY DEEP   Procedure:  CT Biopsy / CT BONE TROCAR/NEEDLE BIOPSY DEEP   Reason:  bone lesion, concerning for metastatic disease of unclear primary   History:  NM, CT, MR in computer   Provider:  Ledell Peoples IV   Provider Contact:   [phone]

## 2020-10-25 ENCOUNTER — Other Ambulatory Visit: Payer: Self-pay | Admitting: Radiology

## 2020-10-28 ENCOUNTER — Encounter: Payer: Self-pay | Admitting: Dermatology

## 2020-10-28 NOTE — Progress Notes (Signed)
   Follow-Up Visit   Subjective  Martin Curry is a 87 y.o. male who presents for the following: Procedure (Cis x1 scalp ).  CIS scalp Location:  Duration:  Quality:  Associated Signs/Symptoms: Modifying Factors:  Severity:  Timing: Context:   Objective  Well appearing patient in no apparent distress; mood and affect are within normal limits. Mid Parietal Scalp Lesion identified by Dr.Adlean Hardeman and nurse in room.      A focused examination was performed including head and neck. Relevant physical exam findings are noted in the Assessment and Plan.   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  Destruction of lesion Complexity: simple   Destruction method: electrodesiccation and curettage   Informed consent: discussed and consent obtained   Timeout:  patient name, date of birth, surgical site, and procedure verified Anesthesia: the lesion was anesthetized in a standard fashion   Anesthetic:  1% lidocaine w/ epinephrine 1-100,000 local infiltration Curettage performed in three different directions: Yes   Electrodesiccation performed over the curetted area: Yes   Curettage cycles:  3 Lesion length (cm):  2 Lesion width (cm):  2 Margin per side (cm):  0 Final wound size (cm):  2 Hemostasis achieved with:  ferric subsulfate Outcome: patient tolerated procedure well with no complications   Additional details:  Wound innoculated with 5 fluorouracil solution.      I, Lavonna Monarch, MD, have reviewed all documentation for this visit.  The documentation on 10/28/20 for the exam, diagnosis, procedures, and orders are all accurate and complete.

## 2020-10-29 ENCOUNTER — Other Ambulatory Visit: Payer: Self-pay

## 2020-10-29 ENCOUNTER — Ambulatory Visit (HOSPITAL_COMMUNITY)
Admission: RE | Admit: 2020-10-29 | Discharge: 2020-10-29 | Disposition: A | Discharge status: Home / Self Care | Payer: PPO | Source: Ambulatory Visit | Attending: Hematology and Oncology | Admitting: Hematology and Oncology

## 2020-10-29 ENCOUNTER — Encounter (HOSPITAL_COMMUNITY): Payer: Self-pay

## 2020-10-29 DIAGNOSIS — I2510 Atherosclerotic heart disease of native coronary artery without angina pectoris: Secondary | ICD-10-CM | POA: Insufficient documentation

## 2020-10-29 DIAGNOSIS — M898X8 Other specified disorders of bone, other site: Secondary | ICD-10-CM | POA: Diagnosis not present

## 2020-10-29 DIAGNOSIS — Z79890 Hormone replacement therapy (postmenopausal): Secondary | ICD-10-CM | POA: Diagnosis not present

## 2020-10-29 DIAGNOSIS — I700 Atherosclerosis of aorta: Secondary | ICD-10-CM | POA: Diagnosis not present

## 2020-10-29 DIAGNOSIS — Z7982 Long term (current) use of aspirin: Secondary | ICD-10-CM | POA: Diagnosis not present

## 2020-10-29 DIAGNOSIS — Z8546 Personal history of malignant neoplasm of prostate: Secondary | ICD-10-CM | POA: Diagnosis not present

## 2020-10-29 DIAGNOSIS — Z79899 Other long term (current) drug therapy: Secondary | ICD-10-CM | POA: Insufficient documentation

## 2020-10-29 DIAGNOSIS — M899 Disorder of bone, unspecified: Secondary | ICD-10-CM | POA: Diagnosis not present

## 2020-10-29 LAB — CBC
HCT: 46.2 % (ref 39.0–52.0)
Hemoglobin: 16 g/dL (ref 13.0–17.0)
MCH: 32.9 pg (ref 26.0–34.0)
MCHC: 34.6 g/dL (ref 30.0–36.0)
MCV: 94.9 fL (ref 80.0–100.0)
Platelets: 237 10*3/uL (ref 150–400)
RBC: 4.87 MIL/uL (ref 4.22–5.81)
RDW: 13 % (ref 11.5–15.5)
WBC: 6.6 10*3/uL (ref 4.0–10.5)
nRBC: 0 % (ref 0.0–0.2)

## 2020-10-29 LAB — PROTIME-INR
INR: 1 (ref 0.8–1.2)
Prothrombin Time: 13 seconds (ref 11.4–15.2)

## 2020-10-29 MED ORDER — MIDAZOLAM HCL 2 MG/2ML IJ SOLN
INTRAMUSCULAR | Status: AC
  Filled 2020-10-29: qty 2

## 2020-10-29 MED ORDER — MIDAZOLAM HCL 2 MG/2ML IJ SOLN
INTRAMUSCULAR | Status: AC | PRN
Start: 2020-10-29 — End: 2020-10-29
  Administered 2020-10-29 (×2): 1 mg via INTRAVENOUS

## 2020-10-29 MED ORDER — FENTANYL CITRATE (PF) 100 MCG/2ML IJ SOLN
INTRAMUSCULAR | Status: AC
  Filled 2020-10-29: qty 2

## 2020-10-29 MED ORDER — FLUMAZENIL 0.5 MG/5ML IV SOLN
INTRAVENOUS | Status: AC
  Filled 2020-10-29: qty 5

## 2020-10-29 MED ORDER — LIDOCAINE HCL (PF) 1 % IJ SOLN
INTRAMUSCULAR | Status: AC | PRN
  Administered 2020-10-29: 10 mL via INTRADERMAL

## 2020-10-29 MED ORDER — NALOXONE HCL 0.4 MG/ML IJ SOLN
INTRAMUSCULAR | Status: AC
  Filled 2020-10-29: qty 1

## 2020-10-29 MED ORDER — FENTANYL CITRATE (PF) 100 MCG/2ML IJ SOLN
INTRAMUSCULAR | Status: AC | PRN
  Administered 2020-10-29 (×2): 50 ug via INTRAVENOUS

## 2020-10-29 MED ORDER — SODIUM CHLORIDE 0.9 % IV SOLN: INTRAVENOUS | Status: DC

## 2020-10-29 NOTE — Procedures (Signed)
Interventional Radiology Procedure:   Indications: Indeterminate bone lesions  Procedure: CT guided biopsy of left iliac bone lesion  Findings: 2 cores obtained from lucent bone lesion  Complications: No immediate complications noted.     EBL: Minimal  Plan: Discharge to home in 1 hour   Hadley Soileau R. Anselm Pancoast, MD  Pager: [phone]

## 2020-10-29 NOTE — H&P (Signed)
Chief Complaint: Patient was seen in consultation today for bone lesion bx at the request of Dorsey,John T IV  Referring Physician(s): Dorsey,John T IV  Supervising Physician: Markus Daft  Patient Status: [facility] - Out-pt  History of Present Illness: Martin Curry is a 87 y.o. male with remote hx of prostate cancer. Developed some pelvic and hip pains over the past few months and has had extensive imaging workup which finds several bone lesions. His PSA is normal but after seeing oncology, they wish to pursue biopsy. He is referred for bone lesion biopsy. PMHx, meds, labs, imaging, allergies reviewed. Feels well, no recent fevers, chills, illness. Has been NPO today as directed.   Past Medical History:  Diagnosis Date   Atypical nevus 09/07/2002   left abdomen-slight   BCC (basal cell carcinoma of skin) 07/19/2014   left sideburn- +margin-exc.   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   Nodular basal cell carcinoma (BCC) 07/16/2015   left scalp-CX35FU   Polycythemia    Prostate cancer ([place]) 1990's   total prostatectomy and adjuvant radiation. Last PSA was 0.02 in 09/2011   SCC (squamous cell carcinoma) 04/03/2009   forehead-txpbx   SCC (squamous cell carcinoma) 07/16/2015   left front scalp   SCC (squamous cell carcinoma) 07/16/2015   left front scalp-Cx35FU   SCCA (squamous cell carcinoma)  of skin 12/20/2019   Right forehead (in situ)   SCCA (squamous cell carcinoma) of skin 05/27/2020   Mid Parietal Scalp (in situ)   Superficial basal cell carcinoma (BCC) 11/21/2012   behind left ear-CX35FU, midback-CX35FU, right forehead-CX35FU-exc   Superficial basal cell carcinoma (BCC) 11/02/2017   left post neck-CX35FU   Superficial nodular basal cell carcinoma (BCC) 12/20/2019   Left temporal scalp   Superficial nodular basal cell carcinoma (BCC) 05/27/2020   Right Preauricular area (curet and 5FU)    Past Surgical History:  Procedure Laterality Date   ADJACENT TISSUE TRANSFER/TISSUE REARRANGEMENT N/A 05/22/2019   Procedure: COMPLEX CLOSURE OF NECK WOUND;  Surgeon: Cindra Presume, MD;  Location: [place];  Service: Plastics;  Laterality: N/A;   ALLOGRAFT APPLICATION N/A [id number]   Procedure: POSSIBLE FACIAL NERVE RECONSTRUCTION WITH NERVE ALLOGRAFT VS AUTOGRAFT;  Surgeon: Cindra Presume, MD;  Location: [place];  Service: Plastics;  Laterality: N/A;   BLADDER SURGERY     due to prostatectomy.    CATARACT EXTRACTION     right   COLONOSCOPY     neg in the past; due in 2014 with [place] Dr. Sharlett Iles   EYE SURGERY     50 years ago.    HERNIA REPAIR     MOHS SURGERY     NECK DISSECTION  05/22/2019    NECK DISSECTION   PAROTIDECTOMY  05/22/2019   PAROTIDECTOMY with facial nerve dissection    PAROTIDECTOMY Right 05/22/2019  Procedure: PAROTIDECTOMY;  Surgeon: Izora Gala, MD;  Location: [place];  Service: ENT;  Laterality: Right;   PROSTATE SURGERY     RADICAL NECK DISSECTION Right 05/22/2019   Procedure: RADICAL NECK DISSECTION;  Surgeon: Izora Gala, MD;  Location: [place];  Service: ENT;  Laterality: Right;   TONSILLECTOMY      Allergies: Penicillins, Latex, and Tape  Medications: Prior to Admission medications   Medication Sig Start Date End Date Taking? Authorizing Provider  amLODipine (NORVASC) 5 MG tablet Take 5 mg by mouth daily.  04/17/19  Yes [provider]   benazepril (LOTENSIN) 10 MG tablet Take 10 mg by mouth daily.   Yes [provider]  famotidine-calcium carbonate-magnesium hydroxide (PEPCID COMPLETE) 10-800-165 MG chewable tablet Take one tablet PRN 01/04/18  Yes [provider]  levothyroxine (SYNTHROID) 25 MCG tablet Take 25 mcg by mouth daily before breakfast.   Yes [provider]  Multiple Vitamin (MULTIVITAMIN) tablet Take 1 tablet by mouth daily.   Yes [provider]  potassium citrate (UROCIT-K) 10 MEQ (1080 MG) SR tablet Take 10 mEq by mouth 3 (three) times daily with meals.   Yes [provider]  simvastatin (ZOCOR) 20 MG tablet Take 20 mg by mouth every evening.   Yes [provider]  aspirin 81 MG tablet Take 81 mg by mouth daily.    [provider]  COVID-19 mRNA Vac-TriS, Pfizer, (PFIZER-BIONT COVID-19 VAC-TRIS) SUSP injection Inject into the muscle. 10/01/20   Carlyle Basques, MD  promethazine (PHENERGAN) 25 MG suppository Place 1 suppository (25 mg total) rectally every 6 (six) hours as needed for nausea or vomiting. 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  All other systems are negative.  Review of Systems  Vital Signs: BP 111/70   Pulse 65   Temp 97.8 F (36.6 C) (Oral)   Resp 12   SpO2 98%   Physical Exam Constitutional:      Appearance: Normal appearance. He is not ill-appearing.  HENT:     Mouth/Throat:     Mouth: Mucous membranes are moist.     Pharynx: Oropharynx is clear.  Cardiovascular:     Rate and Rhythm: Normal rate and  regular rhythm.     Heart sounds: Normal heart sounds.  Pulmonary:     Effort: Pulmonary effort is normal. No respiratory distress.     Breath sounds: Normal breath sounds.  Skin:    General: Skin is warm and dry.  Neurological:     General: No focal deficit present.     Mental Status: He is alert and oriented to person, place, and time.  Psychiatric:        Mood and Affect: Mood normal.        Thought Content: Thought content normal.        Judgment: Judgment normal.    Imaging: NM Bone Scan Whole Body  Result Date: 10/10/2020 CLINICAL DATA:  Pelvic and femoral lesions in this patient with history of prostate cancer. EXAM: NUCLEAR MEDICINE WHOLE BODY BONE SCAN TECHNIQUE: Whole body anterior and posterior images were obtained approximately 3 hours after intravenous injection of radiopharmaceutical. RADIOPHARMACEUTICALS:  19.4 mCi Technetium-72mMDP IV COMPARISON:  Multiple exams, including MRI pelvis 09/18/2020 FINDINGS: Focus of accentuated activity likely corresponding to the right greater trochanteric lesion observed, raising suspicion that this lesion is probably metastatic. Small focus of activity associated with the left femoral head lesion, nonspecific and possibly from the suspected subcortical fracture on MRI. Central sacral  activity is greater than expected and corresponds to the lesion on MRI and CT, suspicious for a metastatic lesion. The known left iliac bone lesion from MRI is not well observed on bone scan. No appreciable abnormal lesion along the thoracic or lumbar spine. Degenerative findings along the shoulders, sternoclavicular joints, knees, and medially in the forefoot bilaterally. Expected renal excretion of activity. IMPRESSION: 1. Accentuated activity associated with the right greater trochanter and the central sacral lesion, suspicious for malignancy given the appearance on other imaging modalities. 2. The left femoral head lesion demonstrates accentuated activity, but is nonspecific for benign versus malignant subcortical fracture. 3. The left iliac bone lesion is not well seen on today's bone scan. Electronically Signed   By: WVan ClinesM.D.   On: 10/10/2020 09:37   CT CHEST ABDOMEN PELVIS W CONTRAST  Result Date: 10/10/2020 CLINICAL DATA:  Prostate cancer with osseous metastatic disease. Prior prostatectomy, prior radiation therapy most recently 1 year ago. EXAM: CT CHEST, ABDOMEN, AND PELVIS WITH CONTRAST TECHNIQUE: Multidetector CT imaging of the chest, abdomen and pelvis was performed following the standard protocol during bolus administration of intravenous contrast. CONTRAST:  863mOMNIPAQUE IOHEXOL 350 MG/ML SOLN COMPARISON:  Multiple exams, including MRI pelvis 09/18/2020 FINDINGS: CT CHEST FINDINGS Cardiovascular: Coronary, aortic arch, and branch vessel atherosclerotic vascular disease. Ascending thoracic aortic aneurysm 4.7 cm in diameter. Mediastinum/Nodes: Mixed enhancement in a 3.3 cm right thyroid nodule on image 10 series 2. Recommend thyroid USKorearef: J Am Coll Radiol. 2015 Feb;12(2): 143-50). 0.9 cm right lower paratracheal node on image 24 series 2 appears to have internal calcification. Speckled calcifications are present in a right eccentric subcarinal lymph node measuring 1.3 cm in  short axis on image 33 series 2. A left paraesophageal lymph node measures 0.9 cm in short axis on image 33 series 2. Left infrahilar node 0.9 cm in short axis on image 31 series 2 with very faint associated calcifications. Small type 1 hiatal hernia. Lungs/Pleura: Biapical densities favoring pleuroparenchymal scarring. Musculoskeletal: Thoracic spondylosis. I do not observe lesions characteristic for metastatic prostate cancer in the thorax. Atrophic distal right sternocleidomastoid muscle. CT ABDOMEN PELVIS FINDINGS Hepatobiliary: 1.1 by 1.0 by 1.1 cm structure tangential to the  posterior margin of the right hepatic lobe on image 26 of series 7 has a portal venous phase density of 46 Hounsfield units and a delayed phase density of 48 Hounsfield units, and accordingly could represent a small complex cyst given the lack of obvious de-enhancement, although this lesion is not entirely specific and has higher than fluid density characteristics. The density characteristics are less than that of the hepatic parenchyma on both portal venous and delayed phase imaging. The gallbladder appears unremarkable.  No biliary dilatation. Pancreas: Unremarkable Spleen: Unremarkable Adrenals/Urinary Tract: Both adrenal glands appear normal. Two left and single right hypodense renal lesions measured fluid density on portal venous and delayed phase images, compatible with cysts. Other tiny hypodense renal lesions are technically too small to characterize although statistically likely to be cysts. Stomach/Bowel: Descending and sigmoid colon diverticulosis. Indirect left inguinal hernia with a small margin of the sigmoid colon extending into the hernia despite the presence of an anterior pelvic hernia mesh. No findings of strangulation or specific complicating feature. Vascular/Lymphatic: Aortoiliac atherosclerotic vascular disease. 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 No overtly pathologic adenopathy is identified. A left  inguinal lymph node measures 1.0 cm in short axis on image 123 of series 2. Reproductive: Prostatectomy. Other: No supplemental non-categorized findings. Musculoskeletal: Hemangioma in the right L1 vertebral body with expected appearance of trabecular coarsening. In the vicinity of the known left iliac bone lesion there is only mild lucency (image 94, series 2.). The S3 vertebral lesion has mixed sclerosis and lucency (image 83, series 6). The right greater trochanteric lesion is essentially occult on CT. Mild lucency posteriorly along the left femoral head suggesting a small osteochondral lesion or subchondral fracture. Nonspecific 0.6 cm hypodense lesion along the anterior inferior L2 vertebral body on image 87 series 6. IMPRESSION: 1. Mixed sclerosis and lucency in the S3 vertebral lesion in the sacrum. The right greater trochanteric lesions seen on MRI is occult by CT. The left iliac bone lesion is mildly lucent, and the left posterior femoral head lesion is primarily lucent. 2. L1 hemangioma. Nonspecific 6 mm L2 vertebral body hypodense lesion. 3. No pathologic adenopathy or compelling findings of extra osseous malignancy. 4. Ascending thoracic aortic aneurysm 4.7 cm in diameter. Ascending thoracic aortic aneurysm. Recommend semi-annual imaging followup by CTA or MRA and referral to cardiothoracic surgery if not already obtained. This recommendation follows 2010 ACCF/AHA/AATS/ACR/ASA/SCA/SCAI/SIR/STS/SVM Guidelines for the Diagnosis and Management of Patients With Thoracic Aortic Disease. Circulation. 2010; [id number]. Aortic aneurysm NOS (ICD10-I71.9) 5. 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 If the patient has uncontrolled hypertension, interventional radiology consultation would be recommended. Otherwise, surveillance imaging within the next 1-2 years would be recommended. 6. Mixed enhancement in a 3.3 cm solid right thyroid nodule, as observed on prior neck CT. Recommend thyroid US (ref: J Am  Coll Radiol. 2015 Feb;12(2): 143-50). 7. Upper normal sized paratracheal, subcarinal, left infrahilar, and left paraesophageal lymph nodes, with some associated calcifications, most likely to be postinflammatory. 8. 1.1 cm complex structure tangential to the posterior margin of the right hepatic lobe does not change much in density between portal venous and delayed phase images and may represent a small exophytic complex cyst but is technically nonspecific. 9. Despite the presence of the pelvic hernia mesh, an indirect left inguinal hernia contains adipose tissue and a small margin of the sigmoid colon, without strangulation or complicating features. 10. Other imaging findings of potential clinical significance: Aortic Atherosclerosis (ICD10-I70.0). Coronary atherosclerosis. Small type 1 hiatal hernia.  Descending and sigmoid colon diverticulosis. Electronically Signed   By: Van Clines [name]   On: 10/10/2020 09:30    Labs:  CBC: Recent Labs    05/16/20 1024 09/27/20 0950 10/29/20 0730  WBC 5.9 6.9 6.6  HGB 16.3 15.8 16.0  HCT 48.2 45.3 46.2  PLT 222 217 237    COAGS: No results for input(s): INR, APTT in the last 8760 hours.  BMP: Recent Labs    02/01/20 1119 05/16/20 1024 09/27/20 0950  NA  --  143 142  K  --  5.0 4.4  CL  --  107 106  CO2  --  30 27  GLUCOSE  --  100* 104*  BUN '15 15 21  '$ CALCIUM  --  9.9 10.2  CREATININE 1.25* 1.40* 1.37*  GFRNONAA 56* 49* 51*    LIVER FUNCTION TESTS: Recent Labs    05/16/20 1024 09/27/20 0950  BILITOT 0.7 0.8  AST 26 27  ALT 27 33  ALKPHOS 75 80  PROT 6.9 7.1  ALBUMIN 4.1 4.3    TUMOR MARKERS: No results for input(s): AFPTM, CEA, CA199, CHROMGRNA in the last 8760 hours.  Assessment and Plan: Pelvic bone lesions, suspicious for malignant process Imaging reviewed. Plan for image guided biopsy of pelvic bone lesion. Risks and benefits of pelvic bone lesion was discussed with the patient and/or patient's family including,  but not limited to bleeding, infection, damage to adjacent structures or low yield requiring additional tests.  All of the questions were answered and there is agreement to proceed.  Consent signed and in chart.   Thank you for this interesting consult.  I greatly enjoyed meeting ELDIE DANTONA and look forward to participating in their care.  A copy of this report was sent to the requesting provider on this date.  Electronically Signed: Ascencion Dike, PA-C 10/29/2020, 8:50 AM   I spent a total of 20 minutes in face to face in clinical consultation, greater than 50% of which was counseling/coordinating care for bone lesion bx

## 2020-11-01 LAB — SURGICAL PATHOLOGY

## 2020-11-04 ENCOUNTER — Telehealth: Payer: Self-pay | Admitting: Hematology and Oncology

## 2020-11-04 ENCOUNTER — Telehealth: Payer: Self-pay | Admitting: *Deleted

## 2020-11-04 NOTE — Telephone Encounter (Signed)
Scheduled per sch msg. Called and spoke with patient. Confirmed appt  

## 2020-11-04 NOTE — Telephone Encounter (Signed)
Received call from pt's daughter, Cephus Shelling. She is aware that her father's bone marrow biopsy results are done and is wondering when her dad will see Dr. Lorenso Courier to discuss. She will be coming with her dad. Advised that we can see him Thursday and that I would have a scheduler call him with appt. She voiced understanding.

## 2020-11-05 ENCOUNTER — Encounter: Payer: Self-pay | Admitting: Hematology and Oncology

## 2020-11-07 ENCOUNTER — Other Ambulatory Visit: Payer: Self-pay

## 2020-11-07 ENCOUNTER — Inpatient Hospital Stay: Payer: PPO | Attending: Hematology and Oncology | Admitting: Hematology and Oncology

## 2020-11-07 VITALS — BP 136/63 | HR 68 | Temp 97.5°F | Resp 18 | Ht 69.0 in | Wt 176.2 lb

## 2020-11-07 DIAGNOSIS — Z79899 Other long term (current) drug therapy: Secondary | ICD-10-CM | POA: Insufficient documentation

## 2020-11-07 DIAGNOSIS — Z923 Personal history of irradiation: Secondary | ICD-10-CM | POA: Diagnosis not present

## 2020-11-07 DIAGNOSIS — M899 Disorder of bone, unspecified: Secondary | ICD-10-CM | POA: Diagnosis not present

## 2020-11-07 DIAGNOSIS — Z8546 Personal history of malignant neoplasm of prostate: Secondary | ICD-10-CM | POA: Diagnosis not present

## 2020-11-07 DIAGNOSIS — M859 Disorder of bone density and structure, unspecified: Secondary | ICD-10-CM | POA: Diagnosis not present

## 2020-11-07 NOTE — Progress Notes (Signed)
Martin Curry presents today for follow-up after completing radiation to the right neck/scalp on 07/26/2019  Pain issues, if any: Reports occassional discomfort down the right side of his neck, but states it is manageable and will resolve after massaging or stretching the area. Using a feeding tube?: N/A Weight changes, if any: Reports an increase in appetite  Wt Readings from Last 3 Encounters:  11/08/20 175 lb 12.8 oz (79.7 kg)  11/07/20 176 lb 3.2 oz (79.9 kg)  09/27/20 181 lb 6.4 oz (82.3 kg)   Swallowing issues, if any: Patient denies. Reports he is eating and drinking a wide variety of food/beverages.  Smoking or chewing tobacco? None Using fluoride trays daily? N/A Last ENT visit was on: 04/23/2020 Saw Dr. Izora Gala: "Return visit, he continues to improve. He is very pleased with the results of the physical therapy. He still having trouble hearing from the right ear. On exam, the right ear contains some wax buildup. I cannot visualize the tube but there is no signs of infection. The surgical site has healed beautifully. There is no signs of any residual or recurrent tumor. No adenopathy palpable. Stable postop. Recheck again in 6 months or sooner as needed" Other notable issues, if any: Had F/U with medical oncologist Dr. Lorenso Courier on 11/07/20. Overall he is pleased with his continued progress since completing radiation (does report personal stress, but feels he is able to manage)   Vitals:   11/08/20 1521  BP: (!) 155/79  Pulse: 90  Resp: 18  Temp: (!) 97.5 F (36.4 C)  SpO2: 98%

## 2020-11-08 ENCOUNTER — Ambulatory Visit
Admission: RE | Admit: 2020-11-08 | Discharge: 2020-11-08 | Disposition: A | Discharge status: Home / Self Care | Payer: PPO | Source: Ambulatory Visit | Attending: Radiation Oncology | Admitting: Radiation Oncology

## 2020-11-08 ENCOUNTER — Encounter: Payer: Self-pay | Admitting: Radiation Oncology

## 2020-11-08 VITALS — BP 155/79 | HR 90 | Temp 97.5°F | Resp 18 | Ht 69.0 in | Wt 175.8 lb

## 2020-11-08 DIAGNOSIS — Z7982 Long term (current) use of aspirin: Secondary | ICD-10-CM | POA: Diagnosis not present

## 2020-11-08 DIAGNOSIS — Z85828 Personal history of other malignant neoplasm of skin: Secondary | ICD-10-CM | POA: Diagnosis not present

## 2020-11-08 DIAGNOSIS — Z08 Encounter for follow-up examination after completed treatment for malignant neoplasm: Secondary | ICD-10-CM | POA: Diagnosis not present

## 2020-11-08 DIAGNOSIS — Z79899 Other long term (current) drug therapy: Secondary | ICD-10-CM | POA: Diagnosis not present

## 2020-11-08 DIAGNOSIS — C4441 Basal cell carcinoma of skin of scalp and neck: Secondary | ICD-10-CM

## 2020-11-08 DIAGNOSIS — Z923 Personal history of irradiation: Secondary | ICD-10-CM | POA: Diagnosis not present

## 2020-11-08 NOTE — Progress Notes (Signed)
Radiation Oncology         [phone] ________________________________  Name: Martin Curry MRN: [id number]  Date: 11/08/2020  DOB: 04/05/1932  Follow-Up Visit Note  Outpatient  CC: Martin Battles, MD  Cindra Presume, MD  Diagnosis and Prior Radiotherapy:    ICD-10-CM   1. Basal cell carcinoma (BCC) of skin of neck  C44.41       Cancer Staging: Cancer Staging Basal cell carcinoma (BCC) of skin of neck Staging form: Cutaneous Carcinoma of the Head and Neck, AJCC 8th Edition - Clinical stage from 05/02/2019: Stage III (cT3, cN0, cM0) - Signed by Eppie Gibson, MD on 05/03/2019 - Pathologic stage from 06/16/2019: Stage III (pT3, pN1, cM0) - Signed by Eppie Gibson, MD on 06/20/2019   Intent: Curative  Radiation Treatment Dates: 06/28/2019 through 07/26/2019  Site Technique Total Dose (Gy) Dose per Fx (Gy) Completed Fx Beam Energies  Neck Right: HN_Rt IMRT 54/54 2.7 20/20 6X       CHIEF COMPLAINT: Here for follow-up and surveillance of neck cancer  Narrative:      Martin Curry presents today for follow-up after completing radiation to the right neck/scalp on 07/26/2019  Pain issues, if any: Reports occassional discomfort down the right side of his neck, but states it is manageable and will resolve after massaging or stretching the area. Using a feeding tube?: N/A Weight changes, if any: Reports an increase in appetite  Wt Readings from Last 3 Encounters:  11/08/20 175 lb 12.8 oz (79.7 kg)  11/07/20 176 lb 3.2 oz (79.9 kg)  09/27/20 181 lb 6.4 oz (82.3 kg)   Swallowing issues, if any: Patient denies. Reports he is eating and drinking a wide variety of food/beverages.  Smoking or chewing tobacco? None Using fluoride trays daily? N/A Last ENT visit was on: 04/23/2020 Saw Dr. Izora Gala: "Return visit, he continues to improve. He is very pleased with the results of the physical therapy. He still having trouble hearing from the right ear. On exam, the right ear contains some wax  buildup. I cannot visualize the tube but there is no signs of infection. The surgical site has healed beautifully. There is no signs of any residual or recurrent tumor. No adenopathy palpable. Stable postop. Recheck again in 6 months or sooner as needed" Other notable issues, if any: Had F/U with medical oncologist Dr. Lorenso Courier on 11/07/20. Overall he is pleased with his continued progress since completing radiation (does report personal stress, but feels he is able to manage)   Vitals:   11/08/20 1521  BP: (!) 155/79  Pulse: 90  Resp: 18  Temp: (!) 97.5 F (36.4 C)  SpO2: 98%    ALLERGIES:  is allergic to penicillins, latex, and tape.  Meds: Current Outpatient Medications  Medication Sig Dispense Refill   amLODipine (NORVASC) 5 MG tablet Take 5 mg by mouth daily.      aspirin 81 MG tablet Take 81 mg by mouth daily.     benazepril (LOTENSIN) 10 MG tablet Take 10 mg by mouth daily.     COVID-19 mRNA Vac-TriS, Pfizer, (PFIZER-BIONT COVID-19 VAC-TRIS) SUSP injection Inject into the muscle. 0.3 mL 0   famotidine-calcium carbonate-magnesium hydroxide (PEPCID COMPLETE) 10-800-165 MG chewable tablet Take one tablet PRN     levothyroxine (SYNTHROID) 25 MCG tablet Take 25 mcg by mouth daily before breakfast.     Multiple Vitamin (MULTIVITAMIN) tablet Take 1 tablet by mouth daily.     potassium citrate (UROCIT-K) 10 MEQ (1080 MG) SR  tablet Take 10 mEq by mouth 3 (three) times daily with meals.     simvastatin (ZOCOR) 20 MG tablet Take 20 mg by mouth every evening.     No current facility-administered medications for this encounter.    Physical Findings: General : the patient is in no acute distress. Patient is alert and oriented.  height is '5\' 9"'$  (1.753 m) and weight is 175 lb 12.8 oz (79.7 kg). His temporal temperature is 97.5 F (36.4 C) (abnormal). His blood pressure is 155/79 (abnormal) and his pulse is 90. His respiration is 18 and oxygen saturation is 98%. .     Neck: No palpable masses  in the preauricular, postauricular, cervical, or supraclavicular regions.   Stable postoperative concavity in the right upper neck Skin: no concerning skin lesions over the neck Psychiatric: Alert and oriented, no acute distress, judgment and insight appear intact MSK: Ambulatory independently  Lab Findings: Lab Results  Component Value Date   WBC 6.6 10/29/2020   HGB 16.0 10/29/2020   HCT 46.2 10/29/2020   MCV 94.9 10/29/2020   PLT 237 10/29/2020    Radiographic Findings: CT Biopsy  Result Date: 10/29/2020 INDICATION: 87 year old with indeterminate bone lesions. Tissue diagnosis is needed. EXAM: CT-guided biopsy of left iliac bone lesion MEDICATIONS: None. ANESTHESIA/SEDATION: Moderate (conscious) sedation was employed during this procedure. A total of Versed 2.0 mg and Fentanyl 100 mcg was administered intravenously. Moderate Sedation Time: 30 minutes. The patient's level of consciousness and vital signs were monitored continuously by radiology nursing throughout the procedure under my direct supervision. FLUOROSCOPY TIME:  None COMPLICATIONS: None immediate. PROCEDURE: Informed written consent was obtained from the patient after a thorough discussion of the procedural risks, benefits and alternatives. All questions were addressed. A timeout was performed prior to the initiation of the procedure. Patient was placed prone on the CT scanner. CT images through the pelvis were obtained. Lucent lesion along the medial aspect of the left iliac bone was identified and targeted. The overlying skin was prepped with chlorhexidine and sterile field was created. Skin and soft tissues were anesthetized using 1% lidocaine. Using CT guidance, an 11 gauge bone needle was directed onto the posterior left iliac bone cortex. Coaxial bone needle was directed through the 11 gauge needle into the lucent lesion. Total of 2 core biopsies were obtained in this area. Specimens placed in formalin. Needle was removed without  complication. Bandage placed over the puncture site. FINDINGS: Subtle lucent lesion along the posterior left iliac bone which corresponds with the abnormalities on previous MRI. Needle position confirmed within the lesion. Two core biopsies were obtained. IMPRESSION: CT-guided core biopsies of left iliac bone lesion. Electronically Signed   By: Markus Daft [name]   On: 10/29/2020 11:16     Impression/Plan: This is a very pleasant 87 year old gentleman with a history of locally advanced basal cell carcinoma of the right neck.  He is in remission without any significant long-term side effects from radiation.  He continues to follow closely with otolaryngology.  He is also following with medical oncology due to marrow replacing lesions in the bony pelvis.  Biopsy was negative for malignancy.  I will see him back as needed.  He knows to continue to follow closely with otolaryngology and medical oncology.  I wished him the very best and he expressed gratitude for the care that we have provided here.  Anderson Malta our navigator will contact Dr. Janeice Robinson clinic to advise that he has a follow-up in 4 months.  On date  of service, in total, I spent 20 minutes on this encounter. Patient was seen in person.  _____________________________________   Eppie Gibson, MD

## 2020-11-12 NOTE — Progress Notes (Signed)
Oncology Nurse Navigator Documentation   Per patient's 11/08/20 post-treatment follow-up with Dr. Isidore Moos, sent fax to [facility] ENT Scheduling with request Mr. Safley be contacted and scheduled for routine post-RT follow-up in 4 months with Dr. Constance Holster.  Notification of successful fax transmission received.   Harlow Asa RN, BSN, OCN Head & Neck Oncology Nurse [place] at [facility] Phone # [phone]  Fax # [phone]

## 2020-11-12 NOTE — Progress Notes (Signed)
[place] [phone]   [phone]  PROGRESS NOTE  Patient Care Team: Leanna Battles, MD as PCP - General (Internal Medicine) Leanna Battles, MD as Consulting Physician (Internal Medicine) Nobie Putnam, MD (Hematology and Oncology) Irine Seal, MD as Attending Physician (Urology) Sable Feil, MD as Attending Physician (Gastroenterology) Lavonna Monarch, MD as Consulting Physician (Dermatology) Eppie Gibson, MD as Attending Physician (Radiation Oncology) Leota Sauers, RN (Inactive) as Oncology Nurse Navigator [place], Stephani Police, RN as Oncology Nurse Navigator (Oncology)  Hematological/Oncological History # Bone Lesions on CT/MRI 08/30/2020: CT Pelvis W contrast showed 1.2 cm lucent lesion over the superior left iliac bone adjacent the sacroiliac joint corresponding to signal abnormality seen on MRI 09/19/2020: MRI pelvis WWO showed Marrow replacing lesions within the left iliac bone and sacrum. Additionally there was noted to be a new enhancing lesion at the tip of the right greater trochanter measuring up to 1.7 cm also suspicious for metastatic disease. 09/27/2020: establish care with Dr. Lorenso Courier 10/29/2020: CT biopsy performed of left iliac bone lesion. Patholoyg showed bone and marrow with evidence of carcinoma.   Interval History:  Martin Curry 87 y.o. male with medical history significant for bone lesions noted on CT/MRI who presents for a follow up visit. The patient's last visit was on 09/27/2020 at which time he established care. In the interim since the last visit he completed a CT biopsy of the bone lesion with benign results.   On exam today Martin Curry is accompanied by his wife.  He reports that the procedure went well and he was under twilight sedation during it.  He did not have any residual pain or difficulties following the biopsy.  Overall he reports that he is at his baseline level of health similar to our last visit.  Full 10  point ROS is listed below.  The bulk of our discussion focused on the results of the biopsy and the neck steps moving forward.  The details this conversation are listed below.  MEDICAL HISTORY:  Past Medical History:  Diagnosis Date   Atypical nevus 09/07/2002   left abdomen-slight   BCC (basal cell carcinoma of skin) 07/19/2014   left sideburn- +margin-exc.   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   Nodular basal cell carcinoma (BCC) 07/16/2015   left scalp-CX35FU   Polycythemia    Prostate cancer ([place]) 1990's   total prostatectomy and adjuvant radiation. Last PSA was 0.02 in 09/2011   SCC (squamous cell carcinoma) 04/03/2009   forehead-txpbx   SCC (squamous cell carcinoma) 07/16/2015   left front scalp   SCC (squamous cell carcinoma) 07/16/2015   left front scalp-Cx35FU   SCCA (squamous cell carcinoma) of skin 12/20/2019   Right forehead (in situ)   SCCA (squamous cell carcinoma) of skin 05/27/2020   Mid Parietal Scalp (in situ)  Superficial basal cell carcinoma (BCC) 11/21/2012   behind left ear-CX35FU, midback-CX35FU, right forehead-CX35FU-exc   Superficial basal cell carcinoma (BCC) 11/02/2017   left post neck-CX35FU   Superficial nodular basal cell carcinoma (BCC) 12/20/2019   Left temporal scalp   Superficial nodular basal cell carcinoma (BCC) 05/27/2020   Right Preauricular area (curet  and 5FU)    SURGICAL HISTORY: Past Surgical History:  Procedure Laterality Date   ADJACENT TISSUE TRANSFER/TISSUE REARRANGEMENT N/A 05/22/2019   Procedure: COMPLEX CLOSURE OF NECK WOUND;  Surgeon: Cindra Presume, MD;  Location: [place];  Service: Plastics;  Laterality: N/A;   ALLOGRAFT APPLICATION N/A 07/25/930   Procedure: POSSIBLE FACIAL NERVE RECONSTRUCTION WITH NERVE ALLOGRAFT VS AUTOGRAFT;  Surgeon: Cindra Presume, MD;  Location: [place];  Service: Plastics;  Laterality: N/A;   BLADDER SURGERY     due to prostatectomy.    CATARACT EXTRACTION     right   COLONOSCOPY     neg in the past; due in 2014 with [place] Dr. Sharlett Iles   EYE SURGERY     50 years ago.    HERNIA REPAIR     MOHS SURGERY     NECK DISSECTION  05/22/2019    NECK DISSECTION   PAROTIDECTOMY  05/22/2019   PAROTIDECTOMY with facial nerve dissection    PAROTIDECTOMY Right 05/22/2019   Procedure: PAROTIDECTOMY;  Surgeon: Izora Gala, MD;  Location: [place];  Service: ENT;  Laterality: Right;   PROSTATE SURGERY     RADICAL NECK DISSECTION Right 05/22/2019   Procedure: RADICAL NECK DISSECTION;  Surgeon: Izora Gala, MD;  Location: [place];  Service: ENT;  Laterality: Right;   TONSILLECTOMY      SOCIAL HISTORY: Social History   Socioeconomic History   Marital status: Married    Spouse name: Not on file   Number of children: 3   Years of education: Not on file   Highest education level: Not on file  Occupational History    Comment: retired IT consultant; 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  Tobacco Use   Smoking status: Never   Smokeless tobacco: Never  Vaping Use   Vaping Use: Never used  Substance and Sexual Activity   Alcohol use: No   Drug use: No   Sexual activity: Not on file  Other Topics Concern   Not on file  Social History Narrative   Not on file   Social Determinants of Health   Financial Resource Strain: Not on file  Food Insecurity: Not on file  Transportation Needs: Not on file  Physical Activity: Not on  file  Stress: Not on file  Social Connections: Not on file  Intimate Partner Violence: Not on file    FAMILY HISTORY: Family History  Problem Relation Age of Onset   Uterine cancer Mother 20   Lung cancer Father 46   Prostate cancer Brother    Prostate cancer Brother     ALLERGIES:  is allergic to penicillins, latex, and tape.  MEDICATIONS:  Current Outpatient Medications  Medication Sig Dispense Refill   amLODipine (NORVASC) 5 MG tablet Take 5 mg by mouth daily.      aspirin 81 MG tablet Take 81 mg by mouth daily.     benazepril (LOTENSIN) 10 MG tablet Take 10 mg by mouth daily.     COVID-19 mRNA Vac-TriS, Pfizer, (PFIZER-BIONT COVID-19 VAC-TRIS) SUSP injection Inject into the muscle. 0.3 mL 0   famotidine-calcium carbonate-magnesium hydroxide (PEPCID COMPLETE) 10-800-165 MG chewable tablet Take one tablet PRN  levothyroxine (SYNTHROID) 25 MCG tablet Take 25 mcg by mouth daily before breakfast.     Multiple Vitamin (MULTIVITAMIN) tablet Take 1 tablet by mouth daily.     potassium citrate (UROCIT-K) 10 MEQ (1080 MG) SR tablet Take 10 mEq by mouth 3 (three) times daily with meals.     simvastatin (ZOCOR) 20 MG tablet Take 20 mg by mouth every evening.     No current facility-administered medications for this visit.    REVIEW OF SYSTEMS:   Constitutional: ( - ) fevers, ( - )  chills , ( - ) night sweats Eyes: ( - ) blurriness of vision, ( - ) double vision, ( - ) watery eyes Ears, nose, mouth, throat, and face: ( - ) mucositis, ( - ) sore throat Respiratory: ( - ) cough, ( - ) dyspnea, ( - ) wheezes Cardiovascular: ( - ) palpitation, ( - ) chest discomfort, ( - ) lower extremity swelling Gastrointestinal:  ( - ) nausea, ( - ) heartburn, ( - ) change in bowel habits Skin: ( - ) abnormal skin rashes Lymphatics: ( - ) new lymphadenopathy, ( - ) easy bruising Neurological: ( - ) numbness, ( - ) tingling, ( - ) new weaknesses Behavioral/Psych: ( - ) mood change, ( - ) new  changes  All other systems were reviewed with the patient and are negative.  PHYSICAL EXAMINATION: ECOG PERFORMANCE STATUS: 1 - Symptomatic but completely ambulatory  Vitals:   11/07/20 1030  BP: 136/63  Pulse: 68  Resp: 18  Temp: (!) 97.5 F (36.4 C)  SpO2: 99%   Filed Weights   11/07/20 1030  Weight: 176 lb 3.2 oz (79.9 kg)    GENERAL: Well-appearing elderly Caucasian male, alert, no distress and comfortable SKIN: skin color, texture, turgor are normal, no rashes or significant lesions EYES: conjunctiva are pink and non-injected, sclera clear LUNGS: clear to auscultation and percussion with normal breathing effort HEART: regular rate & rhythm and no murmurs and no lower extremity edema Musculoskeletal: no cyanosis of digits and no clubbing  PSYCH: alert & oriented x 3, fluent speech NEURO: no focal motor/sensory deficits  LABORATORY DATA:  I have reviewed the data as listed CBC Latest Ref Rng & Units 10/29/2020 09/27/2020 05/16/2020  WBC 4.0 - 10.5 K/uL 6.6 6.9 5.9  Hemoglobin 13.0 - 17.0 g/dL 16.0 15.8 16.3  Hematocrit 39.0 - 52.0 % 46.2 45.3 48.2  Platelets 150 - 400 K/uL 237 217 222    CMP Latest Ref Rng & Units 09/27/2020 05/16/2020 02/01/2020  Glucose 70 - 99 mg/dL 104(H) 100(H) -  BUN 8 - 23 mg/dL _0 Creatinine 0.61 - 1.24 mg/dL 1.37(H) 1.40(H) 1.25(H)  Sodium 135 - 145 mmol/L 142 143 -  Potassium 3.5 - 5.1 mmol/L 4.4 5.0 -  Chloride 98 - 111 mmol/L 106 107 -  CO2 22 - 32 mmol/L 27 30 -  Calcium 8.9 - 10.3 mg/dL 10.2 9.9 -  Total Protein 6.5 - 8.1 g/dL 7.1 6.9 -  Total Bilirubin 0.3 - 1.2 mg/dL 0.8 0.7 -  Alkaline Phos 38 - 126 U/L 80 75 -  AST 15 - 41 U/L 27 26 -  ALT 0 - 44 U/L 33 27 -    Lab Results  Component Value Date   MPROTEIN Not Observed 09/27/2020   Lab Results  Component Value Date   KPAFRELGTCHN 25.6 (H) 09/27/2020   LAMBDASER 23.0 09/27/2020   KAPLAMBRATIO 1.11 09/27/2020    RADIOGRAPHIC STUDIES: CT  Biopsy  Result Date:  10/29/2020 INDICATION: 87 year old with indeterminate bone lesions. Tissue diagnosis is needed. EXAM: CT-guided biopsy of left iliac bone lesion MEDICATIONS: None. ANESTHESIA/SEDATION: Moderate (conscious) sedation was employed during this procedure. A total of Versed 2.0 mg and Fentanyl 100 mcg was administered intravenously. Moderate Sedation Time: 30 minutes. The patient's level of consciousness and vital signs were monitored continuously by radiology nursing throughout the procedure under my direct supervision. FLUOROSCOPY TIME:  None COMPLICATIONS: None immediate. PROCEDURE: Informed written consent was obtained from the patient after a thorough discussion of the procedural risks, benefits and alternatives. All questions were addressed. A timeout was performed prior to the initiation of the procedure. Patient was placed prone on the CT scanner. CT images through the pelvis were obtained. Lucent lesion along the medial aspect of the left iliac bone was identified and targeted. The overlying skin was prepped with chlorhexidine and sterile field was created. Skin and soft tissues were anesthetized using 1% lidocaine. Using CT guidance, an 11 gauge bone needle was directed onto the posterior left iliac bone cortex. Coaxial bone needle was directed through the 11 gauge needle into the lucent lesion. Total of 2 core biopsies were obtained in this area. Specimens placed in formalin. Needle was removed without complication. Bandage placed over the puncture site. FINDINGS: Subtle lucent lesion along the posterior left iliac bone which corresponds with the abnormalities on previous MRI. Needle position confirmed within the lesion. Two core biopsies were obtained. IMPRESSION: CT-guided core biopsies of left iliac bone lesion. Electronically Signed   By: Markus Daft [name]   On: 10/29/2020 11:16    ASSESSMENT & PLAN Martin Curry 87 y.o. male with medical history significant for bone lesions noted on CT/MRI who presents  for a follow up visit.  The patient underwent a biopsy which showed no evidence of malignancy.  Only showed normal bone tissue and normal bone marrow.  Discussed these findings with IR who recommended repeat imaging in 3 months time as they are certain there biopsy site was in the right location and did not think another biopsy would be necessary.  I discussed these findings with the patient he was in agreement that we could repeat imaging in 3 months time in order to reassess.  # Bone Lesions -- based on CT pelvis and MRI pelvis findings are concerning for metastatic disease. --biopsy shows no evidence of malignancy. Discuss with radiology who notes a repeat biopsy was not recommended and repeat imaging would be preferred.  --negative multiple myeloma labs with SPEP and serum free light chains showing no evidence of monoclonal gammopathy.  --Given his prior history of prostate cancer we ordered a PSA, which was normal. --CT C/A/P shows no evidence of disease elsewhere in the body.  --agree with IR recommendations, will repeat imaging in 3 months time.  --Return to clinic with imaging studies in 3 months.   Orders Placed This Encounter  Procedures   MR Pelvis W Wo Contrast    Standing Status:   Future    Standing Expiration Date:   11/12/2021    Order Specific Question:   If indicated for the ordered procedure, I authorize the administration of contrast media per Radiology protocol    Answer:   Yes    Order Specific Question:   What is the patient's sedation requirement?    Answer:   No Sedation    Order Specific Question:   Does the patient have a pacemaker or implanted devices?    Answer:  No    Order Specific Question:   Preferred imaging location?    Answer:   [facility] (table limit - 550 lbs)    All questions were answered. The patient knows to call the clinic with any problems, questions or concerns.  A total of more than 30 minutes were spent on this encounter with  face-to-face time and non-face-to-face time, including preparing to see the patient, ordering tests and/or medications, counseling the patient and coordination of care as outlined above.   Ledell Peoples, MD Department of Hematology/Oncology [place] at [facility] Phone: [phone] Pager: [phone] Email: [email or web address]  11/12/2020 11:22 AM

## 2020-12-10 DIAGNOSIS — Z23 Encounter for immunization: Secondary | ICD-10-CM | POA: Diagnosis not present

## 2020-12-22 ENCOUNTER — Other Ambulatory Visit: Payer: Self-pay | Admitting: Urology

## 2020-12-27 ENCOUNTER — Ambulatory Visit: Payer: PPO | Attending: Internal Medicine

## 2020-12-27 ENCOUNTER — Other Ambulatory Visit: Payer: Self-pay

## 2020-12-27 ENCOUNTER — Other Ambulatory Visit (HOSPITAL_BASED_OUTPATIENT_CLINIC_OR_DEPARTMENT_OTHER): Payer: Self-pay

## 2020-12-27 DIAGNOSIS — Z23 Encounter for immunization: Secondary | ICD-10-CM

## 2020-12-27 MED ORDER — PFIZER COVID-19 VAC BIVALENT 30 MCG/0.3ML IM SUSP
INTRAMUSCULAR | 0 refills | Status: DC
  Filled 2020-12-27: qty 0.3, 1d supply, fill #0

## 2020-12-27 NOTE — Progress Notes (Signed)
   Covid-19 Vaccination Clinic  Name:  Martin Curry    MRN: [id number] DOB: Jun 07, 1932  12/27/2020  Martin Curry was observed post Covid-19 immunization for 15 minutes without incident. He was provided with Vaccine Information Sheet and instruction to access the V-Safe system.   Martin Curry was instructed to call 911 with any severe reactions post vaccine: Difficulty breathing  Swelling of face and throat  A fast heartbeat  A bad rash all over body  Dizziness and weakness   Immunizations Administered     Name Date Dose VIS Date Route   Pfizer Covid-19 Vaccine Bivalent Booster 12/27/2020  1:51 PM 0.3 mL 10/23/2020 Intramuscular   Manufacturer: [place]   Lot: [id number]   [place]: [phone]

## 2020-12-31 DIAGNOSIS — Z87442 Personal history of urinary calculi: Secondary | ICD-10-CM | POA: Diagnosis not present

## 2020-12-31 DIAGNOSIS — N393 Stress incontinence (female) (male): Secondary | ICD-10-CM | POA: Diagnosis not present

## 2020-12-31 DIAGNOSIS — Z8546 Personal history of malignant neoplasm of prostate: Secondary | ICD-10-CM | POA: Diagnosis not present

## 2021-01-24 DIAGNOSIS — Z125 Encounter for screening for malignant neoplasm of prostate: Secondary | ICD-10-CM | POA: Diagnosis not present

## 2021-01-24 DIAGNOSIS — E785 Hyperlipidemia, unspecified: Secondary | ICD-10-CM | POA: Diagnosis not present

## 2021-01-24 DIAGNOSIS — E039 Hypothyroidism, unspecified: Secondary | ICD-10-CM | POA: Diagnosis not present

## 2021-01-24 DIAGNOSIS — I10 Essential (primary) hypertension: Secondary | ICD-10-CM | POA: Diagnosis not present

## 2021-01-31 DIAGNOSIS — E785 Hyperlipidemia, unspecified: Secondary | ICD-10-CM | POA: Diagnosis not present

## 2021-01-31 DIAGNOSIS — R82998 Other abnormal findings in urine: Secondary | ICD-10-CM | POA: Diagnosis not present

## 2021-01-31 DIAGNOSIS — C61 Malignant neoplasm of prostate: Secondary | ICD-10-CM | POA: Diagnosis not present

## 2021-01-31 DIAGNOSIS — Z1331 Encounter for screening for depression: Secondary | ICD-10-CM | POA: Diagnosis not present

## 2021-01-31 DIAGNOSIS — E039 Hypothyroidism, unspecified: Secondary | ICD-10-CM | POA: Diagnosis not present

## 2021-01-31 DIAGNOSIS — Z1339 Encounter for screening examination for other mental health and behavioral disorders: Secondary | ICD-10-CM | POA: Diagnosis not present

## 2021-01-31 DIAGNOSIS — I129 Hypertensive chronic kidney disease with stage 1 through stage 4 chronic kidney disease, or unspecified chronic kidney disease: Secondary | ICD-10-CM | POA: Diagnosis not present

## 2021-01-31 DIAGNOSIS — Z0000 Encounter for general adult medical examination without abnormal findings: Secondary | ICD-10-CM | POA: Diagnosis not present

## 2021-01-31 DIAGNOSIS — I700 Atherosclerosis of aorta: Secondary | ICD-10-CM | POA: Diagnosis not present

## 2021-01-31 DIAGNOSIS — N1831 Chronic kidney disease, stage 3a: Secondary | ICD-10-CM | POA: Diagnosis not present

## 2021-02-02 ENCOUNTER — Ambulatory Visit (HOSPITAL_COMMUNITY)
Admission: RE | Admit: 2021-02-02 | Discharge: 2021-02-02 | Disposition: A | Discharge status: Home / Self Care | Payer: PPO | Source: Ambulatory Visit | Attending: Hematology and Oncology | Admitting: Hematology and Oncology

## 2021-02-02 DIAGNOSIS — M899 Disorder of bone, unspecified: Secondary | ICD-10-CM | POA: Diagnosis not present

## 2021-02-02 DIAGNOSIS — C7951 Secondary malignant neoplasm of bone: Secondary | ICD-10-CM | POA: Diagnosis not present

## 2021-02-02 DIAGNOSIS — K5730 Diverticulosis of large intestine without perforation or abscess without bleeding: Secondary | ICD-10-CM | POA: Diagnosis not present

## 2021-02-02 DIAGNOSIS — C61 Malignant neoplasm of prostate: Secondary | ICD-10-CM | POA: Diagnosis not present

## 2021-02-02 MED ORDER — GADOBUTROL 1 MMOL/ML IV SOLN
9.0000 mL | Freq: Once | INTRAVENOUS | Status: AC | PRN
  Administered 2021-02-02: 9 mL via INTRAVENOUS

## 2021-02-06 ENCOUNTER — Other Ambulatory Visit: Payer: Self-pay

## 2021-02-06 ENCOUNTER — Other Ambulatory Visit: Payer: Self-pay | Admitting: Hematology and Oncology

## 2021-02-06 ENCOUNTER — Inpatient Hospital Stay: Payer: PPO | Attending: Hematology and Oncology

## 2021-02-06 ENCOUNTER — Encounter: Payer: Self-pay | Admitting: Hematology and Oncology

## 2021-02-06 ENCOUNTER — Inpatient Hospital Stay: Payer: PPO | Admitting: Hematology and Oncology

## 2021-02-06 VITALS — BP 135/78 | HR 63 | Temp 97.7°F | Resp 18 | Wt 178.1 lb

## 2021-02-06 DIAGNOSIS — M899 Disorder of bone, unspecified: Secondary | ICD-10-CM

## 2021-02-06 DIAGNOSIS — C61 Malignant neoplasm of prostate: Secondary | ICD-10-CM | POA: Insufficient documentation

## 2021-02-06 DIAGNOSIS — Z79899 Other long term (current) drug therapy: Secondary | ICD-10-CM | POA: Insufficient documentation

## 2021-02-06 LAB — CMP (CANCER CENTER ONLY)
ALT: 20 U/L (ref 0–44)
AST: 22 U/L (ref 15–41)
Albumin: 3.9 g/dL (ref 3.5–5.0)
Alkaline Phosphatase: 70 U/L (ref 38–126)
Anion gap: 8 (ref 5–15)
BUN: 16 mg/dL (ref 8–23)
CO2: 26 mmol/L (ref 22–32)
Calcium: 9.3 mg/dL (ref 8.9–10.3)
Chloride: 109 mmol/L (ref 98–111)
Creatinine: 1.3 mg/dL — ABNORMAL HIGH (ref 0.61–1.24)
GFR, Estimated: 54 mL/min — ABNORMAL LOW (ref >60)
Glucose, Bld: 98 mg/dL (ref 70–99)
Potassium: 4.1 mmol/L (ref 3.5–5.1)
Sodium: 143 mmol/L (ref 135–145)
Total Bilirubin: 0.5 mg/dL (ref 0.3–1.2)
Total Protein: 6.6 g/dL (ref 6.5–8.1)

## 2021-02-06 LAB — CBC WITH DIFFERENTIAL (CANCER CENTER ONLY)
Abs Immature Granulocytes: 0.01 10*3/uL (ref 0.00–0.07)
Basophils Absolute: 0 10*3/uL (ref 0.0–0.1)
Basophils Relative: 1 %
Eosinophils Absolute: 0.3 10*3/uL (ref 0.0–0.5)
Eosinophils Relative: 5 %
HCT: 44.4 % (ref 39.0–52.0)
Hemoglobin: 15.1 g/dL (ref 13.0–17.0)
Immature Granulocytes: 0 %
Lymphocytes Relative: 16 %
Lymphs Abs: 0.8 10*3/uL (ref 0.7–4.0)
MCH: 31.8 pg (ref 26.0–34.0)
MCHC: 34 g/dL (ref 30.0–36.0)
MCV: 93.5 fL (ref 80.0–100.0)
Monocytes Absolute: 0.6 10*3/uL (ref 0.1–1.0)
Monocytes Relative: 11 %
Neutro Abs: 3.6 10*3/uL (ref 1.7–7.7)
Neutrophils Relative %: 67 %
Platelet Count: 220 10*3/uL (ref 150–400)
RBC: 4.75 MIL/uL (ref 4.22–5.81)
RDW: 13.3 % (ref 11.5–15.5)
WBC Count: 5.3 10*3/uL (ref 4.0–10.5)
nRBC: 0 % (ref 0.0–0.2)

## 2021-02-08 NOTE — Progress Notes (Signed)
[facility] [phone]   [phone]  PROGRESS NOTE  Patient Care Team: Leanna Battles, MD as PCP - General (Internal Medicine) Leanna Battles, MD as Consulting Physician (Internal Medicine) Nobie Putnam, MD (Hematology and Oncology) Irine Seal, MD as Attending Physician (Urology) Sable Feil, MD as Attending Physician (Gastroenterology) Lavonna Monarch, MD as Consulting Physician (Dermatology) Eppie Gibson, MD as Attending Physician (Radiation Oncology) Leota Sauers, RN (Inactive) as Oncology Nurse Navigator Malmfelt, Stephani Police, RN as Oncology Nurse Navigator (Oncology)  Hematological/Oncological History # Bone Lesions on CT/MRI 08/30/2020: CT Pelvis W contrast showed 1.2 cm lucent lesion over the superior left iliac bone adjacent the sacroiliac joint corresponding to signal abnormality seen on MRI 09/19/2020: MRI pelvis WWO showed Marrow replacing lesions within the left iliac bone and sacrum. Additionally there was noted to be a new enhancing lesion at the tip of the right greater trochanter measuring up to 1.7 cm also suspicious for metastatic disease. 09/27/2020: establish care with Dr. Lorenso Courier 10/29/2020: CT biopsy performed of left iliac bone lesion. Patholoyg showed bone and marrow with evidence of carcinoma.   Interval History:  Martin Curry 87 y.o. male with medical history significant for bone lesions noted on CT/MRI who presents for a follow up visit. The patient's last visit was on 11/07/2020. In the interim since the last visit he completed an MRI pelvis which shows a stable lesion.   On exam today Martin Curry is unaccompanied.  He reports he has been well overall in the interim since her last visit.  He reports he is having considerably left hip pain as compared to his last visit.  He notes that as he is sitting the pain is 0 out of 10 in severity.  He notes with motion there is some increase in pain and he will describe it as  "uncomfortable".  His appetite has been good and he has had no additional bone or back pain elsewhere.  He denies any fevers, chills, sweats, nausea, vomiting or diarrhea.  His weight is been stable.  A full 10 point ROS is listed below.  MEDICAL HISTORY:  Past Medical History:  Diagnosis Date   Atypical nevus 09/07/2002   left abdomen-slight   BCC (basal cell carcinoma of skin) 07/19/2014   left sideburn- +margin-exc.   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   Nodular basal cell carcinoma (BCC) 07/16/2015   left scalp-CX35FU   Polycythemia    Prostate cancer ([facility]) 1990's   total prostatectomy and adjuvant radiation. Last PSA was 0.02 in 09/2011   SCC (squamous cell carcinoma) 04/03/2009   forehead-txpbx   SCC (squamous cell carcinoma) 07/16/2015   left front scalp   SCC (squamous cell carcinoma) 07/16/2015   left front scalp-Cx35FU   SCCA (squamous cell carcinoma) of skin 12/20/2019   Right forehead (in situ)  SCCA (squamous cell carcinoma) of skin 05/27/2020   Mid Parietal Scalp (in situ)   Superficial basal cell carcinoma (BCC) 11/21/2012   behind left ear-CX35FU, midback-CX35FU, right forehead-CX35FU-exc   Superficial basal cell carcinoma (BCC) 11/02/2017   left post neck-CX35FU   Superficial nodular basal cell carcinoma (BCC) 12/20/2019   Left temporal scalp   Superficial nodular basal cell  carcinoma (BCC) 05/27/2020   Right Preauricular area (curet and 5FU)    SURGICAL HISTORY: Past Surgical History:  Procedure Laterality Date   ADJACENT TISSUE TRANSFER/TISSUE REARRANGEMENT N/A 05/22/2019   Procedure: COMPLEX CLOSURE OF NECK WOUND;  Surgeon: Cindra Presume, MD;  Location: [facility];  Service: Plastics;  Laterality: N/A;   ALLOGRAFT APPLICATION N/A 05/17/4980   Procedure: POSSIBLE FACIAL NERVE RECONSTRUCTION WITH NERVE ALLOGRAFT VS AUTOGRAFT;  Surgeon: Cindra Presume, MD;  Location: [facility];  Service: Plastics;  Laterality: N/A;   BLADDER SURGERY     due to prostatectomy.    CATARACT EXTRACTION     right   COLONOSCOPY     neg in the past; due in 2014 with [facility] Dr. Sharlett Iles   EYE SURGERY     50 years ago.    HERNIA REPAIR     MOHS SURGERY     NECK DISSECTION  05/22/2019    NECK DISSECTION   PAROTIDECTOMY  05/22/2019   PAROTIDECTOMY with facial nerve dissection    PAROTIDECTOMY Right 05/22/2019   Procedure: PAROTIDECTOMY;  Surgeon: Izora Gala, MD;  Location: [facility];  Service: ENT;  Laterality: Right;   PROSTATE SURGERY     RADICAL NECK DISSECTION Right 05/22/2019   Procedure: RADICAL NECK DISSECTION;  Surgeon: Izora Gala, MD;  Location: [facility];  Service: ENT;  Laterality: Right;   TONSILLECTOMY      SOCIAL HISTORY: Social History   Socioeconomic History   Marital status: Married    Spouse name: Not on file   Number of children: 3   Years of education: Not on file   Highest education level: Not on file  Occupational History    Comment: retired IT consultant; 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  Tobacco Use   Smoking status: Never   Smokeless tobacco: Never  Vaping Use   Vaping Use: Never used  Substance and Sexual Activity   Alcohol use: No   Drug use: No   Sexual activity: Not on file  Other Topics Concern   Not on file  Social History Narrative   Not on file   Social Determinants of Health   Financial Resource Strain: Not on file  Food Insecurity: Not on file   Transportation Needs: Not on file  Physical Activity: Not on file  Stress: Not on file  Social Connections: Not on file  Intimate Partner Violence: Not on file    FAMILY HISTORY: Family History  Problem Relation Age of Onset   Uterine cancer Mother 51   Lung cancer Father 74   Prostate cancer Brother    Prostate cancer Brother     ALLERGIES:  is allergic to penicillins, latex, and tape.  MEDICATIONS:  Current Outpatient Medications  Medication Sig Dispense Refill   amLODipine (NORVASC) 5 MG tablet Take 5 mg by mouth daily.      aspirin 81 MG tablet Take 81 mg by mouth daily.     benazepril (LOTENSIN) 10 MG tablet Take 10 mg by mouth daily.     COVID-19 mRNA bivalent vaccine, Pfizer, (PFIZER COVID-19 VAC BIVALENT) injection Inject into the muscle. 0.3 mL 0  COVID-19 mRNA Vac-TriS, Pfizer, (PFIZER-BIONT COVID-19 VAC-TRIS) SUSP injection Inject into the muscle. 0.3 mL 0   famotidine-calcium carbonate-magnesium hydroxide (PEPCID COMPLETE) 10-800-165 MG chewable tablet Take one tablet PRN     levothyroxine (SYNTHROID) 25 MCG tablet Take 25 mcg by mouth daily before breakfast.     Multiple Vitamin (MULTIVITAMIN) tablet Take 1 tablet by mouth daily.     potassium citrate (UROCIT-K) 10 MEQ (1080 MG) SR tablet Take 10 mEq by mouth 3 (three) times daily with meals.     simvastatin (ZOCOR) 20 MG tablet Take 20 mg by mouth every evening.     No current facility-administered medications for this visit.    REVIEW OF SYSTEMS:   Constitutional: ( - ) fevers, ( - )  chills , ( - ) night sweats Eyes: ( - ) blurriness of vision, ( - ) double vision, ( - ) watery eyes Ears, nose, mouth, throat, and face: ( - ) mucositis, ( - ) sore throat Respiratory: ( - ) cough, ( - ) dyspnea, ( - ) wheezes Cardiovascular: ( - ) palpitation, ( - ) chest discomfort, ( - ) lower extremity swelling Gastrointestinal:  ( - ) nausea, ( - ) heartburn, ( - ) change in bowel habits Skin: ( - ) abnormal skin  rashes Lymphatics: ( - ) new lymphadenopathy, ( - ) easy bruising Neurological: ( - ) numbness, ( - ) tingling, ( - ) new weaknesses Behavioral/Psych: ( - ) mood change, ( - ) new changes  All other systems were reviewed with the patient and are negative.  PHYSICAL EXAMINATION: ECOG PERFORMANCE STATUS: 1 - Symptomatic but completely ambulatory  Vitals:   02/06/21 1030  BP: 135/78  Pulse: 63  Resp: 18  Temp: 97.7 F (36.5 C)  SpO2: 98%   Filed Weights   02/06/21 1030  Weight: 178 lb 1.6 oz (80.8 kg)    GENERAL: Well-appearing elderly Caucasian male, alert, no distress and comfortable SKIN: skin color, texture, turgor are normal, no rashes or significant lesions EYES: conjunctiva are pink and non-injected, sclera clear LUNGS: clear to auscultation and percussion with normal breathing effort HEART: regular rate & rhythm and no murmurs and no lower extremity edema Musculoskeletal: no cyanosis of digits and no clubbing  PSYCH: alert & oriented x 3, fluent speech NEURO: no focal motor/sensory deficits  LABORATORY DATA:  I have reviewed the data as listed CBC Latest Ref Rng & Units 02/06/2021 10/29/2020 09/27/2020  WBC 4.0 - 10.5 K/uL 5.3 6.6 6.9  Hemoglobin 13.0 - 17.0 g/dL 15.1 16.0 15.8  Hematocrit 39.0 - 52.0 % 44.4 46.2 45.3  Platelets 150 - 400 K/uL 220 237 217    CMP Latest Ref Rng & Units 02/06/2021 09/27/2020 05/16/2020  Glucose 70 - 99 mg/dL 98 104(H) 100(H)  BUN 8 - 23 mg/dL 16 21 15   Creatinine 0.61 - 1.24 mg/dL 1.30(H) 1.37(H) 1.40(H)  Sodium 135 - 145 mmol/L 143 142 143  Potassium 3.5 - 5.1 mmol/L 4.1 4.4 5.0  Chloride 98 - 111 mmol/L 109 106 107  CO2 22 - 32 mmol/L 26 27 30   Calcium 8.9 - 10.3 mg/dL 9.3 10.2 9.9  Total Protein 6.5 - 8.1 g/dL 6.6 7.1 6.9  Total Bilirubin 0.3 - 1.2 mg/dL 0.5 0.8 0.7  Alkaline Phos 38 - 126 U/L 70 80 75  AST 15 - 41 U/L 22 27 26   ALT 0 - 44 U/L 20 33 27    Lab Results  Component Value Date  MPROTEIN Not Observed 09/27/2020    Lab Results  Component Value Date   KPAFRELGTCHN 25.6 (H) 09/27/2020   LAMBDASER 23.0 09/27/2020   KAPLAMBRATIO 1.11 09/27/2020    RADIOGRAPHIC STUDIES: MR Pelvis W Wo Contrast  Result Date: 02/03/2021 CLINICAL DATA:  Prostate cancer with osseous metastatic disease EXAM: MRI PELVIS WITHOUT AND WITH CONTRAST TECHNIQUE: Multiplanar multisequence MR imaging of the pelvis was performed both before and after administration of intravenous contrast. CONTRAST:  49m GADAVIST GADOBUTROL 1 MMOL/ML IV SOLN COMPARISON:  MRI 09/18/2020 FINDINGS: Bones/Joint/Cartilage Minimal residual marrow edema at site of previously described subchondral fracture at the left femoral head posteriorly suggest partial healing. Otherwise, no acute fracture. No dislocation. No femoral head avascular necrosis. Previously seen enhancing lesion within the posterior left iliac bone is stable (series 10, image 9). Additional enhancing lesion within the sacrum at the S3 and S4 levels is also grossly unchanged (series 15, images 22-24). Previously described lesion at the tip of the right greater trochanter is less conspicuous on the current exam with only minimal signal change (series 10, image 6). This may have reflected reactive changes from right gluteus medius tendinopathy. No new marrow replacing bone lesion identified. Ligaments Grossly intact. Muscles and Tendons Tendinosis of the bilateral gluteus medius and minimus tendons. No acute musculotendinous abnormality. Soft tissues No mass lesion or abnormal enhancement is seen within the soft tissues. Slightly prominent bilateral inguinal lymph nodes are unchanged. Extensive diverticulosis throughout the visualized colon. IMPRESSION: 1. Stable enhancing metastatic lesions in the posterior left iliac bone and sacrum. 2. Previously described lesion at the tip of the right greater trochanter is less conspicuous on the current exam with only minimal signal change. This may have reflected  reactive changes from right gluteus medius tendinopathy. 3. No new marrow replacing bone lesion identified. 4. Minimal residual marrow edema at site of previously described subchondral fracture at the left femoral head posteriorly suggest partial healing. 5. Tendinosis of the bilateral gluteus medius and minimus tendons. Electronically Signed   By: NDavina PokeD.O.   On: 02/03/2021 09:08    ASSESSMENT & PLAN Martin HICKAM89y.o. male with medical history significant for bone lesions noted on CT/MRI who presents for a follow up visit.  The patient underwent a biopsy which showed no evidence of malignancy.  Only showed normal bone tissue and normal bone marrow.  Discussed these findings with IR who recommended repeat imaging in 3 months time as they are certain there biopsy site was in the right location and did not think another biopsy would be necessary.  I discussed these findings with the patient he was in agreement that we could repeat imaging in 6 months time in order to reassess.  # Bone Lesions -- based on CT pelvis and MRI pelvis findings are concerning for a stable lesion of unclear etiology.  --biopsy shows no evidence of malignancy. Discuss with radiology who notes a repeat biopsy was not recommended and repeat imaging would be preferred.  --negative multiple myeloma labs with SPEP and serum free light chains showing no evidence of monoclonal gammopathy.  --Given his prior history of prostate cancer we ordered a PSA, which was normal. --CT C/A/P shows no evidence of disease elsewhere in the body.  --Return to clinic with imaging studies in 6 months.   No orders of the defined types were placed in this encounter.   All questions were answered. The patient knows to call the clinic with any problems, questions or concerns.  A total  of more than 25 minutes were spent on this encounter with face-to-face time and non-face-to-face time, including preparing to see the patient, ordering  tests and/or medications, counseling the patient and coordination of care as outlined above.   Ledell Peoples, MD Department of Hematology/Oncology [facility] Phone: [phone] Pager: [phone] Email: [email or web address]  02/08/2021 5:23 PM

## 2021-04-01 DIAGNOSIS — Z85828 Personal history of other malignant neoplasm of skin: Secondary | ICD-10-CM | POA: Diagnosis not present

## 2021-04-01 DIAGNOSIS — H6981 Other specified disorders of Eustachian tube, right ear: Secondary | ICD-10-CM | POA: Diagnosis not present

## 2021-04-01 DIAGNOSIS — H6121 Impacted cerumen, right ear: Secondary | ICD-10-CM | POA: Diagnosis not present

## 2021-04-01 DIAGNOSIS — J300 Vasomotor rhinitis: Secondary | ICD-10-CM | POA: Diagnosis not present

## 2021-04-22 ENCOUNTER — Other Ambulatory Visit: Payer: Self-pay

## 2021-04-22 ENCOUNTER — Ambulatory Visit: Payer: Medicare Other | Admitting: Dermatology

## 2021-04-22 DIAGNOSIS — C44319 Basal cell carcinoma of skin of other parts of face: Secondary | ICD-10-CM | POA: Diagnosis not present

## 2021-04-22 DIAGNOSIS — L570 Actinic keratosis: Secondary | ICD-10-CM

## 2021-04-22 DIAGNOSIS — Z1283 Encounter for screening for malignant neoplasm of skin: Secondary | ICD-10-CM | POA: Diagnosis not present

## 2021-04-22 DIAGNOSIS — D485 Neoplasm of uncertain behavior of skin: Secondary | ICD-10-CM

## 2021-04-22 DIAGNOSIS — D044 Carcinoma in situ of skin of scalp and neck: Secondary | ICD-10-CM

## 2021-04-22 NOTE — Patient Instructions (Signed)
Instructions for Home Use of Dichloroacetic Acid ([place])   1. Keep your bottle upright in a very safe location with the lid secure.  2. After bathing, apply [place] with a toothpick or a "bald" cotton applicator. Apply a thin film, wait 30 seconds/until dry and cover with waterproof tape/the "sticky" part of the band-aid. Remove the tape/band-aid after 1 to 24 hours (gradually build up duration) and repeat 1-2 times weekly.  3. You may need to periodically remove the thick surface skin. Do this after bathing (before applying [place]) using a pumice stone or emery board or commercial rough skin remover. (REMEMBER to not use any of these on normal skin!!)  4. [place] is meant only for warts on the palms, fingers, and bottoms on feet. NEVER use it on any other areas.  5. This is a strong medicine. There is a risk of severe burn or scar.  6. We will recheck your progress in 1-2 months. Call the office if there are any questions or problems.

## 2021-04-23 ENCOUNTER — Encounter: Payer: Self-pay | Admitting: Dermatology

## 2021-04-28 ENCOUNTER — Telehealth: Payer: Self-pay | Admitting: *Deleted

## 2021-04-28 NOTE — Telephone Encounter (Signed)
Pathology to patient-surgery appointment scheduled.  

## 2021-04-28 NOTE — Telephone Encounter (Signed)
-----   Message from Lavonna Monarch, MD sent at 04/25/2021  5:15 AM EST ----- ?Schedule surgery with Dr. Darene Lamer ?

## 2021-04-29 DIAGNOSIS — H524 Presbyopia: Secondary | ICD-10-CM | POA: Diagnosis not present

## 2021-05-02 NOTE — Progress Notes (Signed)
Follow-Up Visit   Subjective  Martin Curry is a 88 y.o. male who presents for the following: Annual Exam (Crusty spot on scalp & left sideburn).  Waist up skin examination, several areas of concern Location:  Duration:  Quality:  Associated Signs/Symptoms: Modifying Factors:  Severity:  Timing: Context:   Objective  Well appearing patient in no apparent distress; mood and affect are within normal limits. left sideburn [place] 7 mm papule, probable BCC.  Lesion is near but not contiguous with superior scar.  I did mention this and discussed Mohs surgery, but understandably after his previous experience Martin Curry would strongly prefer to avoid that.       Mid Parietal Scalp Hornlike 3 mm pink crust  Left Parietal Scalp 1.2 cm waxy pink crust, superficial SCCA         All skin waist up examined.   Assessment & Plan    Neoplasm of uncertain behavior of skin left sideburn  Skin / nail biopsy Type of biopsy: tangential   Informed consent: discussed and consent obtained   Timeout: patient name, date of birth, surgical site, and procedure verified   Procedure prep:  Patient was prepped and draped in usual sterile fashion (Non sterile) Prep type:  Chlorhexidine Anesthesia: the lesion was anesthetized in a standard fashion   Anesthetic:  1% lidocaine w/ epinephrine 1-100,000 local infiltration Instrument used: flexible razor blade   Outcome: patient tolerated procedure well   Post-procedure details: wound care instructions given    Specimen 1 - Surgical pathology Differential Diagnosis: bcc vs scc  Check Margins: No  AK (actinic keratosis) Mid Parietal Scalp  Destruction of lesion - Mid Parietal Scalp Complexity: simple   Destruction method: cryotherapy   Informed consent: discussed and consent obtained   Timeout:  patient name, date of birth, surgical site, and procedure verified Lesion destroyed using liquid nitrogen: Yes   Cryotherapy cycles:   3 Outcome: patient tolerated procedure well with no complications    Carcinoma in situ of skin of scalp and neck Left Parietal Scalp  Skin / nail biopsy Type of biopsy: tangential   Informed consent: discussed and consent obtained   Timeout: patient name, date of birth, surgical site, and procedure verified   Procedure prep:  Patient was prepped and draped in usual sterile fashion (Non sterile) Prep type:  Chlorhexidine Anesthesia: the lesion was anesthetized in a standard fashion   Anesthetic:  1% lidocaine w/ epinephrine 1-100,000 local infiltration Instrument used: flexible razor blade   Outcome: patient tolerated procedure well   Post-procedure details: wound care instructions given    Destruction of lesion Complexity: simple   Destruction method: electrodesiccation and curettage   Informed consent: discussed and consent obtained   Timeout:  patient name, date of birth, surgical site, and procedure verified Anesthesia: the lesion was anesthetized in a standard fashion   Anesthetic:  1% lidocaine w/ epinephrine 1-100,000 local infiltration Curettage performed in three different directions: Yes   Curettage cycles:  3 Lesion length (cm):  1.2 Lesion width (cm):  1.2 Margin per side (cm):  0 Final wound size (cm):  1.2 Hemostasis achieved with:  aluminum chloride Outcome: patient tolerated procedure well with no complications   Post-procedure details: wound care instructions given    Specimen 2 - Surgical pathology Differential Diagnosis: bcc vs scc-txpbx  Check Margins: No  Shave biopsy the base was treated with curettage plus cautery      I, Lavonna Monarch, MD, have reviewed all documentation for this  visit.  The documentation on 05/02/21 for the exam, diagnosis, procedures, and orders are all accurate and complete.

## 2021-05-25 IMAGING — CT CT NECK W/ CM
4 of 5 series · 14 of 35 positions shown, 16 images · IV contrast (omnipaque)
Comparison: Neck CT 10/12/2019 and earlier.

CLINICAL DATA: 87-year-old male with history of treated
parotid/skin cancer. Restaging.

EXAM:
CT NECK WITH CONTRAST
TECHNIQUE: Multidetector CT imaging of the neck was performed using the
standard protocol following the bolus administration of intravenous
contrast.
CONTRAST:  75mL OMNIPAQUE IOHEXOL 300 MG/ML  SOLN

[Series 5: axial bone · axial · 0.47mm/px · z∈[+1183,+1307]mm · 3 of 124 slices shown]
[im 31/124  bone]
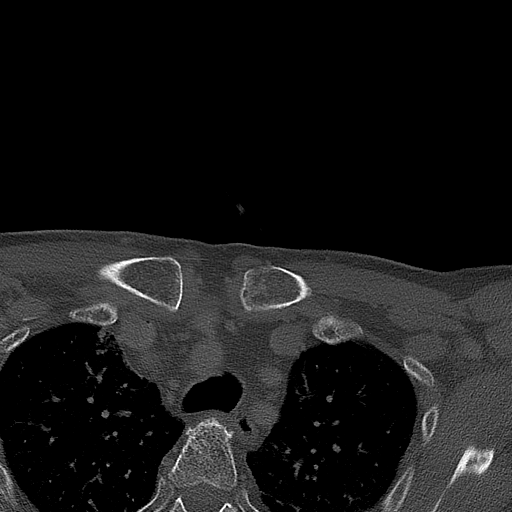
[im 62/124  bone]
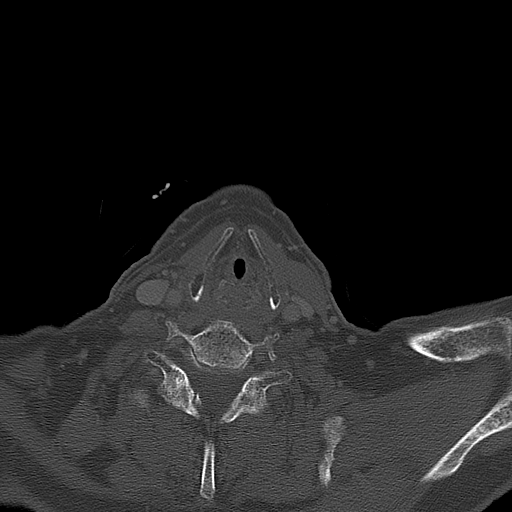
[im 93/124  bone]
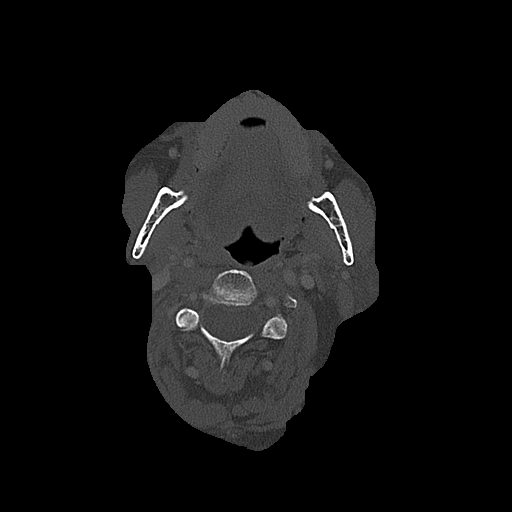

[Series 6: orthogonal (person_name) · axial · 0.39mm/px · z∈[+1185,+1318]mm · 3 of 135 slices shown, 4 images]
[im 34/135  soft-tissue]
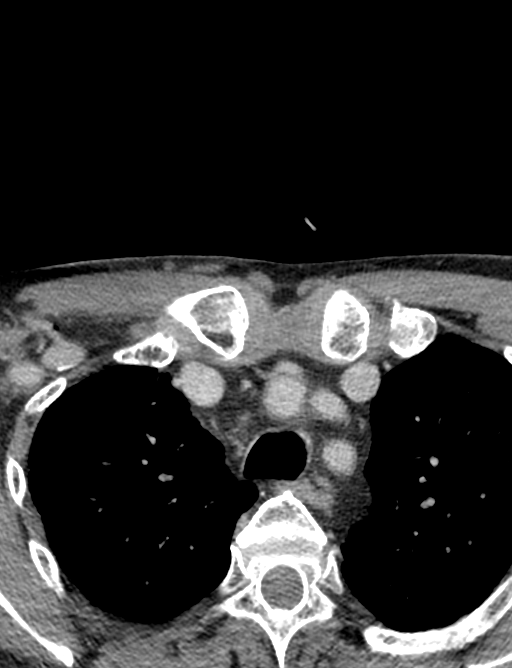
[im 34/135  bone]
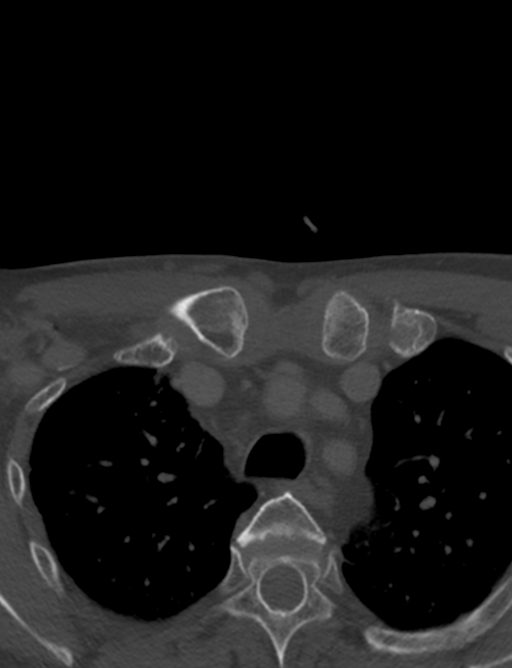
[im 68/135  bone]
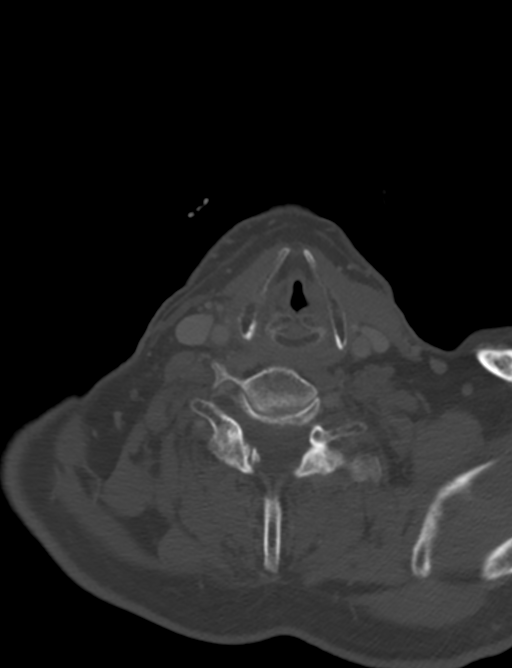
[im 101/135  bone]
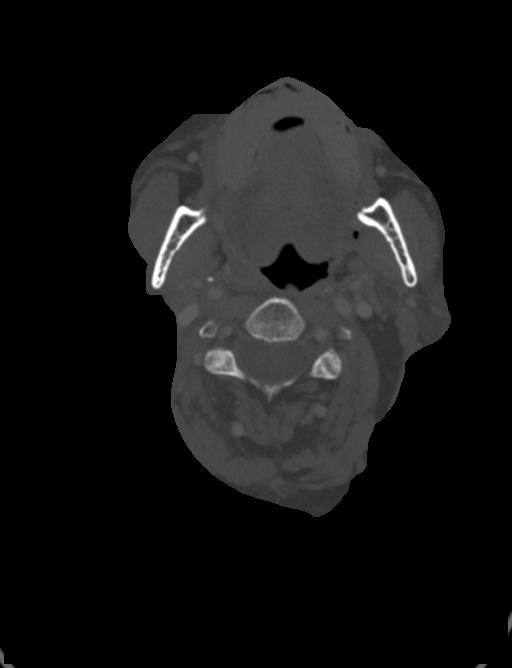

[Series 7: cor neck · coronal · 0.39mm/px · 3 of 108 slices shown]
[im 22/108  bone]
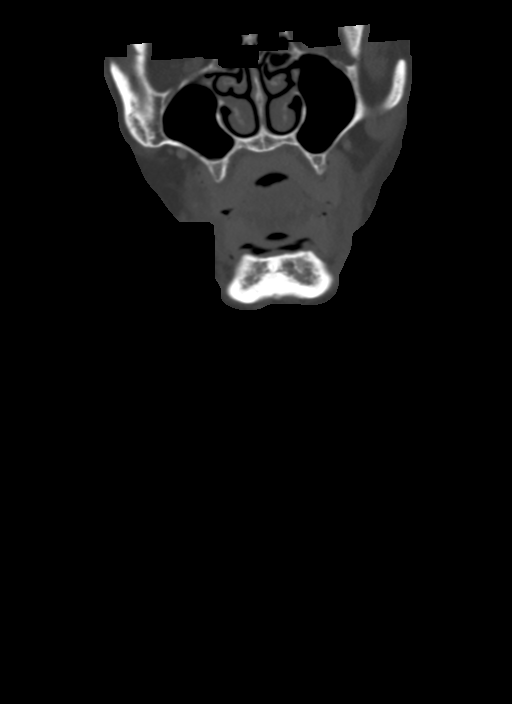
[im 43/108  bone]
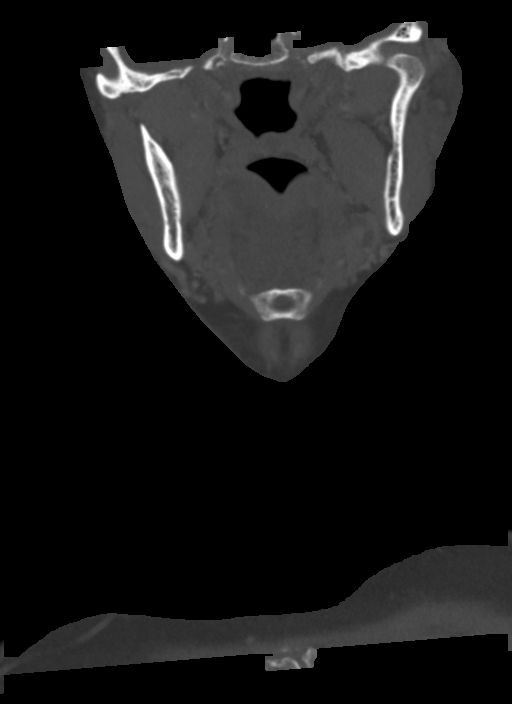
[im 65/108  bone]
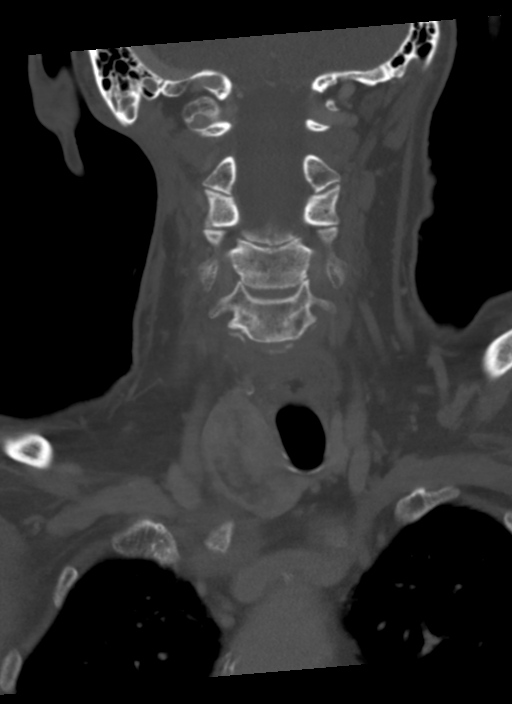

[Series 8: sag neck · sagittal · 0.52mm/px · 5 of 101 slices shown, 6 images]
[im 34/101  bone]
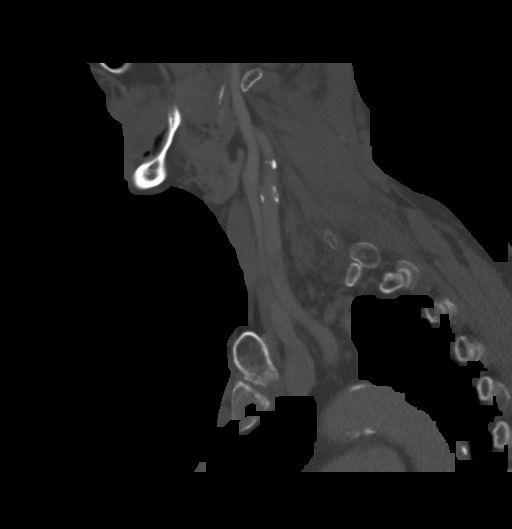
[im 42/101  bone]
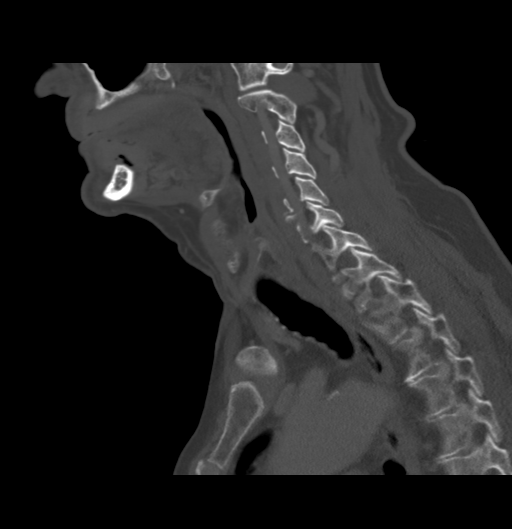
[im 51/101  soft-tissue]
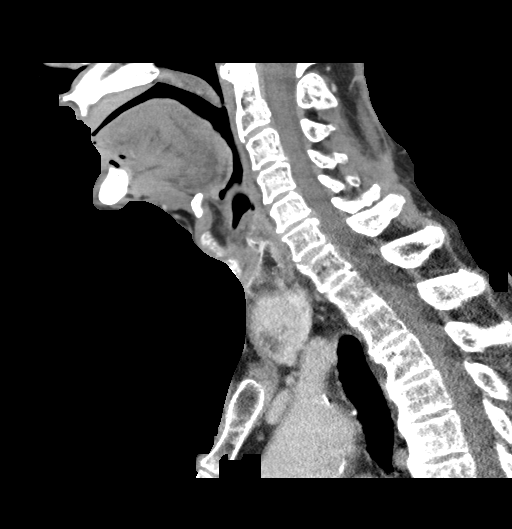
[im 51/101  bone]
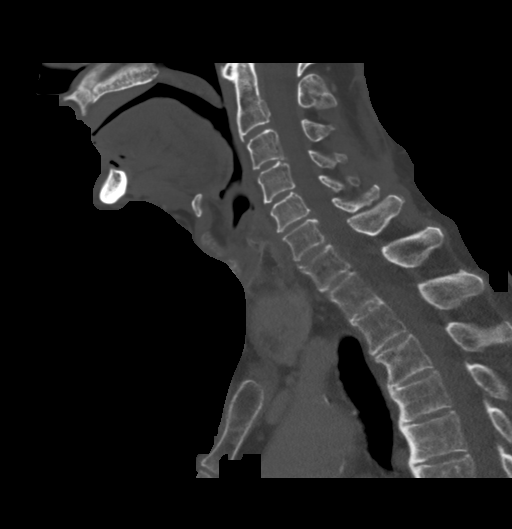
[im 59/101  bone]
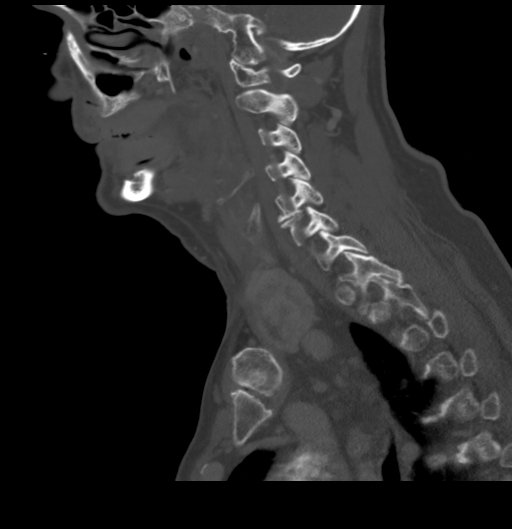
[im 67/101  bone]
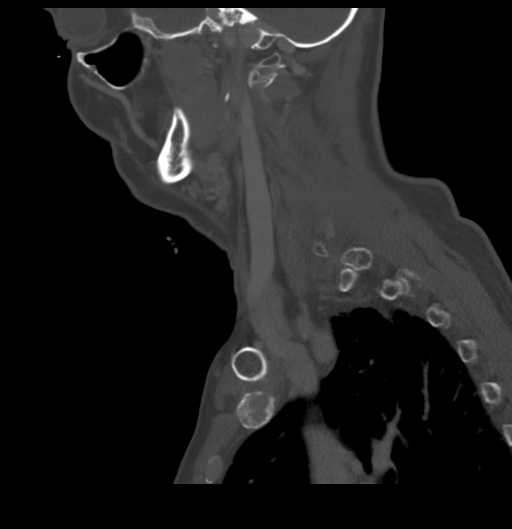

[14 of 35 positions shown; findings below may reference images not displayed]

FINDINGS: Pharynx and larynx: Laryngeal and pharyngeal soft tissue contours
are stable since [REDACTED]. Parapharyngeal and retropharyngeal spaces
remain negative.

Salivary glands: Negative sublingual space.

Stable submandibular glands, each partially atrophied and with
ductal ectasia more pronounced on the left.

Left parotid gland appears stable and within normal limits.

Sequelae of right parotidectomy. Regression of the mild nodular soft
tissue changes previously noted along the posteroinferior surgical
margin (series 3, image 46 today). Right stylomastoid foramen
appears stable, within normal limits. No masslike enhancement or
new/increased soft tissue identified in the region.

Thyroid: Stable thyroid goiter.

Lymph nodes: Postoperative and post radiation changes suspected on
the right side. No discrete right cervical lymph node can be
identified other than at the level 1 station (stable). Left side
cervical nodes remains small, normal. No cervical lymphadenopathy.

Vascular: Major vascular structures in the neck and at the skull
base remain patent.

Limited intracranial: Negative.

Visualized orbits: Stable, negative.

Mastoids and visualized paranasal sinuses: Regressed right mastoid
effusion, mild residual. Other visualized paranasal sinuses and
mastoids are stable and well pneumatized.

Skeleton: Absent dentition. Stable visualized osseous structures. No
acute or suspicious osseous lesion identified.

Upper chest: Stable mild apical lung scarring. Calcified aortic
atherosclerosis. Small partially calcified anterior carina
mediastinal lymph node is stable since [REDACTED] and appears
postinflammatory/post granulomatous (series 3, image 116). No
superior mediastinal lymphadenopathy.
IMPRESSION: 1. GABSE 1 - satisfactory post treatment appearance of the right
neck.
Small area of postoperative soft tissue nodularity has regressed
since [REDACTED], with no new or suspicious findings.

2. Regression of probable post radiation right mastoid effusion.

3. Aortic Atherosclerosis ([id number]).

## 2021-05-29 ENCOUNTER — Encounter: Payer: Medicare Other | Admitting: Dermatology

## 2021-06-03 ENCOUNTER — Ambulatory Visit (INDEPENDENT_AMBULATORY_CARE_PROVIDER_SITE_OTHER): Payer: Medicare Other | Admitting: Dermatology

## 2021-06-03 DIAGNOSIS — C44319 Basal cell carcinoma of skin of other parts of face: Secondary | ICD-10-CM | POA: Diagnosis not present

## 2021-06-03 DIAGNOSIS — C44310 Basal cell carcinoma of skin of unspecified parts of face: Secondary | ICD-10-CM

## 2021-06-03 NOTE — Patient Instructions (Signed)

## 2021-06-22 ENCOUNTER — Encounter: Payer: Self-pay | Admitting: Dermatology

## 2021-06-22 NOTE — Progress Notes (Signed)
? ?  Follow-Up Visit ?  ?Subjective  ?Martin Curry is a 88 y.o. male who presents for the following: Procedure (Here to treat left sideburn- superficial BCC). ? ?BCC left cheek ?Location:  ?Duration:  ?Quality:  ?Associated Signs/Symptoms: ?Modifying Factors:  ?Severity:  ?Timing: ?Context:  ? ?Objective  ?Well appearing patient in no apparent distress; mood and affect are within normal limits. ?left sideburn ?All treatment options reviewed with the patient.  He expressed strong desire to avoid future Mohs surgery. ? ? ? ?A focused examination was performed including head and neck. Relevant physical exam findings are noted in the Assessment and Plan. ? ? ?Assessment & Plan  ? ? ?Basal cell carcinoma, face ?left sideburn ? ?Destruction of lesion ?Complexity: simple   ?Destruction method: electrodesiccation and curettage   ?Informed consent: discussed and consent obtained   ?Timeout:  patient name, date of birth, surgical site, and procedure verified ?Anesthesia: the lesion was anesthetized in a standard fashion   ?Anesthetic:  1% lidocaine w/ epinephrine 1-100,000 local infiltration ?Curettage performed in three different directions: Yes   ?Electrodesiccation performed over the curetted area: Yes   ?Curettage cycles:  3 ?Lesion length (cm):  1.3 ?Lesion width (cm):  1.3 ?Margin per side (cm):  0 ?Final wound size (cm):  1.3 ?Hemostasis achieved with:  ferric subsulfate ?Outcome: patient tolerated procedure well with no complications   ?Post-procedure details: wound care instructions given   ?Additional details:  Wound inoculated with 5% fluorouracil solution ? ? ? ? ? ?I, Lavonna Monarch, MD, have reviewed all documentation for this visit.  The documentation on 06/22/21 for the exam, diagnosis, procedures, and orders are all accurate and complete. ?

## 2021-07-28 DIAGNOSIS — Z0000 Encounter for general adult medical examination without abnormal findings: Secondary | ICD-10-CM | POA: Diagnosis not present

## 2021-07-28 DIAGNOSIS — N1831 Chronic kidney disease, stage 3a: Secondary | ICD-10-CM | POA: Diagnosis not present

## 2021-07-28 DIAGNOSIS — I129 Hypertensive chronic kidney disease with stage 1 through stage 4 chronic kidney disease, or unspecified chronic kidney disease: Secondary | ICD-10-CM | POA: Diagnosis not present

## 2021-07-28 DIAGNOSIS — E039 Hypothyroidism, unspecified: Secondary | ICD-10-CM | POA: Diagnosis not present

## 2021-08-03 ENCOUNTER — Ambulatory Visit (HOSPITAL_COMMUNITY)
Admission: RE | Admit: 2021-08-03 | Discharge: 2021-08-03 | Disposition: A | Discharge status: Home / Self Care | Payer: Medicare Other | Source: Ambulatory Visit | Attending: Hematology and Oncology | Admitting: Hematology and Oncology

## 2021-08-03 DIAGNOSIS — K5730 Diverticulosis of large intestine without perforation or abscess without bleeding: Secondary | ICD-10-CM | POA: Diagnosis not present

## 2021-08-03 DIAGNOSIS — R600 Localized edema: Secondary | ICD-10-CM | POA: Diagnosis not present

## 2021-08-03 DIAGNOSIS — M899 Disorder of bone, unspecified: Secondary | ICD-10-CM | POA: Insufficient documentation

## 2021-08-03 DIAGNOSIS — C61 Malignant neoplasm of prostate: Secondary | ICD-10-CM | POA: Diagnosis not present

## 2021-08-03 DIAGNOSIS — C7951 Secondary malignant neoplasm of bone: Secondary | ICD-10-CM | POA: Diagnosis not present

## 2021-08-03 MED ORDER — GADOBUTROL 1 MMOL/ML IV SOLN
8.0000 mL | Freq: Once | INTRAVENOUS | Status: AC | PRN
  Administered 2021-08-03: 8 mL via INTRAVENOUS

## 2021-08-06 ENCOUNTER — Other Ambulatory Visit: Payer: Self-pay | Admitting: Hematology and Oncology

## 2021-08-06 ENCOUNTER — Inpatient Hospital Stay: Payer: Medicare Other | Attending: Hematology and Oncology

## 2021-08-06 ENCOUNTER — Other Ambulatory Visit: Payer: Self-pay

## 2021-08-06 ENCOUNTER — Inpatient Hospital Stay (HOSPITAL_BASED_OUTPATIENT_CLINIC_OR_DEPARTMENT_OTHER): Payer: Medicare Other | Admitting: Hematology and Oncology

## 2021-08-06 VITALS — BP 124/72 | HR 68 | Temp 97.9°F | Resp 15 | Wt 183.4 lb

## 2021-08-06 DIAGNOSIS — Z79890 Hormone replacement therapy (postmenopausal): Secondary | ICD-10-CM | POA: Diagnosis not present

## 2021-08-06 DIAGNOSIS — Z923 Personal history of irradiation: Secondary | ICD-10-CM | POA: Diagnosis not present

## 2021-08-06 DIAGNOSIS — Z880 Allergy status to penicillin: Secondary | ICD-10-CM | POA: Diagnosis not present

## 2021-08-06 DIAGNOSIS — Z85828 Personal history of other malignant neoplasm of skin: Secondary | ICD-10-CM | POA: Insufficient documentation

## 2021-08-06 DIAGNOSIS — Z9079 Acquired absence of other genital organ(s): Secondary | ICD-10-CM | POA: Insufficient documentation

## 2021-08-06 DIAGNOSIS — Z8042 Family history of malignant neoplasm of prostate: Secondary | ICD-10-CM | POA: Insufficient documentation

## 2021-08-06 DIAGNOSIS — Z888 Allergy status to other drugs, medicaments and biological substances status: Secondary | ICD-10-CM | POA: Diagnosis not present

## 2021-08-06 DIAGNOSIS — M25559 Pain in unspecified hip: Secondary | ICD-10-CM | POA: Insufficient documentation

## 2021-08-06 DIAGNOSIS — Z8546 Personal history of malignant neoplasm of prostate: Secondary | ICD-10-CM | POA: Insufficient documentation

## 2021-08-06 DIAGNOSIS — M899 Disorder of bone, unspecified: Secondary | ICD-10-CM

## 2021-08-06 DIAGNOSIS — Z87442 Personal history of urinary calculi: Secondary | ICD-10-CM | POA: Insufficient documentation

## 2021-08-06 DIAGNOSIS — Z801 Family history of malignant neoplasm of trachea, bronchus and lung: Secondary | ICD-10-CM | POA: Diagnosis not present

## 2021-08-06 DIAGNOSIS — Z79899 Other long term (current) drug therapy: Secondary | ICD-10-CM | POA: Insufficient documentation

## 2021-08-06 DIAGNOSIS — Z8049 Family history of malignant neoplasm of other genital organs: Secondary | ICD-10-CM | POA: Insufficient documentation

## 2021-08-06 LAB — CMP (CANCER CENTER ONLY)
ALT: 19 U/L (ref 0–44)
AST: 19 U/L (ref 15–41)
Albumin: 4.3 g/dL (ref 3.5–5.0)
Alkaline Phosphatase: 76 U/L (ref 38–126)
Anion gap: 5 (ref 5–15)
BUN: 19 mg/dL (ref 8–23)
CO2: 30 mmol/L (ref 22–32)
Calcium: 10.1 mg/dL (ref 8.9–10.3)
Chloride: 108 mmol/L (ref 98–111)
Creatinine: 1.41 mg/dL — ABNORMAL HIGH (ref 0.61–1.24)
GFR, Estimated: 49 mL/min — ABNORMAL LOW (ref >60)
Glucose, Bld: 124 mg/dL — ABNORMAL HIGH (ref 70–99)
Potassium: 4.5 mmol/L (ref 3.5–5.1)
Sodium: 143 mmol/L (ref 135–145)
Total Bilirubin: 0.6 mg/dL (ref 0.3–1.2)
Total Protein: 7 g/dL (ref 6.5–8.1)

## 2021-08-06 LAB — CBC WITH DIFFERENTIAL (CANCER CENTER ONLY)
Abs Immature Granulocytes: 0.04 10*3/uL (ref 0.00–0.07)
Basophils Absolute: 0 10*3/uL (ref 0.0–0.1)
Basophils Relative: 0 %
Eosinophils Absolute: 0.3 10*3/uL (ref 0.0–0.5)
Eosinophils Relative: 4 %
HCT: 46.3 % (ref 39.0–52.0)
Hemoglobin: 15.9 g/dL (ref 13.0–17.0)
Immature Granulocytes: 1 %
Lymphocytes Relative: 16 %
Lymphs Abs: 1.2 10*3/uL (ref 0.7–4.0)
MCH: 32.3 pg (ref 26.0–34.0)
MCHC: 34.3 g/dL (ref 30.0–36.0)
MCV: 93.9 fL (ref 80.0–100.0)
Monocytes Absolute: 0.6 10*3/uL (ref 0.1–1.0)
Monocytes Relative: 8 %
Neutro Abs: 5.1 10*3/uL (ref 1.7–7.7)
Neutrophils Relative %: 71 %
Platelet Count: 234 10*3/uL (ref 150–400)
RBC: 4.93 MIL/uL (ref 4.22–5.81)
RDW: 13.2 % (ref 11.5–15.5)
WBC Count: 7.1 10*3/uL (ref 4.0–10.5)
nRBC: 0 % (ref 0.0–0.2)

## 2021-08-06 NOTE — Progress Notes (Signed)
[place] [phone]   [phone]  PROGRESS NOTE  Patient Care Team: Donnajean Lopes, MD as PCP - General (Internal Medicine) Donnajean Lopes, MD as Consulting Physician (Internal Medicine) Nobie Putnam, MD (Hematology and Oncology) Irine Seal, MD as Attending Physician (Urology) Sable Feil, MD as Attending Physician (Gastroenterology) Lavonna Monarch, MD as Consulting Physician (Dermatology) Eppie Gibson, MD as Attending Physician (Radiation Oncology) Leota Sauers, RN (Inactive) as Oncology Nurse Navigator Malmfelt, Stephani Police, RN as Oncology Nurse Navigator (Oncology)  Hematological/Oncological History # Bone Lesions on CT/MRI 08/30/2020: CT Pelvis W contrast showed 1.2 cm lucent lesion over the superior left iliac bone adjacent the sacroiliac joint corresponding to signal abnormality seen on MRI 09/19/2020: MRI pelvis WWO showed Marrow replacing lesions within the left iliac bone and sacrum. Additionally there was noted to be a new enhancing lesion at the tip of the right greater trochanter measuring up to 1.7 cm also suspicious for metastatic disease. 09/27/2020: establish care with Dr. Lorenso Courier 10/29/2020: CT biopsy performed of left iliac bone lesion. Patholoyg showed bone and marrow with evidence of carcinoma.   Interval History:  Martin Curry 88 y.o. male with medical history significant for bone lesions noted on CT/MRI who presents for a follow up visit. The patient's last visit was on 02/06/2021. In the interim since the last visit he completed an MRI pelvis on 08/05/2021 which shows a stable lesion.   On exam today Martin Curry is unaccompanied.  He reports he has had no major changes in his health in the interim since her last visit.  He notes he read the MRI report but was not able to understand completely.  He reports overall his hip is feeling better on the left side.  He notes that the hip pain can be up to 1 out of 10 in  severity and right now it is 0 out of 10.  He notes that it is very modest and it does not cause him any disruption in his day-to-day life.  His weight is increasing and is up to 183 pounds from 176 pounds in September 2022.  He denies any fevers, chills, sweats, nausea, vomiting or diarrhea.  His weight is been stable.  A full 10 point ROS is listed below.  MEDICAL HISTORY:  Past Medical History:  Diagnosis Date   Atypical nevus 09/07/2002   left abdomen-slight   BCC (basal cell carcinoma of skin) 07/19/2014   left sideburn- +margin-exc.   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   Nodular basal cell carcinoma (BCC) 07/16/2015   left scalp-CX35FU   Polycythemia    Prostate cancer ([place]) 1990's   total prostatectomy and adjuvant radiation. Last PSA was 0.02 in 09/2011   SCC (squamous cell carcinoma) 04/03/2009   forehead-txpbx   SCC (squamous cell carcinoma) 07/16/2015  left front scalp   SCC (squamous cell carcinoma) 07/16/2015   left front scalp-Cx35FU   SCCA (squamous cell carcinoma) of skin 12/20/2019   Right forehead (in situ)   SCCA (squamous cell carcinoma) of skin 05/27/2020   Mid Parietal Scalp (in situ)   Superficial basal cell carcinoma (BCC) 11/21/2012   behind left ear-CX35FU, midback-CX35FU, right forehead-CX35FU-exc   Superficial basal cell carcinoma (BCC) 11/02/2017   left post neck-CX35FU    Superficial nodular basal cell carcinoma (BCC) 12/20/2019   Left temporal scalp   Superficial nodular basal cell carcinoma (BCC) 05/27/2020   Right Preauricular area (curet and 5FU)    SURGICAL HISTORY: Past Surgical History:  Procedure Laterality Date   ADJACENT TISSUE TRANSFER/TISSUE REARRANGEMENT N/A 05/22/2019   Procedure: COMPLEX CLOSURE OF NECK WOUND;  Surgeon: Cindra Presume, MD;  Location: [facility];  Service: Plastics;  Laterality: N/A;   ALLOGRAFT APPLICATION N/A 1/61/0960   Procedure: POSSIBLE FACIAL NERVE RECONSTRUCTION WITH NERVE ALLOGRAFT VS AUTOGRAFT;  Surgeon: Cindra Presume, MD;  Location: [facility];  Service: Plastics;  Laterality: N/A;   BLADDER SURGERY     due to prostatectomy.    CATARACT EXTRACTION     right   COLONOSCOPY     neg in the past; due in 2014 with  Dr. Sharlett Iles   EYE SURGERY     50 years ago.    HERNIA REPAIR     MOHS SURGERY     NECK DISSECTION  05/22/2019    NECK DISSECTION   PAROTIDECTOMY  05/22/2019   PAROTIDECTOMY with facial nerve dissection    PAROTIDECTOMY Right 05/22/2019   Procedure: PAROTIDECTOMY;  Surgeon: Izora Gala, MD;  Location: [facility];  Service: ENT;  Laterality: Right;   PROSTATE SURGERY     RADICAL NECK DISSECTION Right 05/22/2019   Procedure: RADICAL NECK DISSECTION;  Surgeon: Izora Gala, MD;  Location: [facility];  Service: ENT;  Laterality: Right;   TONSILLECTOMY      SOCIAL HISTORY: Social History   Socioeconomic History   Marital status: Married    Spouse name: Not on file   Number of children: 3   Years of education: Not on file   Highest education level: Not on file  Occupational History    Comment: retired IT consultant; 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  Tobacco Use   Smoking status: Never   Smokeless tobacco: Never  Vaping Use   Vaping Use: Never used  Substance and Sexual Activity   Alcohol use: No   Drug use: No   Sexual activity: Not on file  Other Topics Concern   Not on file  Social History Narrative   Not  on file   Social Determinants of Health   Financial Resource Strain: Not on file  Food Insecurity: Not on file  Transportation Needs: Not on file  Physical Activity: Not on file  Stress: Not on file  Social Connections: Not on file  Intimate Partner Violence: Not on file    FAMILY HISTORY: Family History  Problem Relation Age of Onset   Uterine cancer Mother 9   Lung cancer Father 81   Prostate cancer Brother    Prostate cancer Brother     ALLERGIES:  is allergic to methylprednisolone, penicillins, latex, and tape.  MEDICATIONS:  Current Outpatient Medications  Medication Sig Dispense Refill   amLODipine (NORVASC) 5 MG tablet Take 5 mg by mouth daily.      aspirin 81 MG tablet Take 81 mg by mouth daily.     benazepril (  LOTENSIN) 10 MG tablet Take 10 mg by mouth daily.     COVID-19 mRNA bivalent vaccine, Pfizer, (PFIZER COVID-19 VAC BIVALENT) injection Inject into the muscle. 0.3 mL 0   famotidine-calcium carbonate-magnesium hydroxide (PEPCID COMPLETE) 10-800-165 MG chewable tablet Take one tablet PRN     levothyroxine (SYNTHROID) 25 MCG tablet Take 25 mcg by mouth daily before breakfast.     Multiple Vitamin (MULTIVITAMIN) tablet Take 1 tablet by mouth daily.     potassium citrate (UROCIT-K) 10 MEQ (1080 MG) SR tablet Take 10 mEq by mouth 3 (three) times daily with meals.     simvastatin (ZOCOR) 20 MG tablet Take 20 mg by mouth every evening.     No current facility-administered medications for this visit.    REVIEW OF SYSTEMS:   Constitutional: ( - ) fevers, ( - )  chills , ( - ) night sweats Eyes: ( - ) blurriness of vision, ( - ) double vision, ( - ) watery eyes Ears, nose, mouth, throat, and face: ( - ) mucositis, ( - ) sore throat Respiratory: ( - ) cough, ( - ) dyspnea, ( - ) wheezes Cardiovascular: ( - ) palpitation, ( - ) chest discomfort, ( - ) lower extremity swelling Gastrointestinal:  ( - ) nausea, ( - ) heartburn, ( - ) change in bowel habits Skin: ( - )  abnormal skin rashes Lymphatics: ( - ) new lymphadenopathy, ( - ) easy bruising Neurological: ( - ) numbness, ( - ) tingling, ( - ) new weaknesses Behavioral/Psych: ( - ) mood change, ( - ) new changes  All other systems were reviewed with the patient and are negative.  PHYSICAL EXAMINATION: ECOG PERFORMANCE STATUS: 1 - Symptomatic but completely ambulatory  Vitals:   08/06/21 0942  BP: 124/72  Pulse: 68  Resp: 15  Temp: 97.9 F (36.6 C)  SpO2: 94%   Filed Weights   08/06/21 0942  Weight: 183 lb 6.4 oz (83.2 kg)    GENERAL: Well-appearing elderly Caucasian male, alert, no distress and comfortable SKIN: skin color, texture, turgor are normal, no rashes or significant lesions EYES: conjunctiva are pink and non-injected, sclera clear LUNGS: clear to auscultation and percussion with normal breathing effort HEART: regular rate & rhythm and no murmurs and no lower extremity edema Musculoskeletal: no cyanosis of digits and no clubbing  PSYCH: alert & oriented x 3, fluent speech NEURO: no focal motor/sensory deficits  LABORATORY DATA:  I have reviewed the data as listed    Latest Ref Rng & Units 08/06/2021    9:25 AM 02/06/2021    9:37 AM 10/29/2020    7:30 AM  CBC  WBC 4.0 - 10.5 K/uL 7.1  5.3  6.6   Hemoglobin 13.0 - 17.0 g/dL 15.9  15.1  16.0   Hematocrit 39.0 - 52.0 % 46.3  44.4  46.2   Platelets 150 - 400 K/uL 234  220  237        Latest Ref Rng & Units 08/06/2021    9:25 AM 02/06/2021    9:37 AM 09/27/2020    9:50 AM  CMP  Glucose 70 - 99 mg/dL 124  98  104   BUN 8 - 23 mg/dL 19  16  21    Creatinine 0.61 - 1.24 mg/dL 1.41  1.30  1.37   Sodium 135 - 145 mmol/L 143  143  142   Potassium 3.5 - 5.1 mmol/L 4.5  4.1  4.4   Chloride 98 - 111 mmol/L  108  109  106   CO2 22 - 32 mmol/L 30  26  27    Calcium 8.9 - 10.3 mg/dL 10.1  9.3  10.2   Total Protein 6.5 - 8.1 g/dL 7.0  6.6  7.1   Total Bilirubin 0.3 - 1.2 mg/dL 0.6  0.5  0.8   Alkaline Phos 38 - 126 U/L 76  70  80    AST 15 - 41 U/L 19  22  27    ALT 0 - 44 U/L 19  20  33     Lab Results  Component Value Date   MPROTEIN Not Observed 09/27/2020   Lab Results  Component Value Date   KPAFRELGTCHN 25.6 (H) 09/27/2020   LAMBDASER 23.0 09/27/2020   KAPLAMBRATIO 1.11 09/27/2020    RADIOGRAPHIC STUDIES: MR Pelvis W Wo Contrast  Result Date: 08/05/2021 CLINICAL DATA:  Prostate cancer, osseous metastatic disease. EXAM: MRI PELVIS WITHOUT AND WITH CONTRAST TECHNIQUE: Multiplanar multisequence MR imaging of the pelvis was performed both before and after administration of intravenous contrast. CONTRAST:  81m GADAVIST GADOBUTROL 1 MMOL/ML IV SOLN COMPARISON:  Multiple exams, including 02/02/2021 FINDINGS: Osseous structures and joints: 1.5 by 0.9 cm enhancing left iliac bone lesion on image 10 series 10, formerly 1.5 by 1.1 cm on 02/02/2021. Sacral lesion involving the S3 and S4 segments measuring 3.9 by 1.6 by 4.4 cm demonstrates enhancement and accentuated T2 signal. This previously measured 3.7 by 1.6 by 4.1 cm by my measurements. A small enhancing lesion of the right greater trochanter posteriorly measures 1.3 by 0.9 cm on image 34 series 15, formerly 1.2 by 1.0 cm. No new bony lesions are identified.  No joint effusion. On 09/18/2020, there was a posterior left femoral heads subcortical osteochondral lesion, this is mostly resolved with only a small residual focus of subcortical edema at the site of this osteochondral lesion on image 34 series 12. Musculotendinous: Unremarkable Other: Prostatectomy. Sigmoid colon diverticulosis. Small bilateral inguinal lymph nodes are not pathologically enlarged. IMPRESSION: 1. The two enhancing lesions in the bony pelvis and single enhancing lesion in the right greater trochanter are not substantially changed from prior and compatible with previous metastatic foci. No new lesions identified. 2. Sigmoid colon diverticulosis. Electronically Signed   By: WVan ClinesM.D.   On:  08/05/2021 08:11    ASSESSMENT & PLAN Martin LEVITT89y.o. male with medical history significant for bone lesions noted on CT/MRI who presents for a follow up visit.  The patient underwent a biopsy which showed no evidence of malignancy.  Only showed normal bone tissue and normal bone marrow.  Discussed these findings with IR who recommended repeat imaging in 3 months time as they are certain there biopsy site was in the right location and did not think another biopsy would be necessary.  I discussed these findings with the patient he was in agreement that we could repeat imaging in 6 months time in order to reassess.  # Bone Lesions -- based on CT pelvis and MRI pelvis findings are concerning for a stable lesion of unclear etiology.  --biopsy shows no evidence of malignancy. Discuss with radiology who notes a repeat biopsy was not recommended and repeat imaging would be preferred.  --negative multiple myeloma labs with SPEP and serum free light chains showing no evidence of monoclonal gammopathy.  --Given his prior history of prostate cancer we ordered a PSA, which was normal. Repeated PSA today.  --CT C/A/P shows no evidence of disease elsewhere in the  body.  --Return to clinic with imaging studies in 6 months. If lesions are stable consider d/c to PCP at that time.   No orders of the defined types were placed in this encounter.   All questions were answered. The patient knows to call the clinic with any problems, questions or concerns.  A total of more than 25 minutes were spent on this encounter with face-to-face time and non-face-to-face time, including preparing to see the patient, ordering tests and/or medications, counseling the patient and coordination of care as outlined above.   Ledell Peoples, MD Department of Hematology/Oncology [facility] Phone: [phone] Pager: [phone] Email: [email or web address]  08/07/2021 9:09 AM

## 2021-08-07 LAB — PROSTATE-SPECIFIC AG, SERUM (LABCORP): Prostate Specific Ag, Serum: <0.1 ng/mL (ref 0.0–4.0)

## 2021-09-02 ENCOUNTER — Encounter: Payer: Self-pay | Admitting: Dermatology

## 2021-09-02 ENCOUNTER — Ambulatory Visit: Payer: Medicare Other | Admitting: Dermatology

## 2021-09-02 DIAGNOSIS — L820 Inflamed seborrheic keratosis: Secondary | ICD-10-CM

## 2021-09-02 DIAGNOSIS — Z1283 Encounter for screening for malignant neoplasm of skin: Secondary | ICD-10-CM | POA: Diagnosis not present

## 2021-09-02 DIAGNOSIS — L570 Actinic keratosis: Secondary | ICD-10-CM | POA: Diagnosis not present

## 2021-09-02 DIAGNOSIS — D485 Neoplasm of uncertain behavior of skin: Secondary | ICD-10-CM

## 2021-09-02 NOTE — Patient Instructions (Signed)

## 2021-09-23 ENCOUNTER — Encounter: Payer: Self-pay | Admitting: Dermatology

## 2021-09-23 NOTE — Progress Notes (Signed)
   Follow-Up Visit   Subjective  Martin Curry is a 88 y.o. male who presents for the following: Annual Exam (No new concerns).  Annual skin examination, several crusts he would like checked Location:  Duration:  Quality:  Associated Signs/Symptoms: Modifying Factors:  Severity:  Timing: Context:   Objective  Well appearing patient in no apparent distress; mood and affect are within normal limits. Waist up skin examination, no atypical pigmented lesions (all checked with dermoscopy).  No recurrent head and neck cancer, no adenopathy.  1 possible new superficial carcinoma right scalp to be biopsied and treated.  Right Frontal Scalp Waxy pink-tan 1.2 cm crust possible new superficial carcinoma       Left Temple, Mid Parietal Scalp 2 hornlike 3 mm pink crusts    All skin waist up examined.   Assessment & Plan    Neoplasm of uncertain behavior of skin Right Frontal Scalp  Skin / nail biopsy Type of biopsy: tangential   Informed consent: discussed and consent obtained   Timeout: patient name, date of birth, surgical site, and procedure verified   Anesthesia: the lesion was anesthetized in a standard fashion   Anesthetic:  1% lidocaine w/ epinephrine 1-100,000 local infiltration Instrument used: flexible razor blade   Hemostasis achieved with: aluminum chloride and electrodesiccation   Outcome: patient tolerated procedure well   Post-procedure details: wound care instructions given    Destruction of lesion Complexity: simple   Destruction method: electrodesiccation and curettage   Informed consent: discussed and consent obtained   Timeout:  patient name, date of birth, surgical site, and procedure verified Anesthesia: the lesion was anesthetized in a standard fashion   Anesthetic:  1% lidocaine w/ epinephrine 1-100,000 local infiltration Curettage performed in three different directions: Yes   Curettage cycles:  3 Lesion length (cm):  1.2 Lesion width (cm):   1.2 Margin per side (cm):  0 Final wound size (cm):  1.2 Hemostasis achieved with:  aluminum chloride Outcome: patient tolerated procedure well with no complications   Post-procedure details: wound care instructions given    Specimen 1 - Surgical pathology Differential Diagnosis: R/O BCC vs SCC - treated after biopsy  Check Margins: No  After shave biopsy the base of the lesion was treated with curettage plus cautery  AK (actinic keratosis) (2) Mid Parietal Scalp; Left Temple  Destruction of lesion - Left Temple, Mid Parietal Scalp Complexity: simple   Destruction method: cryotherapy   Informed consent: discussed and consent obtained   Timeout:  patient name, date of birth, surgical site, and procedure verified Lesion destroyed using liquid nitrogen: Yes   Cryotherapy cycles:  5 Outcome: patient tolerated procedure well with no complications    Encounter for screening for malignant neoplasm of skin  Annual skin examination.      I, Lavonna Monarch, MD, have reviewed all documentation for this visit.  The documentation on 09/23/21 for the exam, diagnosis, procedures, and orders are all accurate and complete.

## 2021-10-28 DIAGNOSIS — H6981 Other specified disorders of Eustachian tube, right ear: Secondary | ICD-10-CM | POA: Diagnosis not present

## 2021-10-28 DIAGNOSIS — H9211 Otorrhea, right ear: Secondary | ICD-10-CM | POA: Diagnosis not present

## 2021-12-12 DIAGNOSIS — H6991 Unspecified Eustachian tube disorder, right ear: Secondary | ICD-10-CM | POA: Diagnosis not present

## 2021-12-16 DIAGNOSIS — H9211 Otorrhea, right ear: Secondary | ICD-10-CM | POA: Diagnosis not present

## 2022-01-05 DIAGNOSIS — N393 Stress incontinence (female) (male): Secondary | ICD-10-CM | POA: Diagnosis not present

## 2022-01-05 DIAGNOSIS — N5201 Erectile dysfunction due to arterial insufficiency: Secondary | ICD-10-CM | POA: Diagnosis not present

## 2022-01-05 DIAGNOSIS — Z8546 Personal history of malignant neoplasm of prostate: Secondary | ICD-10-CM | POA: Diagnosis not present

## 2022-01-13 DIAGNOSIS — H6991 Unspecified Eustachian tube disorder, right ear: Secondary | ICD-10-CM | POA: Diagnosis not present

## 2022-01-21 DIAGNOSIS — H90A22 Sensorineural hearing loss, unilateral, left ear, with restricted hearing on the contralateral side: Secondary | ICD-10-CM | POA: Diagnosis not present

## 2022-01-27 ENCOUNTER — Other Ambulatory Visit (HOSPITAL_BASED_OUTPATIENT_CLINIC_OR_DEPARTMENT_OTHER): Payer: Self-pay

## 2022-01-27 MED ORDER — COVID-19 MRNA VAC-TRIS(PFIZER) 30 MCG/0.3ML IM SUSY
PREFILLED_SYRINGE | INTRAMUSCULAR | 0 refills | Status: DC
  Filled 2022-01-27: qty 0.3, 1d supply, fill #0

## 2022-02-04 ENCOUNTER — Ambulatory Visit (HOSPITAL_COMMUNITY)
Admission: RE | Admit: 2022-02-04 | Discharge: 2022-02-04 | Disposition: A | Discharge status: Home / Self Care | Payer: Medicare Other | Source: Ambulatory Visit | Attending: Hematology and Oncology | Admitting: Hematology and Oncology

## 2022-02-04 DIAGNOSIS — K5730 Diverticulosis of large intestine without perforation or abscess without bleeding: Secondary | ICD-10-CM | POA: Diagnosis not present

## 2022-02-04 DIAGNOSIS — M533 Sacrococcygeal disorders, not elsewhere classified: Secondary | ICD-10-CM | POA: Diagnosis not present

## 2022-02-04 DIAGNOSIS — M899 Disorder of bone, unspecified: Secondary | ICD-10-CM | POA: Insufficient documentation

## 2022-02-04 DIAGNOSIS — M160 Bilateral primary osteoarthritis of hip: Secondary | ICD-10-CM | POA: Diagnosis not present

## 2022-02-04 DIAGNOSIS — K4090 Unilateral inguinal hernia, without obstruction or gangrene, not specified as recurrent: Secondary | ICD-10-CM | POA: Diagnosis not present

## 2022-02-04 MED ORDER — GADOBUTROL 1 MMOL/ML IV SOLN
8.0000 mL | Freq: Once | INTRAVENOUS | Status: AC | PRN
  Administered 2022-02-04: 8 mL via INTRAVENOUS

## 2022-02-05 ENCOUNTER — Inpatient Hospital Stay: Payer: Medicare Other | Attending: Hematology and Oncology | Admitting: Hematology and Oncology

## 2022-02-05 ENCOUNTER — Inpatient Hospital Stay: Payer: Medicare Other

## 2022-02-05 ENCOUNTER — Other Ambulatory Visit: Payer: Self-pay | Admitting: Hematology and Oncology

## 2022-02-05 ENCOUNTER — Other Ambulatory Visit: Payer: Self-pay

## 2022-02-05 VITALS — BP 138/76 | HR 62 | Temp 97.7°F | Resp 22 | Wt 186.7 lb

## 2022-02-05 DIAGNOSIS — Z8546 Personal history of malignant neoplasm of prostate: Secondary | ICD-10-CM | POA: Insufficient documentation

## 2022-02-05 DIAGNOSIS — M899 Disorder of bone, unspecified: Secondary | ICD-10-CM | POA: Insufficient documentation

## 2022-02-05 LAB — CBC WITH DIFFERENTIAL (CANCER CENTER ONLY)
Abs Immature Granulocytes: 0.02 10*3/uL (ref 0.00–0.07)
Basophils Absolute: 0.1 10*3/uL (ref 0.0–0.1)
Basophils Relative: 1 %
Eosinophils Absolute: 0.2 10*3/uL (ref 0.0–0.5)
Eosinophils Relative: 4 %
HCT: 47 % (ref 39.0–52.0)
Hemoglobin: 16.6 g/dL (ref 13.0–17.0)
Immature Granulocytes: 0 %
Lymphocytes Relative: 18 %
Lymphs Abs: 1.1 10*3/uL (ref 0.7–4.0)
MCH: 32.7 pg (ref 26.0–34.0)
MCHC: 35.3 g/dL (ref 30.0–36.0)
MCV: 92.5 fL (ref 80.0–100.0)
Monocytes Absolute: 0.7 10*3/uL (ref 0.1–1.0)
Monocytes Relative: 11 %
Neutro Abs: 4.1 10*3/uL (ref 1.7–7.7)
Neutrophils Relative %: 66 %
Platelet Count: 241 10*3/uL (ref 150–400)
RBC: 5.08 MIL/uL (ref 4.22–5.81)
RDW: 13 % (ref 11.5–15.5)
WBC Count: 6.2 10*3/uL (ref 4.0–10.5)
nRBC: 0 % (ref 0.0–0.2)

## 2022-02-05 LAB — CMP (CANCER CENTER ONLY)
ALT: 26 U/L (ref 0–44)
AST: 25 U/L (ref 15–41)
Albumin: 4.3 g/dL (ref 3.5–5.0)
Alkaline Phosphatase: 82 U/L (ref 38–126)
Anion gap: 3 — ABNORMAL LOW (ref 5–15)
BUN: 17 mg/dL (ref 8–23)
CO2: 33 mmol/L — ABNORMAL HIGH (ref 22–32)
Calcium: 10.7 mg/dL — ABNORMAL HIGH (ref 8.9–10.3)
Chloride: 106 mmol/L (ref 98–111)
Creatinine: 1.27 mg/dL — ABNORMAL HIGH (ref 0.61–1.24)
GFR, Estimated: 55 mL/min — ABNORMAL LOW (ref >60)
Glucose, Bld: 93 mg/dL (ref 70–99)
Potassium: 4.7 mmol/L (ref 3.5–5.1)
Sodium: 142 mmol/L (ref 135–145)
Total Bilirubin: 0.5 mg/dL (ref 0.3–1.2)
Total Protein: 6.5 g/dL (ref 6.5–8.1)

## 2022-02-05 NOTE — Progress Notes (Signed)
[place] [phone]   [phone]  PROGRESS NOTE  Patient Care Team: Donnajean Lopes, MD as PCP - General (Internal Medicine) Donnajean Lopes, MD as Consulting Physician (Internal Medicine) Nobie Putnam, MD (Hematology and Oncology) Irine Seal, MD as Attending Physician (Urology) Sable Feil, MD as Attending Physician (Gastroenterology) Lavonna Monarch, MD (Inactive) as Consulting Physician (Dermatology) Eppie Gibson, MD as Attending Physician (Radiation Oncology) Leota Sauers, RN (Inactive) as Oncology Nurse Navigator Malmfelt, Stephani Police, RN as Oncology Nurse Navigator (Oncology)  Hematological/Oncological History # Bone Lesions on CT/MRI 08/30/2020: CT Pelvis W contrast showed 1.2 cm lucent lesion over the superior left iliac bone adjacent the sacroiliac joint corresponding to signal abnormality seen on MRI 09/19/2020: MRI pelvis WWO showed Marrow replacing lesions within the left iliac bone and sacrum. Additionally there was noted to be a new enhancing lesion at the tip of the right greater trochanter measuring up to 1.7 cm also suspicious for metastatic disease. 09/27/2020: establish care with Dr. Lorenso Courier 10/29/2020: CT biopsy performed of left iliac bone lesion. Pathology showed bone and marrow without evidence of carcinoma.   Interval History:  Martin Curry 88 y.o. male with medical history significant for bone lesions noted on CT/MRI who presents for a follow up visit. The patient's last visit was on 08/06/2021. In the interim since the last visit he completed an MRI pelvis on 02/04/2022 which shows a stable lesion.   On exam today Martin Curry is unaccompanied.  He reports he has been well overall in the interim since his last visit.  He reports he occasionally has a "feeling" in his hip where the lesion supposedly is.  He reports the MRI occurred at 8 PM last night and overall everything went pretty smoothly.  He reports that he has  had stable weight and no new symptoms.  He is overall at his baseline level of health and has no questions concerns or complaints today.  He denies any fevers, chills, sweats, nausea, vomiting or diarrhea.  His weight is been stable.  A full 10 point ROS is listed below.  The bulk of our discussion focused on the results of his repeat MRI.  MEDICAL HISTORY:  Past Medical History:  Diagnosis Date   Atypical nevus 09/07/2002   left abdomen-slight   BCC (basal cell carcinoma of skin) 07/19/2014   left sideburn- +margin-exc.   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   Nodular basal cell carcinoma (BCC) 07/16/2015   left scalp-CX35FU   Polycythemia    Prostate cancer ([place]) 1990's   total prostatectomy and adjuvant radiation. Last PSA was 0.02 in 09/2011   SCC (squamous cell carcinoma) 04/03/2009   forehead-txpbx   SCC (squamous cell carcinoma) 07/16/2015   left front scalp   SCC (squamous cell carcinoma) 07/16/2015   left front scalp-Cx35FU  SCCA (squamous cell carcinoma) of skin 12/20/2019   Right forehead (in situ)   SCCA (squamous cell carcinoma) of skin 05/27/2020   Mid Parietal Scalp (in situ)   Superficial basal cell carcinoma (BCC) 11/21/2012   behind left ear-CX35FU, midback-CX35FU, right forehead-CX35FU-exc   Superficial basal cell carcinoma (BCC) 11/02/2017   left post neck-CX35FU   Superficial  nodular basal cell carcinoma (BCC) 12/20/2019   Left temporal scalp   Superficial nodular basal cell carcinoma (BCC) 05/27/2020   Right Preauricular area (curet and 5FU)    SURGICAL HISTORY: Past Surgical History:  Procedure Laterality Date   ADJACENT TISSUE TRANSFER/TISSUE REARRANGEMENT N/A 05/22/2019   Procedure: COMPLEX CLOSURE OF NECK WOUND;  Surgeon: Cindra Presume, MD;  Location: [place];  Service: Plastics;  Laterality: N/A;   ALLOGRAFT APPLICATION N/A 7/56/4332   Procedure: POSSIBLE FACIAL NERVE RECONSTRUCTION WITH NERVE ALLOGRAFT VS AUTOGRAFT;  Surgeon: Cindra Presume, MD;  Location: [place];  Service: Plastics;  Laterality: N/A;   BLADDER SURGERY     due to prostatectomy.    CATARACT EXTRACTION     right   COLONOSCOPY     neg in the past; due in 2014 with [place] Dr. Sharlett Iles   EYE SURGERY     50 years ago.    HERNIA REPAIR     MOHS SURGERY     NECK DISSECTION  05/22/2019    NECK DISSECTION   PAROTIDECTOMY  05/22/2019   PAROTIDECTOMY with facial nerve dissection    PAROTIDECTOMY Right 05/22/2019   Procedure: PAROTIDECTOMY;  Surgeon: Izora Gala, MD;  Location: [place];  Service: ENT;  Laterality: Right;   PROSTATE SURGERY     RADICAL NECK DISSECTION Right 05/22/2019   Procedure: RADICAL NECK DISSECTION;  Surgeon: Izora Gala, MD;  Location: [place];  Service: ENT;  Laterality: Right;   TONSILLECTOMY      SOCIAL HISTORY: Social History   Socioeconomic History   Marital status: Married    Spouse name: Not on file   Number of children: 3   Years of education: Not on file   Highest education level: Not on file  Occupational History    Comment: retired IT consultant; 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  Tobacco Use   Smoking status: Never   Smokeless tobacco: Never  Vaping Use   Vaping Use: Never used  Substance and Sexual Activity   Alcohol use: No   Drug use: No   Sexual activity: Not on file  Other Topics Concern   Not on file  Social History Narrative   Not on file    Social Determinants of Health   Financial Resource Strain: Not on file  Food Insecurity: Not on file  Transportation Needs: Not on file  Physical Activity: Not on file  Stress: Not on file  Social Connections: Not on file  Intimate Partner Violence: Not on file    FAMILY HISTORY: Family History  Problem Relation Age of Onset   Uterine cancer Mother 49   Lung cancer Father 51   Prostate cancer Brother    Prostate cancer Brother     ALLERGIES:  is allergic to methylprednisolone, penicillins, latex, and tape.  MEDICATIONS:  Current Outpatient Medications  Medication Sig Dispense Refill   amLODipine (NORVASC) 5 MG tablet Take 5 mg by mouth daily.      aspirin 81 MG tablet Take 81 mg by mouth daily.     benazepril (LOTENSIN) 10 MG tablet Take 10 mg by mouth daily.     famotidine-calcium carbonate-magnesium hydroxide (  PEPCID COMPLETE) 10-800-165 MG chewable tablet Take one tablet PRN     levothyroxine (SYNTHROID) 25 MCG tablet Take 25 mcg by mouth daily before breakfast.     Multiple Vitamin (MULTIVITAMIN) tablet Take 1 tablet by mouth daily.     potassium citrate (UROCIT-K) 10 MEQ (1080 MG) SR tablet Take 10 mEq by mouth 3 (three) times daily with meals.     simvastatin (ZOCOR) 20 MG tablet Take 20 mg by mouth every evening.     No current facility-administered medications for this visit.    REVIEW OF SYSTEMS:   Constitutional: ( - ) fevers, ( - )  chills , ( - ) night sweats Eyes: ( - ) blurriness of vision, ( - ) double vision, ( - ) watery eyes Ears, nose, mouth, throat, and face: ( - ) mucositis, ( - ) sore throat Respiratory: ( - ) cough, ( - ) dyspnea, ( - ) wheezes Cardiovascular: ( - ) palpitation, ( - ) chest discomfort, ( - ) lower extremity swelling Gastrointestinal:  ( - ) nausea, ( - ) heartburn, ( - ) change in bowel habits Skin: ( - ) abnormal skin rashes Lymphatics: ( - ) new lymphadenopathy, ( - ) easy bruising Neurological: ( - ) numbness, ( - )  tingling, ( - ) new weaknesses Behavioral/Psych: ( - ) mood change, ( - ) new changes  All other systems were reviewed with the patient and are negative.  PHYSICAL EXAMINATION: ECOG PERFORMANCE STATUS: 1 - Symptomatic but completely ambulatory  Vitals:   02/05/22 1044  BP: 138/76  Pulse: 62  Resp: (!) 22  Temp: 97.7 F (36.5 C)  SpO2: 99%   Filed Weights   02/05/22 1044  Weight: 186 lb 11.2 oz (84.7 kg)    GENERAL: Well-appearing elderly Caucasian male, alert, no distress and comfortable SKIN: skin color, texture, turgor are normal, no rashes or significant lesions EYES: conjunctiva are pink and non-injected, sclera clear LUNGS: clear to auscultation and percussion with normal breathing effort HEART: regular rate & rhythm and no murmurs and no lower extremity edema Musculoskeletal: no cyanosis of digits and no clubbing  PSYCH: alert & oriented x 3, fluent speech NEURO: no focal motor/sensory deficits  LABORATORY DATA:  I have reviewed the data as listed    Latest Ref Rng & Units 02/05/2022    9:21 AM 08/06/2021    9:25 AM 02/06/2021    9:37 AM  CBC  WBC 4.0 - 10.5 K/uL 6.2  7.1  5.3   Hemoglobin 13.0 - 17.0 g/dL 16.6  15.9  15.1   Hematocrit 39.0 - 52.0 % 47.0  46.3  44.4   Platelets 150 - 400 K/uL 241  234  220        Latest Ref Rng & Units 02/05/2022    9:21 AM 08/06/2021    9:25 AM 02/06/2021    9:37 AM  CMP  Glucose 70 - 99 mg/dL 93  124  98   BUN 8 - 23 mg/dL _0 Creatinine 0.61 - 1.24 mg/dL 1.27  1.41  1.30   Sodium 135 - 145 mmol/L 142  143  143   Potassium 3.5 - 5.1 mmol/L 4.7  4.5  4.1   Chloride 98 - 111 mmol/L 106  108  109   CO2 22 - 32 mmol/L 33  30  26   Calcium 8.9 - 10.3 mg/dL 10.7  10.1  9.3   Total Protein 6.5 -  8.1 g/dL 6.5  7.0  6.6   Total Bilirubin 0.3 - 1.2 mg/dL 0.5  0.6  0.5   Alkaline Phos 38 - 126 U/L 82  76  70   AST 15 - 41 U/L _0 ALT 0 - 44 U/L _1 Lab Results  Component Value Date   MPROTEIN  Not Observed 09/27/2020   Lab Results  Component Value Date   KPAFRELGTCHN 25.6 (H) 09/27/2020   LAMBDASER 23.0 09/27/2020   KAPLAMBRATIO 1.11 09/27/2020    RADIOGRAPHIC STUDIES: MR Pelvis W Wo Contrast  Result Date: 02/05/2022 CLINICAL DATA:  Bone lesion, pelvis, incidental EXAM: MRI PELVIS WITHOUT AND WITH CONTRAST TECHNIQUE: Multiplanar multisequence MR imaging of the pelvis was performed both before and after administration of intravenous contrast. CONTRAST:  9m GADAVIST GADOBUTROL 1 MMOL/ML IV SOLN COMPARISON:  MRI 08/03/2021, MRI 02/02/2021, CT biopsy 10/29/2020, CT 10/09/2020, left hip MRI 04/09/2020. FINDINGS: Bones/Joint/Cartilage There is a T2 hyperintense/T1 hypointense enhancing lesion in the sacrum, which spans from the inferior aspect of S2, throughout the central and left aspect of S3, and into the upper aspect of S4. This lesion measures 4.8 cm craniocaudal (sagittal T2 image 34), previously 3.8 cm craniocaudal. In the axial dimension, this measures up to 7.0 cm, previously up to 5.9 cm (axial postcontrast image 21). Additional lesion within the posterior left iliac bone, which was previously biopsied, is unchanged in size measuring 1.3 cm, previously 1.3 cm (coronal stir image 10). There is no lesion in the right greater trochanter as noted on prior exam. There is a new T2 hyperintense/T1 hypointense lesion in the superior right iliac bone measuring 1.8 cm (series 15, image 15). There is mild bilateral hip osteoarthritis with left anterolateral osteochondral lesion/subchondral cystic change. Degenerative changes of the lower lumbar spine, SI joints, and pubic symphysis. Ligaments Suboptimally assessed by CT. Muscles and Tendons No significant muscle edema or muscle atrophy. The gluteal tendons are intact with mild tendinosis of the distal gluteus minimus tendons. There is reactive intramuscular edema in the hip adductors. Proximal hamstrings are intact. Soft tissues There is a left  inguinal hernia containing a short segment of sigmoid colon at the internal inguinal ring. Extensive sigmoid diverticulosis. Prior prostatectomy. IMPRESSION: Enlarging sacral bone lesion and new lesion in the anterior superior right iliac bone, suspicious for metastases. Stable left posterior iliac bone lesion, which was previously biopsied. Electronically Signed   By: JMaurine SimmeringM.D.   On: 02/05/2022 16:45    ASSESSMENT & PLAN Martin HAYMER89y.o. male with medical history significant for bone lesions noted on CT/MRI who presents for a follow up visit.  The patient underwent a biopsy which showed no evidence of malignancy.  Only showed normal bone tissue and normal bone marrow.  Discussed these findings with IR who recommended repeat imaging in 3 months time as they are certain there biopsy site was in the right location and did not think another biopsy would be necessary.  I discussed these findings with the patient he was in agreement that we could repeat imaging in 6 months time in order to reassess.  # Bone Lesions -- based on CT pelvis and MRI pelvis findings are concerning for a stable lesion of unclear etiology.  --biopsy shows no evidence of malignancy. Discuss with radiology who notes a repeat biopsy was not recommended and repeat imaging would be preferred.  --negative multiple myeloma labs with SPEP and serum free  light chains showing no evidence of monoclonal gammopathy.  --Given his prior history of prostate cancer we ordered a PSA, which was normal. Repeated PSA previously, found to be within normal limits --CT C/A/P shows no evidence of disease elsewhere in the body.  --Labs today show white blood cell count 6.2, hemoglobin 16.6, MCV 92.5, and platelets of 241 --Preliminary discussion with radiology noted stability of disease with benign appearing features, however report appears to show new lesion with growth --Return to clinic with imaging studies in 6 months.  No orders of the  defined types were placed in this encounter.   All questions were answered. The patient knows to call the clinic with any problems, questions or concerns.  A total of more than 25 minutes were spent on this encounter with face-to-face time and non-face-to-face time, including preparing to see the patient, ordering tests and/or medications, counseling the patient and coordination of care as outlined above.   Ledell Peoples, MD Department of Hematology/Oncology [place] [facility] Phone: [phone] Pager: [phone] Email: [email or web address]  02/08/2022 9:48 PM

## 2022-03-19 DIAGNOSIS — Z125 Encounter for screening for malignant neoplasm of prostate: Secondary | ICD-10-CM | POA: Diagnosis not present

## 2022-03-19 DIAGNOSIS — E039 Hypothyroidism, unspecified: Secondary | ICD-10-CM | POA: Diagnosis not present

## 2022-03-19 DIAGNOSIS — E785 Hyperlipidemia, unspecified: Secondary | ICD-10-CM | POA: Diagnosis not present

## 2022-03-19 DIAGNOSIS — I10 Essential (primary) hypertension: Secondary | ICD-10-CM | POA: Diagnosis not present

## 2022-03-26 DIAGNOSIS — R82998 Other abnormal findings in urine: Secondary | ICD-10-CM | POA: Diagnosis not present

## 2022-03-26 DIAGNOSIS — Z0000 Encounter for general adult medical examination without abnormal findings: Secondary | ICD-10-CM | POA: Diagnosis not present

## 2022-03-26 DIAGNOSIS — I129 Hypertensive chronic kidney disease with stage 1 through stage 4 chronic kidney disease, or unspecified chronic kidney disease: Secondary | ICD-10-CM | POA: Diagnosis not present

## 2022-03-26 DIAGNOSIS — I10 Essential (primary) hypertension: Secondary | ICD-10-CM | POA: Diagnosis not present

## 2022-03-26 DIAGNOSIS — N1831 Chronic kidney disease, stage 3a: Secondary | ICD-10-CM | POA: Diagnosis not present

## 2022-03-26 DIAGNOSIS — E785 Hyperlipidemia, unspecified: Secondary | ICD-10-CM | POA: Diagnosis not present

## 2022-03-26 DIAGNOSIS — E039 Hypothyroidism, unspecified: Secondary | ICD-10-CM | POA: Diagnosis not present

## 2022-04-03 DIAGNOSIS — H6123 Impacted cerumen, bilateral: Secondary | ICD-10-CM | POA: Diagnosis not present

## 2022-04-06 DIAGNOSIS — D1801 Hemangioma of skin and subcutaneous tissue: Secondary | ICD-10-CM | POA: Diagnosis not present

## 2022-04-06 DIAGNOSIS — C4441 Basal cell carcinoma of skin of scalp and neck: Secondary | ICD-10-CM | POA: Diagnosis not present

## 2022-04-06 DIAGNOSIS — L821 Other seborrheic keratosis: Secondary | ICD-10-CM | POA: Diagnosis not present

## 2022-04-06 DIAGNOSIS — C44319 Basal cell carcinoma of skin of other parts of face: Secondary | ICD-10-CM | POA: Diagnosis not present

## 2022-04-06 DIAGNOSIS — L570 Actinic keratosis: Secondary | ICD-10-CM | POA: Diagnosis not present

## 2022-05-19 DIAGNOSIS — H524 Presbyopia: Secondary | ICD-10-CM | POA: Diagnosis not present

## 2022-06-01 DIAGNOSIS — C4441 Basal cell carcinoma of skin of scalp and neck: Secondary | ICD-10-CM | POA: Diagnosis not present

## 2022-06-08 DIAGNOSIS — C44319 Basal cell carcinoma of skin of other parts of face: Secondary | ICD-10-CM | POA: Diagnosis not present

## 2022-06-15 DIAGNOSIS — C44319 Basal cell carcinoma of skin of other parts of face: Secondary | ICD-10-CM | POA: Diagnosis not present

## 2022-06-30 DIAGNOSIS — H524 Presbyopia: Secondary | ICD-10-CM | POA: Diagnosis not present

## 2022-06-30 DIAGNOSIS — C07 Malignant neoplasm of parotid gland: Secondary | ICD-10-CM | POA: Diagnosis not present

## 2022-06-30 DIAGNOSIS — H9113 Presbycusis, bilateral: Secondary | ICD-10-CM | POA: Diagnosis not present

## 2022-08-05 ENCOUNTER — Telehealth: Payer: Self-pay | Admitting: *Deleted

## 2022-08-05 NOTE — Telephone Encounter (Signed)
Received phone call from pt inquiring about his upcoming appt. He wasn't sure if he needed to have this appt and if he does need it, does he need a scan prior to this appt with Dr. Leonides Schanz. Dr. Derek Mound last note indicated 6 month f/u appt with scan prior to clinic visit.  Please advise

## 2022-08-06 ENCOUNTER — Other Ambulatory Visit: Payer: Self-pay | Admitting: Hematology and Oncology

## 2022-08-06 ENCOUNTER — Telehealth: Payer: Self-pay | Admitting: *Deleted

## 2022-08-06 DIAGNOSIS — M899 Disorder of bone, unspecified: Secondary | ICD-10-CM

## 2022-08-06 NOTE — Telephone Encounter (Signed)
Received call from pt. He states his MRI is now scheduled for 08/13/22 and he needs to change his appt with Dr. Leonides Schanz to after he has his scan. Advised that we can see him on 08/25/22 @ 11:30 am for labs and then Dr. Leonides Schanz at 12noon.Pt voiced understanding.  Scheduling message sent

## 2022-08-10 ENCOUNTER — Inpatient Hospital Stay: Payer: Medicare Other | Admitting: Hematology and Oncology

## 2022-08-10 ENCOUNTER — Inpatient Hospital Stay: Payer: Medicare Other

## 2022-08-13 ENCOUNTER — Ambulatory Visit (HOSPITAL_COMMUNITY)
Admission: RE | Admit: 2022-08-13 | Discharge: 2022-08-13 | Disposition: A | Discharge status: Home / Self Care | Payer: Medicare Other | Source: Ambulatory Visit | Attending: Hematology and Oncology | Admitting: Hematology and Oncology

## 2022-08-13 DIAGNOSIS — K639 Disease of intestine, unspecified: Secondary | ICD-10-CM | POA: Diagnosis not present

## 2022-08-13 DIAGNOSIS — M899 Disorder of bone, unspecified: Secondary | ICD-10-CM | POA: Insufficient documentation

## 2022-08-13 DIAGNOSIS — M160 Bilateral primary osteoarthritis of hip: Secondary | ICD-10-CM | POA: Diagnosis not present

## 2022-08-13 MED ORDER — GADOBUTROL 1 MMOL/ML IV SOLN
8.5000 mL | Freq: Once | INTRAVENOUS | Status: AC | PRN
  Administered 2022-08-13: 8.5 mL via INTRAVENOUS

## 2022-08-25 ENCOUNTER — Other Ambulatory Visit: Payer: Self-pay | Admitting: Hematology and Oncology

## 2022-08-25 ENCOUNTER — Inpatient Hospital Stay: Payer: Medicare Other | Admitting: Hematology and Oncology

## 2022-08-25 ENCOUNTER — Other Ambulatory Visit: Payer: Self-pay

## 2022-08-25 ENCOUNTER — Inpatient Hospital Stay: Payer: Medicare Other | Attending: Hematology and Oncology

## 2022-08-25 VITALS — BP 132/71 | HR 62 | Temp 97.5°F | Resp 13 | Wt 181.5 lb

## 2022-08-25 DIAGNOSIS — E039 Hypothyroidism, unspecified: Secondary | ICD-10-CM | POA: Diagnosis not present

## 2022-08-25 DIAGNOSIS — M899 Disorder of bone, unspecified: Secondary | ICD-10-CM

## 2022-08-25 LAB — CMP (CANCER CENTER ONLY)
ALT: 24 U/L (ref 0–44)
AST: 23 U/L (ref 15–41)
Albumin: 4.1 g/dL (ref 3.5–5.0)
Alkaline Phosphatase: 76 U/L (ref 38–126)
Anion gap: 4 — ABNORMAL LOW (ref 5–15)
BUN: 15 mg/dL (ref 8–23)
CO2: 30 mmol/L (ref 22–32)
Calcium: 10.6 mg/dL — ABNORMAL HIGH (ref 8.9–10.3)
Chloride: 107 mmol/L (ref 98–111)
Creatinine: 1.27 mg/dL — ABNORMAL HIGH (ref 0.61–1.24)
GFR, Estimated: 55 mL/min — ABNORMAL LOW (ref >60)
Glucose, Bld: 91 mg/dL (ref 70–99)
Potassium: 4.9 mmol/L (ref 3.5–5.1)
Sodium: 141 mmol/L (ref 135–145)
Total Bilirubin: 0.7 mg/dL (ref 0.3–1.2)
Total Protein: 6.4 g/dL — ABNORMAL LOW (ref 6.5–8.1)

## 2022-08-25 LAB — CBC WITH DIFFERENTIAL (CANCER CENTER ONLY)
Abs Immature Granulocytes: 0.02 10*3/uL (ref 0.00–0.07)
Basophils Absolute: 0 10*3/uL (ref 0.0–0.1)
Basophils Relative: 1 %
Eosinophils Absolute: 0.2 10*3/uL (ref 0.0–0.5)
Eosinophils Relative: 3 %
HCT: 45.7 % (ref 39.0–52.0)
Hemoglobin: 15.6 g/dL (ref 13.0–17.0)
Immature Granulocytes: 0 %
Lymphocytes Relative: 16 %
Lymphs Abs: 1.1 10*3/uL (ref 0.7–4.0)
MCH: 32 pg (ref 26.0–34.0)
MCHC: 34.1 g/dL (ref 30.0–36.0)
MCV: 93.6 fL (ref 80.0–100.0)
Monocytes Absolute: 0.6 10*3/uL (ref 0.1–1.0)
Monocytes Relative: 9 %
Neutro Abs: 4.7 10*3/uL (ref 1.7–7.7)
Neutrophils Relative %: 71 %
Platelet Count: 250 10*3/uL (ref 150–400)
RBC: 4.88 MIL/uL (ref 4.22–5.81)
RDW: 13.2 % (ref 11.5–15.5)
WBC Count: 6.6 10*3/uL (ref 4.0–10.5)
nRBC: 0 % (ref 0.0–0.2)

## 2022-08-25 LAB — LACTATE DEHYDROGENASE: LDH: 158 U/L (ref 98–192)

## 2022-08-25 NOTE — Progress Notes (Signed)
[facility] [phone]   Fax:([phone]) [phone]  PROGRESS NOTE  Patient Care Team: Garlan Fillers, MD as PCP - General (Internal Medicine) Garlan Fillers, MD as Consulting Physician (Internal Medicine) Exie Parody, MD (Hematology and Oncology) Bjorn Pippin, MD as Attending Physician (Urology) Mardella Layman, MD as Attending Physician (Gastroenterology) Janalyn Harder, MD (Inactive) as Consulting Physician (Dermatology) Lonie Peak, MD as Attending Physician (Radiation Oncology) Barrie Folk, RN (Inactive) as Oncology Nurse Navigator Malmfelt, Lise Auer, RN as Oncology Nurse Navigator (Oncology)  Hematological/Oncological History # Bone Lesions on CT/MRI 08/30/2020: CT Pelvis W contrast showed 1.2 cm lucent lesion over the superior left iliac bone adjacent the sacroiliac joint corresponding to signal abnormality seen on MRI 09/19/2020: MRI pelvis WWO showed Marrow replacing lesions within the left iliac bone and sacrum. Additionally there was noted to be a new enhancing lesion at the tip of the right greater trochanter measuring up to 1.7 cm also suspicious for metastatic disease. 09/27/2020: establish care with Dr. Leonides Schanz 10/29/2020: CT biopsy performed of left iliac bone lesion. Pathology showed bone and marrow without evidence of carcinoma.   Interval History:  Martin Curry 89 y.o. male with medical history significant for bone lesions noted on CT/MRI who presents for a follow up visit. The patient's last visit was on 08/06/2021. In the interim since the last visit he completed an MRI pelvis on 08/13/2022 which showed increased size of the sacral mass, measuring 6 cm up from 4.8 cm 6 months ago.  On exam today Martin Curry is unaccompanied.  He reports he has been well overall in the interim since her last visit.  He notes that he has not had any pain in his back, hips, or elsewhere in his body.  He does have a little bit of occasional numbness on the  back of his head and that does concern him.  He reports that he has had stable weight and his appetite is good.  His energy levels are strong and is able to do all of his day-to-day activities.  Overall he is at his baseline level of health and has no questions concerns or complaints today.  He denies any fevers, chills, sweats, nausea, vomiting or diarrhea.  His weight is been stable.  A full 10 point ROS is listed below.   MEDICAL HISTORY:  Past Medical History:  Diagnosis Date   Atypical nevus 09/07/2002   left abdomen-slight   BCC (basal cell carcinoma of skin) 07/19/2014   left sideburn- +margin-exc.   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   Nodular basal cell carcinoma (BCC) 07/16/2015   left scalp-CX35FU   Polycythemia    Prostate cancer (HCC) 1990's   total prostatectomy and adjuvant radiation. Last PSA was 0.02 in 09/2011   SCC (squamous cell carcinoma) 04/03/2009   forehead-txpbx  SCC (squamous cell carcinoma) 07/16/2015   left front scalp   SCC (squamous cell carcinoma) 07/16/2015   left front scalp-Cx35FU   SCCA (squamous cell carcinoma) of skin 12/20/2019   Right forehead (in situ)   SCCA (squamous cell carcinoma) of skin 05/27/2020   Mid Parietal Scalp (in situ)   Superficial basal cell carcinoma (BCC) 11/21/2012   behind left ear-CX35FU, midback-CX35FU, right forehead-CX35FU-exc    Superficial basal cell carcinoma (BCC) 11/02/2017   left post neck-CX35FU   Superficial nodular basal cell carcinoma (BCC) 12/20/2019   Left temporal scalp   Superficial nodular basal cell carcinoma (BCC) 05/27/2020   Right Preauricular area (curet and 5FU)    SURGICAL HISTORY: Past Surgical History:  Procedure Laterality Date   ADJACENT TISSUE TRANSFER/TISSUE REARRANGEMENT N/A 05/22/2019   Procedure: COMPLEX CLOSURE OF NECK WOUND;  Surgeon: Allena Napoleon, MD;  Location: [facility] OR;  Service: Plastics;  Laterality: N/A;   ALLOGRAFT APPLICATION N/A 05/22/2019   Procedure: POSSIBLE FACIAL NERVE RECONSTRUCTION WITH NERVE ALLOGRAFT VS AUTOGRAFT;  Surgeon: Allena Napoleon, MD;  Location: [facility] OR;  Service: Plastics;  Laterality: N/A;   BLADDER SURGERY     due to prostatectomy.    CATARACT EXTRACTION     right   COLONOSCOPY     neg in the past; due in 2014 with [place] Dr. Jarold Motto   EYE SURGERY     50 years ago.    HERNIA REPAIR     MOHS SURGERY     NECK DISSECTION  05/22/2019    NECK DISSECTION   PAROTIDECTOMY  05/22/2019   PAROTIDECTOMY with facial nerve dissection    PAROTIDECTOMY Right 05/22/2019   Procedure: PAROTIDECTOMY;  Surgeon: Serena Colonel, MD;  Location: [facility] OR;  Service: ENT;  Laterality: Right;   PROSTATE SURGERY     RADICAL NECK DISSECTION Right 05/22/2019   Procedure: RADICAL NECK DISSECTION;  Surgeon: Serena Colonel, MD;  Location: [facility] OR;  Service: ENT;  Laterality: Right;   TONSILLECTOMY      SOCIAL HISTORY: Social History   Socioeconomic History   Marital status: Married    Spouse name: Not on file   Number of children: 3   Years of education: Not on file   Highest education level: Not on file  Occupational History    Comment: retired Scientist, [employer]; 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  Tobacco Use   Smoking status: Never   Smokeless tobacco: Never  Vaping Use   Vaping Use: Never used  Substance and Sexual Activity   Alcohol use: No   Drug use: No   Sexual activity: Not on  file  Other Topics Concern   Not on file  Social History Narrative   Not on file   Social Determinants of Health   Financial Resource Strain: Not on file  Food Insecurity: Not on file  Transportation Needs: Not on file  Physical Activity: Not on file  Stress: Not on file  Social Connections: Not on file  Intimate Partner Violence: Not on file    FAMILY HISTORY: Family History  Problem Relation Age of Onset   Uterine cancer Mother 9   Lung cancer Father 67   Prostate cancer Brother    Prostate cancer Brother     ALLERGIES:  is allergic to methylprednisolone, penicillins, latex, and tape.  MEDICATIONS:  Current Outpatient Medications  Medication Sig Dispense Refill   amLODipine (NORVASC) 5 MG tablet Take 5 mg by mouth daily.      aspirin 81 MG tablet Take 81 mg by  mouth daily.     benazepril (LOTENSIN) 10 MG tablet Take 10 mg by mouth daily.     famotidine-calcium carbonate-magnesium hydroxide (PEPCID COMPLETE) 10-800-165 MG chewable tablet Take one tablet PRN     levothyroxine (SYNTHROID) 25 MCG tablet Take 25 mcg by mouth daily before breakfast.     Multiple Vitamin (MULTIVITAMIN) tablet Take 1 tablet by mouth daily.     potassium citrate (UROCIT-K) 10 MEQ (1080 MG) SR tablet Take 10 mEq by mouth 3 (three) times daily with meals.     simvastatin (ZOCOR) 20 MG tablet Take 20 mg by mouth every evening.     No current facility-administered medications for this visit.    REVIEW OF SYSTEMS:   Constitutional: ( - ) fevers, ( - )  chills , ( - ) night sweats Eyes: ( - ) blurriness of vision, ( - ) double vision, ( - ) watery eyes Ears, nose, mouth, throat, and face: ( - ) mucositis, ( - ) sore throat Respiratory: ( - ) cough, ( - ) dyspnea, ( - ) wheezes Cardiovascular: ( - ) palpitation, ( - ) chest discomfort, ( - ) lower extremity swelling Gastrointestinal:  ( - ) nausea, ( - ) heartburn, ( - ) change in bowel habits Skin: ( - ) abnormal skin rashes Lymphatics: ( - )  new lymphadenopathy, ( - ) easy bruising Neurological: ( - ) numbness, ( - ) tingling, ( - ) new weaknesses Behavioral/Psych: ( - ) mood change, ( - ) new changes  All other systems were reviewed with the patient and are negative.  PHYSICAL EXAMINATION: ECOG PERFORMANCE STATUS: 1 - Symptomatic but completely ambulatory  Vitals:   08/25/22 1142  BP: 132/71  Pulse: 62  Resp: 13  Temp: (!) 97.5 F (36.4 C)  SpO2: 98%   Filed Weights   08/25/22 1142  Weight: 181 lb 8 oz (82.3 kg)    GENERAL: Well-appearing elderly Caucasian male, alert, no distress and comfortable SKIN: skin color, texture, turgor are normal, no rashes or significant lesions EYES: conjunctiva are pink and non-injected, sclera clear LUNGS: clear to auscultation and percussion with normal breathing effort HEART: regular rate & rhythm and no murmurs and no lower extremity edema Musculoskeletal: no cyanosis of digits and no clubbing  PSYCH: alert & oriented x 3, fluent speech NEURO: no focal motor/sensory deficits  LABORATORY DATA:  I have reviewed the data as listed    Latest Ref Rng & Units 08/25/2022   11:27 AM 02/05/2022    9:21 AM 08/06/2021    9:25 AM  CBC  WBC 4.0 - 10.5 K/uL 6.6  6.2  7.1   Hemoglobin 13.0 - 17.0 g/dL 16.1  09.6  04.5   Hematocrit 39.0 - 52.0 % 45.7  47.0  46.3   Platelets 150 - 400 K/uL 250  241  234        Latest Ref Rng & Units 08/25/2022   11:27 AM 02/05/2022    9:21 AM 08/06/2021    9:25 AM  CMP  Glucose 70 - 99 mg/dL 91  93  409   BUN 8 - 23 mg/dL 15  17  19    Creatinine 0.61 - 1.24 mg/dL 8.11  9.14  7.82   Sodium 135 - 145 mmol/L 141  142  143   Potassium 3.5 - 5.1 mmol/L 4.9  4.7  4.5   Chloride 98 - 111 mmol/L 107  106  108   CO2 22 - 32 mmol/L 30  33  30   Calcium 8.9 - 10.3 mg/dL 16.1  09.6  04.5   Total Protein 6.5 - 8.1 g/dL 6.4  6.5  7.0   Total Bilirubin 0.3 - 1.2 mg/dL 0.7  0.5  0.6   Alkaline Phos 38 - 126 U/L 76  82  76   AST 15 - 41 U/L 23  25  19    ALT 0 -  44 U/L 24  26  19      Lab Results  Component Value Date   MPROTEIN Not Observed 09/27/2020   Lab Results  Component Value Date   KPAFRELGTCHN 25.6 (H) 09/27/2020   LAMBDASER 23.0 09/27/2020   KAPLAMBRATIO 1.11 09/27/2020    RADIOGRAPHIC STUDIES: MR Pelvis W Wo Contrast  Result Date: 08/21/2022 CLINICAL DATA:  Bone lesion, patient presents for evaluation. EXAM: MRI PELVIS WITHOUT AND WITH CONTRAST TECHNIQUE: Multiplanar multisequence MR imaging of the pelvis was performed both before and after administration of intravenous contrast. CONTRAST:  8.61mL GADAVIST GADOBUTROL 1 MMOL/ML IV SOLN COMPARISON:  02/04/2022, 08/03/2021 FINDINGS: Bones: No hip fracture, dislocation or avascular necrosis. Enhancing sacral bone lesion extending from S2 through S5 with slightly more inferior extension into the S5 segment measuring 6 cm in craniocaudal dimension compared with 4.8 cm previously. Persistent stable bone lesion in the right superior ilium. 14 mm bone lesion in the left posterior ilium. No SI joint widening or erosive changes. Articular cartilage and labrum Articular cartilage: Mild partial thickness cartilage loss of the femoral head and acetabulum bilaterally. Labrum: Grossly intact, but evaluation is limited by lack of intraarticular fluid. Joint or bursal effusion Joint effusion:  No hip joint effusion.  No SI joint effusion. Bursae:  No bursal fluid. Muscles and tendons Flexors: Normal. Extensors: Normal. Abductors: Normal. Adductors: Normal. Gluteals: Normal. Hamstrings: Normal. Other findings No pelvic free fluid. No fluid collection or hematoma. No inguinal lymphadenopathy. No inguinal hernia. IMPRESSION: 1. Enlarging sacral bone lesion extending from S2 through S5 with slightly more inferior extension into the S5 segment measuring 6 cm in craniocaudal dimension compared with 4.8 cm previously. Persistent stable bone lesion in the right superior ilium. Stable 14 mm bone lesion in the left posterior  ilium. Overall appearance is most consistent with metastatic disease. 2. Mild osteoarthritis of bilateral hips. Electronically Signed   By: Elige Ko [name]   On: 08/21/2022 13:47    ASSESSMENT & PLAN Martin Curry 89 y.o. male with medical history significant for bone lesions noted on CT/MRI who presents for a follow up visit.  The patient underwent a biopsy which showed no evidence of malignancy.  Only showed normal bone tissue and normal bone marrow.  Discussed these findings with IR who recommended repeat imaging in 3 months time as they are certain there biopsy site was in the right location and did not think another biopsy would be necessary.  I discussed these findings with the patient he was in agreement that we could repeat imaging in 6 months time in order to reassess.  # Bone Lesions -- based on CT pelvis and MRI pelvis findings are concerning for a stable lesion of unclear etiology.  --initial biopsy shows no evidence of malignancy. Previously discussed with radiology who notes a repeat biopsy was not recommended and repeat imaging would be preferred.  --negative multiple myeloma labs with SPEP and serum free light chains showing no evidence of monoclonal gammopathy.  --Given his prior history of prostate cancer we ordered a PSA, which was normal. Repeated PSA previously,  found to be within normal limits --CT C/A/P shows no evidence of disease elsewhere in the body in August 2022 --Labs today show white blood cell count 6.6, hemoglobin 15.6, MCV 93.6, and platelets of 250 --will reach out to IR again to see if biopsy could be considered due to continued growth. If biopsy is no recommended we can pursue continued serial imaging.  --Return to clinic with imaging studies in 6 months.  Orders Placed This Encounter  Procedures   CT BIOPSY    Standing Status:   Future    Standing Expiration Date:   08/25/2023    Order Specific Question:   Lab orders requested (DO NOT place separate lab  orders, these will be automatically ordered during procedure specimen collection):    Answer:   Surgical Pathology    Order Specific Question:   Reason for Exam (SYMPTOM  OR DIAGNOSIS REQUIRED)    Answer:   Sarcal mass, increasing in size. Negative biopsy in Sept 2022. Requesting consideration of repeat biopsy.    Order Specific Question:   Preferred location?    Answer:   [facility]    All questions were answered. The patient knows to call the clinic with any problems, questions or concerns.  A total of more than 30 minutes were spent on this encounter with face-to-face time and non-face-to-face time, including preparing to see the patient, ordering tests and/or medications, counseling the patient and coordination of care as outlined above.   Ulysees Barns, MD Department of Hematology/Oncology [facility] Phone: [phone] Pager: [phone] Email: [email or web address]  08/25/2022 4:46 PM

## 2022-08-26 NOTE — Progress Notes (Signed)
Oley Balm, MD  Leodis Rains D PROCEDURE / BIOPSY REVIEW Date: 08/26/22  Requested Biopsy site: sacral mass left Reason for request: r/o mets Imaging review: Best seen on MR Im 18 Se 14 08/13/22  Decision: Approved Imaging modality to perform: CT Schedule with: Moderate Sedation Schedule for: Any VIR  Additional comments:   Please contact me with questions, concerns, or if issue pertaining to this request arise.  Dayne Oley Balm, MD Vascular and Interventional Radiology Specialists [facility]

## 2022-09-03 DIAGNOSIS — C07 Malignant neoplasm of parotid gland: Secondary | ICD-10-CM | POA: Diagnosis not present

## 2022-09-03 DIAGNOSIS — H9221 Otorrhagia, right ear: Secondary | ICD-10-CM | POA: Diagnosis not present

## 2022-09-10 ENCOUNTER — Other Ambulatory Visit: Payer: Self-pay | Admitting: Radiology

## 2022-09-10 DIAGNOSIS — M899 Disorder of bone, unspecified: Secondary | ICD-10-CM

## 2022-09-11 NOTE — H&P (Signed)
Referring Physician(s): Dorsey,John T IV  Supervising Physician: Roanna Banning  Patient Status:  [facility] OP  Chief Complaint: "I'm having a biopsy"   Subjective: Pt known to IR team from left iliac bone lesion bx on 10/29/20. Pathology was neg for malignancy. He is an 89 yo male with PMH sig for skin cancer, renal stones, HTN, HLD, blindness left eye, hypothyroidism, prostate cancer 90's. Recent pelvic MRI reveals:  1. Enlarging sacral bone lesion extending from S2 through S5 with slightly more inferior extension into the S5 segment measuring 6 cm in craniocaudal dimension compared with 4.8 cm previously. Persistent stable bone lesion in the right superior ilium. Stable 14 mm bone lesion in the left posterior ilium. Overall appearance is most consistent with metastatic disease. 2. Mild osteoarthritis of bilateral hips  He presents today for image guided left sacral mass bx for further evaluation. He denies fever,HA,CP,dyspnea, cough, abd/back pain,N/V; he has had some occ bleeding from rt ear   Past Medical History:  Diagnosis Date   Atypical nevus 09/07/2002   left abdomen-slight   BCC (basal cell carcinoma of skin) 07/19/2014   left sideburn- +margin-exc.   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   Nodular basal cell carcinoma (BCC) 07/16/2015   left scalp-CX35FU   Polycythemia    Prostate cancer  (HCC) 1990's   total prostatectomy and adjuvant radiation. Last PSA was 0.02 in 09/2011   SCC (squamous cell carcinoma) 04/03/2009   forehead-txpbx   SCC (squamous cell carcinoma) 07/16/2015   left front scalp   SCC (squamous cell carcinoma) 07/16/2015   left front scalp-Cx35FU   SCCA (squamous cell carcinoma) of skin 12/20/2019   Right forehead (in situ)   SCCA (squamous cell carcinoma) of skin 05/27/2020   Mid Parietal Scalp (in situ)   Superficial basal cell carcinoma (BCC) 11/21/2012   behind left ear-CX35FU, midback-CX35FU, right forehead-CX35FU-exc   Superficial basal cell carcinoma (BCC) 11/02/2017   left post neck-CX35FU   Superficial nodular basal cell carcinoma (BCC) 12/20/2019   Left temporal scalp   Superficial nodular basal cell carcinoma (BCC) 05/27/2020   Right Preauricular area (curet and 5FU)   Past Surgical History:  Procedure Laterality Date   ADJACENT TISSUE TRANSFER/TISSUE REARRANGEMENT N/A 05/22/2019   Procedure: COMPLEX CLOSURE OF NECK WOUND;  Surgeon: Allena Napoleon, MD;  Location: [facility] OR;  Service: Plastics;  Laterality: N/A;   ALLOGRAFT APPLICATION N/A 05/22/2019   Procedure: POSSIBLE FACIAL NERVE RECONSTRUCTION WITH NERVE ALLOGRAFT VS AUTOGRAFT;  Surgeon: Allena Napoleon, MD;  Location: [facility] OR;  Service: Plastics;  Laterality: N/A;   BLADDER SURGERY     due to prostatectomy.    CATARACT EXTRACTION     right   COLONOSCOPY     neg in the past; due in 2014 with [place] Dr. Jarold Motto   EYE SURGERY  50 years ago.    HERNIA REPAIR     MOHS SURGERY     NECK DISSECTION  05/22/2019    NECK DISSECTION   PAROTIDECTOMY  05/22/2019   PAROTIDECTOMY with facial nerve dissection    PAROTIDECTOMY Right 05/22/2019   Procedure: PAROTIDECTOMY;  Surgeon: Serena Colonel, MD;  Location: [facility] OR;  Service: ENT;  Laterality: Right;   PROSTATE SURGERY     RADICAL NECK DISSECTION Right 05/22/2019   Procedure: RADICAL NECK DISSECTION;  Surgeon: Serena Colonel, MD;  Location: [facility] OR;   Service: ENT;  Laterality: Right;   TONSILLECTOMY         Allergies: Methylprednisolone, Penicillins, Latex, and Tape  Medications: Prior to Admission medications   Medication Sig Start Date End Date Taking? Authorizing Provider  amLODipine (NORVASC) 5 MG tablet Take 5 mg by mouth daily.  04/17/19   [provider]  aspirin 81 MG tablet Take 81 mg by mouth daily.    [provider]  benazepril (LOTENSIN) 10 MG tablet Take 10 mg by mouth daily.    [provider]  famotidine-calcium carbonate-magnesium hydroxide (PEPCID COMPLETE) 10-800-165 MG chewable tablet Take one tablet PRN 01/04/18   [provider]  levothyroxine (SYNTHROID) 25 MCG tablet Take 25 mcg by mouth daily before breakfast.    [provider]  Multiple Vitamin (MULTIVITAMIN) tablet Take 1 tablet by mouth daily.    [provider]  potassium citrate (UROCIT-K) 10 MEQ (1080 MG) SR tablet Take 10 mEq by mouth 3 (three) times daily with meals.    [provider]  simvastatin (ZOCOR) 20 MG tablet Take 20 mg by mouth every evening.    [provider]  promethazine (PHENERGAN) 25 MG suppository Place 1 suppository (25 mg total) rectally every 6 (six) hours as needed for nausea or vomiting. Patient not taking: Reported on 05/31/2019 05/23/19 08/18/19  Serena Colonel, MD     Vital Signs: Vitals:   09/14/22 0709  BP: (!) 141/72  Pulse: 64  Resp: 14  Temp: (!) 97.5 F (36.4 C)  SpO2: 98%       Code Status: FULL CODE  Physical Exam: awake/alert; chest- CTA bilat; heart- RRR; abd- soft,+BS,NT; no LE edema  Imaging: No results found.  Labs:  CBC: Recent Labs    02/05/22 0921 08/25/22 1127  WBC 6.2 6.6  HGB 16.6 15.6  HCT 47.0 45.7  PLT 241 250    COAGS: No results for input(s): "INR", "APTT" in the last 8760 hours.  BMP: Recent Labs    02/05/22 0921 08/25/22 1127  NA 142 141  K 4.7 4.9  CL 106 107  CO2 33* 30  GLUCOSE 93 91  BUN  17 15  CALCIUM 10.7* 10.6*  CREATININE 1.27* 1.27*  GFRNONAA 55* 55*    LIVER FUNCTION TESTS: Recent Labs    02/05/22 0921 08/25/22 1127  BILITOT 0.5 0.7  AST 25 23  ALT 26 24  ALKPHOS 82 76  PROT 6.5 6.4*  ALBUMIN 4.3 4.1    Assessment and Plan:  89 yo male with PMH sig for skin cancer, renal stones, HTN, HLD, blindness left eye, hypothyroidism, prostate cancer 90's. He underwent left iliac bone lesion bx on 10/29/20. Pathology was neg for malignancy. Recent pelvic MRI reveals:  1. Enlarging sacral bone lesion extending from S2 through S5 with slightly more inferior extension into the S5 segment measuring 6 cm in craniocaudal dimension compared with 4.8 cm previously. Persistent stable bone lesion in the right superior  ilium. Stable 14 mm bone lesion in the left posterior ilium. Overall appearance is most consistent with metastatic disease. 2. Mild osteoarthritis of bilateral hips  He presents today for image guided left sacral mass bx for further evaluation.Risks and benefits of procedure was discussed with the patient/son in law  including, but not limited to bleeding, infection, damage to adjacent structures or low yield requiring additional tests.  All of the questions were answered and there is agreement to proceed.  Consent signed and in chart.    Electronically Signed: D. Jeananne Rama, PA-C 09/11/2022, 9:13 AM   I spent a total of 25 minutes at the the patient's bedside AND on the patient's hospital floor or unit, greater than 50% of which was counseling/coordinating care for image guided left sacral mass biopsy

## 2022-09-14 ENCOUNTER — Ambulatory Visit (HOSPITAL_COMMUNITY)
Admission: RE | Admit: 2022-09-14 | Discharge: 2022-09-14 | Disposition: A | Discharge status: Home / Self Care | Payer: Medicare Other | Source: Ambulatory Visit | Attending: Hematology and Oncology | Admitting: Hematology and Oncology

## 2022-09-14 ENCOUNTER — Other Ambulatory Visit: Payer: Self-pay

## 2022-09-14 ENCOUNTER — Encounter (HOSPITAL_COMMUNITY): Payer: Self-pay

## 2022-09-14 DIAGNOSIS — E039 Hypothyroidism, unspecified: Secondary | ICD-10-CM | POA: Diagnosis not present

## 2022-09-14 DIAGNOSIS — M899 Disorder of bone, unspecified: Secondary | ICD-10-CM | POA: Diagnosis not present

## 2022-09-14 DIAGNOSIS — Z8546 Personal history of malignant neoplasm of prostate: Secondary | ICD-10-CM | POA: Diagnosis not present

## 2022-09-14 DIAGNOSIS — Z85828 Personal history of other malignant neoplasm of skin: Secondary | ICD-10-CM | POA: Insufficient documentation

## 2022-09-14 DIAGNOSIS — M160 Bilateral primary osteoarthritis of hip: Secondary | ICD-10-CM | POA: Diagnosis not present

## 2022-09-14 DIAGNOSIS — H5462 Unqualified visual loss, left eye, normal vision right eye: Secondary | ICD-10-CM | POA: Insufficient documentation

## 2022-09-14 DIAGNOSIS — C7B03 Secondary carcinoid tumors of bone: Secondary | ICD-10-CM | POA: Diagnosis not present

## 2022-09-14 DIAGNOSIS — E785 Hyperlipidemia, unspecified: Secondary | ICD-10-CM | POA: Diagnosis not present

## 2022-09-14 DIAGNOSIS — I10 Essential (primary) hypertension: Secondary | ICD-10-CM | POA: Insufficient documentation

## 2022-09-14 LAB — CBC WITH DIFFERENTIAL/PLATELET
Abs Immature Granulocytes: 0.02 10*3/uL (ref 0.00–0.07)
Basophils Absolute: 0 10*3/uL (ref 0.0–0.1)
Basophils Relative: 0 %
Eosinophils Absolute: 0.2 10*3/uL (ref 0.0–0.5)
Eosinophils Relative: 3 %
HCT: 46.8 % (ref 39.0–52.0)
Hemoglobin: 16 g/dL (ref 13.0–17.0)
Immature Granulocytes: 0 %
Lymphocytes Relative: 19 %
Lymphs Abs: 1.4 10*3/uL (ref 0.7–4.0)
MCH: 31.8 pg (ref 26.0–34.0)
MCHC: 34.2 g/dL (ref 30.0–36.0)
MCV: 93 fL (ref 80.0–100.0)
Monocytes Absolute: 0.7 10*3/uL (ref 0.1–1.0)
Monocytes Relative: 9 %
Neutro Abs: 5.1 10*3/uL (ref 1.7–7.7)
Neutrophils Relative %: 69 %
Platelets: 220 10*3/uL (ref 150–400)
RBC: 5.03 MIL/uL (ref 4.22–5.81)
RDW: 13.2 % (ref 11.5–15.5)
WBC: 7.4 10*3/uL (ref 4.0–10.5)
nRBC: 0 % (ref 0.0–0.2)

## 2022-09-14 LAB — BASIC METABOLIC PANEL
Anion gap: 7 (ref 5–15)
BUN: 22 mg/dL (ref 8–23)
CO2: 22 mmol/L (ref 22–32)
Calcium: 9.2 mg/dL (ref 8.9–10.3)
Chloride: 108 mmol/L (ref 98–111)
Creatinine, Ser: 1.21 mg/dL (ref 0.61–1.24)
GFR, Estimated: 58 mL/min — ABNORMAL LOW (ref >60)
Glucose, Bld: 111 mg/dL — ABNORMAL HIGH (ref 70–99)
Potassium: 4.1 mmol/L (ref 3.5–5.1)
Sodium: 137 mmol/L (ref 135–145)

## 2022-09-14 LAB — PROTIME-INR
INR: 1 (ref 0.8–1.2)
Prothrombin Time: 13.5 seconds (ref 11.4–15.2)

## 2022-09-14 MED ORDER — HYDROCODONE-ACETAMINOPHEN 5-325 MG PO TABS: 1.0000 | ORAL_TABLET | ORAL | Status: DC | PRN

## 2022-09-14 MED ORDER — FENTANYL CITRATE (PF) 100 MCG/2ML IJ SOLN
INTRAMUSCULAR | Status: AC | PRN
  Administered 2022-09-14 (×2): 50 ug via INTRAVENOUS

## 2022-09-14 MED ORDER — LIDOCAINE HCL (PF) 1 % IJ SOLN
INTRAMUSCULAR | Status: AC | PRN
  Administered 2022-09-14: 10 mL

## 2022-09-14 MED ORDER — SODIUM CHLORIDE 0.9 % IV SOLN: INTRAVENOUS | Status: DC

## 2022-09-14 MED ORDER — MIDAZOLAM HCL 2 MG/2ML IJ SOLN
INTRAMUSCULAR | Status: AC | PRN
  Administered 2022-09-14 (×2): 1 mg via INTRAVENOUS

## 2022-09-14 MED ORDER — FENTANYL CITRATE (PF) 100 MCG/2ML IJ SOLN
INTRAMUSCULAR | Status: AC
  Filled 2022-09-14: qty 4

## 2022-09-14 MED ORDER — MIDAZOLAM HCL 2 MG/2ML IJ SOLN
INTRAMUSCULAR | Status: AC
  Filled 2022-09-14: qty 4

## 2022-09-14 NOTE — Procedures (Signed)
Vascular and Interventional Radiology Procedure Note  Patient: Martin Curry DOB: 08-08-1932 Medical Record Number: [id number] Note Date/Time: 09/14/22 9:52 AM   Performing Physician: Roanna Banning, MD Assistant(s): None  Diagnosis: Hx prostate CA. L sacral bone lesion   Procedure: LEFT SACRAL BONE LESION BIOPSY  Anesthesia: Conscious Sedation Complications: None Estimated Blood Loss: Minimal Specimens: Sent for Pathology  Findings:  Successful CT-guided biopsy of L sacral bone lesion. A total of 1 samples were obtained. Hemostasis of the tract was achieved using Manual Pressure.  Plan: Bed rest for 1 hours.  See detailed procedure note with images in PACS. The patient tolerated the procedure well without incident or complication and was returned to Recovery in stable condition.    Roanna Banning, MD Vascular and Interventional Radiology Specialists [facility] Radiology   Pager. [phone] Clinic. [phone]

## 2022-09-14 NOTE — Discharge Instructions (Signed)
Please call Interventional Radiology clinic [phone] with any questions or concerns. ? ?You may remove your dressing and shower tomorrow. ? ? ?Bone Marrow Aspiration and Bone Marrow Biopsy, Adult, Care After ?This sheet gives you information about how to care for yourself after your procedure. Your health care provider may also give you more specific instructions. If you have problems or questions, contact your health care provider. ?What can I expect after the procedure? ?After the procedure, it is common to have: ?Mild pain and tenderness. ?Swelling. ?Bruising. ?Follow these instructions at home: ?Puncture site care ?Follow instructions from your health care provider about how to take care of the puncture site. Make sure you: ?Wash your hands with soap and water before and after you change your bandage (dressing). If soap and water are not available, use hand sanitizer. ?Change your dressing as told by your health care provider. ?Check your puncture site every day for signs of infection. Check for: ?More redness, swelling, or pain. ?Fluid or blood. ?Warmth. ?Pus or a bad smell.   ?Activity ?Return to your normal activities as told by your health care provider. Ask your health care provider what activities are safe for you. ?Do not lift anything that is heavier than 10 lb (4.5 kg), or the limit that you are told, until your health care provider says that it is safe. ?Do not drive for 24 hours if you were given a sedative during your procedure. ?General instructions ?Take over-the-counter and prescription medicines only as told by your health care provider. ?Do not take baths, swim, or use a hot tub until your health care provider approves. Ask your health care provider if you may take showers. You may only be allowed to take sponge baths. ?If directed, put ice on the affected area. To do this: ?Put ice in a plastic bag. ?Place a towel between your skin and the bag. ?Leave the ice on for 20 minutes, 2-3 times a  day. ?Keep all follow-up visits as told by your health care provider. This is important.   ?Contact a health care provider if: ?Your pain is not controlled with medicine. ?You have a fever. ?You have more redness, swelling, or pain around the puncture site. ?You have fluid or blood coming from the puncture site. ?Your puncture site feels warm to the touch. ?You have pus or a bad smell coming from the puncture site. ?Summary ?After the procedure, it is common to have mild pain, tenderness, swelling, and bruising. ?Follow instructions from your health care provider about how to take care of the puncture site and what activities are safe for you. ?Take over-the-counter and prescription medicines only as told by your health care provider. ?Contact a health care provider if you have any signs of infection, such as fluid or blood coming from the puncture site. ?This information is not intended to replace advice given to you by your health care provider. Make sure you discuss any questions you have with your health care provider. ?Document Revised: 06/28/2018 Document Reviewed: 06/28/2018 ?Elsevier Patient Education ? 2021 [employer] ? ? ?Moderate Conscious Sedation, Adult, Care After ?This sheet gives you information about how to care for yourself after your procedure. Your health care provider may also give you more specific instructions. If you have problems or questions, contact your health care provider. ?What can I expect after the procedure? ?After the procedure, it is common to have: ?Sleepiness for several hours. ?Impaired judgment for several hours. ?Difficulty with balance. ?Vomiting if you eat too   soon. ?Follow these instructions at home: ?For the time period you were told by your health care provider: ?Rest. ?Do not participate in activities where you could fall or become injured. ?Do not drive or use machinery. ?Do not drink alcohol. ?Do not take sleeping pills or medicines that cause drowsiness. ?Do not  make important decisions or sign legal documents. ?Do not take care of children on your own.  ?  ?  ?Eating and drinking ?Follow the diet recommended by your health care provider. ?Drink enough fluid to keep your urine pale yellow. ?If you vomit: ?Drink water, juice, or soup when you can drink without vomiting. ?Make sure you have little or no nausea before eating solid foods.   ?General instructions ?Take over-the-counter and prescription medicines only as told by your health care provider. ?Have a responsible adult stay with you for the time you are told. It is important to have someone help care for you until you are awake and alert. ?Do not smoke. ?Keep all follow-up visits as told by your health care provider. This is important. ?Contact a health care provider if: ?You are still sleepy or having trouble with balance after 24 hours. ?You feel light-headed. ?You keep feeling nauseous or you keep vomiting. ?You develop a rash. ?You have a fever. ?You have redness or swelling around the IV site. ?Get help right away if: ?You have trouble breathing. ?You have new-onset confusion at home. ?Summary ?After the procedure, it is common to feel sleepy, have impaired judgment, or feel nauseous if you eat too soon. ?Rest after you get home. Know the things you should not do after the procedure. ?Follow the diet recommended by your health care provider and drink enough fluid to keep your urine pale yellow. ?Get help right away if you have trouble breathing or new-onset confusion at home. ?This information is not intended to replace advice given to you by your health care provider. Make sure you discuss any questions you have with your health care provider. ?Document Revised: 06/09/2019 Document Reviewed: 01/05/2019 ?Elsevier Patient Education ? 2021 [employer]  ?

## 2022-09-17 ENCOUNTER — Telehealth: Payer: Self-pay | Admitting: Hematology and Oncology

## 2022-09-17 ENCOUNTER — Telehealth: Payer: Self-pay | Admitting: *Deleted

## 2022-09-17 DIAGNOSIS — M899 Disorder of bone, unspecified: Secondary | ICD-10-CM

## 2022-09-17 DIAGNOSIS — H9221 Otorrhagia, right ear: Secondary | ICD-10-CM | POA: Diagnosis not present

## 2022-09-17 DIAGNOSIS — Z9622 Myringotomy tube(s) status: Secondary | ICD-10-CM | POA: Diagnosis not present

## 2022-09-17 NOTE — Telephone Encounter (Signed)
Called Mr. Kosik to discuss the results of his biopsy.  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  At this time the etiology is unclear but could include skin, head neck origin, or lung origin.  Will order a full body PET CT scan in order to find primary lesion.  Patient voices understanding of our findings and the plan moving forward.  Ulysees Barns, MD Department of Hematology/Oncology [facility] Phone: [phone] Pager: [phone] Email: [email or web address]

## 2022-09-17 NOTE — Telephone Encounter (Signed)
Received vm message from pt inquiring about f/I appt. He had bone biopsy doen on 09/14/22. Results are available.  Please advise

## 2022-09-24 DIAGNOSIS — H9221 Otorrhagia, right ear: Secondary | ICD-10-CM | POA: Diagnosis not present

## 2022-09-24 DIAGNOSIS — C07 Malignant neoplasm of parotid gland: Secondary | ICD-10-CM | POA: Diagnosis not present

## 2022-10-02 ENCOUNTER — Encounter (HOSPITAL_COMMUNITY)
Admission: RE | Admit: 2022-10-02 | Discharge: 2022-10-02 | Disposition: A | Discharge status: Home / Self Care | Payer: Medicare Other | Source: Ambulatory Visit | Attending: Hematology and Oncology | Admitting: Hematology and Oncology

## 2022-10-02 ENCOUNTER — Ambulatory Visit (HOSPITAL_COMMUNITY)
Admission: RE | Admit: 2022-10-02 | Discharge: 2022-10-02 | Disposition: A | Discharge status: Home / Self Care | Payer: Medicare Other | Source: Ambulatory Visit | Attending: Hematology and Oncology | Admitting: Hematology and Oncology

## 2022-10-02 ENCOUNTER — Encounter (HOSPITAL_COMMUNITY): Payer: Self-pay

## 2022-10-02 DIAGNOSIS — M899 Disorder of bone, unspecified: Secondary | ICD-10-CM | POA: Insufficient documentation

## 2022-10-02 DIAGNOSIS — C7951 Secondary malignant neoplasm of bone: Secondary | ICD-10-CM | POA: Diagnosis not present

## 2022-10-02 DIAGNOSIS — C4441 Basal cell carcinoma of skin of scalp and neck: Secondary | ICD-10-CM | POA: Diagnosis not present

## 2022-10-02 DIAGNOSIS — C61 Malignant neoplasm of prostate: Secondary | ICD-10-CM | POA: Diagnosis not present

## 2022-10-02 LAB — GLUCOSE, CAPILLARY: Glucose-Capillary: 95 mg/dL (ref 70–99)

## 2022-10-02 MED ORDER — FLUDEOXYGLUCOSE F - 18 (FDG) INJECTION
8.6680 | Freq: Once | INTRAVENOUS | Status: AC
  Administered 2022-10-02: 8.668 via INTRAVENOUS

## 2022-10-05 DIAGNOSIS — D2262 Melanocytic nevi of left upper limb, including shoulder: Secondary | ICD-10-CM | POA: Diagnosis not present

## 2022-10-05 DIAGNOSIS — C4441 Basal cell carcinoma of skin of scalp and neck: Secondary | ICD-10-CM | POA: Diagnosis not present

## 2022-10-05 DIAGNOSIS — D2261 Melanocytic nevi of right upper limb, including shoulder: Secondary | ICD-10-CM | POA: Diagnosis not present

## 2022-10-05 DIAGNOSIS — Z85828 Personal history of other malignant neoplasm of skin: Secondary | ICD-10-CM | POA: Diagnosis not present

## 2022-10-05 DIAGNOSIS — C44319 Basal cell carcinoma of skin of other parts of face: Secondary | ICD-10-CM | POA: Diagnosis not present

## 2022-10-05 DIAGNOSIS — L570 Actinic keratosis: Secondary | ICD-10-CM | POA: Diagnosis not present

## 2022-10-09 ENCOUNTER — Telehealth: Payer: Self-pay | Admitting: *Deleted

## 2022-10-09 NOTE — Telephone Encounter (Signed)
Received call from pt. He states he had his PET Scan done and results are available. He is asking for an appt with Dr. Leonides Schanz to discuss results and next steps. Advised he can be seen on 10/14/22 @ 8:45 am for labs and 9:20 am with Dr. Leonides Schanz. Pt voiced understanding. He will have his wife and daughter with him. Advised that this is ok.

## 2022-10-14 ENCOUNTER — Inpatient Hospital Stay: Payer: Medicare Other | Attending: Hematology and Oncology

## 2022-10-14 ENCOUNTER — Other Ambulatory Visit: Payer: Self-pay | Admitting: Hematology and Oncology

## 2022-10-14 ENCOUNTER — Inpatient Hospital Stay: Payer: Medicare Other | Admitting: Hematology and Oncology

## 2022-10-14 VITALS — BP 133/71 | HR 81 | Temp 97.9°F | Resp 15 | Wt 180.9 lb

## 2022-10-14 DIAGNOSIS — C7951 Secondary malignant neoplasm of bone: Secondary | ICD-10-CM | POA: Diagnosis not present

## 2022-10-14 DIAGNOSIS — E041 Nontoxic single thyroid nodule: Secondary | ICD-10-CM | POA: Diagnosis not present

## 2022-10-14 DIAGNOSIS — C4441 Basal cell carcinoma of skin of scalp and neck: Secondary | ICD-10-CM | POA: Diagnosis not present

## 2022-10-14 DIAGNOSIS — M899 Disorder of bone, unspecified: Secondary | ICD-10-CM

## 2022-10-14 LAB — CMP (CANCER CENTER ONLY)
ALT: 24 U/L (ref 0–44)
AST: 22 U/L (ref 15–41)
Albumin: 4.3 g/dL (ref 3.5–5.0)
Alkaline Phosphatase: 75 U/L (ref 38–126)
Anion gap: 6 (ref 5–15)
BUN: 18 mg/dL (ref 8–23)
CO2: 30 mmol/L (ref 22–32)
Calcium: 9.9 mg/dL (ref 8.9–10.3)
Chloride: 104 mmol/L (ref 98–111)
Creatinine: 1.36 mg/dL — ABNORMAL HIGH (ref 0.61–1.24)
GFR, Estimated: 50 mL/min — ABNORMAL LOW (ref >60)
Glucose, Bld: 124 mg/dL — ABNORMAL HIGH (ref 70–99)
Potassium: 4.2 mmol/L (ref 3.5–5.1)
Sodium: 140 mmol/L (ref 135–145)
Total Bilirubin: 0.6 mg/dL (ref 0.3–1.2)
Total Protein: 7 g/dL (ref 6.5–8.1)

## 2022-10-14 LAB — CBC WITH DIFFERENTIAL (CANCER CENTER ONLY)
Abs Immature Granulocytes: 0.04 10*3/uL (ref 0.00–0.07)
Basophils Absolute: 0 10*3/uL (ref 0.0–0.1)
Basophils Relative: 0 %
Eosinophils Absolute: 0.1 10*3/uL (ref 0.0–0.5)
Eosinophils Relative: 1 %
HCT: 46.5 % (ref 39.0–52.0)
Hemoglobin: 15.9 g/dL (ref 13.0–17.0)
Immature Granulocytes: 1 %
Lymphocytes Relative: 7 %
Lymphs Abs: 0.5 10*3/uL — ABNORMAL LOW (ref 0.7–4.0)
MCH: 31.9 pg (ref 26.0–34.0)
MCHC: 34.2 g/dL (ref 30.0–36.0)
MCV: 93.2 fL (ref 80.0–100.0)
Monocytes Absolute: 0.6 10*3/uL (ref 0.1–1.0)
Monocytes Relative: 8 %
Neutro Abs: 6.5 10*3/uL (ref 1.7–7.7)
Neutrophils Relative %: 83 %
Platelet Count: 232 10*3/uL (ref 150–400)
RBC: 4.99 MIL/uL (ref 4.22–5.81)
RDW: 13.2 % (ref 11.5–15.5)
WBC Count: 7.8 10*3/uL (ref 4.0–10.5)
nRBC: 0 % (ref 0.0–0.2)

## 2022-10-14 LAB — TSH: TSH: 2.694 u[IU]/mL (ref 0.350–4.500)

## 2022-10-14 NOTE — Progress Notes (Signed)
[facility] [phone]   [phone]  PROGRESS NOTE  Patient Care Team: Garlan Fillers, MD as PCP - General (Internal Medicine) Garlan Fillers, MD as Consulting Physician (Internal Medicine) Exie Parody, MD (Hematology and Oncology) Bjorn Pippin, MD as Attending Physician (Urology) Mardella Layman, MD as Attending Physician (Gastroenterology) Janalyn Harder, MD (Inactive) as Consulting Physician (Dermatology) Lonie Peak, MD as Attending Physician (Radiation Oncology) Barrie Folk, RN (Inactive) as Oncology Nurse Navigator Malmfelt, Lise Auer, RN as Oncology Nurse Navigator (Oncology)  Hematological/Oncological History # Bone Lesions on CT/MRI 08/30/2020: CT Pelvis W contrast showed 1.2 cm lucent lesion over the superior left iliac bone adjacent the sacroiliac joint corresponding to signal abnormality seen on MRI 09/19/2020: MRI pelvis WWO showed Marrow replacing lesions within the left iliac bone and sacrum. Additionally there was noted to be a new enhancing lesion at the tip of the right greater trochanter measuring up to 1.7 cm also suspicious for metastatic disease. 09/27/2020: establish care with Dr. Leonides Schanz 10/29/2020: CT biopsy performed of left iliac bone lesion. Pathology showed bone and marrow without evidence of carcinoma.   Interval History:  Martin Curry 89 y.o. male with medical history significant for bone lesions noted on CT/MRI who presents for a follow up visit. The patient's last visit was on 08/06/2021. In the interim since the last visit he completed a biopsy and PET CT scan.  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  He reports he has been well overall in the room since her last visit.  He notes that he does have some confusion because he notes he  has history of basal cell carcinoma with extensive resections and removals but he is unsure how a secondary cancer could have developed as he was not notified of any squamous cell malignancies.  He reports that he has undergone surgery and radiation before back in 2021.  This was all reportedly due to basal cell carcinoma behind his right ear.  He notes he is not currently in any pain and overall he feels well..  Overall he is at his baseline level of health and has no questions concerns or complaints today.  He denies any fevers, chills, sweats, nausea, vomiting or diarrhea.  His weight is been stable.  A full 10 point ROS is listed below.  Today we discussed the findings on his biopsy and PET CT scan.  We discussed steps moving forward and the need to discuss with pathology whether or not these 2 lesions potentially represent the same malignancy.  ENT and radiation oncology are also currently involved and available to assist for treatment options.  Also of note there was some hypermetabolism noted in the colon for which colonoscopy was recommended.   MEDICAL HISTORY:  Past Medical History:  Diagnosis Date   Atypical nevus 09/07/2002   left abdomen-slight   BCC (basal cell carcinoma of skin) 07/19/2014   left sideburn- +margin-exc.   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   Nodular basal cell carcinoma (BCC)  07/16/2015   left scalp-CX35FU   Polycythemia    Prostate cancer (HCC) 1990's   total prostatectomy and adjuvant radiation. Last PSA was 0.02 in 09/2011   SCC (squamous cell carcinoma) 04/03/2009   forehead-txpbx   SCC (squamous cell carcinoma) 07/16/2015   left front scalp   SCC (squamous cell carcinoma) 07/16/2015   left front scalp-Cx35FU   SCCA (squamous cell carcinoma) of skin 12/20/2019   Right forehead (in situ)   SCCA (squamous cell carcinoma) of skin 05/27/2020   Mid Parietal Scalp (in situ)   Superficial basal cell carcinoma (BCC) 11/21/2012   behind left ear-CX35FU, midback-CX35FU, right forehead-CX35FU-exc   Superficial basal cell carcinoma (BCC) 11/02/2017   left post neck-CX35FU   Superficial nodular basal cell carcinoma (BCC) 12/20/2019   Left temporal scalp   Superficial nodular basal cell carcinoma (BCC) 05/27/2020   Right Preauricular area (curet and 5FU)    SURGICAL HISTORY: Past Surgical History:  Procedure Laterality Date   ADJACENT TISSUE TRANSFER/TISSUE REARRANGEMENT N/A 05/22/2019   Procedure: COMPLEX CLOSURE OF NECK WOUND;  Surgeon: Allena Napoleon, MD;  Location: [facility] OR;  Service: Plastics;  Laterality: N/A;   ALLOGRAFT APPLICATION N/A 05/22/2019   Procedure: POSSIBLE FACIAL NERVE RECONSTRUCTION WITH NERVE ALLOGRAFT VS AUTOGRAFT;  Surgeon: Allena Napoleon, MD;  Location: [facility] OR;  Service: Plastics;  Laterality: N/A;   BLADDER SURGERY     due to prostatectomy.    CATARACT EXTRACTION     right   COLONOSCOPY     neg in the past; due in 2014 with [place] Dr. Jarold Motto   EYE SURGERY     50 years ago.    HERNIA REPAIR     MOHS SURGERY     NECK DISSECTION  05/22/2019    NECK DISSECTION   PAROTIDECTOMY  05/22/2019   PAROTIDECTOMY with facial nerve dissection    PAROTIDECTOMY Right 05/22/2019   Procedure: PAROTIDECTOMY;  Surgeon: Serena Colonel, MD;  Location: [facility] OR;  Service: ENT;  Laterality: Right;   PROSTATE SURGERY     RADICAL NECK DISSECTION Right  05/22/2019   Procedure: RADICAL NECK DISSECTION;  Surgeon: Serena Colonel, MD;  Location: [facility] OR;  Service: ENT;  Laterality: Right;   TONSILLECTOMY      SOCIAL HISTORY: Social History   Socioeconomic History   Marital status: Married    Spouse name: Not on file   Number of children: 3   Years of education: Not on file   Highest education level: Not on file  Occupational History    Comment: retired Scientist, [employer]; 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  Tobacco Use   Smoking status: Never   Smokeless tobacco: Never  Vaping Use   Vaping status: Never Used  Substance and Sexual Activity   Alcohol use: No   Drug use: No   Sexual activity: Not on file  Other Topics Concern   Not on file  Social History Narrative   Not on file   Social Determinants of Health   Financial Resource Strain: Not on file  Food Insecurity: Low Risk  (06/30/2022)   [facility]   Hunger Vital  Sign    Worried About Programme researcher, broadcasting/film/video in the Last Year: Never true    Ran Out of Food in the Last Year: Never true  Transportation Needs: No Transportation Needs (06/30/2022)   [facility]   Transportation    In the past 12 months, has lack of reliable transportation kept you from medical appointments, meetings, work or from getting things needed for daily living? : No  Physical Activity: Not on file  Stress: Not on file  Social Connections: Not on file  Intimate Partner Violence: Not on file    FAMILY HISTORY: Family History  Problem Relation Age of Onset   Uterine cancer Mother 72   Lung cancer Father 66   Prostate cancer Brother    Prostate cancer Brother     ALLERGIES:  is allergic to methylprednisolone, penicillins, latex, and tape.  MEDICATIONS:  Current Outpatient Medications  Medication Sig Dispense Refill   amLODipine (NORVASC) 5 MG tablet Take 5 mg by mouth daily.      aspirin 81 MG tablet Take 81 mg by mouth daily.     benazepril (LOTENSIN) 10 MG  tablet Take 10 mg by mouth daily.     famotidine-calcium carbonate-magnesium hydroxide (PEPCID COMPLETE) 10-800-165 MG chewable tablet Take one tablet PRN     levothyroxine (SYNTHROID) 25 MCG tablet Take 25 mcg by mouth daily before breakfast.     Multiple Vitamin (MULTIVITAMIN) tablet Take 1 tablet by mouth daily.     potassium citrate (UROCIT-K) 10 MEQ (1080 MG) SR tablet Take 10 mEq by mouth 3 (three) times daily with meals.     simvastatin (ZOCOR) 20 MG tablet Take 20 mg by mouth every evening.     No current facility-administered medications for this visit.    REVIEW OF SYSTEMS:   Constitutional: ( - ) fevers, ( - )  chills , ( - ) night sweats Eyes: ( - ) blurriness of vision, ( - ) double vision, ( - ) watery eyes Ears, nose, mouth, throat, and face: ( - ) mucositis, ( - ) sore throat Respiratory: ( - ) cough, ( - ) dyspnea, ( - ) wheezes Cardiovascular: ( - ) palpitation, ( - ) chest discomfort, ( - ) lower extremity swelling Gastrointestinal:  ( - ) nausea, ( - ) heartburn, ( - ) change in bowel habits Skin: ( - ) abnormal skin rashes Lymphatics: ( - ) new lymphadenopathy, ( - ) easy bruising Neurological: ( - ) numbness, ( - ) tingling, ( - ) new weaknesses Behavioral/Psych: ( - ) mood change, ( - ) new changes  All other systems were reviewed with the patient and are negative.  PHYSICAL EXAMINATION: ECOG PERFORMANCE STATUS: 1 - Symptomatic but completely ambulatory  Vitals:   10/14/22 0850  BP: 133/71  Pulse: 81  Resp: 15  Temp: 97.9 F (36.6 C)  SpO2: 97%   Filed Weights   10/14/22 0850  Weight: 180 lb 14.4 oz (82.1 kg)    GENERAL: Well-appearing elderly Caucasian male, alert, no distress and comfortable SKIN: skin color, texture, turgor are normal, no rashes or significant lesions EYES: conjunctiva are pink and non-injected, sclera clear LUNGS: clear to auscultation and percussion with normal breathing effort HEART: regular rate & rhythm and no murmurs and no  lower extremity edema Musculoskeletal: no cyanosis of digits and no clubbing  PSYCH: alert & oriented x 3, fluent speech NEURO: no focal motor/sensory deficits  LABORATORY DATA:  I have reviewed  the data as listed    Latest Ref Rng & Units 10/14/2022    8:31 AM 09/14/2022    7:30 AM 08/25/2022   11:27 AM  CBC  WBC 4.0 - 10.5 K/uL 7.8  7.4  6.6   Hemoglobin 13.0 - 17.0 g/dL 11.9  14.7  82.9   Hematocrit 39.0 - 52.0 % 46.5  46.8  45.7   Platelets 150 - 400 K/uL 232  220  250        Latest Ref Rng & Units 10/14/2022    8:31 AM 09/14/2022    7:30 AM 08/25/2022   11:27 AM  CMP  Glucose 70 - 99 mg/dL 562  130  91   BUN 8 - 23 mg/dL 18  22  15    Creatinine 0.61 - 1.24 mg/dL 8.65  7.84  6.96   Sodium 135 - 145 mmol/L 140  137  141   Potassium 3.5 - 5.1 mmol/L 4.2  4.1  4.9   Chloride 98 - 111 mmol/L 104  108  107   CO2 22 - 32 mmol/L 30  22  30    Calcium 8.9 - 10.3 mg/dL 9.9  9.2  29.5   Total Protein 6.5 - 8.1 g/dL 7.0   6.4   Total Bilirubin 0.3 - 1.2 mg/dL 0.6   0.7   Alkaline Phos 38 - 126 U/L 75   76   AST 15 - 41 U/L 22   23   ALT 0 - 44 U/L 24   24     Lab Results  Component Value Date   MPROTEIN Not Observed 09/27/2020   Lab Results  Component Value Date   KPAFRELGTCHN 25.6 (H) 09/27/2020   LAMBDASER 23.0 09/27/2020   KAPLAMBRATIO 1.11 09/27/2020    RADIOGRAPHIC STUDIES: NM PET Image Initial (PI) Whole Body  Result Date: 10/08/2022 CLINICAL DATA:  Initial treatment strategy for bone lesion, status post biopsy demonstrating squamous cell carcinoma metastasis. History of prostate cancer. EXAM: NUCLEAR MEDICINE PET WHOLE BODY TECHNIQUE: 9.1 mCi F-18 FDG was injected intravenously. Full-ring PET imaging was performed from the head to foot after the radiotracer. CT data was obtained and used for attenuation correction and anatomic localization. Fasting blood glucose: 111. Mg/dl COMPARISON:  Chest abdomen and pelvic CTs of 10/09/2020. Pelvic MRI of 08/13/2022. neck CT  02/02/2020 also reviewed. FINDINGS: Mediastinal blood pool activity: SUV max 2.5 HEAD/NECK: A dominant mass centered within and caudal to the inferomedial right mastoid air cells measures on the order of 4.4 x 4.6 cm and a S.U.V. max of 36.2 on 45/4. No cervical nodal hypermetabolism. Right-sided thyroid nodule measures 2.3 x 2.6 cm and a S.U.V. max of 3.3 on 77/4. Relatively similar in size and morphology back on 02/02/2020. Incidental CT findings: Bilateral carotid atherosclerosis. No cervical adenopathy. Right mastoid effusion. CHEST: No pulmonary parenchymal hypermetabolism. Bilateral hypermetabolic hilar and mediastinal nodes, with faint calcification within. Index node within the subcarinal station with extension into the azygoesophageal recess measures 1.4 cm and a S.U.V. max of 10.7 on 104/4. Similar in size on 10/09/2020 Mild distal esophageal hypermetabolism in the setting of a tiny hiatal hernia, likely related to esophagitis. Incidental CT findings: Aortic and coronary artery calcification. Mild ascending aortic dilatation at 4.3 cm, similar. ABDOMEN/PELVIS: No abdominopelvic nodal hypermetabolism. Hypermetabolism within the cecal/ascending colonic junction corresponding to subtle soft tissue fullness. Example at a S.U.V. max of 23.1 on 178/4. Presumably urinary contamination about the penis and perineum. Incidental CT findings: Normal adrenal glands. Left renal  low-density lesions are likely cysts at up to 6.7 cm . In the absence of clinically indicated signs/symptoms require(s) no independent follow-up. 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 Scattered colonic diverticula. Left inguinal hernia contains nonobstructive sigmoid colon. Prostatectomy. SKELETON: Sclerotic inferior sacral/coccygeal hypermetabolic lesion including at a S.U.V. max of 11.2 on 199/4. A sclerotic L1 lesion measures a S.U.V. max of 8.6. Incidental CT findings: none EXTREMITIES: No areas of abnormal hypermetabolism.  Incidental CT findings: none IMPRESSION: 1. Hypermetabolic osseous metastasis. 2. Dominant mass centered about the inferior medial portion of the right mastoid, suspicious for site of primary squamous cell carcinoma. 3. Hypermetabolic thoracic adenopathy. Although this could represent nodal metastasis, relative size stability since 10/09/2020 and faint nodal calcification suggest alternative etiologies such as sarcoidosis. 4. Ascending colonic hypermetabolism and soft tissue fullness are highly suspicious for colonic polyp or carcinoma. 5. Right thyroid nodule is hypermetabolic but stable in size since a CT of 02/02/2020. Given patient age and comorbidities, of questionable clinical significance. If further evaluation is desired, thyroid ultrasound and possible biopsy could be performed. 6. Incidental findings, including: Tiny hiatal hernia. Similar ascending aortic dilatation. Coronary artery atherosclerosis. Aortic Atherosclerosis (ICD10-I70.0). Right renal artery aneurysm. Left inguinal hernia containing nonobstructive sigmoid. Electronically Signed   By: Jeronimo Greaves [name]   On: 10/08/2022 11:17    ASSESSMENT & PLAN Martin Curry 89 y.o. 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  At this time the patient's clinical findings are complicated.  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  It is unclear if these 2 processes are related and we will need to have pathology further clarify if the pathology of these 2 are similar or 2 separate primaries.  Additionally there is no clear evidence of a primary squamous cell carcinoma (however my concern is that the area behind the right ear may represent the same process).  Patient also does have PET avid areas of his bowels which I do not suspect are  related, but we will have evaluated further with EGD/colonoscopy as needed.  We do have his tissue out for PD-L1 testing to determine if he would be a good candidate for immunotherapy.  Overall he voices understanding of this complicated situation and our plan moving forward.  Of note radiation oncology and ENT are on board and willing to assist with management of his current findings.   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 Previously discussed with radiology who notes a repeat biopsy was not recommended and repeat imaging would be preferred.  --negative multiple myeloma labs with SPEP and serum free light chains showing no evidence of monoclonal gammopathy.  --Given his prior history of prostate cancer we ordered a PSA, which was normal. Repeated PSA previously, found to be within normal limits --CT C/A/P shows no evidence of disease elsewhere in the body in August 2022. Most recent PET CT scan on 10/02/2022 showed hypermetabolic osseous metastasis with a dominant mass centered about the inferior medial portion of the right mastoid, suspicious for site of primary squamous cell carcinoma. --Labs today show white blood cell count 7.8, hemoglobin 15.9, MCV 93.2, and platelets of 232 -- PET CT scan shows evidence of  lesion behind the right ear as well as the known pelvic metastasis. -- Currently having tissue reviewed by pathology to determine if both lesions are the same pathology or different origins. -- PD-L1 testing requested as well to determine if his squamous cell is a candidate for immunotherapy. -- Return to clinic pending the results of the above evaluation.  No orders of the defined types were placed in this encounter.   All questions were answered. The patient knows to call the clinic with any problems, questions or concerns.  A total of more than 40 minutes were spent on this  encounter with face-to-face time and non-face-to-face time, including preparing to see the patient, ordering tests and/or medications, counseling the patient and coordination of care as outlined above.   Ulysees Barns, MD Department of Hematology/Oncology [facility] at [facility] Phone: [phone] Pager: [phone] Email: [email or web address]  10/24/2022 12:45 PM

## 2022-10-15 LAB — T4: T4, Total: 8.5 ug/dL (ref 4.5–12.0)

## 2022-10-27 ENCOUNTER — Telehealth: Payer: Self-pay | Admitting: *Deleted

## 2022-10-27 NOTE — Telephone Encounter (Signed)
Patient called as he saw the PET scan results populated in the system.  He has an appt on Monday to discuss and did ask if there were any earlier appts however, none are presently available.  He asked if Dr Leonides Schanz would consider calling him to discuss the severity of what he is reading on the report to give him some idea of what to expect prior to Monday he would be greatly appreciative of his time.

## 2022-10-28 ENCOUNTER — Telehealth: Payer: Self-pay | Admitting: Radiation Oncology

## 2022-10-28 NOTE — Telephone Encounter (Signed)
Unable to LVM to schedule RECON with Dr. Basilio Cairo

## 2022-10-28 NOTE — Telephone Encounter (Signed)
Spoke to pt to schedule RECON with Dr. Basilio Cairo. Pt aware of SIM for 9/9 as well per Dr. Colletta Maryland request

## 2022-10-29 ENCOUNTER — Telehealth: Payer: Self-pay

## 2022-10-29 ENCOUNTER — Encounter: Payer: Self-pay | Admitting: Radiation Oncology

## 2022-10-29 NOTE — Progress Notes (Signed)
Radiation Oncology         [phone] ________________________________  Outpatient Re-Consultation  Name: Martin Curry DOBIN MRN: [id number]  Date: 10/30/2022  DOB: [birth year]  QM:VHQIONGE, Barry Dienes, MD  Jaci Standard, MD   REFERRING PHYSICIAN: Jaci Standard, MD  DIAGNOSIS:    ICD-10-CM   1. Metastasis to bone (HCC)  C79.51     2. 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 - now with PET evidence of a dominant mass centered about the inferior medial portion of the right mastoid c/w recurrent basal cell carcinoma  History of BCC of the right neck/scalp    Cancer Staging  Basal cell carcinoma (BCC) of skin of neck Staging form: Cutaneous Carcinoma of the Head and Neck, AJCC 8th Edition - Clinical stage from 05/02/2019: Stage III (cT3, cN0, cM0) - Signed by Lonie Peak, MD on 05/03/2019 Stage prefix: Initial diagnosis Extraosseous extension: Absent - Pathologic stage from 06/16/2019: Stage III (pT3, pN1, cM0) - Signed by Lonie Peak, MD on 06/20/2019 Extraosseous extension: Absent   CHIEF COMPLAINT: Here to discuss management of osseous metastatic disease (SCC) from an unknown primary.   HISTORY OF PRESENT ILLNESS::Martin Curry is a 89 y.o. male who presents today for consideration of radiation therapy in management of osseous metastatic disease (SCC) to the left sacrum and a right mastoid mass suspicious for a primary site of SCC.   He was last seen here for follow-up on 11/08/2020 after completing radiation to the right neck/scalp in June 2021. He was in remission at that time and without any significant long-term side effects from radiation. He was also being followed by ENT, Dr. Pollyann Kennedy for his history of [facility], as well as medical oncology, Dr. Leonides Schanz, for the bone marrow replacing lesions in the bony pelvis. To review, biopsies of the lesions  collected on 10/29/20 were negative for malignancy and without a clear etiology.   He subsequently continued with surveillance imaging for observation of the indeterminate bone lesions (dates and results detailed as follows: -- MRI of the pelvis on 02/02/21 showed stable enhancing metastatic lesions in the posterior left iliac bone and sacrum, a decrease in conspicuity of the previously described lesion at the tip of the right greater trochanter, and minimal residual marrow edema at site of previously described subchondral fracture at the left femoral head posteriorly, suggestive of partial healing. No new marrow replacing bone lesion were appreciated overall.  -- MRI of the pelvis on 08/03/21 showed no substantial change in the two enhancing lesions in the bony pelvis and single enhancing lesion in the right greater trochanter, compatible with previous metastatic foci. Again, no new lesions were identified.  -- MRI of the pelvis on 02/14/22 however showed an enlarging 4.8 cm sacral bone lesion and a new lesion in the anterior superior right iliac bone suspicious for metastatic disease. MRI otherwise showed stability of the previously biopsied left posterior iliac bone lesion.   -- Dr. Leonides Schanz recommended proceeding with repeat imaging in 6 months and the patient subsequently presented for a repeat MRI of the pelvis on 08/13/22 which demonstrated: an enlarging sacral bone lesion extending from S2 through S5 with slightly more inferior extension into the S5 segment measuring 6 cm in craniocaudal dimension (previously measuring 4.8 cm). MRI otherwise showed interval stability of the right superior iliac lesion and left posterior iliac lesion.  Accordingly, the patient proceeded with bone marrow biopsies of the left sacral lesion on 09/14/22. Pathology revealed metastatic poorly differentiated non-small cell carcinoma compatible with squamous carcinoma. PDL1 clone analysis was performed and showed a CPS score  of 0%.   Based on biopsy findings, the patient presented for a PET scan on 10/02/2022 which further revealed a hypermetabolic osseous metastasis with a dominant mass centered about the inferior medial portion of the right mastoid, suspicious for a primary site of squamous cell carcinoma. PET also demonstrated hypermetabolic but stable thoracic adenopathy (could be a benign process such as granulomatous disease) , an ascending colonic hypermetabolism and soft tissue fullness suspicious for a colonic polyp or carcinoma (referral has been made to gastroenterology), and a hypermetabolic but stable right thyroid nodule since December 2021.  He also has evidence of metastatic disease in some of the bones of the lumbosacral spine which I reviewed with him today  The patient recently followed up with Dr. Leonides Schanz on 10/14/22 and he sees him again on September 9.    PREVIOUS RADIATION THERAPY: Yes   Intent: Curative  Radiation Treatment Dates: 06/28/2019 through 07/26/2019   Site Technique Total Dose (Gy) Dose per Fx (Gy) Completed Fx Beam Energies  Neck Right: HN_Rt IMRT 54/54 2.7 20/20 6X    PAST MEDICAL HISTORY:  has a past medical history of Atypical nevus (09/07/2002), BCC (basal cell carcinoma of skin) (07/19/2014), BCC (basal cell carcinoma) (09/07/2002), BCC (basal cell carcinoma) (10/30/2003), BCC (basal cell carcinoma) (01/07/2009), BCC (basal cell carcinoma) (09/27/2014), BCC (basal cell carcinoma) (07/16/2015), Blind left eye, History of kidney stones, Hyperlipidemia, Hypertension, Hypothyroidism, Nodular basal cell carcinoma (BCC) (11/21/2012), Nodular basal cell carcinoma (BCC) (06/12/2014), Nodular basal cell carcinoma (BCC) (07/16/2015), Polycythemia, Prostate cancer (HCC) (1990's), SCC (squamous cell carcinoma) (04/03/2009), SCC (squamous cell carcinoma) (07/16/2015), SCC (squamous cell carcinoma) (07/16/2015), SCCA (squamous cell carcinoma) of skin (12/20/2019), SCCA (squamous cell carcinoma) of  skin (05/27/2020), Superficial basal cell carcinoma (BCC) (11/21/2012), Superficial basal cell carcinoma (BCC) (11/02/2017), Superficial nodular basal cell carcinoma (BCC) (12/20/2019), and Superficial nodular basal cell carcinoma (BCC) (05/27/2020).    PAST SURGICAL HISTORY: Past Surgical History:  Procedure Laterality Date   ADJACENT TISSUE TRANSFER/TISSUE REARRANGEMENT N/A 05/22/2019   Procedure: COMPLEX CLOSURE OF NECK WOUND;  Surgeon: Allena Napoleon, MD;  Location: [facility] OR;  Service: Plastics;  Laterality: N/A;   ALLOGRAFT APPLICATION N/A 05/22/2019   Procedure: POSSIBLE FACIAL NERVE RECONSTRUCTION WITH NERVE ALLOGRAFT VS AUTOGRAFT;  Surgeon: Allena Napoleon, MD;  Location: [facility] OR;  Service: Plastics;  Laterality: N/A;   BLADDER SURGERY     due to prostatectomy.    CATARACT EXTRACTION     right   COLONOSCOPY     neg in the past; due in 2014 with [place] Dr. Jarold Motto   EYE SURGERY     50 years ago.    HERNIA REPAIR     MOHS SURGERY     NECK DISSECTION  05/22/2019    NECK DISSECTION   PAROTIDECTOMY  05/22/2019   PAROTIDECTOMY with facial nerve dissection    PAROTIDECTOMY Right 05/22/2019   Procedure: PAROTIDECTOMY;  Surgeon: Serena Colonel, MD;  Location: [facility] OR;  Service: ENT;  Laterality: Right;   PROSTATE SURGERY     RADICAL NECK DISSECTION Right 05/22/2019   Procedure: RADICAL NECK DISSECTION;  Surgeon: Serena Colonel, MD;  Location: [facility] OR;  Service: ENT;  Laterality: Right;   TONSILLECTOMY      FAMILY HISTORY: family history includes Lung cancer (age of onset: 1)  in his father; Prostate cancer in his brother and brother; Uterine cancer (age of onset: 36) in his mother.  SOCIAL HISTORY:  reports that he has never smoked. He has never used smokeless tobacco. He reports that he does not drink alcohol and does not use drugs.  ALLERGIES: Methylprednisolone, Penicillins, Latex, and Tape  MEDICATIONS:  Current Outpatient Medications  Medication Sig Dispense Refill   amLODipine  (NORVASC) 5 MG tablet Take 5 mg by mouth daily.      aspirin 81 MG tablet Take 81 mg by mouth daily.     benazepril (LOTENSIN) 10 MG tablet Take 10 mg by mouth daily.     famotidine-calcium carbonate-magnesium hydroxide (PEPCID COMPLETE) 10-800-165 MG chewable tablet Take one tablet PRN     levothyroxine (SYNTHROID) 25 MCG tablet Take 25 mcg by mouth daily before breakfast.     Multiple Vitamin (MULTIVITAMIN) tablet Take 1 tablet by mouth daily.     potassium citrate (UROCIT-K) 10 MEQ (1080 MG) SR tablet Take 10 mEq by mouth 3 (three) times daily with meals.     simvastatin (ZOCOR) 20 MG tablet Take 20 mg by mouth every evening.     No current facility-administered medications for this encounter.    REVIEW OF SYSTEMS:  Notable for that above.   PHYSICAL EXAM:  height is 5\' 9"  (1.753 m) and weight is 180 lb 4 oz (81.8 kg). His temporal temperature is 97.3 F (36.3 C) (abnormal). His blood pressure is 130/70 and his pulse is 62. His respiration is 178 (abnormal) and oxygen saturation is 97%.   General: Alert and oriented, in no acute distress  HEENT: Head is normocephalic. Extraocular movements are intact. Oropharynx is clear. Neck: There is swelling in the right posterior auricular area with tumor growing through the skin.  He is demonstrating dried blood in the right ear canal. Lymphatics: see Neck Exam Skin: No concerning lesions. Musculoskeletal: symmetric strength and muscle tone throughout. Neurologic: Cranial nerves II through XII are grossly intact. No obvious focalities. Speech is fluent. Coordination is intact. Psychiatric: Judgment and insight are intact. Affect is appropriate.   ECOG = 1  0 - Asymptomatic (Fully active, able to carry on all predisease activities without restriction)  1 - Symptomatic but completely ambulatory (Restricted in physically strenuous activity but ambulatory and able to carry out work of a light or sedentary nature. For example, light housework,  office work)  2 - Symptomatic, <50% in bed during the day (Ambulatory and capable of all self care but unable to carry out any work activities. Up and about more than 50% of waking hours)  3 - Symptomatic, >50% in bed, but not bedbound (Capable of only limited self-care, confined to bed or chair 50% or more of waking hours)  4 - Bedbound (Completely disabled. Cannot carry on any self-care. Totally confined to bed or chair)  5 - Death   Santiago Glad MM, Creech RH, Tormey DC, et al. [phone]). "Toxicity and response criteria of the [facility]". Am. Evlyn Clines. Oncol. 5 (6): 649-55   LABORATORY DATA:  Lab Results  Component Value Date   WBC 7.8 10/14/2022   HGB 15.9 10/14/2022   HCT 46.5 10/14/2022   MCV 93.2 10/14/2022   PLT 232 10/14/2022   CMP     Component Value Date/Time   NA 140 10/14/2022 0831   NA 141 11/18/2011 1046   K 4.2 10/14/2022 0831   K 4.3 11/18/2011 1046   CL 104 10/14/2022 0831  CL 109 (H) 11/18/2011 1046   CO2 30 10/14/2022 0831   CO2 25 11/18/2011 1046   GLUCOSE 124 (H) 10/14/2022 0831   GLUCOSE 89 11/18/2011 1046   BUN 18 10/14/2022 0831   BUN 17.0 11/18/2011 1046   CREATININE 1.36 (H) 10/14/2022 0831   CREATININE 1.3 11/18/2011 1046   CALCIUM 9.9 10/14/2022 0831   CALCIUM 10.1 11/18/2011 1046   PROT 7.0 10/14/2022 0831   PROT 6.8 11/18/2011 1046   ALBUMIN 4.3 10/14/2022 0831   ALBUMIN 4.1 11/18/2011 1046   AST 22 10/14/2022 0831   AST 32 11/18/2011 1046   ALT 24 10/14/2022 0831   ALT 48 11/18/2011 1046   ALKPHOS 75 10/14/2022 0831   ALKPHOS 79 11/18/2011 1046   BILITOT 0.6 10/14/2022 0831   BILITOT 0.70 11/18/2011 1046   GFRNONAA 50 (L) 10/14/2022 0831         RADIOGRAPHY: NM PET Image Initial (PI) Whole Body  Result Date: 10/08/2022 CLINICAL DATA:  Initial treatment strategy for bone lesion, status post biopsy demonstrating squamous cell carcinoma metastasis. History of prostate cancer. EXAM: NUCLEAR MEDICINE PET WHOLE BODY  TECHNIQUE: 9.1 mCi F-18 FDG was injected intravenously. Full-ring PET imaging was performed from the head to foot after the radiotracer. CT data was obtained and used for attenuation correction and anatomic localization. Fasting blood glucose: 111. Mg/dl COMPARISON:  Chest abdomen and pelvic CTs of 10/09/2020. Pelvic MRI of 08/13/2022. neck CT 02/02/2020 also reviewed. FINDINGS: Mediastinal blood pool activity: SUV max 2.5 HEAD/NECK: A dominant mass centered within and caudal to the inferomedial right mastoid air cells measures on the order of 4.4 x 4.6 cm and a S.U.V. max of 36.2 on 45/4. No cervical nodal hypermetabolism. Right-sided thyroid nodule measures 2.3 x 2.6 cm and a S.U.V. max of 3.3 on 77/4. Relatively similar in size and morphology back on 02/02/2020. Incidental CT findings: Bilateral carotid atherosclerosis. No cervical adenopathy. Right mastoid effusion. CHEST: No pulmonary parenchymal hypermetabolism. Bilateral hypermetabolic hilar and mediastinal nodes, with faint calcification within. Index node within the subcarinal station with extension into the azygoesophageal recess measures 1.4 cm and a S.U.V. max of 10.7 on 104/4. Similar in size on 10/09/2020 Mild distal esophageal hypermetabolism in the setting of a tiny hiatal hernia, likely related to esophagitis. Incidental CT findings: Aortic and coronary artery calcification. Mild ascending aortic dilatation at 4.3 cm, similar. ABDOMEN/PELVIS: No abdominopelvic nodal hypermetabolism. Hypermetabolism within the cecal/ascending colonic junction corresponding to subtle soft tissue fullness. Example at a S.U.V. max of 23.1 on 178/4. Presumably urinary contamination about the penis and perineum. Incidental CT findings: Normal adrenal glands. Left renal low-density lesions are likely cysts at up to 6.7 cm . In the absence of clinically indicated signs/symptoms require(s) no independent follow-up. 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 Scattered  colonic diverticula. Left inguinal hernia contains nonobstructive sigmoid colon. Prostatectomy. SKELETON: Sclerotic inferior sacral/coccygeal hypermetabolic lesion including at a S.U.V. max of 11.2 on 199/4. A sclerotic L1 lesion measures a S.U.V. max of 8.6. Incidental CT findings: none EXTREMITIES: No areas of abnormal hypermetabolism. Incidental CT findings: none IMPRESSION: 1. Hypermetabolic osseous metastasis. 2. Dominant mass centered about the inferior medial portion of the right mastoid, suspicious for site of primary squamous cell carcinoma. 3. Hypermetabolic thoracic adenopathy. Although this could represent nodal metastasis, relative size stability since 10/09/2020 and faint nodal calcification suggest alternative etiologies such as sarcoidosis. 4. Ascending colonic hypermetabolism and soft tissue fullness are highly suspicious for colonic polyp or carcinoma. 5. Right thyroid  nodule is hypermetabolic but stable in size since a CT of 02/02/2020. Given patient age and comorbidities, of questionable clinical significance. If further evaluation is desired, thyroid ultrasound and possible biopsy could be performed. 6. Incidental findings, including: Tiny hiatal hernia. Similar ascending aortic dilatation. Coronary artery atherosclerosis. Aortic Atherosclerosis (ICD10-I70.0). Right renal artery aneurysm. Left inguinal hernia containing nonobstructive sigmoid. Electronically Signed   By: Jeronimo Greaves [name]   On: 10/08/2022 11:17      IMPRESSION/PLAN: This is a delightful 89 year old man who had a history of locally advanced basal cell carcinoma of the right neck, having received postoperative radiation therapy as described above, unfortunately demonstrating recurrent bulky disease in that area as well as evidence of bony metastatic disease in his lower spine.  Paradoxically biopsy of the sacral disease demonstrates squamous cell carcinoma.  He does have a history of smaller squamous cell carcinomas of the skin  in the past.  It is also possible that his skin cancer mutated into his squamous differentiation upon metastasis.  Also interestingly, his Mohs surgery in the right neck did show squamous cell carcinoma before neck dissection had ultimately demonstrated basal cell carcinoma on his final pathology.    In any case, I discussed his complex situation with Dr. Leonides Schanz today who thinks the patient is a good candidate for cemiplimab immunotherapy for both the squamous and basal cell carcinomas.  After talking to Dr. Leonides Schanz and the patient and his family we are all in agreement that it makes sense to start this treatment regimen first and he will be monitored closely by Dr. Leonides Schanz.  We will hold radiation with palliative intent if he does not have a satisfactory response to systemic therapy.  He is not having severe pain at this time and I do not think there is any urgent reason to start radiation now.  Patient / family are pleased with this plan and will see me back as needed.  On date of service, in total, I spent 55 minutes on this encounter. Patient was seen in person.   __________________________________________   Lonie Peak, MD  This document serves as a record of services personally performed by Lonie Peak, MD. It was created on her behalf by Neena Rhymes, a trained medical scribe. The creation of this record is based on the scribe's personal observations and the provider's statements to them. This document has been checked and approved by the attending provider.

## 2022-10-29 NOTE — Telephone Encounter (Signed)
 Rn called pt for meaningful use and nurse evaluation information. Consult note completed and routed to Dr. Basilio Cairo for review.

## 2022-10-29 NOTE — Progress Notes (Addendum)
Histology and Location of Primary Cancer:    Location(s) of Symptomatic tumor(s): 09-14-22 FINAL MICROSCOPIC DIAGNOSIS:  A. 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  Focally the tumor appears to show dyskeratosis and in other areas a suggestion of glandular lumens. Nine immunohistochemical stains performed with adequate control.  The tumor is diffusely and strongly positive for the squamous marker p40. The tumor shows patchy positivity for the pulmonary adeno marker TTF-1; however, it is negative for the pulmonary adeno marker Napsin A. Additionally the tumor is negative for cytokeratin 7 making the TTF-1 positivity suspect and possibly due to specimen decalcification.  The tumor is negative for the prostate markers prostate-specific antigen and prostein.  Tumor shows weak focal nonspecific positivity for GATA3.  The tumor is negative for the GI markers cytokeratin 20 and CDX2.  The histomorphology and immunohistochemical profile favor a squamous carcinoma.   MR Pelvis W Wo Contrast 08-13-22 IMPRESSION: 1. Enlarging sacral bone lesion extending from S2 through S5 with slightly more inferior extension into the S5 segment measuring 6 cm in craniocaudal dimension compared with 4.8 cm previously. Persistent stable bone lesion in the right superior ilium. Stable 14 mm bone lesion in the left posterior ilium. Overall appearance is most consistent with metastatic disease. 2. Mild osteoarthritis of bilateral hips.  Ct Biopsy On: 09/14/2022 14:24 IMPRESSION: Successful CT guided LEFT sacral bone lesion core biopsy   NM PET Image  Initial (PI) Whole Body 10/02/2022  IMPRESSION: 1. Hypermetabolic osseous metastasis. 2. Dominant mass centered about the inferior medial portion of the right mastoid, suspicious for site of primary squamous cell carcinoma. 3. Hypermetabolic thoracic adenopathy. Although this could represent nodal metastasis, relative size stability since 10/09/2020 and faint nodal calcification suggest alternative etiologies such as sarcoidosis. 4. Ascending colonic hypermetabolism and soft tissue fullness are highly suspicious for colonic polyp or carcinoma. 5. Right thyroid nodule is hypermetabolic but stable in size since a CT of 02/02/2020. Given patient age and comorbidities, of questionable clinical significance. If further evaluation is desired, thyroid ultrasound and possible biopsy could be performed. 6. Incidental findings, including: Tiny hiatal hernia. Similar ascending aortic dilatation. Coronary artery atherosclerosis. Aortic Atherosclerosis (ICD10-I70.0). Right renal artery aneurysm. Left inguinal hernia containing nonobstructive sigmoid.  Past/Anticipated chemotherapy by medical oncology, if any:  Jaci Standard, MD 08/25/2022  Hematological/Oncological History # Bone Lesions on CT/MRI 08/30/2020: CT Pelvis W contrast showed 1.2 cm lucent lesion over the superior left iliac bone adjacent the sacroiliac joint corresponding to signal abnormality seen on MRI 09/19/2020: MRI pelvis WWO showed Marrow replacing lesions within the left iliac bone and sacrum. Additionally there was noted to be a new enhancing lesion at the tip of the right greater trochanter measuring up to 1.7 cm also suspicious for metastatic disease. 09/27/2020: establish care with Dr. Leonides Schanz 10/29/2020: CT biopsy performed of left iliac bone lesion. Pathology showed bone and marrow without evidence of carcinoma.   Interval History:  ITZHAK BARTNIK 89 y.o. male with medical history significant for bone lesions noted on CT/MRI  who presents for a follow up visit. The patient's last visit was on 08/06/2021. In the interim since the last visit he completed an MRI pelvis on 08/13/2022 which showed increased size of  the sacral mass, measuring 6 cm up from 4.8 cm 6 months ago.   On exam today Mr. Kondor is unaccompanied.  He reports he has been well overall in the interim since her last visit.  He notes that he has not had any pain in his back, hips, or elsewhere in his body.  He does have a little bit of occasional numbness on the back of his head and that does concern him.  He reports that he has had stable weight and his appetite is good.  His energy levels are strong and is able to do all of his day-to-day activities.  Overall he is at his baseline level of health and has no questions concerns or complaints today.  He denies any fevers, chills, sweats, nausea, vomiting or diarrhea.  His weight is been stable.  A full 10 point ROS is listed below.  ASSESSMENT & PLAN REDRICK NORDAHL 89 y.o. male with medical history significant for bone lesions noted on CT/MRI who presents for a follow up visit.   The patient underwent a biopsy which showed no evidence of malignancy.  Only showed normal bone tissue and normal bone marrow.  Discussed these findings with IR who recommended repeat imaging in 3 months time as they are certain there biopsy site was in the right location and did not think another biopsy would be necessary.  I discussed these findings with the patient he was in agreement that we could repeat imaging in 6 months time in order to reassess.   # Bone Lesions -- based on CT pelvis and MRI pelvis findings are concerning for a stable lesion of unclear etiology.  --initial biopsy shows no evidence of malignancy. Previously discussed with radiology who notes a repeat biopsy was not recommended and repeat imaging would be preferred.  --negative multiple myeloma labs with SPEP and serum free light chains showing no evidence of  monoclonal gammopathy.  --Given his prior history of prostate cancer we ordered a PSA, which was normal. Repeated PSA previously, found to be within normal limits --CT C/A/P shows no evidence of disease elsewhere in the body in August 2022 --Labs today show white blood cell count 6.6, hemoglobin 15.6, MCV 93.6, and platelets of 250 --will reach out to IR again to see if biopsy could be considered due to continued growth. If biopsy is no recommended we can pursue continued serial imaging.  --Return to clinic with imaging studies in 6 months.  Patient's main complaints related to symptomatic tumor(s) are: was found on imaging  Pain on a scale of 0-10 is: Pt does have intermittent mild headaches. He also reports that he does bite his tongue intermittently, he has been doing this for about 6 months. This biting his tongue is getting worse now.   If Spine Met(s), symptoms, if any, include: Bowel/Bladder retention or incontinence (please describe): na Numbness or weakness in extremities (please describe): na Current Decadron regimen, if applicable: na  Ambulatory status? Walker? Wheelchair?: ambulates without assistance   SAFETY ISSUES: Prior radiation? Prostate cancer radiation in the 90's. 2021 had radiation with Dr. Basilio Cairo to right neck Pacemaker/ICD? None  Possible current pregnancy? no Is the patient on methotrexate? no  Additional Complaints / other details:  Pt wants to know the plan and what radiation can be offered.

## 2022-10-30 ENCOUNTER — Ambulatory Visit
Admission: RE | Admit: 2022-10-30 | Discharge: 2022-10-30 | Disposition: A | Discharge status: Home / Self Care | Payer: Medicare Other | Source: Ambulatory Visit | Attending: Radiation Oncology | Admitting: Radiation Oncology

## 2022-10-30 VITALS — BP 130/70 | HR 62 | Temp 97.3°F | Resp 178 | Ht 69.0 in | Wt 180.2 lb

## 2022-10-30 DIAGNOSIS — Z923 Personal history of irradiation: Secondary | ICD-10-CM | POA: Insufficient documentation

## 2022-10-30 DIAGNOSIS — C7951 Secondary malignant neoplasm of bone: Secondary | ICD-10-CM | POA: Insufficient documentation

## 2022-10-30 DIAGNOSIS — Z85828 Personal history of other malignant neoplasm of skin: Secondary | ICD-10-CM | POA: Diagnosis not present

## 2022-10-30 DIAGNOSIS — C801 Malignant (primary) neoplasm, unspecified: Secondary | ICD-10-CM | POA: Insufficient documentation

## 2022-10-30 DIAGNOSIS — C4441 Basal cell carcinoma of skin of scalp and neck: Secondary | ICD-10-CM

## 2022-11-02 ENCOUNTER — Ambulatory Visit: Payer: Medicare Other | Admitting: Radiation Oncology

## 2022-11-02 ENCOUNTER — Inpatient Hospital Stay: Payer: Medicare Other | Attending: Hematology and Oncology | Admitting: Hematology and Oncology

## 2022-11-02 ENCOUNTER — Inpatient Hospital Stay: Payer: Medicare Other

## 2022-11-02 VITALS — BP 123/64 | HR 60 | Temp 97.5°F | Resp 13 | Wt 180.2 lb

## 2022-11-02 DIAGNOSIS — Z79620 Long term (current) use of immunosuppressive biologic: Secondary | ICD-10-CM | POA: Diagnosis not present

## 2022-11-02 DIAGNOSIS — M899 Disorder of bone, unspecified: Secondary | ICD-10-CM

## 2022-11-02 DIAGNOSIS — Z5112 Encounter for antineoplastic immunotherapy: Secondary | ICD-10-CM | POA: Insufficient documentation

## 2022-11-02 DIAGNOSIS — C4441 Basal cell carcinoma of skin of scalp and neck: Secondary | ICD-10-CM | POA: Diagnosis not present

## 2022-11-02 DIAGNOSIS — C4492 Squamous cell carcinoma of skin, unspecified: Secondary | ICD-10-CM

## 2022-11-02 DIAGNOSIS — C7951 Secondary malignant neoplasm of bone: Secondary | ICD-10-CM | POA: Diagnosis not present

## 2022-11-02 NOTE — Progress Notes (Unsigned)
[facility] Telephone:([phone]) [phone]   Fax:([phone]  PROGRESS NOTE  Patient Care Team: Garlan Fillers, MD as PCP - General (Internal Medicine) Garlan Fillers, MD as Consulting Physician (Internal Medicine) Exie Parody, MD (Hematology and Oncology) Bjorn Pippin, MD as Attending Physician (Urology) Mardella Layman, MD as Attending Physician (Gastroenterology) Janalyn Harder, MD (Inactive) as Consulting Physician (Dermatology) Lonie Peak, MD as Consulting Physician (Radiation Oncology) Malmfelt, Lise Auer, RN as Oncology Nurse Navigator (Oncology)  Hematological/Oncological History # Bone Lesions on CT/MRI # 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 Additionally there was noted to be a new enhancing lesion at the tip of the right greater trochanter measuring up to 1.7 cm also suspicious for metastatic disease. 09/27/2020: establish care with Dr. Leonides Schanz 10/29/2020: CT biopsy performed of left iliac bone lesion. Pathology showed bone and marrow without evidence of carcinoma.   Interval History:  Martin Curry 89 y.o. male with medical history significant for bone lesions noted on CT/MRI who presents for a follow up visit. The patient's last visit was on 10/14/2022. In the interim since the last visit he had a visit with radiation oncology and we have had the opportunity to cross compare his pathology results from his skin sample.   On exam today Martin Curry is accompanied by his wife and daughter.  He reports he has been well overall in the interim since her last visit.  Overall he is at his baseline level of health and has no questions concerns or  complaints today.  He denies any fevers, chills, sweats, nausea, vomiting or diarrhea.  His weight is been stable.  A full 10 point ROS is listed below.  The bulk of our discussion focused on the findings from his pathology review, his discussion with radiation oncology, and options moving forward.  We discussed treatment options and details of this conversation are noted below.  At this time he was agreeable to proceeding with Cemiplimab immunotherapy every 3 weeks until progression or intolerance.  He voices understanding of our findings and was willing and able to proceed with treatment.   MEDICAL HISTORY:  Past Medical History:  Diagnosis Date   Atypical nevus 09/07/2002   left abdomen-slight   BCC (basal cell carcinoma of skin) 07/19/2014   left sideburn- +margin-exc.   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   Nodular basal cell carcinoma (BCC) 07/16/2015   left scalp-CX35FU   Polycythemia    Prostate cancer (HCC) 1990's   total prostatectomy and adjuvant radiation. Last PSA was 0.02 in  09/2011   SCC (squamous cell carcinoma) 04/03/2009   forehead-txpbx   SCC (squamous cell carcinoma) 07/16/2015   left front scalp   SCC (squamous cell carcinoma) 07/16/2015   left front scalp-Cx35FU   SCCA (squamous cell carcinoma) of skin 12/20/2019   Right forehead (in situ)   SCCA (squamous cell carcinoma) of skin  05/27/2020   Mid Parietal Scalp (in situ)   Superficial basal cell carcinoma (BCC) 11/21/2012   behind left ear-CX35FU, midback-CX35FU, right forehead-CX35FU-exc   Superficial basal cell carcinoma (BCC) 11/02/2017   left post neck-CX35FU   Superficial nodular basal cell carcinoma (BCC) 12/20/2019   Left temporal scalp   Superficial nodular basal cell carcinoma (BCC) 05/27/2020   Right Preauricular area (curet and 5FU)    SURGICAL HISTORY: Past Surgical History:  Procedure Laterality Date   ADJACENT TISSUE TRANSFER/TISSUE REARRANGEMENT N/A 05/22/2019   Procedure: COMPLEX CLOSURE OF NECK WOUND;  Surgeon: Allena Napoleon, MD;  Location: [facility] OR;  Service: Plastics;  Laterality: N/A;   ALLOGRAFT APPLICATION N/A 05/22/2019   Procedure: POSSIBLE FACIAL NERVE RECONSTRUCTION WITH NERVE ALLOGRAFT VS AUTOGRAFT;  Surgeon: Allena Napoleon, MD;  Location: [facility] OR;  Service: Plastics;  Laterality: N/A;   BLADDER SURGERY     due to prostatectomy.    CATARACT EXTRACTION     right   COLONOSCOPY     neg in the past; due in 2014 with [place] Dr. Jarold Motto   EYE SURGERY     50 years ago.    HERNIA REPAIR     MOHS SURGERY     NECK DISSECTION  05/22/2019    NECK DISSECTION   PAROTIDECTOMY  05/22/2019   PAROTIDECTOMY with facial nerve dissection    PAROTIDECTOMY Right 05/22/2019   Procedure: PAROTIDECTOMY;  Surgeon: Serena Colonel, MD;  Location: [facility] OR;  Service: ENT;  Laterality: Right;   PROSTATE SURGERY     RADICAL NECK DISSECTION Right 05/22/2019   Procedure: RADICAL NECK DISSECTION;  Surgeon: Serena Colonel, MD;  Location: [facility] OR;  Service: ENT;  Laterality: Right;   TONSILLECTOMY      SOCIAL HISTORY: Social History   Socioeconomic History   Marital status: Married    Spouse name: Not on file   Number of children: 3   Years of education: Not on file   Highest education level: Not on file  Occupational History    Comment: retired Scientist, [employer]; 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   Occupation: retired  Tobacco  Use   Smoking status: Never   Smokeless tobacco: Never  Vaping Use   Vaping status: Never Used  Substance and Sexual Activity   Alcohol use: No   Drug use: No   Sexual activity: Not on file  Other Topics Concern   Not on file  Social History Narrative   Not on file   Social Determinants of Health   Financial Resource Strain: Not on file  Food Insecurity: No Food Insecurity (10/29/2022)   Hunger Vital Sign    Worried About Running Out of Food in the Last Year: Never true    Ran Out of Food in the Last Year: Never true  Transportation Needs: No Transportation Needs (10/29/2022)   PRAPARE - Administrator, Civil Service (Medical): No    Lack of Transportation (Non-Medical): No  Physical Activity: Not on file  Stress: Not on file  Social Connections: Not on file  Intimate Partner Violence: Not At Risk (10/29/2022)   Humiliation, Afraid, Rape, and Kick questionnaire    Fear of  Current or Ex-Partner: No    Emotionally Abused: No    Physically Abused: No    Sexually Abused: No    FAMILY HISTORY: Family History  Problem Relation Age of Onset   Uterine cancer Mother 39   Ovarian cancer Mother    Lung cancer Father 34   Prostate cancer Brother    Prostate cancer Brother    Colon cancer Neg Hx    Stomach cancer Neg Hx    Esophageal cancer Neg Hx     ALLERGIES:  is allergic to methylprednisolone, penicillins, latex, and tape.  MEDICATIONS:  Current Outpatient Medications  Medication Sig Dispense Refill   amLODipine (NORVASC) 5 MG tablet Take 5 mg by mouth daily.      aspirin 81 MG tablet Take 81 mg by mouth daily.     benazepril (LOTENSIN) 10 MG tablet Take 10 mg by mouth daily.     famotidine-calcium carbonate-magnesium hydroxide (PEPCID COMPLETE) 10-800-165 MG chewable tablet Take one tablet PRN     levothyroxine (SYNTHROID) 25 MCG tablet Take 25 mcg by mouth daily before breakfast.     Multiple Vitamin (MULTIVITAMIN) tablet Take 1 tablet by mouth daily.      potassium citrate (UROCIT-K) 10 MEQ (1080 MG) SR tablet Take 10 mEq by mouth 3 (three) times daily with meals.     simvastatin (ZOCOR) 20 MG tablet Take 20 mg by mouth every evening.     No current facility-administered medications for this visit.    REVIEW OF SYSTEMS:   Constitutional: ( - ) fevers, ( - )  chills , ( - ) night sweats Eyes: ( - ) blurriness of vision, ( - ) double vision, ( - ) watery eyes Ears, nose, mouth, throat, and face: ( - ) mucositis, ( - ) sore throat Respiratory: ( - ) cough, ( - ) dyspnea, ( - ) wheezes Cardiovascular: ( - ) palpitation, ( - ) chest discomfort, ( - ) lower extremity swelling Gastrointestinal:  ( - ) nausea, ( - ) heartburn, ( - ) change in bowel habits Skin: ( - ) abnormal skin rashes Lymphatics: ( - ) new lymphadenopathy, ( - ) easy bruising Neurological: ( - ) numbness, ( - ) tingling, ( - ) new weaknesses Behavioral/Psych: ( - ) mood change, ( - ) new changes  All other systems were reviewed with the patient and are negative.  PHYSICAL EXAMINATION: ECOG PERFORMANCE STATUS: 1 - Symptomatic but completely ambulatory  Vitals:   11/02/22 1027  BP: 123/64  Pulse: 60  Resp: 13  Temp: (!) 97.5 F (36.4 C)  SpO2: 98%    Filed Weights   11/02/22 1027  Weight: 180 lb 3.2 oz (81.7 kg)     GENERAL: Well-appearing elderly Caucasian male, alert, no distress and comfortable SKIN: skin color, texture, turgor are normal, no rashes or significant lesions EYES: conjunctiva are pink and non-injected, sclera clear LUNGS: clear to auscultation and percussion with normal breathing effort HEART: regular rate & rhythm and no murmurs and no lower extremity edema Musculoskeletal: no cyanosis of digits and no clubbing  PSYCH: alert & oriented x 3, fluent speech NEURO: no focal motor/sensory deficits  LABORATORY DATA:  I have reviewed the data as listed    Latest Ref Rng & Units 10/14/2022    8:31 AM 09/14/2022    7:30 AM 08/25/2022   11:27 AM  CBC   WBC 4.0 - 10.5 K/uL 7.8  7.4  6.6   Hemoglobin 13.0 - 17.0 g/dL  15.9  16.0  15.6   Hematocrit 39.0 - 52.0 % 46.5  46.8  45.7   Platelets 150 - 400 K/uL 232  220  250        Latest Ref Rng & Units 10/14/2022    8:31 AM 09/14/2022    7:30 AM 08/25/2022   11:27 AM  CMP  Glucose 70 - 99 mg/dL 409  811  91   BUN 8 - 23 mg/dL 18  22  15    Creatinine 0.61 - 1.24 mg/dL 9.14  7.82  9.56   Sodium 135 - 145 mmol/L 140  137  141   Potassium 3.5 - 5.1 mmol/L 4.2  4.1  4.9   Chloride 98 - 111 mmol/L 104  108  107   CO2 22 - 32 mmol/L 30  22  30    Calcium 8.9 - 10.3 mg/dL 9.9  9.2  21.3   Total Protein 6.5 - 8.1 g/dL 7.0   6.4   Total Bilirubin 0.3 - 1.2 mg/dL 0.6   0.7   Alkaline Phos 38 - 126 U/L 75   76   AST 15 - 41 U/L 22   23   ALT 0 - 44 U/L 24   24     Lab Results  Component Value Date   MPROTEIN Not Observed 09/27/2020   Lab Results  Component Value Date   KPAFRELGTCHN 25.6 (H) 09/27/2020   LAMBDASER 23.0 09/27/2020   KAPLAMBRATIO 1.11 09/27/2020    RADIOGRAPHIC STUDIES: No results found.  ASSESSMENT & PLAN KHALEEQ LUBIN 89 y.o. 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  At this time the patient's clinical findings are complicated.  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  It is unclear if these 2 processes are the same or 2 separate primaries.  Additionally there is no clear evidence of a primary squamous cell carcinoma (however my concern is that the area behind the right ear may represent the same process).  Patient also does have PET avid areas of his bowels which I do not suspect are related, but we will have evaluated further with EGD/colonoscopy as needed.  At this time the best treatment course would be to treat with Cemiplimab which should be  effective at treating both basal cell carcinoma and squamous cell carcinoma, effectively treating both even if they are separate processes.  Overall he voices understanding of this complicated situation and our plan moving forward.  Of note radiation oncology and ENT are on board and willing to assist with management of his current findings.  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 Previously discussed with radiology who notes a repeat biopsy was not recommended and repeat imaging would be preferred.  --negative multiple myeloma labs with SPEP and serum free light chains showing no evidence of monoclonal gammopathy.  --Given his prior history of prostate cancer we ordered a PSA, which was normal. Repeated PSA previously, found to be within normal limits --CT C/A/P shows no evidence of disease elsewhere in the body in August 2022. Most recent PET CT scan on 10/02/2022 showed hypermetabolic osseous metastasis with a dominant mass centered about the inferior medial portion of the right mastoid,  suspicious for site of primary squamous cell carcinoma. -- PET CT scan shows evidence of lesion behind the right ear as well as the known pelvic metastasis. -- Reviewed tissue with pathology to determine if both lesions are the same pathology or different origins.  At this time findings are most consistent with a basal cell primary behind his ear and squamous cell carcinoma of the pelvis.  It is possible these at the same pathology with the basal/squamous crossover, though they may represent separate primaries.  Either way Cemiplimab therapy should be adequate to treat both. -- PD-L1 testing showed 0% PD-L1 score, however the use of cemiplimab does not require PD-L1 positivity (lack of PD-L1 prevents the use of pembrolizumab)  -- Return to clinic for the start of Cemiplimab.   # Bleeding of the Ear -- Thought  to be secondary due to erosion of the tumor into his ear canal. -- Bleeding has been steady with no recent worsening.  Has been persistent for months. -- Will likely be treated by immunotherapy, however if it worsens could consider palliative radiation to the area in order to stanch the bleeding.  Orders Placed This Encounter  Procedures   CBC with Differential (Cancer Center Only)    Standing Status:   Future    Standing Expiration Date:   11/09/2023   CMP (Cancer Center only)    Standing Status:   Future    Standing Expiration Date:   11/09/2023   T4    Standing Status:   Future    Standing Expiration Date:   11/09/2023   TSH    Standing Status:   Future    Standing Expiration Date:   11/09/2023    All questions were answered. The patient knows to call the clinic with any problems, questions or concerns.  A total of more than 30 minutes were spent on this encounter with face-to-face time and non-face-to-face time, including preparing to see the patient, ordering tests and/or medications, counseling the patient and coordination of care as outlined above.   Ulysees Barns, MD Department of Hematology/Oncology [facility] Phone: [phone] Pager: [phone] Email: [email or web address]  11/03/2022 4:56 PM

## 2022-11-03 ENCOUNTER — Ambulatory Visit: Payer: Medicare Other | Admitting: Internal Medicine

## 2022-11-03 ENCOUNTER — Encounter: Payer: Self-pay | Admitting: Hematology and Oncology

## 2022-11-03 ENCOUNTER — Encounter: Payer: Self-pay | Admitting: Internal Medicine

## 2022-11-03 VITALS — BP 130/70 | HR 67 | Ht 69.0 in | Wt 180.0 lb

## 2022-11-03 DIAGNOSIS — K5730 Diverticulosis of large intestine without perforation or abscess without bleeding: Secondary | ICD-10-CM | POA: Diagnosis not present

## 2022-11-03 DIAGNOSIS — C4492 Squamous cell carcinoma of skin, unspecified: Secondary | ICD-10-CM | POA: Insufficient documentation

## 2022-11-03 DIAGNOSIS — K219 Gastro-esophageal reflux disease without esophagitis: Secondary | ICD-10-CM | POA: Diagnosis not present

## 2022-11-03 DIAGNOSIS — R948 Abnormal results of function studies of other organs and systems: Secondary | ICD-10-CM

## 2022-11-03 NOTE — Patient Instructions (Addendum)
Please let us know if you change your mind about a colonoscopy procedure in the future pending your upcoming cancer treatment.  If your blood pressure at your visit was 140/90 or greater, please contact your primary care physician to follow up on this.  _______________________________________________________  If you are age 87 or older, your body mass index should be between 23-30. Your Body mass index is 26.58 kg/m. If this is out of the aforementioned range listed, please consider follow up with your Primary Care Provider.  If you are age 75 or younger, your body mass index should be between 19-25. Your Body mass index is 26.58 kg/m. If this is out of the aformentioned range listed, please consider follow up with your Primary Care Provider.   ________________________________________________________  The [facility] providers would like to encourage you to use [facility] to communicate with providers for non-urgent requests or questions.  Due to long hold times on the telephone, sending your provider a message by [facility] may be a faster and more efficient way to get a response.  Please allow 48 business hours for a response.  Please remember that this is for non-urgent requests.  _______________________________________________________   Thank you for entrusting me with your care and for choosing [facility],  Dr. Eulah Pont

## 2022-11-03 NOTE — Progress Notes (Signed)
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  PET CT scan on 8/9 showed osseous metastasis, dominant mass centered in the inferior medial portion of the right mastoid suspicious for primary squamous cell cancer, hypermetabolic thoracic adenopathy, ascending colon hypermetabolism that is suspicious for colon polyp or carcinoma, mild distal esophageal hypermetabolism in the setting of tiny hiatal hernia likely related to esophagitis, hypermetabolic right thyroid nodule, and tiny hiatal hernia.  Patient was referred to [place] to discuss whether or not an EGD and/or colonoscopy would be indicated for further evaluation.  He just saw oncology yesterday who is getting him set up for treatment, hopefully will be able to start this next week.  His last colonoscopy in 2004 showed diverticulosis in the left side of the colon.  He has never had any colon polyps on his colonoscopies in the past.  Denies melena, hematochezia, constipation, abdominal pain, or weight loss recently.  He does have some occasional diarrhea with 3 episodes over the last 3 months.  On average he has 2 bowel movements per day.  Endorses occasional acid reflux if he eats late for which he will take Pepcid as needed.  Pepcid seems to work well for keeping his reflux under good control.  Denies nausea, vomiting, dysphagia.  Denies family history of GI malignancy.  Denies prior EGD.  He is still actively having bleeding from his right ear currently.  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   Past Medical History:  Diagnosis Date   Atypical nevus 09/07/2002   left  abdomen-slight   BCC (basal cell carcinoma of skin) 07/19/2014   left sideburn- +margin-exc.   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   Nodular basal cell carcinoma (BCC) 07/16/2015   left scalp-CX35FU   Polycythemia    Prostate cancer (HCC) 1990's   total prostatectomy and adjuvant radiation. Last PSA was 0.02 in 09/2011   SCC (squamous cell carcinoma) 04/03/2009   forehead-txpbx   SCC (squamous cell carcinoma) 07/16/2015   left front scalp   SCC (squamous cell carcinoma) 07/16/2015   left front scalp-Cx35FU   SCCA (squamous cell carcinoma) of skin 12/20/2019   Right forehead (in situ)   SCCA (squamous cell carcinoma) of skin 05/27/2020   Mid Parietal Scalp (in situ)   Superficial basal cell carcinoma (BCC) 11/21/2012   behind left ear-CX35FU, midback-CX35FU, right forehead-CX35FU-exc   Superficial basal cell carcinoma (BCC) 11/02/2017   left post neck-CX35FU   Superficial nodular basal cell carcinoma (BCC) 12/20/2019  Left temporal scalp   Superficial nodular basal cell carcinoma (BCC) 05/27/2020   Right Preauricular area (curet and 5FU)     Past Surgical History:  Procedure Laterality Date   ADJACENT TISSUE TRANSFER/TISSUE REARRANGEMENT N/A 05/22/2019   Procedure: COMPLEX CLOSURE OF NECK WOUND;  Surgeon: Allena Napoleon, MD;  Location: [facility] OR;  Service: Plastics;  Laterality: N/A;   ALLOGRAFT  APPLICATION N/A 05/22/2019   Procedure: POSSIBLE FACIAL NERVE RECONSTRUCTION WITH NERVE ALLOGRAFT VS AUTOGRAFT;  Surgeon: Allena Napoleon, MD;  Location: [facility] OR;  Service: Plastics;  Laterality: N/A;   BLADDER SURGERY     due to prostatectomy.    CATARACT EXTRACTION     right   COLONOSCOPY     neg in the past; due in 2014 with [place] Dr. Jarold Motto   EYE SURGERY     50 years ago.    HERNIA REPAIR     MOHS SURGERY     NECK DISSECTION  05/22/2019    NECK DISSECTION   PAROTIDECTOMY  05/22/2019   PAROTIDECTOMY with facial nerve dissection    PAROTIDECTOMY Right 05/22/2019   Procedure: PAROTIDECTOMY;  Surgeon: Serena Colonel, MD;  Location: [facility] OR;  Service: ENT;  Laterality: Right;   PROSTATE SURGERY     RADICAL NECK DISSECTION Right 05/22/2019   Procedure: RADICAL NECK DISSECTION;  Surgeon: Serena Colonel, MD;  Location: [facility] OR;  Service: ENT;  Laterality: Right;   TONSILLECTOMY     Family History  Problem Relation Age of Onset   Uterine cancer Mother 79   Ovarian cancer Mother    Lung cancer Father 41   Prostate cancer Brother    Prostate cancer Brother    Colon cancer Neg Hx    Stomach cancer Neg Hx    Esophageal cancer Neg Hx    Social History   Tobacco Use   Smoking status: Never   Smokeless tobacco: Never  Vaping Use   Vaping status: Never Used  Substance Use Topics   Alcohol use: No   Drug use: No   Current Outpatient Medications  Medication Sig Dispense Refill   amLODipine (NORVASC) 5 MG tablet Take 5 mg by mouth daily.      aspirin 81 MG tablet Take 81 mg by mouth daily.     benazepril (LOTENSIN) 10 MG tablet Take 10 mg by mouth daily.     famotidine-calcium carbonate-magnesium hydroxide (PEPCID COMPLETE) 10-800-165 MG chewable tablet Take one tablet PRN     levothyroxine (SYNTHROID) 25 MCG tablet Take 25 mcg by mouth daily before breakfast.     Multiple Vitamin (MULTIVITAMIN) tablet Take 1 tablet by mouth daily.     potassium citrate (UROCIT-K) 10 MEQ (1080 MG) SR  tablet Take 10 mEq by mouth 3 (three) times daily with meals.     simvastatin (ZOCOR) 20 MG tablet Take 20 mg by mouth every evening.     No current facility-administered medications for this visit.   Allergies  Allergen Reactions   Methylprednisolone     Severe hiccups   Penicillins     Did it involve swelling of the face/tongue/throat, SOB, or low BP? Unknown Did it involve sudden or severe rash/hives, skin peeling, or any reaction on the inside of your mouth or nose? Unknown Did you need to seek medical attention at a hospital or doctor's office? Unknown When did it last happen?      Childhood allergy If all above answers are "NO", may proceed with cephalosporin use.    Latex Rash  Tape Rash    Skin gets red and a rash develops with "plastic tape" Skin gets red and a rash develops with "plastic tape"     Review of Systems: All systems reviewed and negative except where noted in HPI.   Physical Exam: BP 130/70   Pulse 67   Ht 5\' 9"  (1.753 m)   Wt 180 lb (81.6 kg)   BMI 26.58 kg/m  Constitutional: Pleasant,well-developed, male in no acute distress. HEENT: Bandage over the right portion of his head Cardiovascular: Normal rate, regular rhythm.  Pulmonary/chest: Effort normal and breath sounds normal. No wheezing, rales or rhonchi. Abdominal: Soft, nondistended, nontender. Bowel sounds active throughout. There are no masses palpable. No hepatomegaly. Extremities: No edema Neurological: Alert and oriented to person place and time. Skin: Skin is warm and dry. No rashes noted. Psychiatric: Normal mood and affect. Behavior is normal.  Labs 08/2022: CBC normal.  INR nml.   Labs 09/2022: CBC unremarkable.  CMP with elevated creatinine of 1.36.  TSH normal.  Free T4 normal.  PET/CT 10/02/22: IMPRESSION: 1. Hypermetabolic osseous metastasis. 2. Dominant mass centered about the inferior medial portion of the right mastoid, suspicious for site of primary squamous cell  carcinoma. 3. Hypermetabolic thoracic adenopathy. Although this could represent nodal metastasis, relative size stability since 10/09/2020 and faint nodal calcification suggest alternative etiologies such as sarcoidosis. 4. Ascending colonic hypermetabolism and soft tissue fullness are highly suspicious for colonic polyp or carcinoma. 5. Right thyroid nodule is hypermetabolic but stable in size since a CT of 02/02/2020. Given patient age and comorbidities, of questionable clinical significance. If further evaluation is desired, thyroid ultrasound and possible biopsy could be performed. 6. Incidental findings, including: Tiny hiatal hernia. Similar ascending aortic dilatation. Coronary artery atherosclerosis. Aortic Atherosclerosis (ICD10-I70.0). Right renal artery aneurysm. Left inguinal hernia containing nonobstructive sigmoid.  Colonoscopy 11/27/02: Diverticulosis from the descending to sigmoid colon.  No polyps noted.  Excellent prep.  ASSESSMENT AND PLAN: Abnormal PET CT scan GERD Diverticulosis Patient presents with an abnormal PET CT scan that showed hypermetabolism in the ascending colon and mild distal hypermetabolism in the setting of a tiny hiatal hernia that is suspected to be due to esophagitis.  Patient does have occasional acid reflux for which she takes Pepcid as needed.  He is otherwise asymptomatic from a GI standpoint with exception of some mild diarrhea.  He has never had any polyps on prior colonoscopies.  His last colonoscopy was in 2004 that only showed diverticulosis.  I went over options with the patient today including either pursuing endoscopic evaluation or foregoing endoscopic evaluation for now.  PET/CT already suggests that the primary source of his squamous cell cancer is coming from his right mastoid, and he is actively symptomatic from that area (having right ear bleeding currently).  Thus it is unlikely that EGD or colonoscopy would change his current management.  The  locations of the hypermetabolism in the esophagus and colon would be typical locations for the development of adenocarcinoma and thus would not represent likely primary sources of his squamous cell cancer.  In the colon, it is possible that the patient may have a colon polyp, a second type of malignancy, or a metastatic squamous cell lesion to the colon.  To be able to know for certain, patient would have to accept the risks of undergoing a colonoscopy procedure and the risk of sedation associated with the procedure.  I went over this information with the patient, and he would like to wait to see  if he responds to the treatment offered by his oncologist first.  If he responds to the treatment from his cancer doctor, then he will reach out to me in order to rediscuss getting a colonoscopy and/or EGD in the future. - Continue Pepcid PRN - RTC PRN  Eulah Pont, MD  I spent 60 minutes of time, including in depth chart review, independent review of results as outlined above, communicating results with the patient directly, face-to-face time with the patient, coordinating care, ordering studies and medications as appropriate, and documentation.

## 2022-11-03 NOTE — Progress Notes (Signed)
START ON PATHWAY REGIMEN - Melanoma and Other Skin Cancers     A cycle is every 21 days:     Cemiplimab-rwlc   **Always confirm dose/schedule in your pharmacy ordering system**  Patient Characteristics: Cutaneous Squamous Cell Carcinoma, Distant Metastases Disease Classification: Cutaneous Squamous Cell Carcinoma Therapeutic Status: Distant Metastases Click here if multiple primary tumors are present.: false  Intent of Therapy: Non-Curative / Palliative Intent, Discussed with Patient

## 2022-11-04 ENCOUNTER — Other Ambulatory Visit: Payer: Self-pay

## 2022-11-04 ENCOUNTER — Telehealth: Payer: Self-pay | Admitting: Hematology and Oncology

## 2022-11-04 NOTE — Progress Notes (Signed)
Pharmacist Chemotherapy Monitoring - Initial Assessment    Anticipated start date: 11/11/22   The following has been reviewed per standard work regarding the patient's treatment regimen: The patient's diagnosis, treatment plan and drug doses, and organ/hematologic function Lab orders and baseline tests specific to treatment regimen  The treatment plan start date, drug sequencing, and pre-medications Prior authorization status  Patient's documented medication list, including drug-drug interaction screen and prescriptions for anti-emetics and supportive care specific to the treatment regimen The drug concentrations, fluid compatibility, administration routes, and timing of the medications to be used The patient's access for treatment and lifetime cumulative dose history, if applicable  The patient's medication allergies and previous infusion related reactions, if applicable   Changes made to treatment plan:  N/A  Follow up needed:  Pending authorization for treatment    Martin Curry, [facility], 11/04/2022  3:49 PM

## 2022-11-08 ENCOUNTER — Other Ambulatory Visit: Payer: Self-pay

## 2022-11-10 ENCOUNTER — Other Ambulatory Visit: Payer: Self-pay

## 2022-11-10 NOTE — Progress Notes (Unsigned)
[facility] [phone]   [phone]  PROGRESS NOTE  Patient Care Team: Garlan Fillers, MD as PCP - General (Internal Medicine) Garlan Fillers, MD as Consulting Physician (Internal Medicine) Exie Parody, MD (Hematology and Oncology) Bjorn Pippin, MD as Attending Physician (Urology) Mardella Layman, MD as Attending Physician (Gastroenterology) Janalyn Harder, MD (Inactive) as Consulting Physician (Dermatology) Lonie Peak, MD as Consulting Physician (Radiation Oncology) Malmfelt, Lise Auer, RN as Oncology Nurse Navigator (Oncology)  Hematological/Oncological History # Bone Lesions on CT/MRI # 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 Additionally there was noted to be a new enhancing lesion at the tip of the right greater trochanter measuring up to 1.7 cm also suspicious for metastatic disease. 09/27/2020: establish care with Dr. Leonides Schanz 10/29/2020: CT biopsy performed of left iliac bone lesion. Pathology showed bone and marrow without evidence of carcinoma.  11/11/2022: Cycle 1 Day 1 of cemplimab therapy.   Interval History:  Martin Curry 89 y.o. male with medical history significant for bone lesions noted on CT/MRI who presents for a follow up visit. The patient's last visit was on 11/03/2022. In the interim since the last visit he has completed chemotherapy education.  On exam today Martin Curry is accompanied by his daughter.  He reports he feels well today other than some increased bleeding from his right ear.  He notes that it has been dripping on his shirt on occasion.  He has to clean it up more frequently he feels.  He did  meet with gastroenterology and decided not to pursue colonoscopy at this time.  He is going to hold on this for the time being.  He was chemotherapy education was informative and answered most of his questions or concerns.  He denies any fevers, chills, sweats, nausea, vomiting or diarrhea.  His weight is been stable.  A full 10 point ROS is listed below.  The bulk of our discussion focused on assuring he had everything necessary to start and answered any last-minute questions.  We dressed all of his remaining questions and concerns.  He voices understanding is willing and able to proceed with Cemiplimab treatment today.  MEDICAL HISTORY:  Past Medical History:  Diagnosis Date   Atypical nevus 09/07/2002   left abdomen-slight   BCC (basal cell carcinoma of skin) 07/19/2014   left sideburn- +margin-exc.   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   Nodular basal cell carcinoma (BCC) 07/16/2015   left scalp-CX35FU   Polycythemia    Prostate cancer (HCC) 1990's   total  prostatectomy and adjuvant radiation. Last PSA was 0.02 in 09/2011   SCC (squamous cell carcinoma) 04/03/2009   forehead-txpbx   SCC (squamous cell carcinoma) 07/16/2015   left front scalp   SCC (squamous cell carcinoma) 07/16/2015   left front scalp-Cx35FU   SCCA (squamous cell carcinoma) of skin 12/20/2019   Right forehead (in situ)   SCCA  (squamous cell carcinoma) of skin 05/27/2020   Mid Parietal Scalp (in situ)   Superficial basal cell carcinoma (BCC) 11/21/2012   behind left ear-CX35FU, midback-CX35FU, right forehead-CX35FU-exc   Superficial basal cell carcinoma (BCC) 11/02/2017   left post neck-CX35FU   Superficial nodular basal cell carcinoma (BCC) 12/20/2019   Left temporal scalp   Superficial nodular basal cell carcinoma (BCC) 05/27/2020   Right Preauricular area (curet and 5FU)    SURGICAL HISTORY: Past Surgical History:  Procedure Laterality Date   ADJACENT TISSUE TRANSFER/TISSUE REARRANGEMENT N/A 05/22/2019   Procedure: COMPLEX CLOSURE OF NECK WOUND;  Surgeon: Allena Napoleon, MD;  Location: [facility] OR;  Service: Plastics;  Laterality: N/A;   ALLOGRAFT APPLICATION N/A 05/22/2019   Procedure: POSSIBLE FACIAL NERVE RECONSTRUCTION WITH NERVE ALLOGRAFT VS AUTOGRAFT;  Surgeon: Allena Napoleon, MD;  Location: [facility] OR;  Service: Plastics;  Laterality: N/A;   BLADDER SURGERY     due to prostatectomy.    CATARACT EXTRACTION     right   COLONOSCOPY     neg in the past; due in 2014 with [place] Dr. Jarold Motto   EYE SURGERY     50 years ago.    HERNIA REPAIR     MOHS SURGERY     NECK DISSECTION  05/22/2019    NECK DISSECTION   PAROTIDECTOMY  05/22/2019   PAROTIDECTOMY with facial nerve dissection    PAROTIDECTOMY Right 05/22/2019   Procedure: PAROTIDECTOMY;  Surgeon: Serena Colonel, MD;  Location: [facility] OR;  Service: ENT;  Laterality: Right;   PROSTATE SURGERY     RADICAL NECK DISSECTION Right 05/22/2019   Procedure: RADICAL NECK DISSECTION;  Surgeon: Serena Colonel, MD;  Location: [facility] OR;  Service: ENT;  Laterality: Right;   TONSILLECTOMY      SOCIAL HISTORY: Social History   Socioeconomic History   Marital status: Married    Spouse name: Not on file   Number of children: 3   Years of education: Not on file   Highest education level: Not on file  Occupational History    Comment: retired Scientist, 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    Occupation: retired  Tobacco Use   Smoking status: Never   Smokeless tobacco: Never  Vaping Use   Vaping status: Never Used  Substance and Sexual Activity   Alcohol use: No   Drug use: No   Sexual activity: Not on file  Other Topics Concern   Not on file  Social History Narrative   Not on file   Social Determinants of Health   Financial Resource Strain: Not on file  Food Insecurity: No Food Insecurity (10/29/2022)   Hunger Vital Sign    Worried About Running Out of Food in the Last Year: Never true    Ran Out of Food in the Last Year: Never true  Transportation Needs: No Transportation Needs (10/29/2022)   PRAPARE - Administrator, Civil Service (Medical): No    Lack of Transportation (Non-Medical): No  Physical Activity: Not on file  Stress: Not on file  Social Connections: Not on file  Intimate Partner Violence: Not At Risk (10/29/2022)   Humiliation, Afraid,  Rape, and Kick questionnaire    Fear of Current or Ex-Partner: No    Emotionally Abused: No    Physically Abused: No    Sexually Abused: No    FAMILY HISTORY: Family History  Problem Relation Age of Onset   Uterine cancer Mother 11   Ovarian cancer Mother    Lung cancer Father 72   Prostate cancer Brother    Prostate cancer Brother    Colon cancer Neg Hx    Stomach cancer Neg Hx    Esophageal cancer Neg Hx     ALLERGIES:  is allergic to methylprednisolone, penicillins, latex, and tape.  MEDICATIONS:  Current Outpatient Medications  Medication Sig Dispense Refill   amLODipine (NORVASC) 5 MG tablet Take 5 mg by mouth daily.      aspirin 81 MG tablet Take 81 mg by mouth daily.     benazepril (LOTENSIN) 10 MG tablet Take 10 mg by mouth daily.     famotidine-calcium carbonate-magnesium hydroxide (PEPCID COMPLETE) 10-800-165 MG chewable tablet Take one tablet PRN     levothyroxine (SYNTHROID) 25 MCG tablet Take 25 mcg by mouth daily before breakfast.     Multiple Vitamin (MULTIVITAMIN) tablet Take  1 tablet by mouth daily.     potassium citrate (UROCIT-K) 10 MEQ (1080 MG) SR tablet Take 10 mEq by mouth 3 (three) times daily with meals.     simvastatin (ZOCOR) 20 MG tablet Take 20 mg by mouth every evening.     No current facility-administered medications for this visit.    REVIEW OF SYSTEMS:   Constitutional: ( - ) fevers, ( - )  chills , ( - ) night sweats Eyes: ( - ) blurriness of vision, ( - ) double vision, ( - ) watery eyes Ears, nose, mouth, throat, and face: ( - ) mucositis, ( - ) sore throat Respiratory: ( - ) cough, ( - ) dyspnea, ( - ) wheezes Cardiovascular: ( - ) palpitation, ( - ) chest discomfort, ( - ) lower extremity swelling Gastrointestinal:  ( - ) nausea, ( - ) heartburn, ( - ) change in bowel habits Skin: ( - ) abnormal skin rashes Lymphatics: ( - ) new lymphadenopathy, ( - ) easy bruising Neurological: ( - ) numbness, ( - ) tingling, ( - ) new weaknesses Behavioral/Psych: ( - ) mood change, ( - ) new changes  All other systems were reviewed with the patient and are negative.  PHYSICAL EXAMINATION: ECOG PERFORMANCE STATUS: 1 - Symptomatic but completely ambulatory  There were no vitals filed for this visit.   There were no vitals filed for this visit.    GENERAL: Well-appearing elderly Caucasian male, alert, no distress and comfortable SKIN: skin color, texture, turgor are normal, no rashes or significant lesions EYES: conjunctiva are pink and non-injected, sclera clear LUNGS: clear to auscultation and percussion with normal breathing effort HEART: regular rate & rhythm and no murmurs and no lower extremity edema Musculoskeletal: no cyanosis of digits and no clubbing  PSYCH: alert & oriented x 3, fluent speech NEURO: no focal motor/sensory deficits  LABORATORY DATA:  I have reviewed the data as listed    Latest Ref Rng & Units 10/14/2022    8:31 AM 09/14/2022    7:30 AM 08/25/2022   11:27 AM  CBC  WBC 4.0 - 10.5 K/uL 7.8  7.4  6.6   Hemoglobin  13.0 - 17.0 g/dL 57.8  46.9  62.9   Hematocrit 39.0 - 52.0 % 46.5  46.8  45.7   Platelets 150 - 400 K/uL 232  220  250        Latest Ref Rng & Units 10/14/2022    8:31 AM 09/14/2022    7:30 AM 08/25/2022   11:27 AM  CMP  Glucose 70 - 99 mg/dL 161  096  91   BUN 8 - 23 mg/dL 18  22  15    Creatinine 0.61 - 1.24 mg/dL 0.45  4.09  8.11   Sodium 135 - 145 mmol/L 140  137  141   Potassium 3.5 - 5.1 mmol/L 4.2  4.1  4.9   Chloride 98 - 111 mmol/L 104  108  107   CO2 22 - 32 mmol/L 30  22  30    Calcium 8.9 - 10.3 mg/dL 9.9  9.2  91.4   Total Protein 6.5 - 8.1 g/dL 7.0   6.4   Total Bilirubin 0.3 - 1.2 mg/dL 0.6   0.7   Alkaline Phos 38 - 126 U/L 75   76   AST 15 - 41 U/L 22   23   ALT 0 - 44 U/L 24   24     Lab Results  Component Value Date   MPROTEIN Not Observed 09/27/2020   Lab Results  Component Value Date   KPAFRELGTCHN 25.6 (H) 09/27/2020   LAMBDASER 23.0 09/27/2020   KAPLAMBRATIO 1.11 09/27/2020    RADIOGRAPHIC STUDIES: No results found.  ASSESSMENT & PLAN Martin Curry 89 y.o. 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  At this time the patient's clinical findings are complicated.  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  It is unclear if these 2 processes are the same or 2 separate primaries.  Additionally there is no clear evidence of a primary squamous cell carcinoma (however my concern is that the area behind the right ear may represent the same process).  Patient also does have PET avid areas of his bowels which I do not suspect are related, but we will have evaluated further with EGD/colonoscopy as needed.  At this time the best treatment course would be to treat with Cemiplimab which should be effective at treating both basal cell carcinoma and  squamous cell carcinoma, effectively treating both even if they are separate processes.  Overall he voices understanding of this complicated situation and our plan moving forward.  Of note radiation oncology and ENT are on board and willing to assist with management of his current findings.  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 Previously discussed with radiology who notes a repeat biopsy was not recommended and repeat imaging would be preferred.  --negative multiple myeloma labs with SPEP and serum free light chains showing no evidence of monoclonal gammopathy.  --Given his prior history of prostate cancer we ordered a PSA, which was normal. Repeated PSA previously, found to be within normal limits --CT C/A/P shows no evidence of disease elsewhere in the body in August 2022. Most recent PET CT scan on 10/02/2022 showed hypermetabolic osseous metastasis with a dominant mass centered about the inferior medial portion of the right mastoid, suspicious for site of primary squamous cell carcinoma. -- PET CT scan shows evidence of lesion  behind the right ear as well as the known pelvic metastasis. -- Reviewed tissue with pathology to determine if both lesions are the same pathology or different origins.  At this time findings are most consistent with a basal cell primary behind his ear and squamous cell carcinoma of the pelvis.  It is possible these at the same pathology with the basal/squamous crossover, though they may represent separate primaries.  Either way Cemiplimab therapy should be adequate to treat both. -- PD-L1 testing showed 0% PD-L1 score, however the use of cemiplimab does not require PD-L1 positivity (lack of PD-L1 prevents the use of pembrolizumab)  PLAN: --Proceed with cycle 1 day 1 of Cemiplimab immunotherapy today --Labs today show white blood cell count 7.9, hemoglobin 16.8, MCV  92.6, and platelets of 229 --Will plan for a repeat  PET CT scan at the 41-month mark. -- Return to clinic for next Cycle of cemiplimab in 3 weeks time.   # Bleeding of the Ear -- Thought to be secondary due to erosion of the tumor into his ear canal. -- Bleeding has been steady with some moderate recent worsening.  Has been persistent for months. -- Will likely be treated by immunotherapy, however if it worsens could consider palliative radiation to the area in order to stanch the bleeding.  No orders of the defined types were placed in this encounter.   All questions were answered. The patient knows to call the clinic with any problems, questions or concerns.  A total of more than 30 minutes were spent on this encounter with face-to-face time and non-face-to-face time, including preparing to see the patient, ordering tests and/or medications, counseling the patient and coordination of care as outlined above.   Ulysees Barns, MD Department of Hematology/Oncology [facility] Phone: [phone] Pager: [phone] Email: [email or web address]  11/10/2022 9:48 PM

## 2022-11-11 ENCOUNTER — Other Ambulatory Visit: Payer: Self-pay | Admitting: *Deleted

## 2022-11-11 ENCOUNTER — Other Ambulatory Visit: Payer: Self-pay | Admitting: Hematology and Oncology

## 2022-11-11 ENCOUNTER — Inpatient Hospital Stay: Payer: Medicare Other

## 2022-11-11 ENCOUNTER — Inpatient Hospital Stay (HOSPITAL_BASED_OUTPATIENT_CLINIC_OR_DEPARTMENT_OTHER): Payer: Medicare Other | Admitting: Hematology and Oncology

## 2022-11-11 VITALS — BP 134/75 | HR 65 | Temp 98.0°F | Resp 13 | Wt 179.6 lb

## 2022-11-11 DIAGNOSIS — C07 Malignant neoplasm of parotid gland: Secondary | ICD-10-CM

## 2022-11-11 DIAGNOSIS — C4492 Squamous cell carcinoma of skin, unspecified: Secondary | ICD-10-CM

## 2022-11-11 DIAGNOSIS — C7951 Secondary malignant neoplasm of bone: Secondary | ICD-10-CM | POA: Diagnosis not present

## 2022-11-11 DIAGNOSIS — Z5112 Encounter for antineoplastic immunotherapy: Secondary | ICD-10-CM | POA: Diagnosis not present

## 2022-11-11 DIAGNOSIS — Z79620 Long term (current) use of immunosuppressive biologic: Secondary | ICD-10-CM | POA: Diagnosis not present

## 2022-11-11 DIAGNOSIS — C4441 Basal cell carcinoma of skin of scalp and neck: Secondary | ICD-10-CM

## 2022-11-11 LAB — CMP (CANCER CENTER ONLY)
ALT: 21 U/L (ref 0–44)
AST: 23 U/L (ref 15–41)
Albumin: 4.5 g/dL (ref 3.5–5.0)
Alkaline Phosphatase: 85 U/L (ref 38–126)
Anion gap: 6 (ref 5–15)
BUN: 17 mg/dL (ref 8–23)
CO2: 27 mmol/L (ref 22–32)
Calcium: 10.3 mg/dL (ref 8.9–10.3)
Chloride: 107 mmol/L (ref 98–111)
Creatinine: 1.22 mg/dL (ref 0.61–1.24)
GFR, Estimated: 57 mL/min — ABNORMAL LOW (ref >60)
Glucose, Bld: 91 mg/dL (ref 70–99)
Potassium: 4.3 mmol/L (ref 3.5–5.1)
Sodium: 140 mmol/L (ref 135–145)
Total Bilirubin: 0.6 mg/dL (ref 0.3–1.2)
Total Protein: 7.4 g/dL (ref 6.5–8.1)

## 2022-11-11 LAB — CBC WITH DIFFERENTIAL (CANCER CENTER ONLY)
Abs Immature Granulocytes: 0.02 10*3/uL (ref 0.00–0.07)
Basophils Absolute: 0 10*3/uL (ref 0.0–0.1)
Basophils Relative: 1 %
Eosinophils Absolute: 0.1 10*3/uL (ref 0.0–0.5)
Eosinophils Relative: 2 %
HCT: 49.7 % (ref 39.0–52.0)
Hemoglobin: 16.8 g/dL (ref 13.0–17.0)
Immature Granulocytes: 0 %
Lymphocytes Relative: 13 %
Lymphs Abs: 1 10*3/uL (ref 0.7–4.0)
MCH: 31.3 pg (ref 26.0–34.0)
MCHC: 33.8 g/dL (ref 30.0–36.0)
MCV: 92.6 fL (ref 80.0–100.0)
Monocytes Absolute: 0.6 10*3/uL (ref 0.1–1.0)
Monocytes Relative: 7 %
Neutro Abs: 6.1 10*3/uL (ref 1.7–7.7)
Neutrophils Relative %: 77 %
Platelet Count: 229 10*3/uL (ref 150–400)
RBC: 5.37 MIL/uL (ref 4.22–5.81)
RDW: 13.4 % (ref 11.5–15.5)
WBC Count: 7.9 10*3/uL (ref 4.0–10.5)
nRBC: 0 % (ref 0.0–0.2)

## 2022-11-11 LAB — TSH: TSH: 3.231 u[IU]/mL (ref 0.350–4.500)

## 2022-11-11 MED ORDER — SODIUM CHLORIDE 0.9 % IV SOLN: Freq: Once | INTRAVENOUS | Status: AC

## 2022-11-11 MED ORDER — ONDANSETRON HCL 8 MG PO TABS: 8.0000 mg | ORAL_TABLET | Freq: Three times a day (TID) | ORAL | 1 refills | Status: AC | PRN

## 2022-11-11 MED ORDER — SODIUM CHLORIDE 0.9 % IV SOLN
350.0000 mg | Freq: Once | INTRAVENOUS | Status: AC
  Administered 2022-11-11: 350 mg via INTRAVENOUS
  Filled 2022-11-11: qty 7

## 2022-11-11 NOTE — Patient Instructions (Signed)
[place] [facility]  Discharge Instructions: Thank you for choosing [facility] to provide your oncology and hematology care.   If you have a lab appointment with the Cancer Center, please go directly to the Cancer Center and check in at the registration area.   Wear comfortable clothing and clothing appropriate for easy access to any Portacath or PICC line.   We strive to give you quality time with your provider. You may need to reschedule your appointment if you arrive late (15 or more minutes).  Arriving late affects you and other patients whose appointments are after yours.  Also, if you miss three or more appointments without notifying the office, you may be dismissed from the clinic at the provider's discretion.      For prescription refill requests, have your pharmacy contact our office and allow 72 hours for refills to be completed.    Today you received the following chemotherapy and/or immunotherapy agents Libtayo      To help prevent nausea and vomiting after your treatment, we encourage you to take your nausea medication as directed.  BELOW ARE SYMPTOMS THAT SHOULD BE REPORTED IMMEDIATELY: *FEVER GREATER THAN 100.4 F (38 C) OR HIGHER *CHILLS OR SWEATING *NAUSEA AND VOMITING THAT IS NOT CONTROLLED WITH YOUR NAUSEA MEDICATION *UNUSUAL SHORTNESS OF BREATH *UNUSUAL BRUISING OR BLEEDING *URINARY PROBLEMS (pain or burning when urinating, or frequent urination) *BOWEL PROBLEMS (unusual diarrhea, constipation, pain near the anus) TENDERNESS IN MOUTH AND THROAT WITH OR WITHOUT PRESENCE OF ULCERS (sore throat, sores in mouth, or a toothache) UNUSUAL RASH, SWELLING OR PAIN  UNUSUAL VAGINAL DISCHARGE OR ITCHING   Items with * indicate a potential emergency and should be followed up as soon as possible or go to the Emergency Department if any problems should occur.  Please show the CHEMOTHERAPY ALERT CARD or IMMUNOTHERAPY ALERT CARD at check-in  to the Emergency Department and triage nurse.  Should you have questions after your visit or need to cancel or reschedule your appointment, please contact [place] [facility]  Dept: [phone]  and follow the prompts.  Office hours are 8:00 a.m. to 4:30 p.m. Monday - Friday. Please note that voicemails left after 4:00 p.m. may not be returned until the following business day.  We are closed weekends and major holidays. You have access to a nurse at all times for urgent questions. Please call the main number to the clinic Dept: [phone] and follow the prompts.   For any non-urgent questions, you may also contact your provider using MyChart. We now offer e-Visits for anyone 25 and older to request care online for non-urgent symptoms. For details visit [email or web address].   Also download the MyChart app! Go to the app store, search "MyChart", open the app, select [place], and log in with your MyChart username and password.  Cemiplimab Injection What is this medication? CEMIPLIMAB (se MIP li mab) treats skin cancer and lung cancer. It works by helping your immune system slow or stop the spread of cancer cells. It is a monoclonal antibody. This medicine may be used for other purposes; ask your health care provider or pharmacist if you have questions. COMMON BRAND NAME(S): LIBTAYO What should I tell my care team before I take this medication? They need to know if you have any of these conditions: Allogeneic stem cell transplant (uses someone else's stem cells) Autoimmune diseases, such as Crohn disease, ulcerative colitis, lupus History of chest radiation Nervous  system problems, such as Guillain-Barre syndrome or myasthenia gravis Organ transplant An unusual or allergic reaction to cemiplimab, other medications, foods, dyes, or preservatives Pregnant or trying to get pregnant Breastfeeding How should I use this medication? This medication is infused  into a vein. It is given by your care team in a hospital or clinic setting. A special MedGuide will be given to you before each treatment. Be sure to read this information carefully each time. Talk to your care team about the use of this medication in children. Special care may be needed. Overdosage: If you think you have taken too much of this medicine contact a poison control center or emergency room at once. NOTE: This medicine is only for you. Do not share this medicine with others. What if I miss a dose? Keep appointments for follow-up doses. It is important not to miss your dose. Call your care team if you are unable to keep an appointment. What may interact with this medication? Interactions have not been studied. This list may not describe all possible interactions. Give your health care provider a list of all the medicines, herbs, non-prescription drugs, or dietary supplements you use. Also tell them if you smoke, drink alcohol, or use illegal drugs. Some items may interact with your medicine. What should I watch for while using this medication? Visit your care team for regular checks on your progress. It may be some time before you see the benefit from this medication. You may need blood work done while taking this medication. This medication may cause serious skin reactions. They can happen weeks to months after starting the medication. Contact your care team right away if you notice fevers or flu-like symptoms with a rash. The rash may be red or purple and then turn into blisters or peeling of the skin. You may also notice a red rash with swelling of the face, lips, or lymph nodes in your neck or under your arms. Tell your care team right away if you have any change in your eyesight. Talk to your care team if you may be pregnant. You will need a negative pregnancy test before starting this medication. Contraception is recommended while taking this medication and for 4 months after the last  dose. Your care team can help you find the option that works for you. Do not breastfeed while taking this medication and for at least 4 months after the last dose. What side effects may I notice from receiving this medication? Side effects that you should report to your care team as soon as possible: Allergic reactions--skin rash, itching, hives, swelling of the face, lips, tongue, or throat Dry cough, shortness of breath or trouble breathing Eye pain, redness, irritation, or discharge with blurry or decreased vision Heart muscle inflammation--unusual weakness or fatigue, shortness of breath, chest pain, fast or irregular heartbeat, dizziness, swelling of the ankles, feet, or hands Hormone gland problems--headache, sensitivity to light, unusual weakness or fatigue, dizziness, fast or irregular heartbeat, increased sensitivity to cold or heat, excessive sweating, constipation, hair loss, increased thirst or amount of urine, tremors or shaking, irritability Infusion reactions--chest pain, shortness of breath or trouble breathing, feeling faint or lightheaded Kidney injury (glomerulonephritis)--decrease in the amount of urine, red or dark brown urine, foamy or bubbly urine, swelling of the ankles, hands, or feet Liver injury--right upper belly pain, loss of appetite, nausea, light-colored stool, dark yellow or brown urine, yellowing skin or eyes, unusual weakness or fatigue Pain, tingling, or numbness in the hands or  feet, muscle weakness, change in vision, confusion or trouble speaking, loss of balance or coordination, trouble walking, seizures Rash, fever, and swollen lymph nodes Redness, blistering, peeling, or loosening of the skin, including inside the mouth Sudden or severe stomach pain, bloody diarrhea, fever, nausea, vomiting Side effects that usually do not require medical attention (report these to your care team if they continue or are bothersome): Bone, joint, or muscle  pain Diarrhea Fatigue Loss of appetite Nausea Skin rash This list may not describe all possible side effects. Call your doctor for medical advice about side effects. You may report side effects to FDA at 1-800-FDA-1088. Where should I keep my medication? This medication is given in a hospital or clinic and will not be stored at home. NOTE: This sheet is a summary. It may not cover all possible information. If you have questions about this medicine, talk to your doctor, pharmacist, or health care provider.  2024 Elsevier/Gold Standard (2022-03-30 00:00:00)

## 2022-11-12 ENCOUNTER — Telehealth: Payer: Self-pay | Admitting: Hematology and Oncology

## 2022-11-12 ENCOUNTER — Telehealth: Payer: Self-pay

## 2022-11-12 ENCOUNTER — Encounter: Payer: Self-pay | Admitting: Hematology and Oncology

## 2022-11-12 LAB — T4: T4, Total: 8 ug/dL (ref 4.5–12.0)

## 2022-11-12 NOTE — Telephone Encounter (Signed)
-----   Message from Nurse Julieanne Manson sent at 11/11/2022  4:41 PM EDT ----- Regarding: Leonides Schanz 1st Tx F/U call - Leanord Hawking 1st Tx F/U call - Libtayo

## 2022-11-12 NOTE — Telephone Encounter (Signed)
Martin Curry states that he is doing fine. He is eating, drinking, and urinating well. He knows to call the office at [phone] if he has any questions or concerns.

## 2022-11-12 NOTE — Progress Notes (Signed)
Received call from patient after receiving my card per my request.  Introduced myself as Dance movement psychotherapist and to offer available resources.  Discussed one-time $1000 Marketing executive to assist with personal expenses while going through treatment. Based on verbal income guidelines, patient states they are a little over.  He has questions regarding applying for manufacturer drug assistance if needed. Advised patient someone in pharmacy would assist with that if needed. Advised should he receive a bill regarding, please feel free to reach out to me if no one has contact him regarding assistance. He verbalized understanding.  He has my card for any additional financial questions or concerns.

## 2022-11-26 ENCOUNTER — Telehealth: Payer: Self-pay | Admitting: *Deleted

## 2022-11-26 ENCOUNTER — Encounter: Payer: Self-pay | Admitting: Hematology and Oncology

## 2022-11-26 NOTE — Telephone Encounter (Signed)
TCT pt's daughter, Eunice Blase regarding her father's treatment and the tumor. No answer but was able to leave vm message regarding [facility] appt tomorrow to look at the lesion behind his right ear.  Advised that we will call her and Mr. Rogala about the time for this visit tomorrow morning. Also advised that the pt's double vision is not likely due to his immunotherapy treatment or the tumor. Advised to monitor his vision.  TCT patient as well. Made aware of the above. He is agreeable to coming in tomorrow to [facility]. He will be expecting our call in the morning.

## 2022-11-26 NOTE — Telephone Encounter (Signed)
Received vm message from pt. He states that after he 1st immunotherapy treatment he experienced some double vision though it seems to be resolving. He also states that the area on his head where he had a biopsy done in August is not healed. After the treatment this area started bleeding and now has tissue growing and protruding from this wound.  He would like to know what he should do at this time.  Please advise

## 2022-11-27 ENCOUNTER — Inpatient Hospital Stay: Payer: Medicare Other | Attending: Hematology and Oncology | Admitting: Physician Assistant

## 2022-11-27 ENCOUNTER — Other Ambulatory Visit (HOSPITAL_COMMUNITY): Payer: Self-pay

## 2022-11-27 VITALS — BP 128/69 | HR 70 | Temp 98.0°F | Resp 14 | Wt 181.3 lb

## 2022-11-27 DIAGNOSIS — C4441 Basal cell carcinoma of skin of scalp and neck: Secondary | ICD-10-CM | POA: Insufficient documentation

## 2022-11-27 DIAGNOSIS — C7951 Secondary malignant neoplasm of bone: Secondary | ICD-10-CM | POA: Diagnosis not present

## 2022-11-27 DIAGNOSIS — Z5112 Encounter for antineoplastic immunotherapy: Secondary | ICD-10-CM | POA: Diagnosis not present

## 2022-11-27 DIAGNOSIS — Z79620 Long term (current) use of immunosuppressive biologic: Secondary | ICD-10-CM | POA: Insufficient documentation

## 2022-11-27 DIAGNOSIS — C4492 Squamous cell carcinoma of skin, unspecified: Secondary | ICD-10-CM

## 2022-11-27 DIAGNOSIS — Z5189 Encounter for other specified aftercare: Secondary | ICD-10-CM | POA: Diagnosis not present

## 2022-11-27 NOTE — Progress Notes (Signed)
Symptom Management Consult Note [facility]    Patient Care Team: Garlan Fillers, MD as PCP - General (Internal Medicine) Garlan Fillers, MD as Consulting Physician (Internal Medicine) Exie Parody, MD (Hematology and Oncology) Bjorn Pippin, MD as Attending Physician (Urology) Mardella Layman, MD as Attending Physician (Gastroenterology) Janalyn Harder, MD (Inactive) as Consulting Physician (Dermatology) Lonie Peak, MD as Consulting Physician (Radiation Oncology) Malmfelt, Lise Auer, RN as Oncology Nurse Navigator (Oncology)    Name / MRN / DOB: Martin Curry  [id number]  November 07, 1932   Date of visit: 11/27/2022   Chief Complaint/Reason for visit: wound check   Current Therapy: Libtayo  Last treatment:  Day 1   Cycle 1 on 11/11/22   ASSESSMENT & PLAN: Patient is a 89 y.o. male with oncologic history of basal cell carcinoma and squamous cell carcinoma followed by Dr. Leonides Schanz.  I have viewed most recent oncology note and lab work.    # Basal cell carcinoma and squamous cell carcinoma - Next appointment with oncologist is 12/23/22 -Patient is afebrile, well-appearing.  He has lesion behind his right ear.  Exam without signs of infection. Will hold of antibiotics at this time. -Discussed with Dr. Leonides Schanz who agrees growth is likely inflammatory response from starting immunotherapy.  Wound care was provided by RN in clinic.   Strict ED precautions discussed should symptoms worsen.   Heme/Onc History: Oncology History  Basal cell carcinoma (BCC) of skin of neck  05/02/2019 Cancer Staging   Staging form: Cutaneous Carcinoma of the Head and Neck, AJCC 8th Edition - Clinical stage from 05/02/2019: Stage III (cT3, cN0, cM0) - Signed by Lonie Peak, MD on 05/03/2019   05/03/2019 Initial Diagnosis   Basal cell carcinoma (BCC) of skin of neck   06/16/2019 Cancer Staging   Staging form: Cutaneous Carcinoma of the Head and Neck, AJCC 8th Edition - Pathologic  stage from 06/16/2019: Stage III (pT3, pN1, cM0) - Signed by Lonie Peak, MD on 06/20/2019   11/11/2022 -  Chemotherapy   Patient is on Treatment Plan : ADVANCED CUTANEOUS SQUAMOUS CELL CARCINOMA Cemiplimab q21d     Metastasis to bone (HCC)  10/30/2022 Initial Diagnosis   Metastasis to bone (HCC)   11/11/2022 -  Chemotherapy   Patient is on Treatment Plan : ADVANCED CUTANEOUS SQUAMOUS CELL CARCINOMA Cemiplimab q21d     Squamous cell carcinoma of skin  11/03/2022 Initial Diagnosis   Squamous cell carcinoma of skin   11/11/2022 -  Chemotherapy   Patient is on Treatment Plan : ADVANCED CUTANEOUS SQUAMOUS CELL CARCINOMA Cemiplimab q21d         Interval history-: Martin Curry is a 89 y.o. male with oncologic history as above presenting to [facility] today with chief complaint of wound check.  He is accompanied by his daughter who provides additional history.  Daughter states 3 days ago she noticed a lesion behind patient's right ear had grown in size.  There has been slightly increased amount of bloody drainage as well.  Patient has also continued to have bloody mucus draining from his right ear.  That drainage has been unchanged in amount or frequency.  Daughter has been providing wound care every other day with changing the bandage and applying a thin layer of Vaseline.  They became concerned that the area had grown which prompted them to call the clinic for evaluation.  Patient has not had any fevers or chills.  He denies any pain localized to lesion.  ROS  All other systems are reviewed and are negative for acute change except as noted in the HPI.    Allergies  Allergen Reactions   Methylprednisolone     Severe hiccups   Penicillins     Did it involve swelling of the face/tongue/throat, SOB, or low BP? Unknown Did it involve sudden or severe rash/hives, skin peeling, or any reaction on the inside of your mouth or nose? Unknown Did you need to seek medical attention at a hospital  or doctor's office? Unknown When did it last happen?      Childhood allergy If all above answers are "NO", may proceed with cephalosporin use.    Latex Rash   Tape Rash    Skin gets red and a rash develops with "plastic tape" Skin gets red and a rash develops with "plastic tape"     Past Medical History:  Diagnosis Date   Atypical nevus 09/07/2002   left abdomen-slight   BCC (basal cell carcinoma of skin) 07/19/2014   left sideburn- +margin-exc.   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   Nodular basal cell carcinoma (BCC) 07/16/2015   left scalp-CX35FU   Polycythemia    Prostate cancer (HCC) 1990's   total prostatectomy and adjuvant radiation. Last PSA was 0.02 in 09/2011   SCC (squamous cell carcinoma) 04/03/2009   forehead-txpbx   SCC (squamous cell carcinoma) 07/16/2015   left front scalp   SCC (squamous cell carcinoma) 07/16/2015   left front scalp-Cx35FU   SCCA (squamous cell carcinoma) of skin 12/20/2019   Right forehead (in situ)   SCCA (squamous cell carcinoma) of skin 05/27/2020   Mid Parietal Scalp (in situ)   Superficial basal cell carcinoma (BCC) 11/21/2012   behind left ear-CX35FU, midback-CX35FU, right forehead-CX35FU-exc   Superficial basal cell carcinoma (BCC) 11/02/2017   left post neck-CX35FU   Superficial nodular basal cell carcinoma (BCC) 12/20/2019   Left temporal scalp    Superficial nodular basal cell carcinoma (BCC) 05/27/2020   Right Preauricular area (curet and 5FU)     Past Surgical History:  Procedure Laterality Date   ADJACENT TISSUE TRANSFER/TISSUE REARRANGEMENT N/A 05/22/2019   Procedure: COMPLEX CLOSURE OF NECK WOUND;  Surgeon: Allena Napoleon, MD;  Location: [facility] OR;  Service: Plastics;  Laterality: N/A;   ALLOGRAFT APPLICATION N/A 05/22/2019   Procedure: POSSIBLE FACIAL NERVE RECONSTRUCTION WITH NERVE ALLOGRAFT VS AUTOGRAFT;  Surgeon: Allena Napoleon, MD;  Location: [facility] OR;  Service: Plastics;  Laterality: N/A;   BLADDER SURGERY     due to prostatectomy.    CATARACT EXTRACTION     right   COLONOSCOPY     neg in the past; due in 2014 with [place] Dr. Jarold Motto   EYE SURGERY     50 years ago.    HERNIA REPAIR     MOHS SURGERY  NECK DISSECTION  05/22/2019    NECK DISSECTION   PAROTIDECTOMY  05/22/2019   PAROTIDECTOMY with facial nerve dissection    PAROTIDECTOMY Right 05/22/2019   Procedure: PAROTIDECTOMY;  Surgeon: Serena Colonel, MD;  [facility];  Service: ENT;  Laterality: Right;   PROSTATE SURGERY     RADICAL NECK DISSECTION Right 05/22/2019   Procedure: RADICAL NECK DISSECTION;  Surgeon: Serena Colonel, MD;  Location: [facility] OR;  Service: ENT;  Laterality: Right;   TONSILLECTOMY      Social History   Socioeconomic History   Marital status: Married    Spouse name: Not on file   Number of children: 3   Years of education: Not on file   Highest education level: Not on file  Occupational History    Comment: retired Scientist, forensic; 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   Occupation: retired  Tobacco Use   Smoking status: Never   Smokeless tobacco: Never  Vaping Use   Vaping status: Never Used  Substance and Sexual Activity   Alcohol use: No   Drug use: No   Sexual activity: Not on file  Other Topics Concern   Not on file  Social History Narrative   Not on file   Social Determinants of Health   Financial Resource Strain: Not on file  Food  Insecurity: No Food Insecurity (10/29/2022)   Hunger Vital Sign    Worried About Running Out of Food in the Last Year: Never true    Ran Out of Food in the Last Year: Never true  Transportation Needs: No Transportation Needs (10/29/2022)   PRAPARE - Administrator, Civil Service (Medical): No    Lack of Transportation (Non-Medical): No  Physical Activity: Not on file  Stress: Not on file  Social Connections: Not on file  Intimate Partner Violence: Not At Risk (10/29/2022)   Humiliation, Afraid, Rape, and Kick questionnaire    Fear of Current or Ex-Partner: No    Emotionally Abused: No    Physically Abused: No    Sexually Abused: No    Family History  Problem Relation Age of Onset   Uterine cancer Mother 28   Ovarian cancer Mother    Lung cancer Father 63   Prostate cancer Brother    Prostate cancer Brother    Colon cancer Neg Hx    Stomach cancer Neg Hx    Esophageal cancer Neg Hx      Current Outpatient Medications:    amLODipine (NORVASC) 5 MG tablet, Take 5 mg by mouth daily. , Disp: , Rfl:    aspirin 81 MG tablet, Take 81 mg by mouth daily., Disp: , Rfl:    benazepril (LOTENSIN) 10 MG tablet, Take 10 mg by mouth daily., Disp: , Rfl:    famotidine-calcium carbonate-magnesium hydroxide (PEPCID COMPLETE) 10-800-165 MG chewable tablet, Take one tablet PRN, Disp: , Rfl:    levothyroxine (SYNTHROID) 25 MCG tablet, Take 25 mcg by mouth daily before breakfast., Disp: , Rfl:    Multiple Vitamin (MULTIVITAMIN) tablet, Take 1 tablet by mouth daily., Disp: , Rfl:    ondansetron (ZOFRAN) 8 MG tablet, Take 1 tablet (8 mg total) by mouth every 8 (eight) hours as needed for nausea or vomiting., Disp: 20 tablet, Rfl: 1   potassium citrate (UROCIT-K) 10 MEQ (1080 MG) SR tablet, Take 10 mEq by mouth 3 (three) times daily with meals., Disp: , Rfl:    simvastatin (ZOCOR) 20 MG tablet, Take 20 mg by mouth every evening., Disp: , Rfl:  PHYSICAL EXAM: ECOG FS:1 - Symptomatic but  completely ambulatory    Vitals:   11/27/22 1200  BP: 128/69  Pulse: 70  Resp: 14  Temp: 98 F (36.7 C)  TempSrc: Oral  SpO2: 96%  Weight: 181 lb 4.8 oz (82.2 kg)   Physical Exam Vitals and nursing note reviewed.  Constitutional:      Appearance: He is not ill-appearing or toxic-appearing.  HENT:     Head: Normocephalic.  Eyes:     Conjunctiva/sclera: Conjunctivae normal.  Neck:     Comments: Please see media below.   No purulent drainage from wound. No erythema streaking from wound.  Cardiovascular:     Rate and Rhythm: Normal rate.  Pulmonary:     Effort: Pulmonary effort is normal.  Abdominal:     General: There is no distension.  Musculoskeletal:     Cervical back: Normal range of motion.  Skin:    General: Skin is warm and dry.  Neurological:     Mental Status: He is alert.        LABORATORY DATA: I have reviewed the data as listed    Latest Ref Rng & Units 11/11/2022    1:01 PM 10/14/2022    8:31 AM 09/14/2022    7:30 AM  CBC  WBC 4.0 - 10.5 K/uL 7.9  7.8  7.4   Hemoglobin 13.0 - 17.0 g/dL 40.9  81.1  91.4   Hematocrit 39.0 - 52.0 % 49.7  46.5  46.8   Platelets 150 - 400 K/uL 229  232  220         Latest Ref Rng & Units 11/11/2022    1:01 PM 10/14/2022    8:31 AM 09/14/2022    7:30 AM  CMP  Glucose 70 - 99 mg/dL 91  782  956   BUN 8 - 23 mg/dL 17  18  22    Creatinine 0.61 - 1.24 mg/dL 2.13  0.86  5.78   Sodium 135 - 145 mmol/L 140  140  137   Potassium 3.5 - 5.1 mmol/L 4.3  4.2  4.1   Chloride 98 - 111 mmol/L 107  104  108   CO2 22 - 32 mmol/L 27  30  22    Calcium 8.9 - 10.3 mg/dL 46.9  9.9  9.2   Total Protein 6.5 - 8.1 g/dL 7.4  7.0    Total Bilirubin 0.3 - 1.2 mg/dL 0.6  0.6    Alkaline Phos 38 - 126 U/L 85  75    AST 15 - 41 U/L 23  22    ALT 0 - 44 U/L 21  24         RADIOGRAPHIC STUDIES (from last 24 hours if applicable) I have personally reviewed the radiological images as listed and agreed with the findings in the report. No  results found.      Visit Diagnosis: 1. Basal cell carcinoma (BCC) of skin of neck   2. Squamous cell carcinoma of skin   3. Visit for wound check      No orders of the defined types were placed in this encounter.   All questions were answered. The patient knows to call the clinic with any problems, questions or concerns. No barriers to learning was detected.  A total of more than 20 minutes were spent on this encounter with face-to-face time and non-face-to-face time, including preparing to see the patient, counseling the patient and coordination of care as outlined above.  Thank you for allowing me to participate in the care of this patient.    Shanon Ace, PA-C Department of Hematology/Oncology [facility] at [facility] Phone: [phone]  [phone]    11/27/2022 4:20 PM

## 2022-11-27 NOTE — Progress Notes (Signed)
Pt was seen by this RN in [facility] to assess right neck wound. Pt is being treated for squamous cell carcinoma of the skin with immunotherapy. He had his first treatment on 11/11/22. Pt and his daughter noted increasing size of the lesion behind his right ear after the first treatment. It is also bleeding a bit more. Pt also having bloody mucous like drainage from his right ear as well. Saw pt with Daphane Shepherd, PA in [facility]. Instructed pt and daughter on wound care.  OK to wash gently with warm soapy water, ok to let water from shower gently run over the wound.Dennie Bible dry. Then cover with vaseline gauze and extra gauze on top of that for absorption. Use hypofix tape.   Demonstrated  wound care to daughter. Also suggested using saline to help remove dressing so as to not pull any scabs off-if pt changing dressing and not in the shower. Provided supplies of vaseline gauze, regular gauze, hypofix tape and some 10cc saline syringes. Instructed on good hand hygiene and use of gloves for  wound care. Instructed on when to call here-fever, chills, confusion, increased growth, swelling, bleeding. Pt and daughter voiced understanding.

## 2022-11-28 NOTE — H&P (Signed)
Referring Physician(s): Dorsey,John T IV  Supervising Physician: Simonne Come  Patient Status:  [facility] OP  Chief Complaint:  "I'm getting a port a cath put in"  Subjective: Pt known to IR team from left iliac bone lesion biopsy in 2022, left sacral bone lesion biopsy on 09/14/22. He is an 89 yo male with PMH sig for blindness left eye, renal stones, HTN,HLD, hypothyroidism, prostate cancer, and basal cell/metastatic squamous cell skin cancers. He has poor venous access and presents today for port a cath placement to assist with treatment. He denies fever,CP,dyspnea, abd/back pain,N/V ; he does have occ cough, HA,post neck pain, bleeding from rt ear, some drainage from wound behind rt ear.    Past Medical History:  Diagnosis Date   Atypical nevus 09/07/2002   left abdomen-slight   BCC (basal cell carcinoma of skin) 07/19/2014   left sideburn- +margin-exc.   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   Nodular basal cell carcinoma (BCC) 07/16/2015   left scalp-CX35FU   Polycythemia    Prostate cancer (HCC) 1990's   total prostatectomy and adjuvant radiation. Last PSA was 0.02 in 09/2011   SCC (squamous cell carcinoma) 04/03/2009   forehead-txpbx   SCC (squamous cell carcinoma) 07/16/2015   left front scalp   SCC (squamous cell carcinoma) 07/16/2015   left front scalp-Cx35FU   SCCA (squamous cell  carcinoma) of skin 12/20/2019   Right forehead (in situ)   SCCA (squamous cell carcinoma) of skin 05/27/2020   Mid Parietal Scalp (in situ)   Superficial basal cell carcinoma (BCC) 11/21/2012   behind left ear-CX35FU, midback-CX35FU, right forehead-CX35FU-exc   Superficial basal cell carcinoma (BCC) 11/02/2017   left post neck-CX35FU   Superficial nodular basal cell carcinoma (BCC) 12/20/2019   Left temporal scalp   Superficial nodular basal cell carcinoma (BCC) 05/27/2020   Right Preauricular area (curet and 5FU)   Past Surgical History:  Procedure Laterality Date   ADJACENT TISSUE TRANSFER/TISSUE REARRANGEMENT N/A 05/22/2019   Procedure: COMPLEX CLOSURE OF NECK WOUND;  Surgeon: Allena Napoleon, MD;  Location: [facility] OR;  Service: Plastics;  Laterality: N/A;   ALLOGRAFT APPLICATION N/A 05/22/2019   Procedure: POSSIBLE FACIAL NERVE RECONSTRUCTION WITH NERVE ALLOGRAFT VS AUTOGRAFT;  Surgeon: Allena Napoleon, MD;  Location: [facility] OR;  Service: Plastics;  Laterality: N/A;   BLADDER SURGERY     due to prostatectomy.    CATARACT EXTRACTION     right   COLONOSCOPY     neg in the past; due in 2014 with [place] Dr. Jarold Motto   EYE SURGERY     50 years ago.    HERNIA REPAIR     MOHS SURGERY     NECK DISSECTION  05/22/2019    NECK DISSECTION   PAROTIDECTOMY  05/22/2019   PAROTIDECTOMY with facial nerve dissection    PAROTIDECTOMY Right  05/22/2019   Procedure: PAROTIDECTOMY;  Surgeon: Serena Colonel, MD;  Location: [facility] OR;  Service: ENT;  Laterality: Right;   PROSTATE SURGERY     RADICAL NECK DISSECTION Right 05/22/2019   Procedure: RADICAL NECK DISSECTION;  Surgeon: Serena Colonel, MD;  Location: [facility] OR;  Service: ENT;  Laterality: Right;   TONSILLECTOMY       Allergies: Methylprednisolone, Penicillins, Latex, and Tape  Medications: Prior to Admission medications   Medication Sig Start Date End Date Taking? Authorizing Provider  amLODipine (NORVASC) 5 MG tablet Take 5 mg by mouth daily.  04/17/19    [provider]  aspirin 81 MG tablet Take 81 mg by mouth daily.    [provider]  benazepril (LOTENSIN) 10 MG tablet Take 10 mg by mouth daily.    [provider]  famotidine-calcium carbonate-magnesium hydroxide (PEPCID COMPLETE) 10-800-165 MG chewable tablet Take one tablet PRN 01/04/18   [provider]  levothyroxine (SYNTHROID) 25 MCG tablet Take 25 mcg by mouth daily before breakfast.    [provider]  Multiple Vitamin (MULTIVITAMIN) tablet Take 1 tablet by mouth daily.    [provider]  ondansetron (ZOFRAN) 8 MG tablet Take 1 tablet (8 mg total) by mouth every 8 (eight) hours as needed for nausea or vomiting. 11/11/22   Jaci Standard, MD  potassium citrate (UROCIT-K) 10 MEQ (1080 MG) SR tablet Take 10 mEq by mouth 3 (three) times daily with meals.    [provider]  simvastatin (ZOCOR) 20 MG tablet Take 20 mg by mouth every evening.    [provider]  promethazine (PHENERGAN) 25 MG suppository Place 1 suppository (25 mg total) rectally every 6 (six) hours as needed for nausea or vomiting. Patient not taking: Reported on 05/31/2019 05/23/19 08/18/19  Serena Colonel, MD     Vital Signs: Vitals:   11/30/22 0917  BP: (!) 157/86  Pulse: 68  Resp: 16  Temp: 97.9 F (36.6 C)  SpO2: 97%     Code Status: FULL CODE  Physical Exam: awake/alert; chest- CTA bilat; heart- RRR; abd- soft,+BS,NT; no LE edema; blood noted in rt ear canal(not new per pt and primary team aware); bandaged wound /skin cancer behind rt ear  Imaging: No results found.  Labs:  CBC: Recent Labs    08/25/22 1127 09/14/22 0730 10/14/22 0831 11/11/22 1301  WBC 6.6 7.4 7.8 7.9  HGB 15.6 16.0 15.9 16.8  HCT 45.7 46.8 46.5 49.7  PLT 250 220 232 229    COAGS: Recent Labs    09/14/22 0730  INR 1.0    BMP: Recent Labs    08/25/22 1127 09/14/22 0730 10/14/22 0831 11/11/22 1301  NA 141 137 140 140  K 4.9 4.1 4.2 4.3  CL  107 108 104 107  CO2 30 22 30 27   GLUCOSE 91 111* 124* 91  BUN 15 22 18 17   CALCIUM 10.6* 9.2 9.9 10.3  CREATININE 1.27* 1.21 1.36* 1.22  GFRNONAA 55* 58* 50* 57*    LIVER FUNCTION TESTS: Recent Labs    02/05/22 0921 08/25/22 1127 10/14/22 0831 11/11/22 1301  BILITOT 0.5 0.7 0.6 0.6  AST 25 23 22 23   ALT 26 24 24 21   ALKPHOS 82 76 75 85  PROT 6.5 6.4* 7.0 7.4  ALBUMIN 4.3 4.1 4.3 4.5    Assessment and Plan: 89 yo male with PMH sig for blindness left eye, renal stones, HTN,HLD, hypothyroidism, prostate cancer, and basal cell/metastatic squamous cell skin cancers. He  has poor venous access and presents today for port a cath placement to assist with treatment. Risks and benefits of image guided port-a-catheter placement was discussed with the patient /son in law including, but not limited to bleeding, infection, pneumothorax, or fibrin sheath development and need for additional procedures.  All of the patient's questions were answered, patient is agreeable to proceed. Consent signed and in chart.    Electronically Signed: D. Jeananne Rama, PA-C 11/28/2022, 5:35 PM   I spent a total of 25 minutes at the the patient's bedside AND on the patient's hospital floor or unit, greater than 50% of which was counseling/coordinating care for port a cath placement

## 2022-11-30 ENCOUNTER — Ambulatory Visit (HOSPITAL_COMMUNITY)
Admission: RE | Admit: 2022-11-30 | Discharge: 2022-11-30 | Disposition: A | Discharge status: Home / Self Care | Payer: Medicare Other | Source: Ambulatory Visit | Attending: Hematology and Oncology | Admitting: Hematology and Oncology

## 2022-11-30 ENCOUNTER — Encounter (HOSPITAL_COMMUNITY): Payer: Self-pay

## 2022-11-30 ENCOUNTER — Ambulatory Visit (HOSPITAL_COMMUNITY)
Admission: RE | Admit: 2022-11-30 | Discharge: 2022-11-30 | Disposition: A | Discharge status: Home / Self Care | Payer: Medicare Other | Source: Ambulatory Visit | Attending: Hematology and Oncology

## 2022-11-30 DIAGNOSIS — C7951 Secondary malignant neoplasm of bone: Secondary | ICD-10-CM | POA: Diagnosis not present

## 2022-11-30 DIAGNOSIS — E039 Hypothyroidism, unspecified: Secondary | ICD-10-CM | POA: Diagnosis not present

## 2022-11-30 DIAGNOSIS — H5462 Unqualified visual loss, left eye, normal vision right eye: Secondary | ICD-10-CM | POA: Diagnosis not present

## 2022-11-30 DIAGNOSIS — C4492 Squamous cell carcinoma of skin, unspecified: Secondary | ICD-10-CM | POA: Diagnosis not present

## 2022-11-30 DIAGNOSIS — E785 Hyperlipidemia, unspecified: Secondary | ICD-10-CM | POA: Diagnosis not present

## 2022-11-30 DIAGNOSIS — Z8546 Personal history of malignant neoplasm of prostate: Secondary | ICD-10-CM | POA: Insufficient documentation

## 2022-11-30 DIAGNOSIS — I10 Essential (primary) hypertension: Secondary | ICD-10-CM | POA: Insufficient documentation

## 2022-11-30 HISTORY — PX: IR IMAGING GUIDED PORT INSERTION: IMG5740

## 2022-11-30 MED ORDER — LIDOCAINE-EPINEPHRINE 1 %-1:100000 IJ SOLN
INTRAMUSCULAR | Status: AC
  Filled 2022-11-30: qty 1

## 2022-11-30 MED ORDER — HEPARIN SOD (PORK) LOCK FLUSH 100 UNIT/ML IV SOLN
500.0000 [IU] | Freq: Once | INTRAVENOUS | Status: AC
  Administered 2022-11-30: 500 [IU] via INTRAVENOUS

## 2022-11-30 MED ORDER — LIDOCAINE-EPINEPHRINE 1 %-1:100000 IJ SOLN
20.0000 mL | Freq: Once | INTRAMUSCULAR | Status: AC
  Administered 2022-11-30: 20 mL via INTRADERMAL

## 2022-11-30 MED ORDER — FENTANYL CITRATE (PF) 100 MCG/2ML IJ SOLN
INTRAMUSCULAR | Status: AC
  Filled 2022-11-30: qty 2

## 2022-11-30 MED ORDER — FENTANYL CITRATE (PF) 100 MCG/2ML IJ SOLN
INTRAMUSCULAR | Status: AC | PRN
Start: 2022-11-30 — End: 2022-11-30
  Administered 2022-11-30 (×2): 50 ug via INTRAVENOUS

## 2022-11-30 MED ORDER — MIDAZOLAM HCL 2 MG/2ML IJ SOLN
INTRAMUSCULAR | Status: AC | PRN
Start: 2022-11-30 — End: 2022-11-30
  Administered 2022-11-30 (×2): 1 mg via INTRAVENOUS

## 2022-11-30 MED ORDER — HEPARIN SOD (PORK) LOCK FLUSH 100 UNIT/ML IV SOLN
INTRAVENOUS | Status: AC
  Filled 2022-11-30: qty 5

## 2022-11-30 MED ORDER — MIDAZOLAM HCL 2 MG/2ML IJ SOLN
INTRAMUSCULAR | Status: AC
  Filled 2022-11-30: qty 2

## 2022-11-30 MED ORDER — SODIUM CHLORIDE 0.9 % IV SOLN: INTRAVENOUS | Status: DC

## 2022-11-30 NOTE — Procedures (Signed)
Pre Procedure Dx: Poor venous access Post Procedural Dx: Same  Successful placement of right IJ approach port-a-cath with tip at the superior caval atrial junction. The catheter is ready for immediate use.  Estimated Blood Loss: Trace  Complications: None immediate.  Jay Haile Bosler, MD Pager #: [phone]   

## 2022-12-02 ENCOUNTER — Telehealth: Payer: Self-pay

## 2022-12-02 NOTE — Telephone Encounter (Signed)
Pt LVM regarding questions about his "port bandage."  This RN called pt back. Pt asked if he should keep is bandage on over his port until his port flush appt tomorrow or if he should remove it before his appt. This RN informed pt to keep bandage on. Pt verbalized understanding.

## 2022-12-03 ENCOUNTER — Inpatient Hospital Stay: Payer: Medicare Other

## 2022-12-03 ENCOUNTER — Other Ambulatory Visit: Payer: Self-pay

## 2022-12-03 ENCOUNTER — Inpatient Hospital Stay: Payer: Medicare Other | Admitting: Hematology and Oncology

## 2022-12-03 VITALS — BP 141/72 | HR 63 | Temp 97.7°F | Resp 13 | Wt 180.4 lb

## 2022-12-03 DIAGNOSIS — C4441 Basal cell carcinoma of skin of scalp and neck: Secondary | ICD-10-CM

## 2022-12-03 DIAGNOSIS — C4492 Squamous cell carcinoma of skin, unspecified: Secondary | ICD-10-CM

## 2022-12-03 DIAGNOSIS — C7951 Secondary malignant neoplasm of bone: Secondary | ICD-10-CM

## 2022-12-03 DIAGNOSIS — Z79620 Long term (current) use of immunosuppressive biologic: Secondary | ICD-10-CM | POA: Diagnosis not present

## 2022-12-03 DIAGNOSIS — Z5112 Encounter for antineoplastic immunotherapy: Secondary | ICD-10-CM | POA: Diagnosis not present

## 2022-12-03 LAB — CMP (CANCER CENTER ONLY)
ALT: 26 U/L (ref 0–44)
AST: 23 U/L (ref 15–41)
Albumin: 4.4 g/dL (ref 3.5–5.0)
Alkaline Phosphatase: 84 U/L (ref 38–126)
Anion gap: 7 (ref 5–15)
BUN: 16 mg/dL (ref 8–23)
CO2: 27 mmol/L (ref 22–32)
Calcium: 10.5 mg/dL — ABNORMAL HIGH (ref 8.9–10.3)
Chloride: 106 mmol/L (ref 98–111)
Creatinine: 1.17 mg/dL (ref 0.61–1.24)
GFR, Estimated: >60 mL/min (ref >60)
Glucose, Bld: 85 mg/dL (ref 70–99)
Potassium: 4.2 mmol/L (ref 3.5–5.1)
Sodium: 140 mmol/L (ref 135–145)
Total Bilirubin: 0.6 mg/dL (ref 0.3–1.2)
Total Protein: 7.1 g/dL (ref 6.5–8.1)

## 2022-12-03 LAB — CBC WITH DIFFERENTIAL (CANCER CENTER ONLY)
Abs Immature Granulocytes: 0.02 10*3/uL (ref 0.00–0.07)
Basophils Absolute: 0 10*3/uL (ref 0.0–0.1)
Basophils Relative: 0 %
Eosinophils Absolute: 0.2 10*3/uL (ref 0.0–0.5)
Eosinophils Relative: 2 %
HCT: 44 % (ref 39.0–52.0)
Hemoglobin: 15.7 g/dL (ref 13.0–17.0)
Immature Granulocytes: 0 %
Lymphocytes Relative: 19 %
Lymphs Abs: 1.4 10*3/uL (ref 0.7–4.0)
MCH: 32.4 pg (ref 26.0–34.0)
MCHC: 35.7 g/dL (ref 30.0–36.0)
MCV: 90.7 fL (ref 80.0–100.0)
Monocytes Absolute: 0.7 10*3/uL (ref 0.1–1.0)
Monocytes Relative: 9 %
Neutro Abs: 5.1 10*3/uL (ref 1.7–7.7)
Neutrophils Relative %: 70 %
Platelet Count: 228 10*3/uL (ref 150–400)
RBC: 4.85 MIL/uL (ref 4.22–5.81)
RDW: 13.5 % (ref 11.5–15.5)
WBC Count: 7.4 10*3/uL (ref 4.0–10.5)
nRBC: 0 % (ref 0.0–0.2)

## 2022-12-03 MED ORDER — SODIUM CHLORIDE 0.9 % IV SOLN: Freq: Once | INTRAVENOUS | Status: AC

## 2022-12-03 MED ORDER — SODIUM CHLORIDE 0.9 % IV SOLN
350.0000 mg | Freq: Once | INTRAVENOUS | Status: AC
  Administered 2022-12-03: 350 mg via INTRAVENOUS
  Filled 2022-12-03: qty 7

## 2022-12-03 MED ORDER — SODIUM CHLORIDE 0.9% FLUSH
10.0000 mL | Freq: Once | INTRAVENOUS | Status: AC
  Administered 2022-12-03: 10 mL via INTRAVENOUS

## 2022-12-03 MED ORDER — HEPARIN SOD (PORK) LOCK FLUSH 100 UNIT/ML IV SOLN
500.0000 [IU] | Freq: Once | INTRAVENOUS | Status: AC | PRN
  Administered 2022-12-03: 500 [IU]

## 2022-12-03 MED ORDER — SODIUM CHLORIDE 0.9% FLUSH
10.0000 mL | INTRAVENOUS | Status: DC | PRN
  Administered 2022-12-03: 10 mL

## 2022-12-03 NOTE — Patient Instructions (Signed)
[facility]  Discharge Instructions: Thank you for choosing [facility] to provide your oncology and hematology care.   If you have a lab appointment with the Cancer Center, please go directly to the Cancer Center and check in at the registration area.   Wear comfortable clothing and clothing appropriate for easy access to any Portacath or PICC line.   We strive to give you quality time with your provider. You may need to reschedule your appointment if you arrive late (15 or more minutes).  Arriving late affects you and other patients whose appointments are after yours.  Also, if you miss three or more appointments without notifying the office, you may be dismissed from the clinic at the provider's discretion.      For prescription refill requests, have your pharmacy contact our office and allow 72 hours for refills to be completed.    Today you received the following chemotherapy and/or immunotherapy agents: Libtayo.       To help prevent nausea and vomiting after your treatment, we encourage you to take your nausea medication as directed.  BELOW ARE SYMPTOMS THAT SHOULD BE REPORTED IMMEDIATELY: *FEVER GREATER THAN 100.4 F (38 C) OR HIGHER *CHILLS OR SWEATING *NAUSEA AND VOMITING THAT IS NOT CONTROLLED WITH YOUR NAUSEA MEDICATION *UNUSUAL SHORTNESS OF BREATH *UNUSUAL BRUISING OR BLEEDING *URINARY PROBLEMS (pain or burning when urinating, or frequent urination) *BOWEL PROBLEMS (unusual diarrhea, constipation, pain near the anus) TENDERNESS IN MOUTH AND THROAT WITH OR WITHOUT PRESENCE OF ULCERS (sore throat, sores in mouth, or a toothache) UNUSUAL RASH, SWELLING OR PAIN  UNUSUAL VAGINAL DISCHARGE OR ITCHING   Items with * indicate a potential emergency and should be followed up as soon as possible or go to the Emergency Department if any problems should occur.  Please show the CHEMOTHERAPY ALERT CARD or IMMUNOTHERAPY ALERT CARD at  check-in to the Emergency Department and triage nurse.  Should you have questions after your visit or need to cancel or reschedule your appointment, please contact [facility]  Dept: [phone]  and follow the prompts.  Office hours are 8:00 a.m. to 4:30 p.m. Monday - Friday. Please note that voicemails left after 4:00 p.m. may not be returned until the following business day.  We are closed weekends and major holidays. You have access to a nurse at all times for urgent questions. Please call the main number to the clinic Dept: [phone] and follow the prompts.   For any non-urgent questions, you may also contact your provider using MyChart. We now offer e-Visits for anyone 18 and older to request care online for non-urgent symptoms. For details visit [email or web address].   Also download the MyChart app! Go to the app store, search "MyChart", open the app, select [place], and log in with your MyChart username and password.   

## 2022-12-03 NOTE — Progress Notes (Signed)
[facility] Telephone:([phone]) [phone]   Fax:([phone]) [phone]  PROGRESS NOTE  Patient Care Team: Garlan Fillers, MD as PCP - General (Internal Medicine) Garlan Fillers, MD as Consulting Physician (Internal Medicine) Exie Parody, MD (Hematology and Oncology) Bjorn Pippin, MD as Attending Physician (Urology) Mardella Layman, MD as Attending Physician (Gastroenterology) Janalyn Harder, MD (Inactive) as Consulting Physician (Dermatology) Lonie Peak, MD as Consulting Physician (Radiation Oncology) Malmfelt, Lise Auer, RN as Oncology Nurse Navigator (Oncology)  Hematological/Oncological History # Bone Lesions on CT/MRI # 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 Additionally there was noted to be a new enhancing lesion at the tip of the right greater trochanter measuring up to 1.7 cm also suspicious for metastatic disease. 09/27/2020: establish care with Dr. Leonides Schanz 10/29/2020: CT biopsy performed of left iliac bone lesion. Pathology showed bone and marrow without evidence of carcinoma.  11/11/2022: Cycle 1 Day 1 of cemplimab therapy.   Interval History:  Martin Curry 89 y.o. male with medical history significant for bone lesions noted on CT/MRI who presents for a follow up visit. The patient's last visit was on 11/11/2022. In the interim since the last visit he has completed Cycle 1 of cemiplimab.   On exam today [name] is accompanied by his daughter.  He reports that he is "pleasantly surprised" by the side effects of his chemotherapy.  He reports he is having some fatigue but no nausea, vomiting, or diarrhea.  His port is in place and he notes he is not  having any difficulty with it.  He reports that his ear did not bleed as much yesterday though he has been having continuous bleeding from the ear canal.  He reports that otherwise he has not had any other side effects from his medication.  He feels well and is willing and able to proceed with immunotherapy at this time he denies any fevers, chills, sweats, nausea, vomiting or diarrhea.  His weight is been stable.  A full 10 point ROS is listed below.   MEDICAL HISTORY:  Past Medical History:  Diagnosis Date   Atypical nevus 09/07/2002   left abdomen-slight   BCC (basal cell carcinoma of skin) 07/19/2014   left sideburn- +margin-exc.   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   Nodular basal cell carcinoma (BCC) 07/16/2015   left scalp-CX35FU   Polycythemia    Prostate cancer (HCC) 1990's   total prostatectomy and adjuvant radiation. Last PSA was 0.02 in 09/2011   SCC (squamous cell carcinoma) 04/03/2009   forehead-txpbx  SCC (squamous cell carcinoma) 07/16/2015   left front scalp   SCC (squamous cell carcinoma) 07/16/2015   left front scalp-Cx35FU   SCCA (squamous cell carcinoma) of skin 12/20/2019   Right forehead (in situ)   SCCA (squamous cell carcinoma) of skin 05/27/2020   Mid Parietal Scalp (in situ)   Superficial basal cell carcinoma (BCC) 11/21/2012   behind left ear-CX35FU, midback-CX35FU, right  forehead-CX35FU-exc   Superficial basal cell carcinoma (BCC) 11/02/2017   left post neck-CX35FU   Superficial nodular basal cell carcinoma (BCC) 12/20/2019   Left temporal scalp   Superficial nodular basal cell carcinoma (BCC) 05/27/2020   Right Preauricular area (curet and 5FU)    SURGICAL HISTORY: Past Surgical History:  Procedure Laterality Date   ADJACENT TISSUE TRANSFER/TISSUE REARRANGEMENT N/A 05/22/2019   Procedure: COMPLEX CLOSURE OF NECK WOUND;  Surgeon: Allena Napoleon, MD;  Location: [facility] OR;  Service: Plastics;  Laterality: N/A;   ALLOGRAFT APPLICATION N/A 05/22/2019   Procedure: POSSIBLE FACIAL NERVE RECONSTRUCTION WITH NERVE ALLOGRAFT VS AUTOGRAFT;  Surgeon: Allena Napoleon, MD;  Location: [facility] OR;  Service: Plastics;  Laterality: N/A;   BLADDER SURGERY     due to prostatectomy.    CATARACT EXTRACTION     right   COLONOSCOPY     neg in the past; due in 2014 with [place] Dr. Jarold Motto   EYE SURGERY     50 years ago.    HERNIA REPAIR     IR IMAGING GUIDED PORT INSERTION  11/30/2022   MOHS SURGERY     NECK DISSECTION  05/22/2019    NECK DISSECTION   PAROTIDECTOMY  05/22/2019   PAROTIDECTOMY with facial nerve dissection    PAROTIDECTOMY Right 05/22/2019   Procedure: PAROTIDECTOMY;  Surgeon: Serena Colonel, MD;  Location: [facility] OR;  Service: ENT;  Laterality: Right;   PROSTATE SURGERY     RADICAL NECK DISSECTION Right 05/22/2019   Procedure: RADICAL NECK DISSECTION;  Surgeon: Serena Colonel, MD;  Location: [facility] OR;  Service: ENT;  Laterality: Right;   TONSILLECTOMY      SOCIAL HISTORY: Social History   Socioeconomic History   Marital status: Married    Spouse name: Not on file   Number of children: 3   Years of education: Not on file   Highest education level: Not on file  Occupational History    Comment: retired Scientist, 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   Occupation: retired  Tobacco Use   Smoking status: Never   Smokeless tobacco: Never  Vaping Use   Vaping status: Never Used   Substance and Sexual Activity   Alcohol use: No   Drug use: No   Sexual activity: Not on file  Other Topics Concern   Not on file  Social History Narrative   Not on file   Social Determinants of Health   Financial Resource Strain: Not on file  Food Insecurity: No Food Insecurity (10/29/2022)   Hunger Vital Sign    Worried About Running Out of Food in the Last Year: Never true    Ran Out of Food in the Last Year: Never true  Transportation Needs: No Transportation Needs (10/29/2022)   PRAPARE - Administrator, Civil Service (Medical): No    Lack of Transportation (Non-Medical): No  Physical Activity: Not on file  Stress: Not on file  Social Connections: Not on file  Intimate Partner Violence: Not At Risk (10/29/2022)   Humiliation, Afraid, Rape, and Kick questionnaire    Fear of Current or Ex-Partner: No  Emotionally Abused: No    Physically Abused: No    Sexually Abused: No    FAMILY HISTORY: Family History  Problem Relation Age of Onset   Uterine cancer Mother 35   Ovarian cancer Mother    Lung cancer Father 94   Prostate cancer Brother    Prostate cancer Brother    Colon cancer Neg Hx    Stomach cancer Neg Hx    Esophageal cancer Neg Hx     ALLERGIES:  is allergic to methylprednisolone, penicillins, latex, and tape.  MEDICATIONS:  Current Outpatient Medications  Medication Sig Dispense Refill   amLODipine (NORVASC) 5 MG tablet Take 5 mg by mouth daily.      aspirin 81 MG tablet Take 81 mg by mouth daily.     benazepril (LOTENSIN) 10 MG tablet Take 10 mg by mouth daily.     famotidine-calcium carbonate-magnesium hydroxide (PEPCID COMPLETE) 10-800-165 MG chewable tablet Take one tablet PRN     levothyroxine (SYNTHROID) 25 MCG tablet Take 25 mcg by mouth daily before breakfast.     Multiple Vitamin (MULTIVITAMIN) tablet Take 1 tablet by mouth daily.     ondansetron (ZOFRAN) 8 MG tablet Take 1 tablet (8 mg total) by mouth every 8 (eight) hours as needed  for nausea or vomiting. 20 tablet 1   potassium citrate (UROCIT-K) 10 MEQ (1080 MG) SR tablet Take 10 mEq by mouth 3 (three) times daily with meals.     simvastatin (ZOCOR) 20 MG tablet Take 20 mg by mouth every evening.     No current facility-administered medications for this visit.   Facility-Administered Medications Ordered in Other Visits  Medication Dose Route Frequency Provider Last Rate Last Admin   sodium chloride flush (NS) 0.9 % injection 10 mL  10 mL Intracatheter PRN Jaci Standard, MD   10 mL at 12/03/22 1544    REVIEW OF SYSTEMS:   Constitutional: ( - ) fevers, ( - )  chills , ( - ) night sweats Eyes: ( - ) blurriness of vision, ( - ) double vision, ( - ) watery eyes Ears, nose, mouth, throat, and face: ( - ) mucositis, ( - ) sore throat Respiratory: ( - ) cough, ( - ) dyspnea, ( - ) wheezes Cardiovascular: ( - ) palpitation, ( - ) chest discomfort, ( - ) lower extremity swelling Gastrointestinal:  ( - ) nausea, ( - ) heartburn, ( - ) change in bowel habits Skin: ( - ) abnormal skin rashes Lymphatics: ( - ) new lymphadenopathy, ( - ) easy bruising Neurological: ( - ) numbness, ( - ) tingling, ( - ) new weaknesses Behavioral/Psych: ( - ) mood change, ( - ) new changes  All other systems were reviewed with the patient and are negative.  PHYSICAL EXAMINATION: ECOG PERFORMANCE STATUS: 1 - Symptomatic but completely ambulatory  Vitals:   12/03/22 1334  BP: (!) 141/72  Pulse: 63  Resp: 13  Temp: 97.7 F (36.5 C)  SpO2: 98%     Filed Weights   12/03/22 1334  Weight: 180 lb 6.4 oz (81.8 kg)      GENERAL: Well-appearing elderly Caucasian male, alert, no distress and comfortable SKIN: skin color, texture, turgor are normal, no rashes or significant lesions EYES: conjunctiva are pink and non-injected, sclera clear LUNGS: clear to auscultation and percussion with normal breathing effort HEART: regular rate & rhythm and no murmurs and no lower extremity  edema Musculoskeletal: no cyanosis of digits and no clubbing  PSYCH: alert & oriented x 3, fluent speech NEURO: no focal motor/sensory deficits  LABORATORY DATA:  I have reviewed the data as listed    Latest Ref Rng & Units 12/03/2022   12:55 PM 11/11/2022    1:01 PM 10/14/2022    8:31 AM  CBC  WBC 4.0 - 10.5 K/uL 7.4  7.9  7.8   Hemoglobin 13.0 - 17.0 g/dL 82.9  56.2  13.0   Hematocrit 39.0 - 52.0 % 44.0  49.7  46.5   Platelets 150 - 400 K/uL 228  229  232        Latest Ref Rng & Units 12/03/2022   12:55 PM 11/11/2022    1:01 PM 10/14/2022    8:31 AM  CMP  Glucose 70 - 99 mg/dL 85  91  865   BUN 8 - 23 mg/dL 16  17  18    Creatinine 0.61 - 1.24 mg/dL 7.84  6.96  2.95   Sodium 135 - 145 mmol/L 140  140  140   Potassium 3.5 - 5.1 mmol/L 4.2  4.3  4.2   Chloride 98 - 111 mmol/L 106  107  104   CO2 22 - 32 mmol/L 27  27  30    Calcium 8.9 - 10.3 mg/dL 28.4  13.2  9.9   Total Protein 6.5 - 8.1 g/dL 7.1  7.4  7.0   Total Bilirubin 0.3 - 1.2 mg/dL 0.6  0.6  0.6   Alkaline Phos 38 - 126 U/L 84  85  75   AST 15 - 41 U/L 23  23  22    ALT 0 - 44 U/L 26  21  24      Lab Results  Component Value Date   MPROTEIN Not Observed 09/27/2020   Lab Results  Component Value Date   KPAFRELGTCHN 25.6 (H) 09/27/2020   LAMBDASER 23.0 09/27/2020   KAPLAMBRATIO 1.11 09/27/2020    RADIOGRAPHIC STUDIES: IR IMAGING GUIDED PORT INSERTION  Result Date: 11/30/2022 INDICATION: History of metastatic squamous cell carcinoma of the skin, in need of durable intravenous access for chemotherapy administration. EXAM: IMPLANTED PORT A CATH PLACEMENT WITH ULTRASOUND AND FLUOROSCOPIC GUIDANCE COMPARISON:  PET-CT-10/02/2022 MEDICATIONS: None ANESTHESIA/SEDATION: Moderate (conscious) sedation was employed during this procedure as administered by the Interventional Radiology RN. A total of Versed 2 mg and Fentanyl 100 mcg was administered intravenously. Moderate Sedation Time: 18 minutes. The patient's level of  consciousness and vital signs were monitored continuously by radiology nursing throughout the procedure under my direct supervision. CONTRAST:  None FLUOROSCOPY TIME:  31 seconds (1 mGy) COMPLICATIONS: None immediate. PROCEDURE: The procedure, risks, benefits, and alternatives were explained to the patient. Questions regarding the procedure were encouraged and answered. The patient understands and consents to the procedure. The right neck and chest were prepped with chlorhexidine in a sterile fashion, and a sterile drape was applied covering the operative field. Maximum barrier sterile technique with sterile gowns and gloves were used for the procedure. A timeout was performed prior to the initiation of the procedure. Local anesthesia was provided with 1% lidocaine with epinephrine. After creating a small venotomy incision, a micropuncture kit was utilized to access the internal jugular vein. Real-time ultrasound guidance was utilized for vascular access including the acquisition of a permanent ultrasound image documenting patency of the accessed vessel. The microwire was utilized to measure appropriate catheter length. A subcutaneous port pocket was then created along the upper chest wall utilizing a combination of sharp and blunt dissection. The pocket  was irrigated with sterile saline. A single lumen [employer] power injectable port was chosen for placement. The 8 Fr catheter was tunneled from the port pocket site to the venotomy incision. The port was placed in the pocket. The external catheter was trimmed to appropriate length. At the venotomy, an 8 Fr peel-away sheath was placed over a guidewire under fluoroscopic guidance. The catheter was then placed through the sheath and the sheath was removed. Final catheter positioning was confirmed and documented with a fluoroscopic spot radiograph. The port was accessed with a Huber needle, aspirated and flushed with heparinized saline. The venotomy site was closed  with an interrupted 4-0 Vicryl suture. The port pocket incision was closed with interrupted 2-0 Vicryl suture. Dermabond and Steri-strips were applied to both incisions. Dressings were applied. The patient tolerated the procedure well without immediate post procedural complication. FINDINGS: After catheter placement, the tip lies within the superior cavoatrial junction. The catheter aspirates and flushes normally and is ready for immediate use. IMPRESSION: Successful placement of a right internal jugular approach power injectable Port-A-Cath. The catheter is ready for immediate use. Electronically Signed   By: Simonne Come [name]   On: 11/30/2022 14:49    ASSESSMENT & PLAN SEMISI Curry 89 y.o. 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  At this time the patient's clinical findings are complicated.  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  It is unclear if these 2 processes are the same or 2 separate primaries.  Additionally there is no clear evidence of a primary squamous cell carcinoma (however my concern is that the area behind the right ear may represent the same process).  Patient also does have PET avid areas of his bowels which I do not suspect are related, but we will have evaluated further with EGD/colonoscopy as needed.  At this time the best treatment course would be to treat with Cemiplimab which should be effective at treating both basal cell carcinoma and squamous cell carcinoma, effectively treating both even if they are separate processes.  Overall he voices understanding of this complicated situation and our plan moving forward.  Of note radiation oncology and ENT are on board and willing to assist with management of his current findings.  # Metastatic Squamous Cell Cancer  to the Pelvic Bones # Basal cell carcinoma of the right head/neck --initial biopsy shows no evidence of malignancy. Previously discussed with radiology who notes a repeat biopsy was not recommended and repeat imaging would be preferred.  --negative multiple myeloma labs with SPEP and serum free light chains showing no evidence of monoclonal gammopathy.  --Given his prior history of prostate cancer we ordered a PSA, which was normal. Repeated PSA previously, found to be within normal limits --CT C/A/P shows no evidence of disease elsewhere in the body in August 2022. Most recent PET CT scan on 10/02/2022 showed hypermetabolic osseous metastasis with a dominant mass centered about the inferior medial portion of the right mastoid, suspicious for site of primary squamous cell carcinoma. -- PET CT scan shows evidence of lesion behind the right ear as well as the known pelvic metastasis. -- Reviewed tissue with pathology to determine if both lesions are the same pathology or different origins.  At this time findings are most consistent with a basal  cell primary behind his ear and squamous cell carcinoma of the pelvis.  It is possible these at the same pathology with the basal/squamous crossover, though they may represent separate primaries.  Either way Cemiplimab therapy should be adequate to treat both. -- PD-L1 testing showed 0% PD-L1 score, however the use of cemiplimab does not require PD-L1 positivity (lack of PD-L1 prevents the use of pembrolizumab)  PLAN: --Proceed with cycle 2 day 1 of Cemiplimab immunotherapy today --Labs today show white blood cell count 7.4, hemoglobin 15.7, MCV 90.7, and platelets of 228 --Will plan for a repeat  PET CT scan at the 43-month mark. (Dec 2024) -- Return to clinic for next Cycle of cemiplimab in 3 weeks time.   # Bleeding of the Ear --  secondary due to erosion of the tumor into his ear canal. -- Bleeding has been steady with no recent worsening.  Has been persistent  for months. -- Will likely be treated by immunotherapy, however if it worsens could consider palliative radiation to the area in order to stanch the bleeding.  No orders of the defined types were placed in this encounter.   All questions were answered. The patient knows to call the clinic with any problems, questions or concerns.  A total of more than 30 minutes were spent on this encounter with face-to-face time and non-face-to-face time, including preparing to see the patient, ordering tests and/or medications, counseling the patient and coordination of care as outlined above.   Ulysees Barns, MD Department of Hematology/Oncology [facility] Phone: [phone] Pager: [phone] Email: [email or web address]  12/03/2022 4:31 PM

## 2022-12-08 DIAGNOSIS — C4492 Squamous cell carcinoma of skin, unspecified: Secondary | ICD-10-CM | POA: Diagnosis not present

## 2022-12-09 DIAGNOSIS — S70361A Insect bite (nonvenomous), right thigh, initial encounter: Secondary | ICD-10-CM | POA: Diagnosis not present

## 2022-12-17 ENCOUNTER — Encounter: Payer: Self-pay | Admitting: *Deleted

## 2022-12-17 ENCOUNTER — Encounter: Payer: Self-pay | Admitting: Hematology and Oncology

## 2022-12-17 ENCOUNTER — Other Ambulatory Visit: Payer: Self-pay | Admitting: *Deleted

## 2022-12-17 MED ORDER — DOXYCYCLINE HYCLATE 100 MG PO TABS: 100.0000 mg | ORAL_TABLET | Freq: Two times a day (BID) | ORAL | 0 refills | Status: DC

## 2022-12-18 ENCOUNTER — Encounter: Payer: Self-pay | Admitting: Hematology and Oncology

## 2022-12-23 ENCOUNTER — Inpatient Hospital Stay (HOSPITAL_BASED_OUTPATIENT_CLINIC_OR_DEPARTMENT_OTHER): Payer: Medicare Other | Admitting: Hematology and Oncology

## 2022-12-23 ENCOUNTER — Other Ambulatory Visit: Payer: Medicare Other

## 2022-12-23 ENCOUNTER — Inpatient Hospital Stay: Payer: Medicare Other

## 2022-12-23 ENCOUNTER — Encounter: Payer: Self-pay | Admitting: Hematology and Oncology

## 2022-12-23 VITALS — BP 137/84 | HR 73 | Temp 97.6°F | Resp 14

## 2022-12-23 DIAGNOSIS — C7951 Secondary malignant neoplasm of bone: Secondary | ICD-10-CM

## 2022-12-23 DIAGNOSIS — Z95828 Presence of other vascular implants and grafts: Secondary | ICD-10-CM | POA: Insufficient documentation

## 2022-12-23 DIAGNOSIS — C4441 Basal cell carcinoma of skin of scalp and neck: Secondary | ICD-10-CM

## 2022-12-23 DIAGNOSIS — C4492 Squamous cell carcinoma of skin, unspecified: Secondary | ICD-10-CM | POA: Diagnosis not present

## 2022-12-23 DIAGNOSIS — Z79620 Long term (current) use of immunosuppressive biologic: Secondary | ICD-10-CM | POA: Diagnosis not present

## 2022-12-23 DIAGNOSIS — Z5112 Encounter for antineoplastic immunotherapy: Secondary | ICD-10-CM | POA: Diagnosis not present

## 2022-12-23 LAB — CBC WITH DIFFERENTIAL (CANCER CENTER ONLY)
Abs Immature Granulocytes: 0.03 10*3/uL (ref 0.00–0.07)
Basophils Absolute: 0 10*3/uL (ref 0.0–0.1)
Basophils Relative: 0 %
Eosinophils Absolute: 0.1 10*3/uL (ref 0.0–0.5)
Eosinophils Relative: 2 %
HCT: 44.9 % (ref 39.0–52.0)
Hemoglobin: 15.7 g/dL (ref 13.0–17.0)
Immature Granulocytes: 0 %
Lymphocytes Relative: 16 %
Lymphs Abs: 1.2 10*3/uL (ref 0.7–4.0)
MCH: 31.7 pg (ref 26.0–34.0)
MCHC: 35 g/dL (ref 30.0–36.0)
MCV: 90.5 fL (ref 80.0–100.0)
Monocytes Absolute: 0.6 10*3/uL (ref 0.1–1.0)
Monocytes Relative: 9 %
Neutro Abs: 5.4 10*3/uL (ref 1.7–7.7)
Neutrophils Relative %: 73 %
Platelet Count: 226 10*3/uL (ref 150–400)
RBC: 4.96 MIL/uL (ref 4.22–5.81)
RDW: 13.5 % (ref 11.5–15.5)
WBC Count: 7.4 10*3/uL (ref 4.0–10.5)
nRBC: 0 % (ref 0.0–0.2)

## 2022-12-23 LAB — TSH: TSH: 3.999 u[IU]/mL (ref 0.350–4.500)

## 2022-12-23 LAB — CMP (CANCER CENTER ONLY)
ALT: 24 U/L (ref 0–44)
AST: 20 U/L (ref 15–41)
Albumin: 4.3 g/dL (ref 3.5–5.0)
Alkaline Phosphatase: 76 U/L (ref 38–126)
Anion gap: 5 (ref 5–15)
BUN: 18 mg/dL (ref 8–23)
CO2: 27 mmol/L (ref 22–32)
Calcium: 10 mg/dL (ref 8.9–10.3)
Chloride: 108 mmol/L (ref 98–111)
Creatinine: 1.2 mg/dL (ref 0.61–1.24)
GFR, Estimated: 59 mL/min — ABNORMAL LOW (ref >60)
Glucose, Bld: 99 mg/dL (ref 70–99)
Potassium: 4 mmol/L (ref 3.5–5.1)
Sodium: 140 mmol/L (ref 135–145)
Total Bilirubin: 0.7 mg/dL (ref 0.3–1.2)
Total Protein: 6.8 g/dL (ref 6.5–8.1)

## 2022-12-23 MED ORDER — SODIUM CHLORIDE 0.9% FLUSH
10.0000 mL | INTRAVENOUS | Status: DC | PRN
  Administered 2022-12-23: 10 mL

## 2022-12-23 MED ORDER — SODIUM CHLORIDE 0.9 % IV SOLN
350.0000 mg | Freq: Once | INTRAVENOUS | Status: AC
  Administered 2022-12-23: 350 mg via INTRAVENOUS
  Filled 2022-12-23: qty 7

## 2022-12-23 MED ORDER — SODIUM CHLORIDE 0.9 % IV SOLN
Freq: Once | INTRAVENOUS | Status: AC
Start: 2022-12-23 — End: 2022-12-23

## 2022-12-23 MED ORDER — HEPARIN SOD (PORK) LOCK FLUSH 100 UNIT/ML IV SOLN
500.0000 [IU] | Freq: Once | INTRAVENOUS | Status: AC | PRN
Start: 2022-12-23 — End: 2022-12-23
  Administered 2022-12-23: 500 [IU]

## 2022-12-23 MED ORDER — LIDOCAINE-PRILOCAINE 2.5-2.5 % EX CREA: 1.0000 | TOPICAL_CREAM | CUTANEOUS | 0 refills | Status: DC | PRN

## 2022-12-23 NOTE — Progress Notes (Signed)
[facility] [phone]   [phone]  PROGRESS NOTE  Patient Care Team: Garlan Fillers, MD as PCP - General (Internal Medicine) Garlan Fillers, MD as Consulting Physician (Internal Medicine) Exie Parody, MD (Hematology and Oncology) Bjorn Pippin, MD as Attending Physician (Urology) Mardella Layman, MD as Attending Physician (Gastroenterology) Janalyn Harder, MD (Inactive) as Consulting Physician (Dermatology) Lonie Peak, MD as Consulting Physician (Radiation Oncology) Malmfelt, Lise Auer, RN as Oncology Nurse Navigator (Oncology)  Hematological/Oncological History # Bone Lesions on CT/MRI # 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 Additionally there was noted to be a new enhancing lesion at the tip of the right greater trochanter measuring up to 1.7 cm also suspicious for metastatic disease. 09/27/2020: establish care with Dr. Leonides [facility] 10/29/2020: CT biopsy performed of left iliac bone lesion. Pathology showed bone and marrow without evidence of carcinoma.  11/11/2022: Cycle 1 Day 1 of cemplimab therapy.  12/03/2022: Cycle 2 Day 1 of cemplimab therapy.   Interval History:  Martin [facility] 89 y.o. male with medical history significant for bone lesions noted on CT/MRI who presents for a follow up visit. The patient's last visit was on 12/03/2022. In the interim since the last visit he has completed Cycle 2 of cemiplimab.   On exam today Martin [facility] is accompanied by his daughter.  He reports he feels that the wound is decreasing in size and draining less.  Also he is no longer having any bleeding from his ear.  He reports that he  still occasionally bites his lip when trying to eat and he has difficulty eating foods like hamburgers.  He notes overall he is not having any major side effects as a result of his infusions.  He reports he is not very active and has not noticed a drop in energy.  He is not having any side effects such as lightheadedness, dizziness, or shortness of breath.  Overall he feels well and is willing and able to proceed with immunotherapy at this time he denies any fevers, chills, sweats, nausea, vomiting or diarrhea.  His weight is been stable.  A full 10 point ROS is listed below.   MEDICAL HISTORY:  Past Medical History:  Diagnosis Date   Atypical nevus 09/07/2002   left abdomen-slight   BCC (basal cell carcinoma of skin) 07/19/2014   left sideburn- +margin-exc.   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   Nodular basal cell carcinoma (BCC) 07/16/2015   left scalp-CX35FU   Polycythemia    Prostate cancer (HCC) 1990's  total prostatectomy and adjuvant radiation. Last PSA was 0.02 in 09/2011   SCC (squamous cell carcinoma) 04/03/2009   forehead-txpbx   SCC (squamous cell carcinoma) 07/16/2015   left front scalp   SCC (squamous cell carcinoma) 07/16/2015   left front scalp-Cx35FU   SCCA (squamous cell carcinoma) of skin 12/20/2019   Right forehead (in situ)   SCCA (squamous cell carcinoma) of skin 05/27/2020   Mid  Parietal Scalp (in situ)   Superficial basal cell carcinoma (BCC) 11/21/2012   behind left ear-CX35FU, midback-CX35FU, right forehead-CX35FU-exc   Superficial basal cell carcinoma (BCC) 11/02/2017   left post neck-CX35FU   Superficial nodular basal cell carcinoma (BCC) 12/20/2019   Left temporal scalp   Superficial nodular basal cell carcinoma (BCC) 05/27/2020   Right Preauricular area (curet and 5FU)    SURGICAL HISTORY: Past Surgical History:  Procedure Laterality Date   ADJACENT TISSUE TRANSFER/TISSUE REARRANGEMENT N/A 05/22/2019   Procedure: COMPLEX CLOSURE OF NECK WOUND;  Surgeon: Allena Napoleon, MD;  Location: [facility] OR;  Service: Plastics;  Laterality: N/A;   ALLOGRAFT APPLICATION N/A 05/22/2019   Procedure: POSSIBLE FACIAL NERVE RECONSTRUCTION WITH NERVE ALLOGRAFT VS AUTOGRAFT;  Surgeon: Allena Napoleon, MD;  Location: [facility] OR;  Service: Plastics;  Laterality: N/A;   BLADDER SURGERY     due to prostatectomy.    CATARACT EXTRACTION     right   COLONOSCOPY     neg in the past; due in 2014 with [place] Dr. Jarold Motto   EYE SURGERY     50 years ago.    HERNIA REPAIR     IR IMAGING GUIDED PORT INSERTION  11/30/2022   MOHS SURGERY     NECK DISSECTION  05/22/2019    NECK DISSECTION   PAROTIDECTOMY  05/22/2019   PAROTIDECTOMY with facial nerve dissection    PAROTIDECTOMY Right 05/22/2019   Procedure: PAROTIDECTOMY;  Surgeon: Serena Colonel, MD;  Location: [facility];  Service: ENT;  Laterality: Right;   PROSTATE SURGERY     RADICAL NECK DISSECTION Right 05/22/2019   Procedure: RADICAL NECK DISSECTION;  Surgeon: Serena Colonel, MD;  Location: [facility];  Service: ENT;  Laterality: Right;   TONSILLECTOMY      SOCIAL HISTORY: Social History   Socioeconomic History   Marital status: Married    Spouse name: Not on file   Number of children: 3   Years of education: Not on file   Highest education level: Not on file  Occupational History    Comment: retired Scientist, [employer]; 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    Occupation: retired  Tobacco Use   Smoking status: Never   Smokeless tobacco: Never  Vaping Use   Vaping status: Never Used  Substance and Sexual Activity   Alcohol use: No   Drug use: No   Sexual activity: Not on file  Other Topics Concern   Not on file  Social History Narrative   Not on file   Social Determinants of Health   Financial Resource Strain: Not on file  Food Insecurity: No Food Insecurity (10/29/2022)   Hunger Vital Sign    Worried About Running Out of Food in the Last Year: Never true    Ran Out of Food in the Last Year: Never true  Transportation Needs: No Transportation Needs (10/29/2022)   PRAPARE - Administrator, Civil Service (Medical): No    Lack of Transportation (Non-Medical): No  Physical Activity: Not on file  Stress: Not on file  Social Connections: Not on file  Intimate  Partner Violence: Not At Risk (10/29/2022)   Humiliation, Afraid, Rape, and Kick questionnaire    Fear of Current or Ex-Partner: No    Emotionally Abused: No    Physically Abused: No    Sexually Abused: No    FAMILY HISTORY: Family History  Problem Relation Age of Onset   Uterine cancer Mother 40   Ovarian cancer Mother    Lung cancer Father 96   Prostate cancer Brother    Prostate cancer Brother    Colon cancer Neg Hx    Stomach cancer Neg Hx    Esophageal cancer Neg Hx     ALLERGIES:  is allergic to methylprednisolone, penicillins, latex, and tape.  MEDICATIONS:  Current Outpatient Medications  Medication Sig Dispense Refill   lidocaine-prilocaine (EMLA) cream Apply 1 Application topically as needed. 30 g 0   amLODipine (NORVASC) 5 MG tablet Take 5 mg by mouth daily.      aspirin 81 MG tablet Take 81 mg by mouth daily.     benazepril (LOTENSIN) 10 MG tablet Take 10 mg by mouth daily.     doxycycline (VIBRA-TABS) 100 MG tablet Take 1 tablet (100 mg total) by mouth 2 (two) times daily. 28 tablet 0   famotidine-calcium carbonate-magnesium hydroxide (PEPCID  COMPLETE) 10-800-165 MG chewable tablet Take one tablet PRN     levothyroxine (SYNTHROID) 25 MCG tablet Take 25 mcg by mouth daily before breakfast.     Multiple Vitamin (MULTIVITAMIN) tablet Take 1 tablet by mouth daily.     ondansetron (ZOFRAN) 8 MG tablet Take 1 tablet (8 mg total) by mouth every 8 (eight) hours as needed for nausea or vomiting. 20 tablet 1   potassium citrate (UROCIT-K) 10 MEQ (1080 MG) SR tablet Take 10 mEq by mouth 3 (three) times daily with meals.     simvastatin (ZOCOR) 20 MG tablet Take 20 mg by mouth every evening.     No current facility-administered medications for this visit.   Facility-Administered Medications Ordered in Other Visits  Medication Dose Route Frequency Provider Last Rate Last Admin   sodium chloride flush (NS) 0.9 % injection 10 mL  10 mL Intracatheter PRN Jaci Standard, MD   10 mL at 12/23/22 1353    REVIEW OF SYSTEMS:   Constitutional: ( - ) fevers, ( - )  chills , ( - ) night sweats Eyes: ( - ) blurriness of vision, ( - ) double vision, ( - ) watery eyes Ears, nose, mouth, throat, and face: ( - ) mucositis, ( - ) sore throat Respiratory: ( - ) cough, ( - ) dyspnea, ( - ) wheezes Cardiovascular: ( - ) palpitation, ( - ) chest discomfort, ( - ) lower extremity swelling Gastrointestinal:  ( - ) nausea, ( - ) heartburn, ( - ) change in bowel habits Skin: ( - ) abnormal skin rashes Lymphatics: ( - ) new lymphadenopathy, ( - ) easy bruising Neurological: ( - ) numbness, ( - ) tingling, ( - ) new weaknesses Behavioral/Psych: ( - ) mood change, ( - ) new changes  All other systems were reviewed with the patient and are negative.  PHYSICAL EXAMINATION: ECOG PERFORMANCE STATUS: 1 - Symptomatic but completely ambulatory  Vitals:   12/23/22 1031  BP: 137/84  Pulse: 73  Resp: 14  Temp: 97.6 F (36.4 C)  SpO2: 96%   There were no vitals filed for this visit.  GENERAL: Well-appearing elderly Caucasian male, alert, no distress and  comfortable SKIN: skin color,  texture, turgor are normal, no rashes or significant lesions EYES: conjunctiva are pink and non-injected, sclera clear LUNGS: clear to auscultation and percussion with normal breathing effort HEART: regular rate & rhythm and no murmurs and no lower extremity edema Musculoskeletal: no cyanosis of digits and no clubbing  PSYCH: alert & oriented x 3, fluent speech NEURO: no focal motor/sensory deficits  LABORATORY DATA:  I have reviewed the data as listed    Latest Ref Rng & Units 12/23/2022    9:58 AM 12/03/2022   12:55 PM 11/11/2022    1:01 PM  CBC  WBC 4.0 - 10.5 K/uL 7.4  7.4  7.9   Hemoglobin 13.0 - 17.0 g/dL 21.3  08.6  57.8   Hematocrit 39.0 - 52.0 % 44.9  44.0  49.7   Platelets 150 - 400 K/uL 226  228  229        Latest Ref Rng & Units 12/23/2022    9:58 AM 12/03/2022   12:55 PM 11/11/2022    1:01 PM  CMP  Glucose 70 - 99 mg/dL 99  85  91   BUN 8 - 23 mg/dL 18  16  17    Creatinine 0.61 - 1.24 mg/dL 4.69  6.29  5.28   Sodium 135 - 145 mmol/L 140  140  140   Potassium 3.5 - 5.1 mmol/L 4.0  4.2  4.3   Chloride 98 - 111 mmol/L 108  106  107   CO2 22 - 32 mmol/L 27  27  27    Calcium 8.9 - 10.3 mg/dL 41.3  24.4  01.0   Total Protein 6.5 - 8.1 g/dL 6.8  7.1  7.4   Total Bilirubin 0.3 - 1.2 mg/dL 0.7  0.6  0.6   Alkaline Phos 38 - 126 U/L 76  84  85   AST 15 - 41 U/L 20  23  23    ALT 0 - 44 U/L 24  26  21      Lab Results  Component Value Date   MPROTEIN Not Observed 09/27/2020   Lab Results  Component Value Date   KPAFRELGTCHN 25.6 (H) 09/27/2020   LAMBDASER 23.0 09/27/2020   KAPLAMBRATIO 1.11 09/27/2020    RADIOGRAPHIC STUDIES: IR IMAGING GUIDED PORT INSERTION  Result Date: 11/30/2022 INDICATION: History of metastatic squamous cell carcinoma of the skin, in need of durable intravenous access for chemotherapy administration. EXAM: IMPLANTED PORT A CATH PLACEMENT WITH ULTRASOUND AND FLUOROSCOPIC GUIDANCE COMPARISON:  PET-CT-10/02/2022  MEDICATIONS: None ANESTHESIA/SEDATION: Moderate (conscious) sedation was employed during this procedure as administered by the Interventional Radiology RN. A total of Versed 2 mg and Fentanyl 100 mcg was administered intravenously. Moderate Sedation Time: 18 minutes. The patient's level of consciousness and vital signs were monitored continuously by radiology nursing throughout the procedure under my direct supervision. CONTRAST:  None FLUOROSCOPY TIME:  31 seconds (1 mGy) COMPLICATIONS: None immediate. PROCEDURE: The procedure, risks, benefits, and alternatives were explained to the patient. Questions regarding the procedure were encouraged and answered. The patient understands and consents to the procedure. The right neck and chest were prepped with chlorhexidine in a sterile fashion, and a sterile drape was applied covering the operative field. Maximum barrier sterile technique with sterile gowns and gloves were used for the procedure. A timeout was performed prior to the initiation of the procedure. Local anesthesia was provided with 1% lidocaine with epinephrine. After creating a small venotomy incision, a micropuncture kit was utilized to access the internal jugular vein. Real-time ultrasound guidance was  utilized for vascular access including the acquisition of a permanent ultrasound image documenting patency of the accessed vessel. The microwire was utilized to measure appropriate catheter length. A subcutaneous port pocket was then created along the upper chest wall utilizing a combination of sharp and blunt dissection. The pocket was irrigated with sterile saline. A single lumen [employer] power injectable port was chosen for placement. The 8 Fr catheter was tunneled from the port pocket site to the venotomy incision. The port was placed in the pocket. The external catheter was trimmed to appropriate length. At the venotomy, an 8 Fr peel-away sheath was placed over a guidewire under fluoroscopic  guidance. The catheter was then placed through the sheath and the sheath was removed. Final catheter positioning was confirmed and documented with a fluoroscopic spot radiograph. The port was accessed with a Huber needle, aspirated and flushed with heparinized saline. The venotomy site was closed with an interrupted 4-0 Vicryl suture. The port pocket incision was closed with interrupted 2-0 Vicryl suture. Dermabond and Steri-strips were applied to both incisions. Dressings were applied. The patient tolerated the procedure well without immediate post procedural complication. FINDINGS: After catheter placement, the tip lies within the superior cavoatrial junction. The catheter aspirates and flushes normally and is ready for immediate use. IMPRESSION: Successful placement of a right internal jugular approach power injectable Port-A-Cath. The catheter is ready for immediate use. Electronically Signed   By: Simonne Come [name]   On: 11/30/2022 14:49    ASSESSMENT & PLAN ANDRAS FARRUGIA 89 y.o. 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  At this time the patient's clinical findings are complicated.  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  It is unclear if these 2 processes are the same or 2 separate primaries.  Additionally there is no clear evidence of a primary squamous cell carcinoma (however my concern is that the area behind the right ear may represent the same process).  Patient also does have PET avid areas of his bowels which I do not suspect are related, but we will have evaluated further with EGD/colonoscopy as needed.  At this time the best treatment course would be to treat with Cemiplimab which should be effective at treating both basal cell carcinoma and squamous cell carcinoma,  effectively treating both even if they are separate processes.  Overall he voices understanding of this complicated situation and our plan moving forward.  Of note radiation oncology and ENT are on board and willing to assist with management of his current findings.  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 Previously discussed with radiology who notes a repeat biopsy was not recommended and repeat imaging would be preferred.  --negative multiple myeloma labs with SPEP and serum free light chains showing no evidence of monoclonal gammopathy.  --Given his prior history of prostate cancer we ordered a PSA, which was normal. Repeated PSA previously, found to be within normal limits --CT C/A/P shows no evidence of disease elsewhere in the body in August 2022. Most recent PET CT scan on 10/02/2022 showed hypermetabolic osseous metastasis with a dominant mass centered about the inferior medial portion of the right mastoid, suspicious for site of primary squamous  cell carcinoma. -- PET CT scan shows evidence of lesion behind the right ear as well as the known pelvic metastasis. -- Reviewed tissue with pathology to determine if both lesions are the same pathology or different origins.  At this time findings are most consistent with a basal cell primary behind his ear and squamous cell carcinoma of the pelvis.  It is possible these at the same pathology with the basal/squamous crossover, though they may represent separate primaries.  Either way Cemiplimab therapy should be adequate to treat both. -- PD-L1 testing showed 0% PD-L1 score, however the use of cemiplimab does not require PD-L1 positivity (lack of PD-L1 prevents the use of pembrolizumab)  PLAN: --Proceed with cycle 3 day 1 of Cemiplimab immunotherapy today --Labs today show white blood cell count 7.4, hemoglobin 15.7, MCV 90.5, platelets  226 --Will plan for a repeat  PET CT scan at the 58-month mark. (Dec 2024) -- Return to clinic for next Cycle of cemiplimab in 3 weeks time.   # Bleeding of the Ear--improved --  secondary due to erosion of the tumor into his ear canal. -- Bleeding has been improving with treatment. -- Will likely be treated by immunotherapy, however if it worsens could consider palliative radiation to the area in order to stanch the bleeding.  Orders Placed This Encounter  Procedures   NM PET Image Restag (PS) Skull Base To Thigh    Standing Status:   Future    Standing Expiration Date:   12/23/2023    Order Specific Question:   If indicated for the ordered procedure, I authorize the administration of a radiopharmaceutical per Radiology protocol    Answer:   Yes    Order Specific Question:   Preferred imaging location?    Answer:   [facility]    All questions were answered. The patient knows to call the clinic with any problems, questions or concerns.  A total of more than 30 minutes were spent on this encounter with face-to-face time and non-face-to-face time, including preparing to see the patient, ordering tests and/or medications, counseling the patient and coordination of care as outlined above.   Martin Barns, MD Department of Hematology/Oncology [facility] Phone: [phone] Pager: [phone] Email: [email or web address]  12/23/2022 4:34 PM

## 2022-12-23 NOTE — Patient Instructions (Signed)
[facility]  Discharge Instructions: Thank you for choosing [facility] to provide your oncology and hematology care.   If you have a lab appointment with the Cancer Center, please go directly to the Cancer Center and check in at the registration area.   Wear comfortable clothing and clothing appropriate for easy access to any Portacath or PICC line.   We strive to give you quality time with your provider. You may need to reschedule your appointment if you arrive late (15 or more minutes).  Arriving late affects you and other patients whose appointments are after yours.  Also, if you miss three or more appointments without notifying the office, you may be dismissed from the clinic at the provider's discretion.      For prescription refill requests, have your pharmacy contact our office and allow 72 hours for refills to be completed.    Today you received the following chemotherapy and/or immunotherapy agents: Libtayo.       To help prevent nausea and vomiting after your treatment, we encourage you to take your nausea medication as directed.  BELOW ARE SYMPTOMS THAT SHOULD BE REPORTED IMMEDIATELY: *FEVER GREATER THAN 100.4 F (38 C) OR HIGHER *CHILLS OR SWEATING *NAUSEA AND VOMITING THAT IS NOT CONTROLLED WITH YOUR NAUSEA MEDICATION *UNUSUAL SHORTNESS OF BREATH *UNUSUAL BRUISING OR BLEEDING *URINARY PROBLEMS (pain or burning when urinating, or frequent urination) *BOWEL PROBLEMS (unusual diarrhea, constipation, pain near the anus) TENDERNESS IN MOUTH AND THROAT WITH OR WITHOUT PRESENCE OF ULCERS (sore throat, sores in mouth, or a toothache) UNUSUAL RASH, SWELLING OR PAIN  UNUSUAL VAGINAL DISCHARGE OR ITCHING   Items with * indicate a potential emergency and should be followed up as soon as possible or go to the Emergency Department if any problems should occur.  Please show the CHEMOTHERAPY ALERT CARD or IMMUNOTHERAPY ALERT CARD at  check-in to the Emergency Department and triage nurse.  Should you have questions after your visit or need to cancel or reschedule your appointment, please contact [facility]  Dept: [phone]  and follow the prompts.  Office hours are 8:00 a.m. to 4:30 p.m. Monday - Friday. Please note that voicemails left after 4:00 p.m. may not be returned until the following business day.  We are closed weekends and major holidays. You have access to a nurse at all times for urgent questions. Please call the main number to the clinic Dept: [phone] and follow the prompts.   For any non-urgent questions, you may also contact your provider using MyChart. We now offer e-Visits for anyone 18 and older to request care online for non-urgent symptoms. For details visit [email or web address].   Also download the MyChart app! Go to the app store, search "MyChart", open the app, select [place], and log in with your MyChart username and password.   

## 2022-12-24 ENCOUNTER — Encounter: Payer: Self-pay | Admitting: Hematology and Oncology

## 2022-12-25 LAB — T4: T4, Total: 8.9 ug/dL (ref 4.5–12.0)

## 2023-01-10 NOTE — Assessment & Plan Note (Addendum)
Basal cell carcinoma of the right head/neck --initial biopsy shows no evidence of malignancy. Previously discussed with radiology who notes a repeat biopsy was not recommended and repeat imaging would be preferred.  --negative multiple myeloma labs with SPEP and serum free light chains showing no evidence of monoclonal gammopathy.  --Given his prior history of prostate cancer we ordered a PSA, which was normal. Repeated PSA previously, found to be within normal limits --CT C/A/P shows no evidence of disease elsewhere in the body in August 2022. Most recent PET CT scan on 10/02/2022 showed hypermetabolic osseous metastasis with a dominant mass centered about the inferior medial portion of the right mastoid, suspicious for site of primary squamous cell carcinoma. -- PET CT scan shows evidence of lesion behind the right ear as well as the known pelvic metastasis. -- Reviewed tissue with pathology to determine if both lesions are the same pathology or different origins.  At this time findings are most consistent with a basal cell primary behind his ear and squamous cell carcinoma of the pelvis.  It is possible these at the same pathology with the basal/squamous crossover, though they may represent separate primaries.  Either way Cemiplimab therapy should be adequate to treat both. -- PD-L1 testing showed 0% PD-L1 score, however the use of cemiplimab does not require PD-L1 positivity (lack of PD-L1 prevents the use of pembrolizumab)  PLAN: --01/13/2023 - Proceed with cycle 4 day 1 of Cemiplimab immunotherapy today --Labs today show white blood cell count 7.4, hemoglobin 15.7, MCV 90.5, platelets 226 --PET CT scheduled for 01/25/2023 -- Return to clinic for next Cycle of cemiplimab in 3 weeks time.

## 2023-01-10 NOTE — Progress Notes (Unsigned)
Patient Care Team: Garlan Fillers, MD as PCP - General (Internal Medicine) Garlan Fillers, MD as Consulting Physician (Internal Medicine) Exie Parody, MD (Hematology and Oncology) Bjorn Pippin, MD as Attending Physician (Urology) Mardella Layman, MD as Attending Physician (Gastroenterology) Janalyn Harder, MD (Inactive) as Consulting Physician (Dermatology) Lonie Peak, MD as Consulting Physician (Radiation Oncology) Malmfelt, Lise Auer, RN as Oncology Nurse Navigator (Oncology)  Clinic Day:  01/10/2023  Referring physician: Garlan Fillers, MD  ASSESSMENT & PLAN:   Assessment & Plan: Basal cell carcinoma (BCC) of skin of neck Basal cell carcinoma of the right head/neck --initial biopsy shows no evidence of malignancy. Previously discussed with radiology who notes a repeat biopsy was not recommended and repeat imaging would be preferred.  --negative multiple myeloma labs with SPEP and serum free light chains showing no evidence of monoclonal gammopathy.  --Given his prior history of prostate cancer we ordered a PSA, which was normal. Repeated PSA previously, found to be within normal limits --CT C/A/P shows no evidence of disease elsewhere in the body in August 2022. Most recent PET CT scan on 10/02/2022 showed hypermetabolic osseous metastasis with a dominant mass centered about the inferior medial portion of the right mastoid, suspicious for site of primary squamous cell carcinoma. -- PET CT scan shows evidence of lesion behind the right ear as well as the known pelvic metastasis. -- Reviewed tissue with pathology to determine if both lesions are the same pathology or different origins.  At this time findings are most consistent with a basal cell primary behind his ear and squamous cell carcinoma of the pelvis.  It is possible these at the same pathology with the basal/squamous crossover, though they may represent separate primaries.  Either way Cemiplimab therapy should be  adequate to treat both. -- PD-L1 testing showed 0% PD-L1 score, however the use of cemiplimab does not require PD-L1 positivity (lack of PD-L1 prevents the use of pembrolizumab)  PLAN: --01/13/2023 - Proceed with cycle 4 day 1 of Cemiplimab immunotherapy today --Labs today show white blood cell count 7.4, hemoglobin 15.7, MCV 90.5, platelets 226 --PET CT scheduled for 01/25/2023 -- Return to clinic for next Cycle of cemiplimab in 3 weeks time.     The patient understands the plans discussed today and is in agreement with them.  He knows to contact our office if he develops concerns prior to his next appointment.  I provided *** minutes of face-to-face time during this encounter and > 50% was spent counseling as documented under my assessment and plan.    Carlean Jews, NP  [facility] - [facility] [address] [place] [address] Dept: [phone] Dept Fax: [phone]   No orders of the defined types were placed in this encounter.     CHIEF COMPLAINT:  CC: basal cell carcinoma of skin of right head/neck  Current Treatment:  cemiplimab every 21 days  INTERVAL HISTORY:  Martin Curry is here today for repeat clinical assessment. Last saaw Dr. Leonides Schanz 12/23/2022. Today, presents  for Cycle 4 day 1 cemiplimab.  He denies fevers or chills. He denies pain. His appetite is good. His weight {Weight change:10426}.  I have reviewed the past medical history, past surgical history, social history and family history with the patient and they are unchanged from previous note.  ALLERGIES:  is allergic to methylprednisolone, penicillins, latex, and tape.  MEDICATIONS:  Current Outpatient Medications  Medication Sig  Dispense Refill   amLODipine (NORVASC) 5 MG tablet Take 5 mg by mouth daily.      aspirin 81 MG tablet Take 81 mg by mouth daily.     benazepril (LOTENSIN) 10 MG tablet Take 10 mg by mouth daily.      doxycycline (VIBRA-TABS) 100 MG tablet Take 1 tablet (100 mg total) by mouth 2 (two) times daily. 28 tablet 0   famotidine-calcium carbonate-magnesium hydroxide (PEPCID COMPLETE) 10-800-165 MG chewable tablet Take one tablet PRN     levothyroxine (SYNTHROID) 25 MCG tablet Take 25 mcg by mouth daily before breakfast.     lidocaine-prilocaine (EMLA) cream Apply 1 Application topically as needed. 30 g 0   Multiple Vitamin (MULTIVITAMIN) tablet Take 1 tablet by mouth daily.     ondansetron (ZOFRAN) 8 MG tablet Take 1 tablet (8 mg total) by mouth every 8 (eight) hours as needed for nausea or vomiting. 20 tablet 1   potassium citrate (UROCIT-K) 10 MEQ (1080 MG) SR tablet Take 10 mEq by mouth 3 (three) times daily with meals.     simvastatin (ZOCOR) 20 MG tablet Take 20 mg by mouth every evening.     No current facility-administered medications for this visit.    HISTORY OF PRESENT ILLNESS:   Oncology History  Basal cell carcinoma (BCC) of skin of neck  05/02/2019 Cancer Staging   Staging form: Cutaneous Carcinoma of the Head and Neck, AJCC 8th Edition - Clinical stage from 05/02/2019: Stage III (cT3, cN0, cM0) - Signed by Lonie Peak, MD on 05/03/2019   05/03/2019 Initial Diagnosis   Basal cell carcinoma (BCC) of skin of neck   06/16/2019 Cancer Staging   Staging form: Cutaneous Carcinoma of the Head and Neck, AJCC 8th Edition - Pathologic stage from 06/16/2019: Stage III (pT3, pN1, cM0) - Signed by Lonie Peak, MD on 06/20/2019   11/11/2022 -  Chemotherapy   Patient is on Treatment Plan : ADVANCED CUTANEOUS SQUAMOUS CELL CARCINOMA Cemiplimab q21d     Metastasis to bone (HCC)  10/30/2022 Initial Diagnosis   Metastasis to bone (HCC)   11/11/2022 -  Chemotherapy   Patient is on Treatment Plan : ADVANCED CUTANEOUS SQUAMOUS CELL CARCINOMA Cemiplimab q21d     Squamous cell carcinoma of skin  11/03/2022 Initial Diagnosis   Squamous cell carcinoma of skin   11/11/2022 -  Chemotherapy   Patient is  on Treatment Plan : ADVANCED CUTANEOUS SQUAMOUS CELL CARCINOMA Cemiplimab q21d         REVIEW OF SYSTEMS:   Constitutional: Denies fevers, chills or abnormal weight loss Eyes: Denies blurriness of vision Ears, nose, mouth, throat, and face: Denies mucositis or sore throat Respiratory: Denies cough, dyspnea or wheezes Cardiovascular: Denies palpitation, chest discomfort or lower extremity swelling Gastrointestinal:  Denies nausea, heartburn or change in bowel habits Skin: Denies abnormal skin rashes Lymphatics: Denies new lymphadenopathy or easy bruising Neurological:Denies numbness, tingling or new weaknesses Behavioral/Psych: Mood is stable, no new changes  All other systems were reviewed with the patient and are negative.   VITALS:  There were no vitals taken for this visit.  Wt Readings from Last 3 Encounters:  12/03/22 180 lb 6.4 oz (81.8 kg)  11/27/22 181 lb 4.8 oz (82.2 kg)  11/11/22 179 lb 9.6 oz (81.5 kg)    There is no height or weight on file to calculate BMI.  Performance status (ECOG): {CHL ONC [id number]  PHYSICAL EXAM:   GENERAL:alert, no distress and comfortable SKIN: skin color, texture, turgor are normal, no  rashes or significant lesions EYES: normal, Conjunctiva are pink and non-injected, sclera clear OROPHARYNX:no exudate, no erythema and lips, buccal mucosa, and tongue normal  NECK: supple, thyroid normal size, non-tender, without nodularity LYMPH:  no palpable lymphadenopathy in the cervical, axillary or inguinal LUNGS: clear to auscultation and percussion with normal breathing effort HEART: regular rate & rhythm and no murmurs and no lower extremity edema ABDOMEN:abdomen soft, non-tender and normal bowel sounds Musculoskeletal:no cyanosis of digits and no clubbing  NEURO: alert & oriented x 3 with fluent speech, no focal motor/sensory deficits  LABORATORY DATA:  I have reviewed the data as listed    Component Value Date/Time   NA 140  12/23/2022 0958   NA 141 11/18/2011 1046   K 4.0 12/23/2022 0958   K 4.3 11/18/2011 1046   CL 108 12/23/2022 0958   CL 109 (H) 11/18/2011 1046   CO2 27 12/23/2022 0958   CO2 25 11/18/2011 1046   GLUCOSE 99 12/23/2022 0958   GLUCOSE 89 11/18/2011 1046   BUN 18 12/23/2022 0958   BUN 17.0 11/18/2011 1046   CREATININE 1.20 12/23/2022 0958   CREATININE 1.3 11/18/2011 1046   CALCIUM 10.0 12/23/2022 0958   CALCIUM 10.1 11/18/2011 1046   PROT 6.8 12/23/2022 0958   PROT 6.8 11/18/2011 1046   ALBUMIN 4.3 12/23/2022 0958   ALBUMIN 4.1 11/18/2011 1046   AST 20 12/23/2022 0958   AST 32 11/18/2011 1046   ALT 24 12/23/2022 0958   ALT 48 11/18/2011 1046   ALKPHOS 76 12/23/2022 0958   ALKPHOS 79 11/18/2011 1046   BILITOT 0.7 12/23/2022 0958   BILITOT 0.70 11/18/2011 1046   GFRNONAA 59 (L) 12/23/2022 0958   GFRAA 51 (L) 10/12/2019 1243    Lab Results  Component Value Date   WBC 7.4 12/23/2022   NEUTROABS 5.4 12/23/2022   HGB 15.7 12/23/2022   HCT 44.9 12/23/2022   MCV 90.5 12/23/2022   PLT 226 12/23/2022       RADIOGRAPHIC STUDIES: I have personally reviewed the radiological images as listed and agreed with the findings in the report. No results found.

## 2023-01-13 ENCOUNTER — Other Ambulatory Visit: Payer: Medicare Other

## 2023-01-13 ENCOUNTER — Inpatient Hospital Stay: Payer: Medicare Other | Attending: Hematology and Oncology

## 2023-01-13 ENCOUNTER — Inpatient Hospital Stay: Payer: Medicare Other

## 2023-01-13 ENCOUNTER — Inpatient Hospital Stay: Payer: Medicare Other | Admitting: Hematology and Oncology

## 2023-01-13 VITALS — BP 131/73 | HR 69 | Temp 97.3°F | Resp 13 | Wt 182.2 lb

## 2023-01-13 VITALS — BP 137/66 | HR 74 | Temp 97.9°F | Resp 18

## 2023-01-13 DIAGNOSIS — C7951 Secondary malignant neoplasm of bone: Secondary | ICD-10-CM | POA: Diagnosis not present

## 2023-01-13 DIAGNOSIS — C4492 Squamous cell carcinoma of skin, unspecified: Secondary | ICD-10-CM

## 2023-01-13 DIAGNOSIS — C4441 Basal cell carcinoma of skin of scalp and neck: Secondary | ICD-10-CM | POA: Diagnosis not present

## 2023-01-13 DIAGNOSIS — Z5112 Encounter for antineoplastic immunotherapy: Secondary | ICD-10-CM | POA: Diagnosis not present

## 2023-01-13 DIAGNOSIS — Z79620 Long term (current) use of immunosuppressive biologic: Secondary | ICD-10-CM | POA: Insufficient documentation

## 2023-01-13 DIAGNOSIS — Z95828 Presence of other vascular implants and grafts: Secondary | ICD-10-CM

## 2023-01-13 LAB — CBC WITH DIFFERENTIAL (CANCER CENTER ONLY)
Abs Immature Granulocytes: 0.04 10*3/uL (ref 0.00–0.07)
Basophils Absolute: 0 10*3/uL (ref 0.0–0.1)
Basophils Relative: 1 %
Eosinophils Absolute: 0.1 10*3/uL (ref 0.0–0.5)
Eosinophils Relative: 2 %
HCT: 44.1 % (ref 39.0–52.0)
Hemoglobin: 15.3 g/dL (ref 13.0–17.0)
Immature Granulocytes: 1 %
Lymphocytes Relative: 15 %
Lymphs Abs: 1.1 10*3/uL (ref 0.7–4.0)
MCH: 31.7 pg (ref 26.0–34.0)
MCHC: 34.7 g/dL (ref 30.0–36.0)
MCV: 91.5 fL (ref 80.0–100.0)
Monocytes Absolute: 0.6 10*3/uL (ref 0.1–1.0)
Monocytes Relative: 8 %
Neutro Abs: 5.7 10*3/uL (ref 1.7–7.7)
Neutrophils Relative %: 73 %
Platelet Count: 220 10*3/uL (ref 150–400)
RBC: 4.82 MIL/uL (ref 4.22–5.81)
RDW: 13.3 % (ref 11.5–15.5)
WBC Count: 7.6 10*3/uL (ref 4.0–10.5)
nRBC: 0 % (ref 0.0–0.2)

## 2023-01-13 LAB — CMP (CANCER CENTER ONLY)
ALT: 21 U/L (ref 0–44)
AST: 20 U/L (ref 15–41)
Albumin: 4.2 g/dL (ref 3.5–5.0)
Alkaline Phosphatase: 77 U/L (ref 38–126)
Anion gap: 6 (ref 5–15)
BUN: 20 mg/dL (ref 8–23)
CO2: 26 mmol/L (ref 22–32)
Calcium: 10.3 mg/dL (ref 8.9–10.3)
Chloride: 108 mmol/L (ref 98–111)
Creatinine: 1.14 mg/dL (ref 0.61–1.24)
GFR, Estimated: >60 mL/min (ref >60)
Glucose, Bld: 96 mg/dL (ref 70–99)
Potassium: 4.3 mmol/L (ref 3.5–5.1)
Sodium: 140 mmol/L (ref 135–145)
Total Bilirubin: 0.6 mg/dL (ref <1.2)
Total Protein: 6.8 g/dL (ref 6.5–8.1)

## 2023-01-13 MED ORDER — SODIUM CHLORIDE 0.9 % IV SOLN
350.0000 mg | Freq: Once | INTRAVENOUS | Status: AC
  Administered 2023-01-13: 350 mg via INTRAVENOUS
  Filled 2023-01-13: qty 7

## 2023-01-13 MED ORDER — SODIUM CHLORIDE 0.9 % IV SOLN: Freq: Once | INTRAVENOUS | Status: AC

## 2023-01-13 MED ORDER — SODIUM CHLORIDE 0.9% FLUSH
10.0000 mL | INTRAVENOUS | Status: DC | PRN
  Administered 2023-01-13: 10 mL

## 2023-01-13 NOTE — Patient Instructions (Signed)
 [facility] - A DEPT OF [facility] [address] [facility]  Discharge Instructions: Thank you for choosing [facility] to provide your oncology and hematology care.   If you have a lab appointment with the Cancer Center, please go directly to the Cancer Center and check in at the registration area.   Wear comfortable clothing and clothing appropriate for easy access to any Portacath or PICC line.   We strive to give you quality time with your provider. You may need to reschedule your appointment if you arrive late (15 or more minutes).  Arriving late affects you and other patients whose appointments are after yours.  Also, if you miss three or more appointments without notifying the office, you may be dismissed from the clinic at the provider's discretion.      For prescription refill requests, have your pharmacy contact our office and allow 72 hours for refills to be completed.    Today you received the following chemotherapy and/or immunotherapy agents : Libtayo      To help prevent nausea and vomiting after your treatment, we encourage you to take your nausea medication as directed.  BELOW ARE SYMPTOMS THAT SHOULD BE REPORTED IMMEDIATELY: *FEVER GREATER THAN 100.4 F (38 C) OR HIGHER *CHILLS OR SWEATING *NAUSEA AND VOMITING THAT IS NOT CONTROLLED WITH YOUR NAUSEA MEDICATION *UNUSUAL SHORTNESS OF BREATH *UNUSUAL BRUISING OR BLEEDING *URINARY PROBLEMS (pain or burning when urinating, or frequent urination) *BOWEL PROBLEMS (unusual diarrhea, constipation, pain near the anus) TENDERNESS IN MOUTH AND THROAT WITH OR WITHOUT PRESENCE OF ULCERS (sore throat, sores in mouth, or a toothache) UNUSUAL RASH, SWELLING OR PAIN  UNUSUAL VAGINAL DISCHARGE OR ITCHING   Items with * indicate a potential emergency and should be followed up as soon as possible or go to the Emergency Department if any problems should occur.  Please show the CHEMOTHERAPY ALERT CARD or IMMUNOTHERAPY  ALERT CARD at check-in to the Emergency Department and triage nurse.  Should you have questions after your visit or need to cancel or reschedule your appointment, please contact [facility] - A DEPT OF [facility]  Dept: [phone]  and follow the prompts.  Office hours are 8:00 a.m. to 4:30 p.m. Monday - Friday. Please note that voicemails left after 4:00 p.m. may not be returned until the following business day.  We are closed weekends and major holidays. You have access to a nurse at all times for urgent questions. Please call the main number to the clinic Dept: [phone] and follow the prompts.   For any non-urgent questions, you may also contact your provider using MyChart. We now offer e-Visits for anyone 96 and older to request care online for non-urgent symptoms. For details visit [email or web address].   Also download the MyChart app! Go to the app store, search "MyChart", open the app, select [facility], and log in with your MyChart username and password.

## 2023-01-19 DIAGNOSIS — Z23 Encounter for immunization: Secondary | ICD-10-CM | POA: Diagnosis not present

## 2023-01-25 ENCOUNTER — Encounter (HOSPITAL_COMMUNITY)
Admission: RE | Admit: 2023-01-25 | Discharge: 2023-01-25 | Disposition: A | Discharge status: Home / Self Care | Payer: Medicare Other | Source: Ambulatory Visit | Attending: Hematology and Oncology | Admitting: Hematology and Oncology

## 2023-01-25 ENCOUNTER — Other Ambulatory Visit: Payer: Self-pay

## 2023-01-25 ENCOUNTER — Other Ambulatory Visit: Payer: Self-pay | Admitting: *Deleted

## 2023-01-25 ENCOUNTER — Encounter: Payer: Self-pay | Admitting: Hematology and Oncology

## 2023-01-25 ENCOUNTER — Other Ambulatory Visit: Payer: Medicare Other

## 2023-01-25 ENCOUNTER — Inpatient Hospital Stay: Payer: Medicare Other | Attending: Hematology and Oncology

## 2023-01-25 ENCOUNTER — Telehealth: Payer: Self-pay | Admitting: *Deleted

## 2023-01-25 DIAGNOSIS — Z79620 Long term (current) use of immunosuppressive biologic: Secondary | ICD-10-CM | POA: Insufficient documentation

## 2023-01-25 DIAGNOSIS — Z79899 Other long term (current) drug therapy: Secondary | ICD-10-CM | POA: Insufficient documentation

## 2023-01-25 DIAGNOSIS — C4441 Basal cell carcinoma of skin of scalp and neck: Secondary | ICD-10-CM | POA: Insufficient documentation

## 2023-01-25 DIAGNOSIS — K6389 Other specified diseases of intestine: Secondary | ICD-10-CM | POA: Diagnosis not present

## 2023-01-25 DIAGNOSIS — E041 Nontoxic single thyroid nodule: Secondary | ICD-10-CM | POA: Insufficient documentation

## 2023-01-25 DIAGNOSIS — I722 Aneurysm of renal artery: Secondary | ICD-10-CM | POA: Insufficient documentation

## 2023-01-25 DIAGNOSIS — Z5112 Encounter for antineoplastic immunotherapy: Secondary | ICD-10-CM | POA: Insufficient documentation

## 2023-01-25 DIAGNOSIS — I7121 Aneurysm of the ascending aorta, without rupture: Secondary | ICD-10-CM | POA: Insufficient documentation

## 2023-01-25 DIAGNOSIS — R300 Dysuria: Secondary | ICD-10-CM

## 2023-01-25 DIAGNOSIS — I700 Atherosclerosis of aorta: Secondary | ICD-10-CM | POA: Insufficient documentation

## 2023-01-25 DIAGNOSIS — C4492 Squamous cell carcinoma of skin, unspecified: Secondary | ICD-10-CM

## 2023-01-25 DIAGNOSIS — K4090 Unilateral inguinal hernia, without obstruction or gangrene, not specified as recurrent: Secondary | ICD-10-CM | POA: Diagnosis not present

## 2023-01-25 DIAGNOSIS — C7951 Secondary malignant neoplasm of bone: Secondary | ICD-10-CM | POA: Insufficient documentation

## 2023-01-25 DIAGNOSIS — R590 Localized enlarged lymph nodes: Secondary | ICD-10-CM | POA: Insufficient documentation

## 2023-01-25 DIAGNOSIS — Z95828 Presence of other vascular implants and grafts: Secondary | ICD-10-CM

## 2023-01-25 DIAGNOSIS — I2510 Atherosclerotic heart disease of native coronary artery without angina pectoris: Secondary | ICD-10-CM | POA: Diagnosis not present

## 2023-01-25 LAB — URINALYSIS, COMPLETE (UACMP) WITH MICROSCOPIC
Bilirubin Urine: NEGATIVE
Glucose, UA: NEGATIVE mg/dL
Ketones, ur: NEGATIVE mg/dL
Nitrite: NEGATIVE
Protein, ur: NEGATIVE mg/dL
Specific Gravity, Urine: 1.002 — ABNORMAL LOW (ref 1.005–1.030)
WBC, UA: >50 WBC/hpf (ref 0–5)
pH: 6 (ref 5.0–8.0)

## 2023-01-25 LAB — CMP (CANCER CENTER ONLY)
ALT: 19 U/L (ref 0–44)
AST: 22 U/L (ref 15–41)
Albumin: 4 g/dL (ref 3.5–5.0)
Alkaline Phosphatase: 85 U/L (ref 38–126)
Anion gap: 8 (ref 5–15)
BUN: 22 mg/dL (ref 8–23)
CO2: 24 mmol/L (ref 22–32)
Calcium: 9.2 mg/dL (ref 8.9–10.3)
Chloride: 101 mmol/L (ref 98–111)
Creatinine: 1.27 mg/dL — ABNORMAL HIGH (ref 0.61–1.24)
GFR, Estimated: 54 mL/min — ABNORMAL LOW (ref >60)
Glucose, Bld: 103 mg/dL — ABNORMAL HIGH (ref 70–99)
Potassium: 3.9 mmol/L (ref 3.5–5.1)
Sodium: 133 mmol/L — ABNORMAL LOW (ref 135–145)
Total Bilirubin: 0.9 mg/dL (ref <1.2)
Total Protein: 6.6 g/dL (ref 6.5–8.1)

## 2023-01-25 LAB — GLUCOSE, CAPILLARY: Glucose-Capillary: 112 mg/dL — ABNORMAL HIGH (ref 70–99)

## 2023-01-25 LAB — CBC WITH DIFFERENTIAL (CANCER CENTER ONLY)
Abs Immature Granulocytes: 0.03 10*3/uL (ref 0.00–0.07)
Basophils Absolute: 0 10*3/uL (ref 0.0–0.1)
Basophils Relative: 0 %
Eosinophils Absolute: 0 10*3/uL (ref 0.0–0.5)
Eosinophils Relative: 0 %
HCT: 40.8 % (ref 39.0–52.0)
Hemoglobin: 14.4 g/dL (ref 13.0–17.0)
Immature Granulocytes: 0 %
Lymphocytes Relative: 9 %
Lymphs Abs: 0.9 10*3/uL (ref 0.7–4.0)
MCH: 32.5 pg (ref 26.0–34.0)
MCHC: 35.3 g/dL (ref 30.0–36.0)
MCV: 92.1 fL (ref 80.0–100.0)
Monocytes Absolute: 0.9 10*3/uL (ref 0.1–1.0)
Monocytes Relative: 8 %
Neutro Abs: 8.6 10*3/uL — ABNORMAL HIGH (ref 1.7–7.7)
Neutrophils Relative %: 83 %
Platelet Count: 249 10*3/uL (ref 150–400)
RBC: 4.43 MIL/uL (ref 4.22–5.81)
RDW: 13.3 % (ref 11.5–15.5)
WBC Count: 10.5 10*3/uL (ref 4.0–10.5)
nRBC: 0 % (ref 0.0–0.2)

## 2023-01-25 MED ORDER — FLUDEOXYGLUCOSE F - 18 (FDG) INJECTION
9.0900 | Freq: Once | INTRAVENOUS | Status: AC
  Administered 2023-01-25: 9.09 via INTRAVENOUS

## 2023-01-25 MED ORDER — SODIUM CHLORIDE 0.9% FLUSH
10.0000 mL | INTRAVENOUS | Status: DC | PRN
  Administered 2023-01-25: 10 mL

## 2023-01-25 MED ORDER — HEPARIN SOD (PORK) LOCK FLUSH 100 UNIT/ML IV SOLN
500.0000 [IU] | Freq: Once | INTRAVENOUS | Status: AC | PRN
  Administered 2023-01-25: 500 [IU]

## 2023-01-25 NOTE — Telephone Encounter (Signed)
Received call from patient. He states he is having s/s of a UTI. Frequency and urgency esp @ night. He also has lower abdominal discomfort, pressure.  Denies fever, chills. Advised to stop by the cancer center today to give [place] a urine sample for u/a and culture. He said he would come by here this afternoon.  Lab appt made.

## 2023-01-26 ENCOUNTER — Other Ambulatory Visit: Payer: Self-pay

## 2023-01-26 MED ORDER — CIPROFLOXACIN HCL 500 MG PO TABS: 500.0000 mg | ORAL_TABLET | Freq: Two times a day (BID) | ORAL | 0 refills | Status: AC

## 2023-01-27 LAB — URINE CULTURE: Culture: 40000 — AB

## 2023-02-01 ENCOUNTER — Telehealth: Payer: Self-pay | Admitting: *Deleted

## 2023-02-01 NOTE — Telephone Encounter (Signed)
-----   Message from Ulysees Barns IV sent at 01/30/2023 11:17 AM EST ----- Please let Martin Curry know that his urine did grow E. Coli, but that the E. Coli should be sensitive to the ciprofloxacin antibiotic we called in for him. We will plan to see him back as scheduled. Strict return precautions for worsening UTI or infectious symptoms. ----- Message ----- From: Leory Plowman, Lab In [place] Sent: 01/25/2023   1:12 PM EST To: Jaci Standard, MD

## 2023-02-01 NOTE — Telephone Encounter (Signed)
Received call back from pt. He has the cipro. And is feeling much better.

## 2023-02-01 NOTE — Telephone Encounter (Signed)
TCT patient regarding urine culture results. No answer. Was able to leave vm message for pt to return this call @ [phone].  This call is advise that his urine did grow E. Coli, but that the E. Coli should be sensitive to the ciprofloxacin antibiotic we called in for him. We will plan to see him back as scheduled. Strict return precautions for worsening UTI or infectious symptoms.

## 2023-02-02 ENCOUNTER — Other Ambulatory Visit: Payer: Self-pay | Admitting: *Deleted

## 2023-02-02 DIAGNOSIS — C7951 Secondary malignant neoplasm of bone: Secondary | ICD-10-CM

## 2023-02-02 DIAGNOSIS — C4441 Basal cell carcinoma of skin of scalp and neck: Secondary | ICD-10-CM

## 2023-02-02 NOTE — Progress Notes (Unsigned)
[facility] [phone]   [phone]  PROGRESS NOTE  Patient Care Team: Garlan Fillers, MD as PCP - General (Internal Medicine) Garlan Fillers, MD as Consulting Physician (Internal Medicine) Exie Parody, MD (Hematology and Oncology) Bjorn Pippin, MD as Attending Physician (Urology) Mardella Layman, MD as Attending Physician (Gastroenterology) Janalyn Harder, MD (Inactive) as Consulting Physician (Dermatology) Lonie Peak, MD as Consulting Physician (Radiation Oncology) Malmfelt, Lise Auer, RN as Oncology Nurse Navigator (Oncology)  Hematological/Oncological History # Bone Lesions on CT/MRI # 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 Additionally there was noted to be a new enhancing lesion at the tip of the right greater trochanter measuring up to 1.7 cm also suspicious for metastatic disease. 09/27/2020: establish care with Martin Curry 10/29/2020: CT biopsy performed of left iliac bone lesion. Pathology showed bone and marrow without evidence of carcinoma.  11/11/2022: Cycle 1 Day 1 of cemplimab therapy.  12/03/2022: Cycle 2 Day 1 of cemplimab therapy.  12/23/2022: Cycle 3 Day 1 of cemplimab therapy.  01/13/2023: Cycle 4 Day 1 of cemplimab therapy.   Interval History:  Martin Curry 89 y.o. male with medical history significant for bone lesions noted on CT/MRI who presents for a follow up visit. The patient's last visit was on 01/13/2023. In the interim since the last visit he has completed Cycle 4 of cemiplimab.   On exam today Martin Curry reports he has been well overall with interim since our last visit.  He reports he did  have symptoms of urinary tract infection which did respond well to our Cipro therapy.  He notes that he is drinking plenty of water to stay hydrated.  He notes that despite the colon lesion noted on PET CT scan he is not having any alteration of bowel habits or any dark stools.  He reports that he does have "the energy to get along".  He does have some periodic fatigue.  He notes he is eating well and not had any recent infectious symptoms such as runny nose, sore throat, or cough.  He is not having barely any drainage from his ear and the lesion on his neck appears to be healing.  Overall he feels well and is willing and able to proceed with immunotherapy at this time he denies any fevers, chills, sweats, nausea, vomiting or diarrhea.  His weight is been stable.  A full 10 point ROS is listed below.  We did discuss the results of his PET CT scan which shows response to his Cemiplimab therapy.  The area on the right side of his neck is responding on both the PET CT scan and on physical exam.  We also did discuss the FDG avid colon lesion again.  It is quite prominent and given that he is having good response with the rest of his cancer may be worth repeat evaluation.  We discussed the risks and benefits of further evaluation and he would like to consider colonoscopy to evaluate this further.  We will reach out to GI to see their thoughts.  MEDICAL HISTORY:  Past Medical History:  Diagnosis Date   Atypical nevus 09/07/2002   left abdomen-slight   BCC (basal cell carcinoma  of skin) 07/19/2014   left sideburn- +margin-exc.   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   Nodular basal cell carcinoma (BCC) 07/16/2015   left scalp-CX35FU   Polycythemia    Prostate cancer (HCC) 1990's   total prostatectomy and adjuvant radiation. Last PSA was 0.02 in 09/2011   SCC (squamous cell carcinoma) 04/03/2009   forehead-txpbx   SCC (squamous cell carcinoma) 07/16/2015   left front scalp   SCC (squamous cell carcinoma) 07/16/2015   left front scalp-Cx35FU   SCCA (squamous cell carcinoma) of skin 12/20/2019   Right forehead (in situ)   SCCA (squamous cell carcinoma) of skin 05/27/2020   Mid Parietal Scalp (in situ)   Superficial basal cell carcinoma (BCC) 11/21/2012   behind left ear-CX35FU, midback-CX35FU, right forehead-CX35FU-exc   Superficial basal cell carcinoma (BCC) 11/02/2017   left post neck-CX35FU   Superficial nodular basal cell carcinoma (BCC) 12/20/2019   Left temporal scalp   Superficial nodular basal cell carcinoma (BCC) 05/27/2020   Right Preauricular area (curet and 5FU)    SURGICAL HISTORY: Past Surgical History:  Procedure Laterality Date   ADJACENT TISSUE TRANSFER/TISSUE REARRANGEMENT N/A 05/22/2019   Procedure: COMPLEX CLOSURE OF NECK WOUND;  Surgeon: Allena Napoleon, MD;  Location: [facility] OR;  Service: Plastics;  Laterality: N/A;   ALLOGRAFT APPLICATION N/A 05/22/2019   Procedure: POSSIBLE FACIAL NERVE RECONSTRUCTION WITH NERVE ALLOGRAFT VS AUTOGRAFT;  Surgeon: Allena Napoleon, MD;  Location: [facility] OR;  Service: Plastics;  Laterality: N/A;   BLADDER SURGERY     due to prostatectomy.    CATARACT EXTRACTION     right   COLONOSCOPY     neg in the past; due in 2014 with [place] Dr. Jarold Motto   EYE SURGERY     50 years ago.    HERNIA REPAIR     IR IMAGING GUIDED PORT INSERTION  11/30/2022   MOHS SURGERY      NECK DISSECTION  05/22/2019    NECK DISSECTION   PAROTIDECTOMY  05/22/2019   PAROTIDECTOMY with facial nerve dissection    PAROTIDECTOMY Right 05/22/2019   Procedure: PAROTIDECTOMY;  Surgeon: Serena Colonel, MD;  Location: [facility] OR;  Service: ENT;  Laterality: Right;   PROSTATE SURGERY     RADICAL NECK DISSECTION Right 05/22/2019   Procedure: RADICAL NECK DISSECTION;  Surgeon: Serena Colonel, MD;  Location: [facility] OR;  Service: ENT;  Laterality: Right;   TONSILLECTOMY      SOCIAL HISTORY: Social History   Socioeconomic History   Marital status: Married    Spouse name: Not on file   Number of children: 3   Years of education: Not on file   Highest education level: Not on file  Occupational History    Comment: retired Scientist, [employer]; 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   Occupation: retired  Tobacco Use   Smoking status: Never  Smokeless tobacco: Never  Vaping Use   Vaping status: Never Used  Substance and Sexual Activity   Alcohol use: No   Drug use: No   Sexual activity: Not on file  Other Topics Concern   Not on file  Social History Narrative   Not on file   Social Drivers of Health   Financial Resource Strain: Not on file  Food Insecurity: No Food Insecurity (10/29/2022)   Hunger Vital Sign    Worried About Running Out of Food in the Last Year: Never true    Ran Out of Food in the Last Year: Never true  Transportation Needs: No Transportation Needs (10/29/2022)   PRAPARE - Administrator, Civil Service (Medical): No    Lack of Transportation (Non-Medical): No  Physical Activity: Not on file  Stress: Not on file  Social Connections: Not on file  Intimate Partner Violence: Not At Risk (10/29/2022)   Humiliation, Afraid, Rape, and Kick questionnaire    Fear of Current or Ex-Partner: No    Emotionally Abused: No    Physically Abused: No    Sexually Abused: No    FAMILY HISTORY: Family History  Problem Relation Age of Onset   Uterine cancer Mother 60   Ovarian cancer Mother     Lung cancer Father 5   Prostate cancer Brother    Prostate cancer Brother    Colon cancer Neg Hx    Stomach cancer Neg Hx    Esophageal cancer Neg Hx     ALLERGIES:  is allergic to methylprednisolone, penicillins, latex, and tape.  MEDICATIONS:  Current Outpatient Medications  Medication Sig Dispense Refill   amLODipine (NORVASC) 5 MG tablet Take 5 mg by mouth daily.      aspirin 81 MG tablet Take 81 mg by mouth daily.     benazepril (LOTENSIN) 10 MG tablet Take 10 mg by mouth daily.     famotidine-calcium carbonate-magnesium hydroxide (PEPCID COMPLETE) 10-800-165 MG chewable tablet Take one tablet PRN     levothyroxine (SYNTHROID) 25 MCG tablet Take 25 mcg by mouth daily before breakfast.     lidocaine-prilocaine (EMLA) cream Apply 1 Application topically as needed. 30 g 0   Multiple Vitamin (MULTIVITAMIN) tablet Take 1 tablet by mouth daily.     ondansetron (ZOFRAN) 8 MG tablet Take 1 tablet (8 mg total) by mouth every 8 (eight) hours as needed for nausea or vomiting. 20 tablet 1   potassium citrate (UROCIT-K) 10 MEQ (1080 MG) SR tablet Take 10 mEq by mouth 3 (three) times daily with meals.     simvastatin (ZOCOR) 20 MG tablet Take 20 mg by mouth every evening.     No current facility-administered medications for this visit.    REVIEW OF SYSTEMS:   Constitutional: ( - ) fevers, ( - )  chills , ( - ) night sweats Eyes: ( - ) blurriness of vision, ( - ) double vision, ( - ) watery eyes Ears, nose, mouth, throat, and face: ( - ) mucositis, ( - ) sore throat Respiratory: ( - ) cough, ( - ) dyspnea, ( - ) wheezes Cardiovascular: ( - ) palpitation, ( - ) chest discomfort, ( - ) lower extremity swelling Gastrointestinal:  ( - ) nausea, ( - ) heartburn, ( - ) change in bowel habits Skin: ( - ) abnormal skin rashes Lymphatics: ( - ) new lymphadenopathy, ( - ) easy bruising Neurological: ( - ) numbness, ( - ) tingling, ( - ) new  weaknesses Behavioral/Psych: ( - ) mood change, ( - )  new changes  All other systems were reviewed with the patient and are negative.  PHYSICAL EXAMINATION: ECOG PERFORMANCE STATUS: 1 - Symptomatic but completely ambulatory  Vitals:   02/03/23 0928  BP: 125/72  Pulse: 82  Resp: 15  Temp: 97.9 F (36.6 C)  SpO2: 99%    Filed Weights   02/03/23 0928  Weight: 180 lb 8 oz (81.9 kg)    GENERAL: Well-appearing elderly Caucasian male, alert, no distress and comfortable SKIN: skin color, texture, turgor are normal, no rashes or significant lesions EYES: conjunctiva are pink and non-injected, sclera clear LUNGS: clear to auscultation and percussion with normal breathing effort HEART: regular rate & rhythm and no murmurs and no lower extremity edema Musculoskeletal: no cyanosis of digits and no clubbing  PSYCH: alert & oriented x 3, fluent speech NEURO: no focal motor/sensory deficits  LABORATORY DATA:  I have reviewed the data as listed    Latest Ref Rng & Units 02/03/2023    9:02 AM 01/25/2023   12:43 PM 01/13/2023    9:25 AM  CBC  WBC 4.0 - 10.5 K/uL 9.7  10.5  7.6   Hemoglobin 13.0 - 17.0 g/dL 84.1  32.4  40.1   Hematocrit 39.0 - 52.0 % 44.1  40.8  44.1   Platelets 150 - 400 K/uL 385  249  220        Latest Ref Rng & Units 02/03/2023    9:02 AM 01/25/2023   12:43 PM 01/13/2023    9:25 AM  CMP  Glucose 70 - 99 mg/dL 027  253  96   BUN 8 - 23 mg/dL 20  22  20    Creatinine 0.61 - 1.24 mg/dL 6.64  4.03  4.74   Sodium 135 - 145 mmol/L 137  133  140   Potassium 3.5 - 5.1 mmol/L 4.0  3.9  4.3   Chloride 98 - 111 mmol/L 106  101  108   CO2 22 - 32 mmol/L 24  24  26    Calcium 8.9 - 10.3 mg/dL 9.4  9.2  25.9   Total Protein 6.5 - 8.1 g/dL 6.9  6.6  6.8   Total Bilirubin <1.2 mg/dL 0.6  0.9  0.6   Alkaline Phos 38 - 126 U/L 74  85  77   AST 15 - 41 U/L 24  22  20    ALT 0 - 44 U/L 26  19  21      Lab Results  Component Value Date   MPROTEIN Not Observed 09/27/2020   Lab Results  Component Value Date   KPAFRELGTCHN 25.6  (H) 09/27/2020   LAMBDASER 23.0 09/27/2020   KAPLAMBRATIO 1.11 09/27/2020    RADIOGRAPHIC STUDIES: NM PET Image Restage (PS) Whole Body (F-18 FDG) Result Date: 02/03/2023 CLINICAL DATA:  Subsequent treatment strategy for squamous cell carcinoma of the skin. EXAM: NUCLEAR MEDICINE PET WHOLE BODY TECHNIQUE: 9.1 mCi F-18 FDG was injected intravenously. Full-ring PET imaging was performed from the head to foot after the radiotracer. CT data was obtained and used for attenuation correction and anatomic localization. Fasting blood glucose: 112 mg/dl COMPARISON:  56/38/7564. FINDINGS: Mediastinal blood pool activity: SUV max 2.6 HEAD/NECK: Soft tissue mass arising from or extending into the right mastoid measures 5.2 x 5.5 cm (4/43), unchanged in size when remeasured in a similar fashion. Associated marked decrease in hypermetabolism, now SUV max 13.7, compared to 36.2 previously. No additional abnormal hypermetabolism. Incidental CT  findings: None. CHEST: Hypermetabolic low left internal jugular lymph node measures 6 mm (4/76), SUV max 4.9, new. Vague hypermetabolism associated with a dominant right thyroid nodule, measuring 3.0 cm, SUV max 3.8. Similar mediastinal, subcarinal and hilar lymph nodes. Index subcarinal lymph node measures 12 mm, SUV max 9.2. Similar distal esophageal mild hypermetabolism, SUV max 4.5. No additional abnormal hypermetabolism. Incidental CT findings: Right IJ Port-A-Cath terminates in the right atrium. Atherosclerotic calcification of the aorta, aortic valve and coronary arteries. Ascending aorta measures approximately 4.5 cm. Heart is enlarged. No pericardial or pleural effusion. ABDOMEN/PELVIS: Small retroperitoneal lymph nodes show minimal hypermetabolism with an index left periaortic lymph node measuring 7 mm (4/146), SUV max 3.0, new. Hypermetabolic cecal mass measures 1.8 x 2.5 cm (4/178), SUV max 26.2, increased slightly from 23.1. Nonspecific focal vascular hypermetabolism in  the left upper quadrant. Incidental CT findings: Low-attenuation lesions in the kidneys. No specific follow-up necessary. Small hiatal hernia. 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 Retroaortic left renal vein. Left inguinal hernia contains unobstructed colon. Prostatectomy. SKELETON: Similar scattered osseous hypermetabolism. Index hypermetabolism in the inferior sacrum, SUV max 8.7, compared to 11.2 previously. Incidental CT findings: Degenerative changes in the spine. EXTREMITIES: No abnormal hypermetabolism. Incidental CT findings: None. IMPRESSION: 1. Interval response to therapy as evidenced by decrease in FDG avidity within a destructive soft tissue mass involving the right mastoid as well as within osseous metastases. New small hypermetabolic low left internal jugular and abdominal retroperitoneal lymph nodes. Difficult to exclude metastatic disease. Recommend attention on follow-up. 2. Hypermetabolic cecal mass, highly worrisome for colon carcinoma. 3. Hypermetabolic right thyroid nodule, as before. Ultrasound and biopsy could be performed, as clinically indicated given advanced age and other comorbidities. 4. Chronically hypermetabolic mediastinal and hilar lymph nodes, possibly related to sarcoid. 5. 4.5 cm ascending aortic aneurysm. Ascending thoracic aortic aneurysm. Recommend semi-annual imaging followup by CTA or MRA and referral to cardiothoracic surgery if not already obtained. This recommendation follows 2010 ACCF/AHA/AATS/ACR/ASA/SCA/SCAI/SIR/STS/SVM Guidelines for the Diagnosis and Management of Patients With Thoracic Aortic Disease. Circulation. 2010; 121: G956-O130. Aortic aneurysm NOS (ICD10-I71.9). 6. 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 7. Left inguinal hernia contains unobstructed colon. 8. Aortic atherosclerosis (ICD10-I70.0). Coronary artery calcification. Electronically Signed   By: Leanna Battles [name]   On: 02/03/2023 09:45    ASSESSMENT & PLAN Martin Curry 89 y.o. male with medical  history significant for static squamous cell carcinoma of unclear origin as well as basal cell carcinoma behind the right ear who presents for follow-up visit.  At this time the patient's clinical findings are complicated.  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  It is unclear if these 2 processes are the same or 2 separate primaries.  Additionally there is no clear evidence of a primary squamous cell carcinoma (however my concern is that the area behind the right ear may represent the same process).  Patient also does have PET avid areas of his bowels which I do not suspect are related, but we will have evaluated further with EGD/colonoscopy as needed.  At this time the best treatment course would be to treat with Cemiplimab which should be effective at treating both basal cell carcinoma and squamous cell carcinoma, effectively treating both even if they are separate processes.  Overall he voices understanding of this complicated situation and our plan moving forward.  Of note radiation oncology and ENT are on board and  willing to assist with management of his current findings.  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 Previously discussed with radiology who notes a repeat biopsy was not recommended and repeat imaging would be preferred.  --negative multiple myeloma labs with SPEP and serum free light chains showing no evidence of monoclonal gammopathy.  --Given his prior history of prostate cancer we ordered a PSA, which was normal. Repeated PSA previously, found to be within normal limits --CT C/A/P shows no evidence of disease elsewhere in the body in August 2022. Most recent PET CT scan on 10/02/2022 showed hypermetabolic osseous metastasis with a dominant mass centered about the inferior medial portion of the  right mastoid, suspicious for site of primary squamous cell carcinoma. -- PET CT scan shows evidence of lesion behind the right ear as well as the known pelvic metastasis. -- Reviewed tissue with pathology to determine if both lesions are the same pathology or different origins.  At this time findings are most consistent with a basal cell primary behind his ear and squamous cell carcinoma of the pelvis.  It is possible these at the same pathology with the basal/squamous crossover, though they may represent separate primaries.  Either way Cemiplimab therapy should be adequate to treat both. -- PD-L1 testing showed 0% PD-L1 score, however the use of cemiplimab does not require PD-L1 positivity (lack of PD-L1 prevents the use of pembrolizumab)  PLAN: --Proceed with cycle 5 day 1 of Cemiplimab immunotherapy today --Labs today show white blood cell count 9.7, hemoglobin 14.9, MCV 92.6, platelets 385 --PET CT scan on 01/25/2023 showed response to therapy as evidenced by decrease in FDG avidity within a destructive soft tissue mass involving the right mastoid as well as within osseous metastases.  Also previously noted hypermetabolic cecal mass, highly worrisome for colon carcinoma .  Discussed below.  Next PET CT scan in March 2025. -- Return to clinic for next Cycle of cemiplimab in 3 weeks time.   # Bleeding of the Ear--improved --  secondary due to erosion of the tumor into his ear canal. -- Bleeding has been improving with treatment. -- Will likely be treated by immunotherapy, however if it worsens could consider palliative radiation to the area in order to stanch the bleeding.  # FDG Avid Colon Lesion -- Suspicious for adenocarcinoma of the colon versus large polyp. -- Previously patient has been discussed with GI and held off on colonoscopy. -- Given findings on most recent PET scan would recommend reevaluation.  Discussed risks and benefits with patient and he notes he would like to reconsider  colonoscopy. -- Will reach out to gastroenterology.  Orders Placed This Encounter  Procedures   CBC with Differential (Cancer Center Only)    Standing Status:   Future    Expected Date:   02/22/2023    Expiration Date:   02/22/2024   CMP (Cancer Center only)    Standing Status:   Future    Expected Date:   02/22/2023    Expiration Date:   02/22/2024   T4    Standing Status:   Future    Expected Date:   02/22/2023    Expiration Date:   02/22/2024   TSH    Standing Status:   Future    Expected Date:   02/22/2023    Expiration Date:   02/22/2024   CBC with Differential (Cancer Center Only)    Standing  Status:   Future    Expected Date:   03/15/2023    Expiration Date:   03/14/2024   CMP (Cancer Center only)    Standing Status:   Future    Expected Date:   03/15/2023    Expiration Date:   03/14/2024   T4    Standing Status:   Future    Expected Date:   03/15/2023    Expiration Date:   03/14/2024   TSH    Standing Status:   Future    Expected Date:   03/15/2023    Expiration Date:   03/14/2024   CBC with Differential (Cancer Center Only)    Standing Status:   Future    Expected Date:   04/05/2023    Expiration Date:   04/04/2024   CMP (Cancer Center only)    Standing Status:   Future    Expected Date:   04/05/2023    Expiration Date:   04/04/2024   T4    Standing Status:   Future    Expected Date:   04/05/2023    Expiration Date:   04/04/2024   TSH    Standing Status:   Future    Expected Date:   04/05/2023    Expiration Date:   04/04/2024   CBC with Differential (Cancer Center Only)    Standing Status:   Future    Expected Date:   04/26/2023    Expiration Date:   04/25/2024   CMP (Cancer Center only)    Standing Status:   Future    Expected Date:   04/26/2023    Expiration Date:   04/25/2024   T4    Standing Status:   Future    Expected Date:   04/26/2023    Expiration Date:   04/25/2024   TSH    Standing Status:   Future    Expected Date:   04/26/2023    Expiration Date:   04/25/2024     All questions were answered. The patient knows to call the clinic with any problems, questions or concerns.  A total of more than 30 minutes were spent on this encounter with face-to-face time and non-face-to-face time, including preparing to see the patient, ordering tests and/or medications, counseling the patient and coordination of care as outlined above.   Ulysees Barns, MD Department of Hematology/Oncology [facility] at [facility] Phone: [phone] Pager: [phone] Email: [email or web address]  02/04/2023 9:04 AM

## 2023-02-03 ENCOUNTER — Other Ambulatory Visit: Payer: Self-pay

## 2023-02-03 ENCOUNTER — Other Ambulatory Visit: Payer: Medicare Other

## 2023-02-03 ENCOUNTER — Inpatient Hospital Stay: Payer: Medicare Other

## 2023-02-03 ENCOUNTER — Inpatient Hospital Stay (HOSPITAL_BASED_OUTPATIENT_CLINIC_OR_DEPARTMENT_OTHER): Payer: Medicare Other | Admitting: Hematology and Oncology

## 2023-02-03 VITALS — BP 125/72 | HR 82 | Temp 97.9°F | Resp 15 | Wt 180.5 lb

## 2023-02-03 DIAGNOSIS — Z95828 Presence of other vascular implants and grafts: Secondary | ICD-10-CM | POA: Diagnosis not present

## 2023-02-03 DIAGNOSIS — C7951 Secondary malignant neoplasm of bone: Secondary | ICD-10-CM

## 2023-02-03 DIAGNOSIS — Z5112 Encounter for antineoplastic immunotherapy: Secondary | ICD-10-CM | POA: Diagnosis not present

## 2023-02-03 DIAGNOSIS — Z79620 Long term (current) use of immunosuppressive biologic: Secondary | ICD-10-CM | POA: Diagnosis not present

## 2023-02-03 DIAGNOSIS — C4441 Basal cell carcinoma of skin of scalp and neck: Secondary | ICD-10-CM

## 2023-02-03 DIAGNOSIS — C4492 Squamous cell carcinoma of skin, unspecified: Secondary | ICD-10-CM

## 2023-02-03 DIAGNOSIS — Z79899 Other long term (current) drug therapy: Secondary | ICD-10-CM | POA: Diagnosis not present

## 2023-02-03 LAB — CMP (CANCER CENTER ONLY)
ALT: 26 U/L (ref 0–44)
AST: 24 U/L (ref 15–41)
Albumin: 3.9 g/dL (ref 3.5–5.0)
Alkaline Phosphatase: 74 U/L (ref 38–126)
Anion gap: 7 (ref 5–15)
BUN: 20 mg/dL (ref 8–23)
CO2: 24 mmol/L (ref 22–32)
Calcium: 9.4 mg/dL (ref 8.9–10.3)
Chloride: 106 mmol/L (ref 98–111)
Creatinine: 1.37 mg/dL — ABNORMAL HIGH (ref 0.61–1.24)
GFR, Estimated: 50 mL/min — ABNORMAL LOW (ref >60)
Glucose, Bld: 147 mg/dL — ABNORMAL HIGH (ref 70–99)
Potassium: 4 mmol/L (ref 3.5–5.1)
Sodium: 137 mmol/L (ref 135–145)
Total Bilirubin: 0.6 mg/dL (ref <1.2)
Total Protein: 6.9 g/dL (ref 6.5–8.1)

## 2023-02-03 LAB — CBC WITH DIFFERENTIAL (CANCER CENTER ONLY)
Abs Immature Granulocytes: 0.12 10*3/uL — ABNORMAL HIGH (ref 0.00–0.07)
Basophils Absolute: 0.1 10*3/uL (ref 0.0–0.1)
Basophils Relative: 1 %
Eosinophils Absolute: 0.2 10*3/uL (ref 0.0–0.5)
Eosinophils Relative: 2 %
HCT: 44.1 % (ref 39.0–52.0)
Hemoglobin: 14.9 g/dL (ref 13.0–17.0)
Immature Granulocytes: 1 %
Lymphocytes Relative: 12 %
Lymphs Abs: 1.2 10*3/uL (ref 0.7–4.0)
MCH: 31.3 pg (ref 26.0–34.0)
MCHC: 33.8 g/dL (ref 30.0–36.0)
MCV: 92.6 fL (ref 80.0–100.0)
Monocytes Absolute: 0.6 10*3/uL (ref 0.1–1.0)
Monocytes Relative: 6 %
Neutro Abs: 7.6 10*3/uL (ref 1.7–7.7)
Neutrophils Relative %: 78 %
Platelet Count: 385 10*3/uL (ref 150–400)
RBC: 4.76 MIL/uL (ref 4.22–5.81)
RDW: 13 % (ref 11.5–15.5)
WBC Count: 9.7 10*3/uL (ref 4.0–10.5)
nRBC: 0 % (ref 0.0–0.2)

## 2023-02-03 MED ORDER — HEPARIN SOD (PORK) LOCK FLUSH 100 UNIT/ML IV SOLN
500.0000 [IU] | Freq: Once | INTRAVENOUS | Status: AC | PRN
Start: 2023-02-03 — End: 2023-02-03
  Administered 2023-02-03: 500 [IU]

## 2023-02-03 MED ORDER — SODIUM CHLORIDE 0.9 % IV SOLN: Freq: Once | INTRAVENOUS | Status: AC

## 2023-02-03 MED ORDER — SODIUM CHLORIDE 0.9% FLUSH
10.0000 mL | INTRAVENOUS | Status: DC | PRN
  Administered 2023-02-03: 10 mL

## 2023-02-03 MED ORDER — SODIUM CHLORIDE 0.9 % IV SOLN
350.0000 mg | Freq: Once | INTRAVENOUS | Status: AC
  Administered 2023-02-03: 350 mg via INTRAVENOUS
  Filled 2023-02-03: qty 7

## 2023-02-03 NOTE — Patient Instructions (Signed)
[facility] - A DEPT OF [facility]  Discharge Instructions: Thank you for choosing [facility] to provide your oncology and hematology care.   If you have a lab appointment with the Cancer Center, please go directly to the Cancer Center and check in at the registration area.   Wear comfortable clothing and clothing appropriate for easy access to any Portacath or PICC line.   We strive to give you quality time with your provider. You may need to reschedule your appointment if you arrive late (15 or more minutes).  Arriving late affects you and other patients whose appointments are after yours.  Also, if you miss three or more appointments without notifying the office, you may be dismissed from the clinic at the provider's discretion.      For prescription refill requests, have your pharmacy contact our office and allow 72 hours for refills to be completed.    Today you received the following chemotherapy and/or immunotherapy agents: Libtayo.       To help prevent nausea and vomiting after your treatment, we encourage you to take your nausea medication as directed.  BELOW ARE SYMPTOMS THAT SHOULD BE REPORTED IMMEDIATELY: *FEVER GREATER THAN 100.4 F (38 C) OR HIGHER *CHILLS OR SWEATING *NAUSEA AND VOMITING THAT IS NOT CONTROLLED WITH YOUR NAUSEA MEDICATION *UNUSUAL SHORTNESS OF BREATH *UNUSUAL BRUISING OR BLEEDING *URINARY PROBLEMS (pain or burning when urinating, or frequent urination) *BOWEL PROBLEMS (unusual diarrhea, constipation, pain near the anus) TENDERNESS IN MOUTH AND THROAT WITH OR WITHOUT PRESENCE OF ULCERS (sore throat, sores in mouth, or a toothache) UNUSUAL RASH, SWELLING OR PAIN  UNUSUAL VAGINAL DISCHARGE OR ITCHING   Items with * indicate a potential emergency and should be followed up as soon as possible or go to the Emergency Department if any problems should occur.  Please show the CHEMOTHERAPY ALERT CARD or IMMUNOTHERAPY  ALERT CARD at check-in to the Emergency Department and triage nurse.  Should you have questions after your visit or need to cancel or reschedule your appointment, please contact [facility] - A DEPT OF [facility]  Dept: [phone]  and follow the prompts.  Office hours are 8:00 a.m. to 4:30 p.m. Monday - Friday. Please note that voicemails left after 4:00 p.m. may not be returned until the following business day.  We are closed weekends and major holidays. You have access to a nurse at all times for urgent questions. Please call the main number to the clinic Dept: [phone] and follow the prompts.   For any non-urgent questions, you may also contact your provider using MyChart. We now offer e-Visits for anyone 79 and older to request care online for non-urgent symptoms. For details visit [email or web address].   Also download the MyChart app! Go to the app store, search "MyChart", open the app, select , and log in with your MyChart username and password.

## 2023-02-04 ENCOUNTER — Encounter: Payer: Self-pay | Admitting: Internal Medicine

## 2023-02-04 ENCOUNTER — Other Ambulatory Visit: Payer: Self-pay

## 2023-02-04 ENCOUNTER — Encounter: Payer: Self-pay | Admitting: Hematology and Oncology

## 2023-02-04 ENCOUNTER — Telehealth: Payer: Self-pay | Admitting: Internal Medicine

## 2023-02-04 DIAGNOSIS — R948 Abnormal results of function studies of other organs and systems: Secondary | ICD-10-CM

## 2023-02-04 NOTE — Telephone Encounter (Signed)
Spoke with the patient. He will come to the 2nd floor of [facility] today for Plenvu prep instructions.  Referral entered into EPIC for insurance authorization marked as URGENT.

## 2023-02-04 NOTE — Telephone Encounter (Signed)
I was contacted by his oncologist Dr. Jeanie Sewer who informed me that his recent PET scan showed increased activity of his cecal lesion.  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  I discussed this with the patient, and he is currently interested in getting a colonoscopy.  We will plan to get him scheduled at the [facility] for a colonoscopy on 12/16 p.m., which is my next earliest opening.

## 2023-02-07 ENCOUNTER — Other Ambulatory Visit: Payer: Self-pay

## 2023-02-08 ENCOUNTER — Encounter: Payer: Self-pay | Admitting: Internal Medicine

## 2023-02-08 ENCOUNTER — Ambulatory Visit: Payer: Medicare Other | Admitting: Internal Medicine

## 2023-02-08 VITALS — BP 113/71 | HR 83 | Temp 98.1°F | Resp 10 | Ht 69.0 in | Wt 174.0 lb

## 2023-02-08 DIAGNOSIS — D128 Benign neoplasm of rectum: Secondary | ICD-10-CM

## 2023-02-08 DIAGNOSIS — D122 Benign neoplasm of ascending colon: Secondary | ICD-10-CM | POA: Diagnosis not present

## 2023-02-08 DIAGNOSIS — K5730 Diverticulosis of large intestine without perforation or abscess without bleeding: Secondary | ICD-10-CM

## 2023-02-08 DIAGNOSIS — R948 Abnormal results of function studies of other organs and systems: Secondary | ICD-10-CM

## 2023-02-08 DIAGNOSIS — K648 Other hemorrhoids: Secondary | ICD-10-CM

## 2023-02-08 DIAGNOSIS — D123 Benign neoplasm of transverse colon: Secondary | ICD-10-CM | POA: Diagnosis not present

## 2023-02-08 DIAGNOSIS — D125 Benign neoplasm of sigmoid colon: Secondary | ICD-10-CM

## 2023-02-08 DIAGNOSIS — D127 Benign neoplasm of rectosigmoid junction: Secondary | ICD-10-CM | POA: Diagnosis not present

## 2023-02-08 DIAGNOSIS — D120 Benign neoplasm of cecum: Secondary | ICD-10-CM | POA: Diagnosis not present

## 2023-02-08 DIAGNOSIS — R933 Abnormal findings on diagnostic imaging of other parts of digestive tract: Secondary | ICD-10-CM | POA: Diagnosis not present

## 2023-02-08 DIAGNOSIS — K635 Polyp of colon: Secondary | ICD-10-CM | POA: Diagnosis not present

## 2023-02-08 MED ORDER — SODIUM CHLORIDE 0.9 % IV SOLN: 500.0000 mL | Freq: Once | INTRAVENOUS | Status: DC

## 2023-02-08 NOTE — Patient Instructions (Addendum)
Resume home medications and diet.  Follow up for repeat colonoscopy will be determined by pathology results.  Handout provided on polyps.    YOU HAD AN ENDOSCOPIC PROCEDURE TODAY AT THE [facility]:   Refer to the procedure report that was given to you for any specific questions about what was found during the examination.  If the procedure report does not answer your questions, please call your gastroenterologist to clarify.  If you requested that your care partner not be given the details of your procedure findings, then the procedure report has been included in a sealed envelope for you to review at your convenience later.  YOU SHOULD EXPECT: Some feelings of bloating in the abdomen. Passage of more gas than usual.  Walking can help get rid of the air that was put into your GI tract during the procedure and reduce the bloating. If you had a lower endoscopy (such as a colonoscopy or flexible sigmoidoscopy) you may notice spotting of blood in your stool or on the toilet paper. If you underwent a bowel prep for your procedure, you may not have a normal bowel movement for a few days.  Please Note:  You might notice some irritation and congestion in your nose or some drainage.  This is from the oxygen used during your procedure.  There is no need for concern and it should clear up in a day or so.  SYMPTOMS TO REPORT IMMEDIATELY:  Following lower endoscopy (colonoscopy or flexible sigmoidoscopy):  Excessive amounts of blood in the stool  Significant tenderness or worsening of abdominal pains  Swelling of the abdomen that is new, acute  Fever of 100F or higher  For urgent or emergent issues, a gastroenterologist can be reached at any hour by calling [phone]. Do not use MyChart messaging for urgent concerns.    DIET:  We do recommend a small meal at first, but then you may proceed to your regular diet.  Drink plenty of fluids but you should avoid alcoholic beverages for 24  hours.  ACTIVITY:  You should plan to take it easy for the rest of today and you should NOT DRIVE or use heavy machinery until tomorrow (because of the sedation medicines used during the test).    FOLLOW UP: Our staff will call the number listed on your records the next business day following your procedure.  We will call around 7:15- 8:00 am to check on you and address any questions or concerns that you may have regarding the information given to you following your procedure. If we do not reach you, we will leave a message.     If any biopsies were taken you will be contacted by phone or by letter within the next 1-3 weeks.  Please call us at [phone] if you have not heard about the biopsies in 3 weeks.    SIGNATURES/CONFIDENTIALITY: You and/or your care partner have signed paperwork which will be entered into your electronic medical record.  These signatures attest to the fact that that the information above on your After Visit Summary has been reviewed and is understood.  Full responsibility of the confidentiality of this discharge information lies with you and/or your care-partner.

## 2023-02-08 NOTE — Op Note (Signed)
[facility] Patient Name: Martin Curry Procedure Date: 02/08/2023 3:25 PM MRN: [id number] Endoscopist: Madelyn Brunner [place] , , [id number] Age: 89 Referring MD:  Date of Birth: 1933/02/08 Gender: Male Account #: [id number] Procedure:                Colonoscopy Indications:              Abnormal PET scan of the GI tract Medicines:                Monitored Anesthesia Care Procedure:                Pre-Anesthesia Assessment:                           - Prior to the procedure, a History and Physical                            was performed, and patient medications and                            allergies were reviewed. The patient's tolerance of                            previous anesthesia was also reviewed. The risks                            and benefits of the procedure and the sedation                            options and risks were discussed with the patient.                            All questions were answered, and informed consent                            was obtained. Prior Anticoagulants: The patient has                            taken no anticoagulant or antiplatelet agents. ASA                            Grade Assessment: III - A patient with severe                            systemic disease. After reviewing the risks and                            benefits, the patient was deemed in satisfactory                            condition to undergo the procedure.                           After obtaining informed consent, the colonoscope  was passed under direct vision. Throughout the                            procedure, the patient's blood pressure, pulse, and                            oxygen saturations were monitored continuously. The                            Olympus Scope SN [id number] was introduced through the                            anus and advanced to the the terminal ileum. The                            colonoscopy was  performed without difficulty. The                            patient tolerated the procedure well. The quality                            of the bowel preparation was good. The terminal                            ileum, ileocecal valve, appendiceal orifice, and                            rectum were photographed. Scope In: 3:45:44 PM Scope Out: 4:48:51 PM Scope Withdrawal Time: 0 hours 58 minutes 27 seconds  Total Procedure Duration: 1 hour 3 minutes 7 seconds  Findings:                 The terminal ileum appeared normal.                           A 25 mm polyp was found in the cecum. The polyp was                            sessile. Preparations were made for mucosal                            resection. Saline was injected to raise the lesion.                            Piecemeal mucosal resection using a snare was                            performed. Snare tip cautery was applied to the                            edges of the polyp. Resection and retrieval were                            complete.  A 15 mm polyp was found in the cecum. The polyp was                            sessile. The polyp was removed with a hot snare.                            Resection and retrieval were complete.                           12 sessile polyps were found in the transverse                            colon, ascending colon and cecum. The polyps were 3                            to 12 mm in size. These polyps were removed with a                            cold snare. Resection and retrieval were complete.                           Multiple diverticula were found in the sigmoid                            colon and descending colon.                           Five sessile polyps were found in the rectum and                            sigmoid colon. The polyps were 3 to 6 mm in size.                            These polyps were removed with a cold snare.                             Resection and retrieval were complete.                           Non-bleeding internal hemorrhoids were found during                            retroflexion. Complications:            No immediate complications. Estimated Blood Loss:     Estimated blood loss was minimal. Impression:               - The examined portion of the ileum was normal.                           - One 25 mm polyp in the cecum, removed with  mucosal resection. Snare tip cautery applied.                            Resected and retrieved.                           - One 15 mm polyp in the cecum, removed with a hot                            snare. Resected and retrieved.                           - 12 3 to 12 mm polyps in the transverse colon, in                            the ascending colon and in the cecum, removed with                            a cold snare. Resected and retrieved.                           - Diverticulosis in the sigmoid colon and in the                            descending colon.                           - Five 3 to 6 mm polyps in the rectum and in the                            sigmoid colon, removed with a cold snare. Resected                            and retrieved.                           - Non-bleeding internal hemorrhoids.                           - Mucosal resection was performed. Resection and                            retrieval were complete. Recommendation:           - Discharge patient to home (with escort).                           - Await pathology results.                           - Consider repeat colonoscopy in 6 months for                            surveillance after piecemeal polypectomy.                           -  Return to GI clinic in 5 months to decide if                            repeat colonoscopy should be pursued.                           - The findings and recommendations were discussed                            with the  patient. Dr Particia Lather "Alan Ripper" Leonides Schanz,  02/08/2023 5:00:49 PM

## 2023-02-08 NOTE — Progress Notes (Signed)
Report to PACU, RN, vss, BBS= Clear.  

## 2023-02-08 NOTE — Progress Notes (Signed)
Called to room to assist during endoscopic procedure.  Patient ID and intended procedure confirmed with present staff. Received instructions for my participation in the procedure from the performing physician.  

## 2023-02-08 NOTE — Progress Notes (Signed)
GASTROENTEROLOGY PROCEDURE H&P NOTE   Primary Care Physician: Garlan Fillers, MD    Reason for Procedure:   Abnormal PET/CT scan with cecal mass  Plan:    Colonoscopy  Patient is appropriate for endoscopic procedure(s) in the ambulatory ([facility]) setting.  The nature of the procedure, as well as the risks, benefits, and alternatives were carefully and thoroughly reviewed with the patient. Ample time for discussion and questions allowed. The patient understood, was satisfied, and agreed to proceed.     HPI: Martin Curry is a 89 y.o. male who presents for colonoscopy for evaluation of abnormal PET/CT scan with cecal mass.  Patient was most recently seen in the Gastroenterology Clinic on 11/03/22.  No interval change in medical history since that appointment. Please refer to that note for full details regarding GI history and clinical presentation.   Past Medical History:  Diagnosis Date   Atypical nevus 09/07/2002   left abdomen-slight   BCC (basal cell carcinoma of skin) 07/19/2014   left sideburn- +margin-exc.   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   Nodular basal cell carcinoma (BCC) 07/16/2015   left scalp-CX35FU   Polycythemia    Prostate cancer (HCC) 1990's   total prostatectomy and adjuvant radiation. Last PSA was 0.02 in 09/2011   SCC (squamous cell carcinoma) 04/03/2009    forehead-txpbx   SCC (squamous cell carcinoma) 07/16/2015   left front scalp   SCC (squamous cell carcinoma) 07/16/2015   left front scalp-Cx35FU   SCCA (squamous cell carcinoma) of skin 12/20/2019   Right forehead (in situ)   SCCA (squamous cell carcinoma) of skin 05/27/2020   Mid Parietal Scalp (in situ)   Superficial basal cell carcinoma (BCC) 11/21/2012   behind left ear-CX35FU, midback-CX35FU, right forehead-CX35FU-exc   Superficial basal cell carcinoma (BCC) 11/02/2017   left post neck-CX35FU   Superficial nodular basal cell carcinoma (BCC) 12/20/2019   Left temporal scalp   Superficial nodular basal cell carcinoma (BCC) 05/27/2020   Right Preauricular area (curet and 5FU)    Past Surgical History:  Procedure Laterality Date   ADJACENT TISSUE TRANSFER/TISSUE REARRANGEMENT N/A 05/22/2019   Procedure: COMPLEX CLOSURE OF NECK WOUND;  Surgeon: Allena Napoleon, MD;  Location: [facility] OR;  Service: Plastics;  Laterality: N/A;   ALLOGRAFT APPLICATION N/A 05/22/2019   Procedure: POSSIBLE FACIAL NERVE RECONSTRUCTION WITH NERVE ALLOGRAFT VS AUTOGRAFT;  Surgeon: Allena Napoleon, MD;  Location: [facility] OR;  Service: Plastics;  Laterality: N/A;   BLADDER SURGERY     due to prostatectomy.    CATARACT EXTRACTION     right   COLONOSCOPY     neg in the past; due in 2014 with [facility] Dr. Jarold Motto   EYE SURGERY     50 years ago.    HERNIA REPAIR     IR IMAGING GUIDED PORT INSERTION  11/30/2022   MOHS SURGERY     NECK DISSECTION  05/22/2019    NECK DISSECTION   PAROTIDECTOMY  05/22/2019   PAROTIDECTOMY with facial nerve dissection    PAROTIDECTOMY Right 05/22/2019   Procedure: PAROTIDECTOMY;  Surgeon: Serena Colonel, MD;  Location: [facility] OR;  Service: ENT;  Laterality: Right;   PROSTATE SURGERY     RADICAL NECK DISSECTION Right 05/22/2019   Procedure: RADICAL NECK DISSECTION;  Surgeon: Serena Colonel, MD;  Location: [facility] OR;  Service: ENT;  Laterality: Right;   TONSILLECTOMY      Prior to Admission  medications   Medication Sig Start Date End Date Taking? Authorizing Provider  amLODipine (NORVASC) 5 MG tablet Take 5 mg by mouth daily.  04/17/19   [provider]  aspirin 81 MG tablet Take 81 mg by mouth daily.    [provider]  benazepril (LOTENSIN) 10 MG tablet Take 10 mg by mouth daily.    [provider]  famotidine-calcium carbonate-magnesium hydroxide (PEPCID COMPLETE) 10-800-165 MG chewable tablet Take one tablet PRN 01/04/18   [provider]  levothyroxine (SYNTHROID) 25 MCG tablet Take 25 mcg by mouth daily before breakfast.    [provider]  lidocaine-prilocaine (EMLA) cream Apply 1 Application topically as needed. 12/23/22   Jaci Standard, MD  Multiple Vitamin (MULTIVITAMIN) tablet Take 1 tablet by mouth daily.    [provider]  ondansetron (ZOFRAN) 8 MG tablet Take 1 tablet (8 mg total) by mouth every 8 (eight) hours as needed for nausea or vomiting. 11/11/22   Jaci Standard, MD  potassium citrate (UROCIT-K) 10 MEQ (1080 MG) SR tablet Take 10 mEq by mouth 3 (three) times daily with meals.    [provider]  simvastatin (ZOCOR) 20 MG tablet Take 20 mg by mouth every evening.    [provider]  promethazine (PHENERGAN) 25 MG suppository Place 1 suppository (25 mg total) rectally every 6 (six) hours as needed for nausea or vomiting. Patient not taking: Reported on 05/31/2019 05/23/19 08/18/19  Serena Colonel, MD    Current Outpatient Medications  Medication Sig Dispense Refill   amLODipine (NORVASC) 5 MG tablet Take 5 mg by mouth daily.      aspirin 81 MG tablet Take 81 mg by mouth daily.     benazepril (LOTENSIN) 10 MG tablet Take 10 mg by mouth daily.     famotidine-calcium carbonate-magnesium hydroxide (PEPCID COMPLETE) 10-800-165 MG chewable tablet Take one tablet PRN     levothyroxine (SYNTHROID) 25 MCG tablet Take 25 mcg by mouth daily before breakfast.     lidocaine-prilocaine (EMLA) cream  Apply 1 Application topically as needed. 30 g 0   Multiple Vitamin (MULTIVITAMIN) tablet Take 1 tablet by mouth daily.     ondansetron (ZOFRAN) 8 MG tablet Take 1 tablet (8 mg total) by mouth every 8 (eight) hours as needed for nausea or vomiting. 20 tablet 1   potassium citrate (UROCIT-K) 10 MEQ (1080 MG) SR tablet Take 10 mEq by mouth 3 (three) times daily with meals.     simvastatin (ZOCOR) 20 MG tablet Take 20 mg by mouth every evening.     Current Facility-Administered Medications  Medication Dose Route Frequency Provider Last Rate Last Admin   0.9 %  sodium chloride infusion  500 mL Intravenous Once Imogene Burn, MD        Allergies as of 02/08/2023 - Review Complete 02/08/2023  Allergen Reaction Noted   Methylprednisolone  04/22/2021  Penicillins  06/30/2011   Latex Rash 06/30/2011   Tape Rash 05/16/2019    Family History  Problem Relation Age of Onset   Uterine cancer Mother 25   Ovarian cancer Mother    Lung cancer Father 84   Prostate cancer Brother    Prostate cancer Brother    Colon cancer Neg Hx    Stomach cancer Neg Hx    Esophageal cancer Neg Hx     Social History   Socioeconomic History   Marital status: Married    Spouse name: Not on file   Number of children: 3   Years of education: Not on file   Highest education level: Not on file  Occupational History    Comment: retired Scientist, [employer]; 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   Occupation: retired  Tobacco Use   Smoking status: Never   Smokeless tobacco: Never  Vaping Use   Vaping status: Never Used  Substance and Sexual Activity   Alcohol use: No   Drug use: No   Sexual activity: Not on file  Other Topics Concern   Not on file  Social History Narrative   Not on file   Social Drivers of Health   Financial Resource Strain: Not on file  Food Insecurity: No Food Insecurity (10/29/2022)   Hunger Vital Sign    Worried About Running Out of Food in the Last Year: Never true    Ran Out of Food in the Last Year:  Never true  Transportation Needs: No Transportation Needs (10/29/2022)   PRAPARE - Administrator, Civil Service (Medical): No    Lack of Transportation (Non-Medical): No  Physical Activity: Not on file  Stress: Not on file  Social Connections: Not on file  Intimate Partner Violence: Not At Risk (10/29/2022)   Humiliation, Afraid, Rape, and Kick questionnaire    Fear of Current or Ex-Partner: No    Emotionally Abused: No    Physically Abused: No    Sexually Abused: No    Physical Exam: Vital signs in last 24 hours: BP 108/78   Pulse 95   Temp 98.1 F (36.7 C) (Temporal)   Ht 5\' 9"  (1.753 m)   Wt 174 lb (78.9 kg)   SpO2 96%   BMI 25.70 kg/m  GEN: NAD EYE: Sclerae anicteric ENT: MMM CV: Non-tachycardic Pulm: No increased WOB GI: Soft NEURO:  Alert & Oriented   Eulah Pont, MD [facility]   02/08/2023 3:26 PM

## 2023-02-09 ENCOUNTER — Telehealth: Payer: Self-pay | Admitting: *Deleted

## 2023-02-09 NOTE — Telephone Encounter (Signed)
  Follow up Call-     02/08/2023    3:14 PM  Call back number  Post procedure Call Back phone  # [phone]  Permission to leave phone message Yes     Patient questions:  Do you have a fever, pain , or abdominal swelling? No. Pain Score  0 *  Have you tolerated food without any problems? Yes.    Have you been able to return to your normal activities? Yes.    Do you have any questions about your discharge instructions: Diet   No. Medications  No. Follow up visit  No.  Do you have questions or concerns about your Care? No.  Actions: * If pain score is 4 or above: No action needed, pain <4.

## 2023-02-12 LAB — SURGICAL PATHOLOGY

## 2023-02-14 ENCOUNTER — Encounter: Payer: Self-pay | Admitting: Internal Medicine

## 2023-02-22 NOTE — Progress Notes (Signed)
 [facility] [phone]) [phone]   [phone]  PROGRESS NOTE  Patient Care Team: Yolande Toribio MATSU, MD as PCP - General (Internal Medicine) Yolande Toribio MATSU, MD as Consulting Physician (Internal Medicine) Twana Jeneal DASEN, MD (Hematology and Oncology) Watt Rush, MD as Attending Physician (Urology) Jakie Alm SAUNDERS, MD as Attending Physician (Gastroenterology) Livingston Rigg, MD (Inactive) as Consulting Physician (Dermatology) Izell Domino, MD as Consulting Physician (Radiation Oncology) Malmfelt, Delon CROME, RN as Oncology Nurse Navigator (Oncology)  Hematological/Oncological History # Bone Lesions on CT/MRI # 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 Additionally there was noted to be a new enhancing lesion at the tip of the right greater trochanter measuring up to 1.7 cm also suspicious for metastatic disease. 09/27/2020: establish care with Dr. Federico 10/29/2020: CT biopsy performed of left iliac bone lesion. Pathology showed bone and marrow without evidence of carcinoma.  11/11/2022: Cycle 1 Day 1 of cemplimab therapy.  12/03/2022: Cycle 2 Day 1 of cemplimab therapy.  12/23/2022: Cycle 3 Day 1 of cemplimab therapy.  01/13/2023: Cycle 4 Day 1 of cemplimab therapy.  02/03/2023: Cycle 5 Day 1 of cemplimab therapy.  02/23/2023: Cycle 6 Day 1 of cemplimab therapy.   Interval History:  Martin Curry 89 y.o. male with medical history significant for bone lesions noted on CT/MRI who presents for a follow up visit. The patient's last visit was on 02/03/2023. In the interim since the last visit he has completed Cycle 5 of cemiplimab  and has undergone  colonoscopy.   On exam today Martin Curry is accompanied by his daughter.  He reports he feels well overall in the room since her last visit.  He reports that he had no abdominal discomfort after his colonoscopy and reports his bowels are now moving well, though took a day or 2 to reregulate.  He notes he has not noticed any bleeding.  He reports that he is tolerating his immunotherapy well with no major trouble.  He does have an occasional cough but no phlegm production.  He also feels tired on occasion.  His appetite is strong and he is eating well.  He reports he is doing his best to try to drink 64 ounces of liquid per day.  He reports the wound on the backside of his ear continues to heal and he is having less and less drainage from the ear.  He is changing the bandage every other day.  Overall he feels well and is willing and able to proceed with immunotherapy at this time he denies any fevers, chills, sweats, nausea, vomiting or diarrhea.  His weight is been stable.  A full 10 point ROS is listed below.   MEDICAL HISTORY:  Past Medical History:  Diagnosis Date   Atypical nevus 09/07/2002   left abdomen-slight   BCC (basal cell carcinoma of skin) 07/19/2014   left sideburn- +margin-exc.   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 if orignated from skin cancer   Nodular basal cell carcinoma (BCC) 11/21/2012   right side of scalp-CX35FU   Nodular basal cell carcinoma (BCC) 06/12/2014   left sideburn-CX35FU/exc.   Nodular  basal cell carcinoma (BCC) 07/16/2015   left scalp-CX35FU   Polycythemia    Prostate cancer (HCC) 1990's   total prostatectomy and adjuvant radiation. Last PSA was 0.02 in 09/2011   SCC (squamous cell carcinoma) 04/03/2009   forehead-txpbx   SCC (squamous cell carcinoma) 07/16/2015   left front scalp   SCC (squamous cell carcinoma) 07/16/2015   left front scalp-Cx35FU   SCCA (squamous cell carcinoma) of skin 12/20/2019   Right forehead (in situ)   SCCA (squamous cell carcinoma) of skin 05/27/2020   Mid Parietal Scalp (in situ)   Superficial basal cell carcinoma (BCC) 11/21/2012   behind left ear-CX35FU, midback-CX35FU, right forehead-CX35FU-exc   Superficial basal cell carcinoma (BCC) 11/02/2017   left post neck-CX35FU   Superficial nodular basal cell carcinoma (BCC) 12/20/2019   Left temporal scalp   Superficial nodular basal cell carcinoma (BCC) 05/27/2020   Right Preauricular area (curet and 5FU)    SURGICAL HISTORY: Past Surgical History:  Procedure Laterality Date   ADJACENT TISSUE TRANSFER/TISSUE REARRANGEMENT N/A 05/22/2019   Procedure: COMPLEX CLOSURE OF NECK WOUND;  Surgeon: Elisabeth Craig RAMAN, MD;  Location: [facility] OR;  Service: Plastics;  Laterality: N/A;   ALLOGRAFT APPLICATION N/A 05/22/2019   Procedure: POSSIBLE FACIAL NERVE RECONSTRUCTION WITH NERVE ALLOGRAFT VS AUTOGRAFT;  Surgeon: Elisabeth Craig RAMAN, MD;  Location: [facility] OR;  Service: Plastics;  Laterality: N/A;   BLADDER SURGERY     due to prostatectomy.    CATARACT EXTRACTION     right   COLONOSCOPY     neg in the past; due in 2014 with [facility] Dr. Jakie   EYE SURGERY     1950   HERNIA REPAIR     IR IMAGING GUIDED PORT INSERTION  11/30/2022   MOHS SURGERY     NECK DISSECTION  05/22/2019    NECK DISSECTION   PAROTIDECTOMY  05/22/2019   PAROTIDECTOMY with facial nerve dissection    PAROTIDECTOMY Right 05/22/2019   Procedure: PAROTIDECTOMY;  Surgeon: Jesus Oliphant, MD;  Location: [facility] OR;  Service: ENT;  Laterality:  Right;   PROSTATE SURGERY     RADICAL NECK DISSECTION Right 05/22/2019   Procedure: RADICAL NECK DISSECTION;  Surgeon: Jesus Oliphant, MD;  Location: [facility] OR;  Service: ENT;  Laterality: Right;   TONSILLECTOMY      SOCIAL HISTORY: Social History   Socioeconomic History   Marital status: Married    Spouse name: Not on file   Number of children: 3   Years of education: Not on file   Highest education level: Not on file  Occupational History    Comment: retired scientist, forensic; 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   Occupation: retired  Tobacco Use   Smoking status: Never   Smokeless tobacco: Never  Vaping Use   Vaping status: Never Used  Substance and Sexual Activity   Alcohol  use: No   Drug use: No   Sexual activity: Not on file  Other Topics Concern   Not on file  Social History Narrative   Not on  file   Social Drivers of Health   Financial Resource Strain: Not on file  Food Insecurity: No Food Insecurity (10/29/2022)   Hunger Vital Sign    Worried About Running Out of Food in the Last Year: Never true    Ran Out of Food in the Last Year: Never true  Transportation Needs: No Transportation Needs (10/29/2022)   PRAPARE - Administrator, Civil Service (Medical): No    Lack of Transportation (Non-Medical): No  Physical Activity: Not on file  Stress: Not on file  Social Connections: Not on file  Intimate Partner Violence: Not At Risk (10/29/2022)   Humiliation, Afraid, Rape, and Kick questionnaire    Fear of Current or Ex-Partner: No    Emotionally Abused: No    Physically Abused: No    Sexually Abused: No    FAMILY HISTORY: Family History  Problem Relation Age of Onset   Uterine cancer Mother 24   Ovarian cancer Mother    Lung cancer Father 48   Prostate cancer Brother    Prostate cancer Brother    Colon cancer Neg Hx    Stomach cancer Neg Hx    Esophageal cancer Neg Hx    Rectal cancer Neg Hx     ALLERGIES:  is allergic to methylprednisolone, penicillins, latex, and  tape.  MEDICATIONS:  Current Outpatient Medications  Medication Sig Dispense Refill   amLODipine  (NORVASC ) 5 MG tablet Take 5 mg by mouth daily.      aspirin  81 MG tablet Take 81 mg by mouth daily.     benazepril  (LOTENSIN ) 10 MG tablet Take 10 mg by mouth daily.     famotidine -calcium carbonate-magnesium hydroxide (PEPCID  COMPLETE) 10-800-165 MG chewable tablet Take one tablet PRN     levothyroxine  (SYNTHROID ) 25 MCG tablet Take 25 mcg by mouth daily before breakfast.     lidocaine -prilocaine  (EMLA ) cream Apply 1 Application topically as needed. 30 g 0   Multiple Vitamin (MULTIVITAMIN) tablet Take 1 tablet by mouth daily.     ondansetron  (ZOFRAN ) 8 MG tablet Take 1 tablet (8 mg total) by mouth every 8 (eight) hours as needed for nausea or vomiting. (Patient not taking: Reported on 02/08/2023) 20 tablet 1   potassium citrate  (UROCIT-K ) 10 MEQ (1080 MG) SR tablet Take 10 mEq by mouth 3 (three) times daily with meals.     simvastatin  (ZOCOR ) 20 MG tablet Take 20 mg by mouth every evening.     No current facility-administered medications for this visit.   Facility-Administered Medications Ordered in Other Visits  Medication Dose Route Frequency Provider Last Rate Last Admin   sodium chloride  flush (NS) 0.9 % injection 10 mL  10 mL Intracatheter PRN Davinia Riccardi T IV, MD   10 mL at 02/23/23 9176    REVIEW OF SYSTEMS:   Constitutional: ( - ) fevers, ( - )  chills , ( - ) night sweats Eyes: ( - ) blurriness of vision, ( - ) double vision, ( - ) watery eyes Ears, nose, mouth, throat, and face: ( - ) mucositis, ( - ) sore throat Respiratory: ( - ) cough, ( - ) dyspnea, ( - ) wheezes Cardiovascular: ( - ) palpitation, ( - ) chest discomfort, ( - ) lower extremity swelling Gastrointestinal:  ( - ) nausea, ( - ) heartburn, ( - ) change in bowel habits Skin: ( - ) abnormal skin rashes Lymphatics: ( - ) new lymphadenopathy, ( - ) easy bruising Neurological: ( - ) numbness, ( - )  tingling, ( - )  new weaknesses Behavioral/Psych: ( - ) mood change, ( - ) new changes  All other systems were reviewed with the patient and are negative.  PHYSICAL EXAMINATION: ECOG PERFORMANCE STATUS: 1 - Symptomatic but completely ambulatory  Vitals:   02/23/23 0854  BP: 131/75  Pulse: 66  Resp: 13  Temp: 98.6 F (37 C)  SpO2: 98%     Filed Weights   02/23/23 0854  Weight: 179 lb 1.6 oz (81.2 kg)     GENERAL: Well-appearing elderly Caucasian male, alert, no distress and comfortable SKIN: skin color, texture, turgor are normal, no rashes or significant lesions EYES: conjunctiva are pink and non-injected, sclera clear LUNGS: clear to auscultation and percussion with normal breathing effort HEART: regular rate & rhythm and no murmurs and no lower extremity edema Musculoskeletal: no cyanosis of digits and no clubbing  PSYCH: alert & oriented x 3, fluent speech NEURO: no focal motor/sensory deficits  LABORATORY DATA:  I have reviewed the data as listed    Latest Ref Rng & Units 02/23/2023    8:15 AM 02/03/2023    9:02 AM 01/25/2023   12:43 PM  CBC  WBC 4.0 - 10.5 K/uL 7.0  9.7  10.5   Hemoglobin 13.0 - 17.0 g/dL 84.8  85.0  85.5   Hematocrit 39.0 - 52.0 % 43.1  44.1  40.8   Platelets 150 - 400 K/uL 222  385  249        Latest Ref Rng & Units 02/23/2023    8:15 AM 02/03/2023    9:02 AM 01/25/2023   12:43 PM  CMP  Glucose 70 - 99 mg/dL 97  852  896   BUN 8 - 23 mg/dL 21  20  22    Creatinine 0.61 - 1.24 mg/dL 8.74  8.62  8.72   Sodium 135 - 145 mmol/L 138  137  133   Potassium 3.5 - 5.1 mmol/L 4.3  4.0  3.9   Chloride 98 - 111 mmol/L 107  106  101   CO2 22 - 32 mmol/L 26  24  24    Calcium 8.9 - 10.3 mg/dL 89.9  9.4  9.2   Total Protein 6.5 - 8.1 g/dL 6.8  6.9  6.6   Total Bilirubin 0.0 - 1.2 mg/dL 0.6  0.6  0.9   Alkaline Phos 38 - 126 U/L 82  74  85   AST 15 - 41 U/L 22  24  22    ALT 0 - 44 U/L 25  26  19      Lab Results  Component Value Date   MPROTEIN Not Observed  09/27/2020   Lab Results  Component Value Date   KPAFRELGTCHN 25.6 (H) 09/27/2020   LAMBDASER 23.0 09/27/2020   KAPLAMBRATIO 1.11 09/27/2020    RADIOGRAPHIC STUDIES: NM PET Image Restage (PS) Whole Body (F-18 FDG) Result Date: 02/03/2023 CLINICAL DATA:  Subsequent treatment strategy for squamous cell carcinoma of the skin. EXAM: NUCLEAR MEDICINE PET WHOLE BODY TECHNIQUE: 9.1 mCi F-18 FDG was injected intravenously. Full-ring PET imaging was performed from the head to foot after the radiotracer. CT data was obtained and used for attenuation correction and anatomic localization. Fasting blood glucose: 112 mg/dl COMPARISON:  91/90/7975. FINDINGS: Mediastinal blood pool activity: SUV max 2.6 HEAD/NECK: Soft tissue mass arising from or extending into the right mastoid measures 5.2 x 5.5 cm (4/43), unchanged in size when remeasured in a similar fashion. Associated marked decrease in hypermetabolism, now SUV max 13.7, compared  to 36.2 previously. No additional abnormal hypermetabolism. Incidental CT findings: None. CHEST: Hypermetabolic low left internal jugular lymph node measures 6 mm (4/76), SUV max 4.9, new. Vague hypermetabolism associated with a dominant right thyroid  nodule, measuring 3.0 cm, SUV max 3.8. Similar mediastinal, subcarinal and hilar lymph nodes. Index subcarinal lymph node measures 12 mm, SUV max 9.2. Similar distal esophageal mild hypermetabolism, SUV max 4.5. No additional abnormal hypermetabolism. Incidental CT findings: Right IJ Port-A-Cath terminates in the right atrium. Atherosclerotic calcification of the aorta, aortic valve and coronary arteries. Ascending aorta measures approximately 4.5 cm. Heart is enlarged. No pericardial or pleural effusion. ABDOMEN/PELVIS: Small retroperitoneal lymph nodes show minimal hypermetabolism with an index left periaortic lymph node measuring 7 mm (4/146), SUV max 3.0, new. Hypermetabolic cecal mass measures 1.8 x 2.5 cm (4/178), SUV max 26.2,  increased slightly from 23.1. Nonspecific focal vascular hypermetabolism in the left upper quadrant. Incidental CT findings: Low-attenuation lesions in the kidneys. No specific follow-up necessary. Small hiatal hernia. 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 Retroaortic left renal vein. Left inguinal hernia contains unobstructed colon. Prostatectomy. SKELETON: Similar scattered osseous hypermetabolism. Index hypermetabolism in the inferior sacrum, SUV max 8.7, compared to 11.2 previously. Incidental CT findings: Degenerative changes in the spine. EXTREMITIES: No abnormal hypermetabolism. Incidental CT findings: None. IMPRESSION: 1. Interval response to therapy as evidenced by decrease in FDG avidity within a destructive soft tissue mass involving the right mastoid as well as within osseous metastases. New small hypermetabolic low left internal jugular and abdominal retroperitoneal lymph nodes. Difficult to exclude metastatic disease. Recommend attention on follow-up. 2. Hypermetabolic cecal mass, highly worrisome for colon carcinoma. 3. Hypermetabolic right thyroid  nodule, as before. Ultrasound and biopsy could be performed, as clinically indicated given advanced age and other comorbidities. 4. Chronically hypermetabolic mediastinal and hilar lymph nodes, possibly related to sarcoid. 5. 4.5 cm ascending aortic aneurysm. Ascending thoracic aortic aneurysm. Recommend semi-annual imaging followup by CTA or MRA and referral to cardiothoracic surgery if not already obtained. This recommendation follows 2010 ACCF/AHA/AATS/ACR/ASA/SCA/SCAI/SIR/STS/SVM Guidelines for the Diagnosis and Management of Patients With Thoracic Aortic Disease. Circulation. 2010; 121: Z733-z630. Aortic aneurysm NOS (ICD10-I71.9). 6. 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 7. Left inguinal hernia contains unobstructed colon. 8. Aortic atherosclerosis (ICD10-I70.0). Coronary artery calcification. Electronically Signed   By: Newell Eke [name]   On:  02/03/2023 09:45    ASSESSMENT & PLAN Martin Curry 89 y.o. 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  At this time the patient's clinical findings are complicated.  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  It is unclear if these 2 processes are the same or 2 separate primaries.  Additionally there is no clear evidence of a primary squamous cell carcinoma (however my concern is that the area behind the right ear may represent the same process).  Patient also does have PET avid areas of his bowels which I do not suspect are related, but we will have evaluated further with EGD/colonoscopy as needed.  At this time the best treatment course would be to treat with Cemiplimab  which should be effective at treating both basal cell carcinoma and squamous cell carcinoma, effectively treating both even if they are separate processes.  Overall he voices understanding of this complicated situation and our plan moving forward.  Of  note radiation oncology and ENT are on board and willing to assist with management of his current findings.  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 Previously discussed with radiology who notes a repeat biopsy was not recommended and repeat imaging would be preferred.  --negative multiple myeloma labs with SPEP and serum free light chains showing no evidence of monoclonal gammopathy.  --Given his prior history of prostate cancer we ordered a PSA, which was normal. Repeated PSA previously, found to be within normal limits --CT C/A/P shows no evidence of disease elsewhere in the body in August 2022. Most recent PET CT scan on 10/02/2022 showed hypermetabolic  osseous metastasis with a dominant mass centered about the inferior medial portion of the right mastoid, suspicious for site of primary squamous cell carcinoma. -- PET CT scan shows evidence of lesion behind the right ear as well as the known pelvic metastasis. -- Reviewed tissue with pathology to determine if both lesions are the same pathology or different origins.  At this time findings are most consistent with a basal cell primary behind his ear and squamous cell carcinoma of the pelvis.  It is possible these at the same pathology with the basal/squamous crossover, though they may represent separate primaries.  Either way Cemiplimab  therapy should be adequate to treat both. -- PD-L1 testing showed 0% PD-L1 score, however the use of cemiplimab  does not require PD-L1 positivity (lack of PD-L1 prevents the use of pembrolizumab)  PLAN: --Proceed with cycle 6 day 1 of Cemiplimab  immunotherapy today --Labs today show white blood cell count 7.0, hemoglobin 15.1, MCV 90.4, platelets 222 --PET CT scan on 01/25/2023 showed response to therapy as evidenced by decrease in FDG avidity within a destructive soft tissue mass involving the right mastoid as well as within osseous metastases.  Also previously noted hypermetabolic cecal mass, highly worrisome for colon carcinoma .  Discussed below.  Next PET CT scan in March 2025. -- Return to clinic for next Cycle of cemiplimab  in 3 weeks time.   # Bleeding of the Ear--improved --  secondary due to erosion of the tumor into his ear canal. -- Bleeding has been improving with treatment. -- Will likely be treated by immunotherapy, however if it worsens could consider palliative radiation to the area in order to stanch the bleeding.  # FDG Avid Colon Lesion -- Suspicious for adenocarcinoma of the colon versus large polyp. -- Previously patient has been discussed with GI and held off on colonoscopy. -- Given findings on most recent PET scan would recommend  reevaluation.  Discussed risks and benefits with patient and he notes he would like to reconsider colonoscopy. -- Will reach out to gastroenterology.  No orders of the defined types were placed in this encounter.   All questions were answered. The patient knows to call the clinic with any problems, questions or concerns.  A total of more than 30 minutes were spent on this encounter with face-to-face time and non-face-to-face time, including preparing to see the patient, ordering tests and/or medications, counseling the patient and coordination of care as outlined above.   Norleen IVAR Kidney, MD Department of Hematology/Oncology Dr Khamila Bassinger [facility] at [facility] Phone: [phone] Pager: [phone] Email: [email or web address]  02/23/2023 9:19 AM

## 2023-02-23 ENCOUNTER — Inpatient Hospital Stay (HOSPITAL_BASED_OUTPATIENT_CLINIC_OR_DEPARTMENT_OTHER): Payer: Medicare Other | Admitting: Hematology and Oncology

## 2023-02-23 ENCOUNTER — Inpatient Hospital Stay: Payer: Medicare Other

## 2023-02-23 VITALS — BP 131/75 | HR 66 | Temp 98.6°F | Resp 13 | Wt 179.1 lb

## 2023-02-23 DIAGNOSIS — C4492 Squamous cell carcinoma of skin, unspecified: Secondary | ICD-10-CM

## 2023-02-23 DIAGNOSIS — C4441 Basal cell carcinoma of skin of scalp and neck: Secondary | ICD-10-CM

## 2023-02-23 DIAGNOSIS — Z79620 Long term (current) use of immunosuppressive biologic: Secondary | ICD-10-CM | POA: Diagnosis not present

## 2023-02-23 DIAGNOSIS — C7951 Secondary malignant neoplasm of bone: Secondary | ICD-10-CM

## 2023-02-23 DIAGNOSIS — Z5112 Encounter for antineoplastic immunotherapy: Secondary | ICD-10-CM | POA: Diagnosis not present

## 2023-02-23 DIAGNOSIS — Z79899 Other long term (current) drug therapy: Secondary | ICD-10-CM | POA: Diagnosis not present

## 2023-02-23 DIAGNOSIS — Z95828 Presence of other vascular implants and grafts: Secondary | ICD-10-CM

## 2023-02-23 LAB — CBC WITH DIFFERENTIAL (CANCER CENTER ONLY)
Abs Immature Granulocytes: 0.04 10*3/uL (ref 0.00–0.07)
Basophils Absolute: 0 10*3/uL (ref 0.0–0.1)
Basophils Relative: 0 %
Eosinophils Absolute: 0.2 10*3/uL (ref 0.0–0.5)
Eosinophils Relative: 3 %
HCT: 43.1 % (ref 39.0–52.0)
Hemoglobin: 15.1 g/dL (ref 13.0–17.0)
Immature Granulocytes: 1 %
Lymphocytes Relative: 16 %
Lymphs Abs: 1.1 10*3/uL (ref 0.7–4.0)
MCH: 31.7 pg (ref 26.0–34.0)
MCHC: 35 g/dL (ref 30.0–36.0)
MCV: 90.4 fL (ref 80.0–100.0)
Monocytes Absolute: 0.5 10*3/uL (ref 0.1–1.0)
Monocytes Relative: 8 %
Neutro Abs: 5 10*3/uL (ref 1.7–7.7)
Neutrophils Relative %: 72 %
Platelet Count: 222 10*3/uL (ref 150–400)
RBC: 4.77 MIL/uL (ref 4.22–5.81)
RDW: 13.2 % (ref 11.5–15.5)
WBC Count: 7 10*3/uL (ref 4.0–10.5)
nRBC: 0 % (ref 0.0–0.2)

## 2023-02-23 LAB — CMP (CANCER CENTER ONLY)
ALT: 25 U/L (ref 0–44)
AST: 22 U/L (ref 15–41)
Albumin: 4.1 g/dL (ref 3.5–5.0)
Alkaline Phosphatase: 82 U/L (ref 38–126)
Anion gap: 5 (ref 5–15)
BUN: 21 mg/dL (ref 8–23)
CO2: 26 mmol/L (ref 22–32)
Calcium: 10 mg/dL (ref 8.9–10.3)
Chloride: 107 mmol/L (ref 98–111)
Creatinine: 1.25 mg/dL — ABNORMAL HIGH (ref 0.61–1.24)
GFR, Estimated: 55 mL/min — ABNORMAL LOW (ref >60)
Glucose, Bld: 97 mg/dL (ref 70–99)
Potassium: 4.3 mmol/L (ref 3.5–5.1)
Sodium: 138 mmol/L (ref 135–145)
Total Bilirubin: 0.6 mg/dL (ref 0.0–1.2)
Total Protein: 6.8 g/dL (ref 6.5–8.1)

## 2023-02-23 LAB — TSH: TSH: 4.911 u[IU]/mL — ABNORMAL HIGH (ref 0.350–4.500)

## 2023-02-23 MED ORDER — SODIUM CHLORIDE 0.9% FLUSH
10.0000 mL | INTRAVENOUS | Status: DC | PRN
Start: 2023-02-23 — End: 2023-02-23
  Administered 2023-02-23: 10 mL

## 2023-02-23 MED ORDER — SODIUM CHLORIDE 0.9 % IV SOLN: Freq: Once | INTRAVENOUS | Status: AC

## 2023-02-23 MED ORDER — HEPARIN SOD (PORK) LOCK FLUSH 100 UNIT/ML IV SOLN
500.0000 [IU] | Freq: Once | INTRAVENOUS | Status: AC | PRN
  Administered 2023-02-23: 500 [IU]

## 2023-02-23 MED ORDER — SODIUM CHLORIDE 0.9 % IV SOLN
350.0000 mg | Freq: Once | INTRAVENOUS | Status: AC
  Administered 2023-02-23: 350 mg via INTRAVENOUS
  Filled 2023-02-23: qty 7

## 2023-02-23 NOTE — Patient Instructions (Signed)
 [facility] - A DEPT OF [facility]  Discharge Instructions: Thank you for choosing [facility] to provide your oncology and hematology care.   If you have a lab appointment with the Cancer Center, please go directly to the Cancer Center and check in at the registration area.   Wear comfortable clothing and clothing appropriate for easy access to any Portacath or PICC line.   We strive to give you quality time with your provider. You may need to reschedule your appointment if you arrive late (15 or more minutes).  Arriving late affects you and other patients whose appointments are after yours.  Also, if you miss three or more appointments without notifying the office, you may be dismissed from the clinic at the provider's discretion.      For prescription refill requests, have your pharmacy contact our office and allow 72 hours for refills to be completed.    Today you received the following chemotherapy and/or immunotherapy agents Libtayo  To help prevent nausea and vomiting after your treatment, we encourage you to take your nausea medication as directed.  BELOW ARE SYMPTOMS THAT SHOULD BE REPORTED IMMEDIATELY: *FEVER GREATER THAN 100.4 F (38 C) OR HIGHER *CHILLS OR SWEATING *NAUSEA AND VOMITING THAT IS NOT CONTROLLED WITH YOUR NAUSEA MEDICATION *UNUSUAL SHORTNESS OF BREATH *UNUSUAL BRUISING OR BLEEDING *URINARY PROBLEMS (pain or burning when urinating, or frequent urination) *BOWEL PROBLEMS (unusual diarrhea, constipation, pain near the anus) TENDERNESS IN MOUTH AND THROAT WITH OR WITHOUT PRESENCE OF ULCERS (sore throat, sores in mouth, or a toothache) UNUSUAL RASH, SWELLING OR PAIN  UNUSUAL VAGINAL DISCHARGE OR ITCHING   Items with * indicate a potential emergency and should be followed up as soon as possible or go to the Emergency Department if any problems should occur.  Please show the CHEMOTHERAPY ALERT CARD or IMMUNOTHERAPY ALERT  CARD at check-in to the Emergency Department and triage nurse.  Should you have questions after your visit or need to cancel or reschedule your appointment, please contact [facility] - [facility] DE[facility]  Dept: [phone]  and follow the prompts.  Office hours are 8:00 a.m. to 4:30 p.m. Monday - Friday. Please note that voicemails left after 4:00 p.m. may not be returned until the following business day.  We are closed weekends and major holidays. You have access to a nurse at all times for urgent questions. Please call the main number to the clinic Dept: [phone] and follow the prompts.   For any non-urgent questions, you may also contact your provider using MyChart. We now offer e-Visits for anyone 59 and older to request care online for non-urgent symptoms. For details visit [email or web address].   Also download the MyChart app! Go to the app store, search MyChart, open the app, select [place], and log in with your MyChart username and password.

## 2023-02-24 LAB — T4: T4, Total: 8.4 ug/dL (ref 4.5–12.0)

## 2023-03-01 ENCOUNTER — Other Ambulatory Visit: Payer: Medicare Other

## 2023-03-01 ENCOUNTER — Ambulatory Visit: Payer: Medicare Other | Admitting: Hematology and Oncology

## 2023-03-16 ENCOUNTER — Inpatient Hospital Stay: Payer: Medicare Other

## 2023-03-16 ENCOUNTER — Inpatient Hospital Stay: Payer: Medicare Other | Attending: Hematology and Oncology | Admitting: Hematology and Oncology

## 2023-03-16 VITALS — BP 130/66 | HR 64 | Temp 98.0°F | Resp 13 | Wt 183.5 lb

## 2023-03-16 DIAGNOSIS — C4492 Squamous cell carcinoma of skin, unspecified: Secondary | ICD-10-CM

## 2023-03-16 DIAGNOSIS — Z79620 Long term (current) use of immunosuppressive biologic: Secondary | ICD-10-CM | POA: Insufficient documentation

## 2023-03-16 DIAGNOSIS — C7951 Secondary malignant neoplasm of bone: Secondary | ICD-10-CM | POA: Diagnosis not present

## 2023-03-16 DIAGNOSIS — Z5112 Encounter for antineoplastic immunotherapy: Secondary | ICD-10-CM | POA: Insufficient documentation

## 2023-03-16 DIAGNOSIS — C4441 Basal cell carcinoma of skin of scalp and neck: Secondary | ICD-10-CM

## 2023-03-16 DIAGNOSIS — Z95828 Presence of other vascular implants and grafts: Secondary | ICD-10-CM

## 2023-03-16 LAB — CMP (CANCER CENTER ONLY)
ALT: 22 U/L (ref 0–44)
AST: 20 U/L (ref 15–41)
Albumin: 4.1 g/dL (ref 3.5–5.0)
Alkaline Phosphatase: 75 U/L (ref 38–126)
Anion gap: 6 (ref 5–15)
BUN: 18 mg/dL (ref 8–23)
CO2: 26 mmol/L (ref 22–32)
Calcium: 9.7 mg/dL (ref 8.9–10.3)
Chloride: 109 mmol/L (ref 98–111)
Creatinine: 1.2 mg/dL (ref 0.61–1.24)
GFR, Estimated: 58 mL/min — ABNORMAL LOW (ref >60)
Glucose, Bld: 121 mg/dL — ABNORMAL HIGH (ref 70–99)
Potassium: 4.1 mmol/L (ref 3.5–5.1)
Sodium: 141 mmol/L (ref 135–145)
Total Bilirubin: 0.6 mg/dL (ref 0.0–1.2)
Total Protein: 6.5 g/dL (ref 6.5–8.1)

## 2023-03-16 LAB — CBC WITH DIFFERENTIAL (CANCER CENTER ONLY)
Abs Immature Granulocytes: 0.01 10*3/uL (ref 0.00–0.07)
Basophils Absolute: 0.1 10*3/uL (ref 0.0–0.1)
Basophils Relative: 1 %
Eosinophils Absolute: 0.2 10*3/uL (ref 0.0–0.5)
Eosinophils Relative: 3 %
HCT: 42.4 % (ref 39.0–52.0)
Hemoglobin: 14.8 g/dL (ref 13.0–17.0)
Immature Granulocytes: 0 %
Lymphocytes Relative: 17 %
Lymphs Abs: 1.1 10*3/uL (ref 0.7–4.0)
MCH: 31.4 pg (ref 26.0–34.0)
MCHC: 34.9 g/dL (ref 30.0–36.0)
MCV: 89.8 fL (ref 80.0–100.0)
Monocytes Absolute: 0.6 10*3/uL (ref 0.1–1.0)
Monocytes Relative: 10 %
Neutro Abs: 4.4 10*3/uL (ref 1.7–7.7)
Neutrophils Relative %: 69 %
Platelet Count: 204 10*3/uL (ref 150–400)
RBC: 4.72 MIL/uL (ref 4.22–5.81)
RDW: 13.1 % (ref 11.5–15.5)
WBC Count: 6.3 10*3/uL (ref 4.0–10.5)
nRBC: 0 % (ref 0.0–0.2)

## 2023-03-16 LAB — TSH: TSH: 5.649 u[IU]/mL — ABNORMAL HIGH (ref 0.350–4.500)

## 2023-03-16 MED ORDER — SODIUM CHLORIDE 0.9 % IV SOLN: Freq: Once | INTRAVENOUS | Status: AC

## 2023-03-16 MED ORDER — HEPARIN SOD (PORK) LOCK FLUSH 100 UNIT/ML IV SOLN
500.0000 [IU] | Freq: Once | INTRAVENOUS | Status: AC | PRN
  Administered 2023-03-16: 500 [IU]

## 2023-03-16 MED ORDER — SODIUM CHLORIDE 0.9% FLUSH
10.0000 mL | INTRAVENOUS | Status: DC | PRN
  Administered 2023-03-16: 10 mL

## 2023-03-16 MED ORDER — SODIUM CHLORIDE 0.9 % IV SOLN
350.0000 mg | Freq: Once | INTRAVENOUS | Status: AC
  Administered 2023-03-16: 350 mg via INTRAVENOUS
  Filled 2023-03-16: qty 7

## 2023-03-16 NOTE — Patient Instructions (Signed)
 [facility] - A DEPT OF [facility]  Discharge Instructions: Thank you for choosing [facility] to provide your oncology and hematology care.   If you have a lab appointment with the Cancer Center, please go directly to the Cancer Center and check in at the registration area.   Wear comfortable clothing and clothing appropriate for easy access to any Portacath or PICC line.   We strive to give you quality time with your provider. You may need to reschedule your appointment if you arrive late (15 or more minutes).  Arriving late affects you and other patients whose appointments are after yours.  Also, if you miss three or more appointments without notifying the office, you may be dismissed from the clinic at the provider's discretion.      For prescription refill requests, have your pharmacy contact our office and allow 72 hours for refills to be completed.    Today you received the following chemotherapy and/or immunotherapy agents: Libtayo.       To help prevent nausea and vomiting after your treatment, we encourage you to take your nausea medication as directed.  BELOW ARE SYMPTOMS THAT SHOULD BE REPORTED IMMEDIATELY: *FEVER GREATER THAN 100.4 F (38 C) OR HIGHER *CHILLS OR SWEATING *NAUSEA AND VOMITING THAT IS NOT CONTROLLED WITH YOUR NAUSEA MEDICATION *UNUSUAL SHORTNESS OF BREATH *UNUSUAL BRUISING OR BLEEDING *URINARY PROBLEMS (pain or burning when urinating, or frequent urination) *BOWEL PROBLEMS (unusual diarrhea, constipation, pain near the anus) TENDERNESS IN MOUTH AND THROAT WITH OR WITHOUT PRESENCE OF ULCERS (sore throat, sores in mouth, or a toothache) UNUSUAL RASH, SWELLING OR PAIN  UNUSUAL VAGINAL DISCHARGE OR ITCHING   Items with * indicate a potential emergency and should be followed up as soon as possible or go to the Emergency Department if any problems should occur.  Please show the CHEMOTHERAPY ALERT CARD or IMMUNOTHERAPY  ALERT CARD at check-in to the Emergency Department and triage nurse.  Should you have questions after your visit or need to cancel or reschedule your appointment, please contact [facility] - A DEPT OF [facility]  Dept: [phone]  and follow the prompts.  Office hours are 8:00 a.m. to 4:30 p.m. Monday - Friday. Please note that voicemails left after 4:00 p.m. may not be returned until the following business day.  We are closed weekends and major holidays. You have access to a nurse at all times for urgent questions. Please call the main number to the clinic Dept: [phone] and follow the prompts.   For any non-urgent questions, you may also contact your provider using MyChart. We now offer e-Visits for anyone 79 and older to request care online for non-urgent symptoms. For details visit [email or web address].   Also download the MyChart app! Go to the app store, search "MyChart", open the app, select , and log in with your MyChart username and password.

## 2023-03-16 NOTE — Progress Notes (Signed)
[facility] [phone]   [phone]  PROGRESS NOTE  Patient Care Team: Garlan Fillers, MD as PCP - General (Internal Medicine) Garlan Fillers, MD as Consulting Physician (Internal Medicine) Exie Parody, MD (Hematology and Oncology) Bjorn Pippin, MD as Attending Physician (Urology) Mardella Layman, MD as Attending Physician (Gastroenterology) Janalyn Harder, MD (Inactive) as Consulting Physician (Dermatology) Lonie Peak, MD as Consulting Physician (Radiation Oncology) Malmfelt, Lise Auer, RN as Oncology Nurse Navigator (Oncology)  Hematological/Oncological History # Bone Lesions on CT/MRI # 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 Additionally there was noted to be a new enhancing lesion at the tip of the right greater trochanter measuring up to 1.7 cm also suspicious for metastatic disease. 09/27/2020: establish care with Dr. Leonides Schanz 10/29/2020: CT biopsy performed of left iliac bone lesion. Pathology showed bone and marrow without evidence of carcinoma.  11/11/2022: Cycle 1 Day 1 of cemplimab therapy.  12/03/2022: Cycle 2 Day 1 of cemplimab therapy.  12/23/2022: Cycle 3 Day 1 of cemplimab therapy.  01/13/2023: Cycle 4 Day 1 of cemplimab therapy.  02/03/2023: Cycle 5 Day 1 of cemplimab therapy.  02/23/2023: Cycle 6 Day 1 of cemplimab therapy.  03/16/2023:  Cycle 7 Day 1 of cemplimab therapy.   Interval History:  Martin Curry 89 y.o. male with medical history significant for bone lesions noted on CT/MRI who presents for a follow up visit. The patient's last visit was on 03/16/2023. In the interim since the last visit he has  completed Cycle 6 of cemiplimab.  On exam today Mr. Stepanski is accompanied by his daughter.  He reports his weight is increasing up to 183 pounds but he would prefer to be 179 pounds.  He notes that his appetite is good but he still has some difficulty with chewing due to the nerve damage on his face.  He reports that he notes that the discharge from his ear has increased slightly over the last 3 weeks but the wound on the backside of his neck is healing better.  He notes his major symptom after treatment is fatigue though he does have some occasional dry cough.  He also has some bouts of sneezing.  He notes his motivation can be low.  He has been drinking 64 ounces of water daily which he "meets or exceeds most of the time". Overall he feels well and is willing and able to proceed with immunotherapy at this time he denies any fevers, chills, sweats, nausea, vomiting or diarrhea.   A full 10 point ROS is listed below.   MEDICAL HISTORY:  Past Medical History:  Diagnosis Date   Atypical nevus 09/07/2002   left abdomen-slight   BCC (basal cell carcinoma of skin) 07/19/2014   left sideburn- +margin-exc.   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 if orignated from skin cancer   Nodular basal cell carcinoma (BCC) 11/21/2012   right side of scalp-CX35FU   Nodular basal cell carcinoma (BCC) 06/12/2014   left sideburn-CX35FU/exc.   Nodular basal cell carcinoma (BCC) 07/16/2015   left scalp-CX35FU   Polycythemia    Prostate cancer  (HCC) 1990's   total prostatectomy and adjuvant radiation. Last PSA was 0.02 in 09/2011   SCC (squamous cell carcinoma) 04/03/2009   forehead-txpbx   SCC (squamous cell carcinoma) 07/16/2015   left front scalp   SCC (squamous cell carcinoma) 07/16/2015   left front scalp-Cx35FU   SCCA (squamous cell carcinoma) of skin 12/20/2019   Right forehead (in situ)   SCCA (squamous cell carcinoma) of skin 05/27/2020   Mid Parietal Scalp (in situ)   Superficial basal cell carcinoma (BCC) 11/21/2012   behind left ear-CX35FU, midback-CX35FU, right forehead-CX35FU-exc   Superficial basal cell carcinoma (BCC) 11/02/2017   left post neck-CX35FU   Superficial nodular basal cell carcinoma (BCC) 12/20/2019   Left temporal scalp   Superficial nodular basal cell carcinoma (BCC) 05/27/2020   Right Preauricular area (curet and 5FU)    SURGICAL HISTORY: Past Surgical History:  Procedure Laterality Date   ADJACENT TISSUE TRANSFER/TISSUE REARRANGEMENT N/A 05/22/2019   Procedure: COMPLEX CLOSURE OF NECK WOUND;  Surgeon: Allena Napoleon, MD;  Location: [facility] OR;  Service: Plastics;  Laterality: N/A;   ALLOGRAFT APPLICATION N/A 05/22/2019   Procedure: POSSIBLE FACIAL NERVE RECONSTRUCTION WITH NERVE ALLOGRAFT VS AUTOGRAFT;  Surgeon: Allena Napoleon, MD;  Location: [facility] OR;  Service: Plastics;  Laterality: N/A;   BLADDER SURGERY     due to prostatectomy.    CATARACT EXTRACTION     right   COLONOSCOPY     neg in the past; due in 2014 with [place] Dr. Jarold Motto   EYE SURGERY     1950   HERNIA REPAIR     IR IMAGING GUIDED PORT INSERTION  11/30/2022   MOHS SURGERY     NECK DISSECTION  05/22/2019    NECK DISSECTION   PAROTIDECTOMY  05/22/2019   PAROTIDECTOMY with facial nerve dissection    PAROTIDECTOMY Right 05/22/2019   Procedure: PAROTIDECTOMY;  Surgeon: Serena Colonel, MD;  Location: [facility] OR;  Service: ENT;  Laterality: Right;   PROSTATE SURGERY     RADICAL NECK DISSECTION Right 05/22/2019   Procedure: RADICAL  NECK DISSECTION;  Surgeon: Serena Colonel, MD;  Location: [facility] OR;  Service: ENT;  Laterality: Right;   TONSILLECTOMY      SOCIAL HISTORY: Social History   Socioeconomic History   Marital status: Married    Spouse name: Not on file   Number of children: 3   Years of education: Not on file   Highest education level: Not on file  Occupational History    Comment: retired Scientist, [employer]; 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   Occupation: retired  Tobacco Use   Smoking status: Never   Smokeless tobacco: Never  Vaping Use   Vaping status: Never Used  Substance and Sexual Activity   Alcohol use: No   Drug use: No   Sexual activity: Not on file  Other Topics Concern   Not on file  Social History Narrative   Not on file   Social Drivers of Health   Financial Resource Strain: Not on file  Food Insecurity:  No Food Insecurity (10/29/2022)   Hunger Vital Sign    Worried About Running Out of Food in the Last Year: Never true    Ran Out of Food in the Last Year: Never true  Transportation Needs: No Transportation Needs (10/29/2022)   PRAPARE - Administrator, Civil Service (Medical): No    Lack of Transportation (Non-Medical): No  Physical Activity: Not on file  Stress: Not on file  Social Connections: Not on file  Intimate Partner Violence: Not At Risk (10/29/2022)   Humiliation, Afraid, Rape, and Kick questionnaire    Fear of Current or Ex-Partner: No    Emotionally Abused: No    Physically Abused: No    Sexually Abused: No    FAMILY HISTORY: Family History  Problem Relation Age of Onset   Uterine cancer Mother 50   Ovarian cancer Mother    Lung cancer Father 74   Prostate cancer Brother    Prostate cancer Brother    Colon cancer Neg Hx    Stomach cancer Neg Hx    Esophageal cancer Neg Hx    Rectal cancer Neg Hx     ALLERGIES:  is allergic to methylprednisolone, penicillins, latex, and tape.  MEDICATIONS:  Current Outpatient Medications  Medication Sig Dispense Refill    amLODipine (NORVASC) 5 MG tablet Take 5 mg by mouth daily.      aspirin 81 MG tablet Take 81 mg by mouth daily.     benazepril (LOTENSIN) 10 MG tablet Take 10 mg by mouth daily.     famotidine-calcium carbonate-magnesium hydroxide (PEPCID COMPLETE) 10-800-165 MG chewable tablet Take one tablet PRN     levothyroxine (SYNTHROID) 25 MCG tablet Take 25 mcg by mouth daily before breakfast.     lidocaine-prilocaine (EMLA) cream Apply 1 Application topically as needed. 30 g 0   Multiple Vitamin (MULTIVITAMIN) tablet Take 1 tablet by mouth daily.     ondansetron (ZOFRAN) 8 MG tablet Take 1 tablet (8 mg total) by mouth every 8 (eight) hours as needed for nausea or vomiting. (Patient not taking: Reported on 02/08/2023) 20 tablet 1   potassium citrate (UROCIT-K) 10 MEQ (1080 MG) SR tablet Take 10 mEq by mouth 3 (three) times daily with meals.     simvastatin (ZOCOR) 20 MG tablet Take 20 mg by mouth every evening.     No current facility-administered medications for this visit.   Facility-Administered Medications Ordered in Other Visits  Medication Dose Route Frequency Provider Last Rate Last Admin   sodium chloride flush (NS) 0.9 % injection 10 mL  10 mL Intracatheter PRN Jaci Standard, MD   10 mL at 03/16/23 1039    REVIEW OF SYSTEMS:   Constitutional: ( - ) fevers, ( - )  chills , ( - ) night sweats Eyes: ( - ) blurriness of vision, ( - ) double vision, ( - ) watery eyes Ears, nose, mouth, throat, and face: ( - ) mucositis, ( - ) sore throat Respiratory: ( - ) cough, ( - ) dyspnea, ( - ) wheezes Cardiovascular: ( - ) palpitation, ( - ) chest discomfort, ( - ) lower extremity swelling Gastrointestinal:  ( - ) nausea, ( - ) heartburn, ( - ) change in bowel habits Skin: ( - ) abnormal skin rashes Lymphatics: ( - ) new lymphadenopathy, ( - ) easy bruising Neurological: ( - ) numbness, ( - ) tingling, ( - ) new weaknesses Behavioral/Psych: ( - ) mood change, ( - )  new changes  All other systems  were reviewed with the patient and are negative.  PHYSICAL EXAMINATION: ECOG PERFORMANCE STATUS: 1 - Symptomatic but completely ambulatory  Vitals:   03/16/23 0848  BP: 130/66  Pulse: 64  Resp: 13  Temp: 98 F (36.7 C)  SpO2: 98%      Filed Weights   03/16/23 0848  Weight: 183 lb 8 oz (83.2 kg)      GENERAL: Well-appearing elderly Caucasian male, alert, no distress and comfortable SKIN: skin color, texture, turgor are normal, no rashes or significant lesions EYES: conjunctiva are pink and non-injected, sclera clear LUNGS: clear to auscultation and percussion with normal breathing effort HEART: regular rate & rhythm and no murmurs and no lower extremity edema Musculoskeletal: no cyanosis of digits and no clubbing  PSYCH: alert & oriented x 3, fluent speech NEURO: no focal motor/sensory deficits  LABORATORY DATA:  I have reviewed the data as listed    Latest Ref Rng & Units 03/16/2023    8:07 AM 02/23/2023    8:15 AM 02/03/2023    9:02 AM  CBC  WBC 4.0 - 10.5 K/uL 6.3  7.0  9.7   Hemoglobin 13.0 - 17.0 g/dL 60.1  09.3  23.5   Hematocrit 39.0 - 52.0 % 42.4  43.1  44.1   Platelets 150 - 400 K/uL 204  222  385        Latest Ref Rng & Units 03/16/2023    8:07 AM 02/23/2023    8:15 AM 02/03/2023    9:02 AM  CMP  Glucose 70 - 99 mg/dL 573  97  220   BUN 8 - 23 mg/dL 18  21  20    Creatinine 0.61 - 1.24 mg/dL 2.54  2.70  6.23   Sodium 135 - 145 mmol/L 141  138  137   Potassium 3.5 - 5.1 mmol/L 4.1  4.3  4.0   Chloride 98 - 111 mmol/L 109  107  106   CO2 22 - 32 mmol/L 26  26  24    Calcium 8.9 - 10.3 mg/dL 9.7  76.2  9.4   Total Protein 6.5 - 8.1 g/dL 6.5  6.8  6.9   Total Bilirubin 0.0 - 1.2 mg/dL 0.6  0.6  0.6   Alkaline Phos 38 - 126 U/L 75  82  74   AST 15 - 41 U/L 20  22  24    ALT 0 - 44 U/L 22  25  26      Lab Results  Component Value Date   MPROTEIN Not Observed 09/27/2020   Lab Results  Component Value Date   KPAFRELGTCHN 25.6 (H) 09/27/2020    LAMBDASER 23.0 09/27/2020   KAPLAMBRATIO 1.11 09/27/2020    RADIOGRAPHIC STUDIES: No results found.   ASSESSMENT & PLAN Martin Curry 89 y.o. 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  At this time the patient's clinical findings are complicated.  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  It is unclear if these 2 processes are the same or 2 separate primaries.  Additionally there is no clear evidence of a primary squamous cell carcinoma (however my concern is that the area behind the right ear may represent the same process).  Patient also does have PET avid areas of  his bowels which I do not suspect are related, but we will have evaluated further with EGD/colonoscopy as needed.  At this time the best treatment course would be to treat with Cemiplimab which should be effective at treating both basal cell carcinoma and squamous cell carcinoma, effectively treating both even if they are separate processes.  Overall he voices understanding of this complicated situation and our plan moving forward.  Of note radiation oncology and ENT are on board and willing to assist with management of his current findings.  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 Previously discussed with radiology who notes a repeat biopsy was not recommended and repeat imaging would be preferred.  --negative multiple myeloma labs with SPEP and serum free light chains showing no evidence of monoclonal gammopathy.  --Given his prior history of prostate cancer we ordered a PSA, which was normal. Repeated PSA previously, found to be within normal limits --CT C/A/P shows no evidence of disease elsewhere in  the body in August 2022. Most recent PET CT scan on 10/02/2022 showed hypermetabolic osseous metastasis with a dominant mass centered about the inferior medial portion of the right mastoid, suspicious for site of primary squamous cell carcinoma. -- PET CT scan shows evidence of lesion behind the right ear as well as the known pelvic metastasis. -- Reviewed tissue with pathology to determine if both lesions are the same pathology or different origins.  At this time findings are most consistent with a basal cell primary behind his ear and squamous cell carcinoma of the pelvis.  It is possible these at the same pathology with the basal/squamous crossover, though they may represent separate primaries.  Either way Cemiplimab therapy should be adequate to treat both. -- PD-L1 testing showed 0% PD-L1 score, however the use of cemiplimab does not require PD-L1 positivity (lack of PD-L1 prevents the use of pembrolizumab)  PLAN: --Proceed with cycle 7 day 1 of Cemiplimab immunotherapy today --Labs today show white blood cell count 6.3, hemoglobin 14.8, MCV 89.8, platelets 204 --PET CT scan on 01/25/2023 showed response to therapy as evidenced by decrease in FDG avidity within a destructive soft tissue mass involving the right mastoid as well as within osseous metastases.  Also previously noted hypermetabolic cecal mass, highly worrisome for colon carcinoma .  Discussed below.  Next PET CT scan in March 2025. -- Return to clinic for next Cycle of cemiplimab in 3 weeks time.   # Bleeding of the Ear--improved --  secondary due to erosion of the tumor into his ear canal. -- Bleeding has been improving with treatment. -- Will likely be treated by immunotherapy, however if it worsens could consider palliative radiation to the area in order to stanch the bleeding.  # FDG Avid Colon Lesion --Patient underwent colonoscopy with gastroenterology and found to have large polyps. -- No evidence of colonic  malignancy.  Orders Placed This Encounter  Procedures   CBC with Differential (Cancer Center Only)    Standing Status:   Future    Expected Date:   05/17/2023    Expiration Date:   05/16/2024   CMP (Cancer Center only)    Standing Status:   Future    Expected Date:   05/17/2023    Expiration Date:   05/16/2024   T4    Standing Status:   Future    Expected Date:   05/17/2023    Expiration Date:  05/16/2024   TSH    Standing Status:   Future    Expected Date:   05/17/2023    Expiration Date:   05/16/2024   CBC with Differential (Cancer Center Only)    Standing Status:   Future    Expected Date:   06/07/2023    Expiration Date:   06/06/2024   CMP (Cancer Center only)    Standing Status:   Future    Expected Date:   06/07/2023    Expiration Date:   06/06/2024   T4    Standing Status:   Future    Expected Date:   06/07/2023    Expiration Date:   06/06/2024   TSH    Standing Status:   Future    Expected Date:   06/07/2023    Expiration Date:   06/06/2024   CBC with Differential (Cancer Center Only)    Standing Status:   Future    Expected Date:   06/28/2023    Expiration Date:   06/27/2024   CMP (Cancer Center only)    Standing Status:   Future    Expected Date:   06/28/2023    Expiration Date:   06/27/2024   T4    Standing Status:   Future    Expected Date:   06/28/2023    Expiration Date:   06/27/2024   TSH    Standing Status:   Future    Expected Date:   06/28/2023    Expiration Date:   06/27/2024   CBC with Differential (Cancer Center Only)    Standing Status:   Future    Expected Date:   07/19/2023    Expiration Date:   07/18/2024   CMP (Cancer Center only)    Standing Status:   Future    Expected Date:   07/19/2023    Expiration Date:   07/18/2024   T4    Standing Status:   Future    Expected Date:   07/19/2023    Expiration Date:   07/18/2024   TSH    Standing Status:   Future    Expected Date:   07/19/2023    Expiration Date:   07/18/2024    All questions were answered. The patient  knows to call the clinic with any problems, questions or concerns.  A total of more than 30 minutes were spent on this encounter with face-to-face time and non-face-to-face time, including preparing to see the patient, ordering tests and/or medications, counseling the patient and coordination of care as outlined above.   Ulysees Barns, MD Department of Hematology/Oncology [facility] Phone: [phone] Pager: [phone] Email: [email or web address]  03/16/2023 10:49 AM

## 2023-03-17 LAB — T4: T4, Total: 8 ug/dL (ref 4.5–12.0)

## 2023-03-18 ENCOUNTER — Other Ambulatory Visit: Payer: Self-pay

## 2023-03-20 ENCOUNTER — Other Ambulatory Visit: Payer: Self-pay

## 2023-04-03 ENCOUNTER — Other Ambulatory Visit: Payer: Self-pay

## 2023-04-05 DIAGNOSIS — Z8546 Personal history of malignant neoplasm of prostate: Secondary | ICD-10-CM | POA: Diagnosis not present

## 2023-04-05 DIAGNOSIS — E785 Hyperlipidemia, unspecified: Secondary | ICD-10-CM | POA: Diagnosis not present

## 2023-04-05 DIAGNOSIS — E039 Hypothyroidism, unspecified: Secondary | ICD-10-CM | POA: Diagnosis not present

## 2023-04-06 ENCOUNTER — Ambulatory Visit: Payer: Medicare Other

## 2023-04-06 ENCOUNTER — Other Ambulatory Visit: Payer: Medicare Other

## 2023-04-06 ENCOUNTER — Ambulatory Visit: Payer: Medicare Other | Admitting: Hematology and Oncology

## 2023-04-06 NOTE — Progress Notes (Unsigned)
[facility] [phone]   Fax:([phone]  PROGRESS NOTE  Patient Care Team: Garlan Fillers, MD as PCP - General (Internal Medicine) Garlan Fillers, MD as Consulting Physician (Internal Medicine) Exie Parody, MD (Hematology and Oncology) Bjorn Pippin, MD as Attending Physician (Urology) Mardella Layman, MD as Attending Physician (Gastroenterology) Janalyn Harder, MD (Inactive) as Consulting Physician (Dermatology) Lonie Peak, MD as Consulting Physician (Radiation Oncology) Malmfelt, Lise Auer, RN as Oncology Nurse Navigator (Oncology)  Hematological/Oncological History # Bone Lesions on CT/MRI # 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 Additionally there was noted to be a new enhancing lesion at the tip of the right greater trochanter measuring up to 1.7 cm also suspicious for metastatic disease. 09/27/2020: establish care with Dr. Leonides Schanz 10/29/2020: CT biopsy performed of left iliac bone lesion. Pathology showed bone and marrow without evidence of carcinoma.  11/11/2022: Cycle 1 Day 1 of cemplimab therapy.  12/03/2022: Cycle 2 Day 1 of cemplimab therapy.  12/23/2022: Cycle 3 Day 1 of cemplimab therapy.  01/13/2023: Cycle 4 Day 1 of cemplimab therapy.  02/03/2023: Cycle 5 Day 1 of cemplimab therapy.  02/23/2023: Cycle 6 Day 1 of cemplimab therapy.  03/16/2023:  Cycle 7 Day 1 of cemplimab therapy.  04/06/2023: Cycle 8 Day 1 of cemplimab therapy.   Interval History:  Martin Curry 89 y.o. male with medical history significant for bone lesions noted on CT/MRI who presents for a follow up visit. The patient's last visit was on  03/16/2023. In the interim since the last visit he has completed Cycle 7 of cemiplimab.  On exam today Martin Curry is accompanied by his daughter.  He reports he has been well overall interim since our last visit.  He reports that he is not having major side effects as a result of his last treatment other than some occasional cough as well as fatigue.  He reports his fatigue is manageable and he is able to do his day-to-day activities.  He reports that has had overall good appetite and is doing his best to try drink 64 ounces of water per day.  He reports if he allowed himself he would have "6 meals per day".  He reports that he is not having any drainage from the wound on the back of his neck, but is still having yellowish discharge from his ear.  It is quite thick but appears to be leveling off.  Overall he feels well and is willing and able to proceed with immunotherapy at this time he denies any fevers, chills, sweats, nausea, vomiting or diarrhea.   A full 10 point ROS is listed below.   MEDICAL HISTORY:  Past Medical History:  Diagnosis Date   Atypical nevus 09/07/2002   left abdomen-slight   BCC (basal cell carcinoma of skin) 07/19/2014   left sideburn- +margin-exc.   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 if orignated from skin cancer   Nodular basal cell carcinoma (BCC) 11/21/2012   right side of scalp-CX35FU   Nodular basal cell carcinoma (BCC) 06/12/2014   left sideburn-CX35FU/exc.   Nodular basal cell carcinoma  (BCC) 07/16/2015   left scalp-CX35FU   Polycythemia    Prostate cancer (HCC) 1990's   total prostatectomy and adjuvant radiation. Last PSA was 0.02 in 09/2011   SCC (squamous cell carcinoma) 04/03/2009   forehead-txpbx   SCC (squamous cell carcinoma) 07/16/2015   left front scalp   SCC (squamous cell carcinoma) 07/16/2015   left front scalp-Cx35FU   SCCA (squamous cell carcinoma) of skin 12/20/2019   Right forehead (in situ)   SCCA (squamous cell carcinoma) of skin 05/27/2020   Mid Parietal Scalp (in situ)   Superficial basal cell carcinoma (BCC) 11/21/2012   behind left ear-CX35FU, midback-CX35FU, right forehead-CX35FU-exc   Superficial basal cell carcinoma (BCC) 11/02/2017   left post neck-CX35FU   Superficial nodular basal cell carcinoma (BCC) 12/20/2019   Left temporal scalp   Superficial nodular basal cell carcinoma (BCC) 05/27/2020   Right Preauricular area (curet and 5FU)    SURGICAL HISTORY: Past Surgical History:  Procedure Laterality Date   ADJACENT TISSUE TRANSFER/TISSUE REARRANGEMENT N/A 05/22/2019   Procedure: COMPLEX CLOSURE OF NECK WOUND;  Surgeon: Allena Napoleon, MD;  Location: [facility] OR;  Service: Plastics;  Laterality: N/A;   ALLOGRAFT APPLICATION N/A 05/22/2019   Procedure: POSSIBLE FACIAL NERVE RECONSTRUCTION WITH NERVE ALLOGRAFT VS AUTOGRAFT;  Surgeon: Allena Napoleon, MD;  Location: [facility] OR;  Service: Plastics;  Laterality: N/A;   BLADDER SURGERY     due to prostatectomy.    CATARACT EXTRACTION     right   COLONOSCOPY     neg in the past; due in 2014 with [place] Dr. Jarold Motto   EYE SURGERY     1950   HERNIA REPAIR     IR IMAGING GUIDED PORT INSERTION  11/30/2022   MOHS SURGERY     NECK DISSECTION  05/22/2019    NECK DISSECTION   PAROTIDECTOMY  05/22/2019   PAROTIDECTOMY with facial nerve dissection    PAROTIDECTOMY Right 05/22/2019   Procedure: PAROTIDECTOMY;  Surgeon: Serena Colonel, MD;  Location[facility];  Service: ENT;  Laterality: Right;   PROSTATE  SURGERY     RADICAL NECK DISSECTION Right 05/22/2019   Procedure: RADICAL NECK DISSECTION;  Surgeon: Serena Colonel, MD;  Location: [facility] OR;  Service: ENT;  Laterality: Right;   TONSILLECTOMY      SOCIAL HISTORY: Social History   Socioeconomic History   Marital status: Married    Spouse name: Not on file   Number of children: 3   Years of education: Not on file   Highest education level: Not on file  Occupational History    Comment: retired Scientist, [employer]; 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   Occupation: retired  Tobacco Use   Smoking status: Never   Smokeless tobacco: Never  Vaping Use   Vaping status: Never Used  Substance and Sexual Activity   Alcohol use: No   Drug use: No   Sexual activity: Not on file  Other Topics Concern   Not on file  Social History Narrative   Not on file   Social  Drivers of Corporate investment banker Strain: Not on file  Food Insecurity: No Food Insecurity (10/29/2022)   Hunger Vital Sign    Worried About Running Out of Food in the Last Year: Never true    Ran Out of Food in the Last Year: Never true  Transportation Needs: No Transportation Needs (10/29/2022)   PRAPARE - Administrator, Civil Service (Medical): No    Lack of Transportation (Non-Medical): No  Physical Activity: Not on file  Stress: Not on file  Social Connections: Not on file  Intimate Partner Violence: Not At Risk (10/29/2022)   Humiliation, Afraid, Rape, and Kick questionnaire    Fear of Current or Ex-Partner: No    Emotionally Abused: No    Physically Abused: No    Sexually Abused: No    FAMILY HISTORY: Family History  Problem Relation Age of Onset   Uterine cancer Mother 1   Ovarian cancer Mother    Lung cancer Father 56   Prostate cancer Brother    Prostate cancer Brother    Colon cancer Neg Hx    Stomach cancer Neg Hx    Esophageal cancer Neg Hx    Rectal cancer Neg Hx     ALLERGIES:  is allergic to methylprednisolone, penicillins, latex, and tape.  MEDICATIONS:   Current Outpatient Medications  Medication Sig Dispense Refill   amLODipine (NORVASC) 5 MG tablet Take 5 mg by mouth daily.      aspirin 81 MG tablet Take 81 mg by mouth daily.     benazepril (LOTENSIN) 10 MG tablet Take 10 mg by mouth daily.     famotidine-calcium carbonate-magnesium hydroxide (PEPCID COMPLETE) 10-800-165 MG chewable tablet Take one tablet PRN     levothyroxine (SYNTHROID) 25 MCG tablet Take 25 mcg by mouth daily before breakfast.     lidocaine-prilocaine (EMLA) cream Apply 1 Application topically as needed. 30 g 0   Multiple Vitamin (MULTIVITAMIN) tablet Take 1 tablet by mouth daily.     ondansetron (ZOFRAN) 8 MG tablet Take 1 tablet (8 mg total) by mouth every 8 (eight) hours as needed for nausea or vomiting. (Patient not taking: Reported on 02/08/2023) 20 tablet 1   potassium citrate (UROCIT-K) 10 MEQ (1080 MG) SR tablet Take 10 mEq by mouth 3 (three) times daily with meals.     simvastatin (ZOCOR) 20 MG tablet Take 20 mg by mouth every evening.     No current facility-administered medications for this visit.   Facility-Administered Medications Ordered in Other Visits  Medication Dose Route Frequency Provider Last Rate Last Admin   sodium chloride flush (NS) 0.9 % injection 10 mL  10 mL Intracatheter PRN Jaci Standard, MD   10 mL at 04/07/23 0957    REVIEW OF SYSTEMS:   Constitutional: ( - ) fevers, ( - )  chills , ( - ) night sweats Eyes: ( - ) blurriness of vision, ( - ) double vision, ( - ) watery eyes Ears, nose, mouth, throat, and face: ( - ) mucositis, ( - ) sore throat Respiratory: ( - ) cough, ( - ) dyspnea, ( - ) wheezes Cardiovascular: ( - ) palpitation, ( - ) chest discomfort, ( - ) lower extremity swelling Gastrointestinal:  ( - ) nausea, ( - ) heartburn, ( - ) change in bowel habits Skin: ( - ) abnormal skin rashes Lymphatics: ( - ) new lymphadenopathy, ( - ) easy bruising Neurological: ( - ) numbness, ( - ) tingling, ( - )  new  weaknesses Behavioral/Psych: ( - ) mood change, ( - ) new changes  All other systems were reviewed with the patient and are negative.  PHYSICAL EXAMINATION: ECOG PERFORMANCE STATUS: 1 - Symptomatic but completely ambulatory  Vitals:   04/07/23 1036  BP: (!) 147/75  Pulse: 67  Resp: 13  Temp: 97.6 F (36.4 C)  SpO2: 98%       Filed Weights   04/07/23 1036  Weight: 186 lb 12.8 oz (84.7 kg)       GENERAL: Well-appearing elderly Caucasian male, alert, no distress and comfortable SKIN: skin color, texture, turgor are normal, no rashes or significant lesions EYES: conjunctiva are pink and non-injected, sclera clear LUNGS: clear to auscultation and percussion with normal breathing effort HEART: regular rate & rhythm and no murmurs and no lower extremity edema Musculoskeletal: no cyanosis of digits and no clubbing  PSYCH: alert & oriented x 3, fluent speech NEURO: no focal motor/sensory deficits  LABORATORY DATA:  I have reviewed the data as listed    Latest Ref Rng & Units 04/07/2023    9:58 AM 03/16/2023    8:07 AM 02/23/2023    8:15 AM  CBC  WBC 4.0 - 10.5 K/uL 6.5  6.3  7.0   Hemoglobin 13.0 - 17.0 g/dL 16.1  09.6  04.5   Hematocrit 39.0 - 52.0 % 42.6  42.4  43.1   Platelets 150 - 400 K/uL 231  204  222        Latest Ref Rng & Units 04/07/2023    9:58 AM 03/16/2023    8:07 AM 02/23/2023    8:15 AM  CMP  Glucose 70 - 99 mg/dL 409  811  97   BUN 8 - 23 mg/dL 17  18  21    Creatinine 0.61 - 1.24 mg/dL 9.14  7.82  9.56   Sodium 135 - 145 mmol/L 140  141  138   Potassium 3.5 - 5.1 mmol/L 4.1  4.1  4.3   Chloride 98 - 111 mmol/L 107  109  107   CO2 22 - 32 mmol/L 28  26  26    Calcium 8.9 - 10.3 mg/dL 9.7  9.7  21.3   Total Protein 6.5 - 8.1 g/dL 6.7  6.5  6.8   Total Bilirubin 0.0 - 1.2 mg/dL 0.6  0.6  0.6   Alkaline Phos 38 - 126 U/L 73  75  82   AST 15 - 41 U/L 20  20  22    ALT 0 - 44 U/L 22  22  25      Lab Results  Component Value Date   MPROTEIN Not  Observed 09/27/2020   Lab Results  Component Value Date   KPAFRELGTCHN 25.6 (H) 09/27/2020   LAMBDASER 23.0 09/27/2020   KAPLAMBRATIO 1.11 09/27/2020    RADIOGRAPHIC STUDIES: No results found.   ASSESSMENT & PLAN Martin Curry 89 y.o. 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  At this time the patient's clinical findings are complicated.  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  It is unclear if these 2 processes are the same or 2 separate primaries.  Additionally there is no clear evidence of a primary squamous cell carcinoma (however my concern is that the area behind the right ear  may represent the same process).  Patient also does have PET avid areas of his bowels which I do not suspect are related, but we will have evaluated further with EGD/colonoscopy as needed.  At this time the best treatment course would be to treat with Cemiplimab which should be effective at treating both basal cell carcinoma and squamous cell carcinoma, effectively treating both even if they are separate processes.  Overall he voices understanding of this complicated situation and our plan moving forward.  Of note radiation oncology and ENT are on board and willing to assist with management of his current findings.  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 Previously discussed with radiology who notes a repeat biopsy was not recommended and repeat imaging would be preferred.  --negative multiple myeloma labs with SPEP and serum free light chains showing no evidence of monoclonal gammopathy.  --Given his prior history of prostate cancer we ordered a PSA, which was normal. Repeated PSA  previously, found to be within normal limits --CT C/A/P shows no evidence of disease elsewhere in the body in August 2022. Most recent PET CT scan on 10/02/2022 showed hypermetabolic osseous metastasis with a dominant mass centered about the inferior medial portion of the right mastoid, suspicious for site of primary squamous cell carcinoma. -- PET CT scan shows evidence of lesion behind the right ear as well as the known pelvic metastasis. -- Reviewed tissue with pathology to determine if both lesions are the same pathology or different origins.  At this time findings are most consistent with a basal cell primary behind his ear and squamous cell carcinoma of the pelvis.  It is possible these at the same pathology with the basal/squamous crossover, though they may represent separate primaries.  Either way Cemiplimab therapy should be adequate to treat both. -- PD-L1 testing showed 0% PD-L1 score, however the use of cemiplimab does not require PD-L1 positivity (lack of PD-L1 prevents the use of pembrolizumab)  PLAN: --Proceed with cycle 8 day 1 of Cemiplimab immunotherapy today --Labs today show white blood cell count 6.5, Hgb 14.9, MCV 90.3, Plt 231  --PET CT scan on 01/25/2023 showed response to therapy as evidenced by decrease in FDG avidity within a destructive soft tissue mass involving the right mastoid as well as within osseous metastases.  Also previously noted hypermetabolic cecal mass, highly worrisome for colon carcinoma .  Discussed below.  Next PET CT scan in March 2025. -- Return to clinic for next Cycle of cemiplimab in 3 weeks time.   # Bleeding of the Ear--stable --  secondary due to erosion of the tumor into his ear canal. -- Bleeding has been improving with treatment. -- Will likely be treated by immunotherapy, however if it worsens could consider palliative radiation to the area in order to stanch the bleeding.  # FDG Avid Colon Lesion --Patient underwent colonoscopy with  gastroenterology and found to have large polyps. -- No evidence of colonic malignancy.  Orders Placed This Encounter  Procedures   NM PET Image Restag (PS) Skull Base To Thigh    Standing Status:   Future    Expected Date:   05/05/2023    Expiration Date:   04/06/2024    If indicated for the ordered procedure, I authorize the administration of a radiopharmaceutical per Radiology protocol:   Yes    Preferred imaging location?:   [facility]    All  questions were answered. The patient knows to call the clinic with any problems, questions or concerns.  A total of more than 30 minutes were spent on this encounter with face-to-face time and non-face-to-face time, including preparing to see the patient, ordering tests and/or medications, counseling the patient and coordination of care as outlined above.   Ulysees Barns, MD Department of Hematology/Oncology [facility] at [facility] Phone: [phone] Pager: [phone] Email: [email or web address]  04/07/2023 10:58 AM

## 2023-04-07 ENCOUNTER — Other Ambulatory Visit: Payer: Self-pay | Admitting: *Deleted

## 2023-04-07 ENCOUNTER — Inpatient Hospital Stay: Payer: Medicare Other

## 2023-04-07 ENCOUNTER — Inpatient Hospital Stay: Payer: Medicare Other | Attending: Hematology and Oncology

## 2023-04-07 ENCOUNTER — Inpatient Hospital Stay: Payer: Medicare Other | Admitting: Hematology and Oncology

## 2023-04-07 VITALS — BP 147/75 | HR 67 | Temp 97.6°F | Resp 13 | Wt 186.8 lb

## 2023-04-07 DIAGNOSIS — Z95828 Presence of other vascular implants and grafts: Secondary | ICD-10-CM

## 2023-04-07 DIAGNOSIS — Z79620 Long term (current) use of immunosuppressive biologic: Secondary | ICD-10-CM | POA: Diagnosis not present

## 2023-04-07 DIAGNOSIS — C4492 Squamous cell carcinoma of skin, unspecified: Secondary | ICD-10-CM

## 2023-04-07 DIAGNOSIS — C7951 Secondary malignant neoplasm of bone: Secondary | ICD-10-CM

## 2023-04-07 DIAGNOSIS — C4441 Basal cell carcinoma of skin of scalp and neck: Secondary | ICD-10-CM | POA: Diagnosis not present

## 2023-04-07 DIAGNOSIS — Z5112 Encounter for antineoplastic immunotherapy: Secondary | ICD-10-CM | POA: Diagnosis not present

## 2023-04-07 LAB — CMP (CANCER CENTER ONLY)
ALT: 22 U/L (ref 0–44)
AST: 20 U/L (ref 15–41)
Albumin: 4.1 g/dL (ref 3.5–5.0)
Alkaline Phosphatase: 73 U/L (ref 38–126)
Anion gap: 5 (ref 5–15)
BUN: 17 mg/dL (ref 8–23)
CO2: 28 mmol/L (ref 22–32)
Calcium: 9.7 mg/dL (ref 8.9–10.3)
Chloride: 107 mmol/L (ref 98–111)
Creatinine: 1.33 mg/dL — ABNORMAL HIGH (ref 0.61–1.24)
GFR, Estimated: 51 mL/min — ABNORMAL LOW (ref >60)
Glucose, Bld: 145 mg/dL — ABNORMAL HIGH (ref 70–99)
Potassium: 4.1 mmol/L (ref 3.5–5.1)
Sodium: 140 mmol/L (ref 135–145)
Total Bilirubin: 0.6 mg/dL (ref 0.0–1.2)
Total Protein: 6.7 g/dL (ref 6.5–8.1)

## 2023-04-07 LAB — CBC WITH DIFFERENTIAL (CANCER CENTER ONLY)
Abs Immature Granulocytes: 0.1 10*3/uL — ABNORMAL HIGH (ref 0.00–0.07)
Basophils Absolute: 0 10*3/uL (ref 0.0–0.1)
Basophils Relative: 1 %
Eosinophils Absolute: 0.1 10*3/uL (ref 0.0–0.5)
Eosinophils Relative: 2 %
HCT: 42.6 % (ref 39.0–52.0)
Hemoglobin: 14.9 g/dL (ref 13.0–17.0)
Immature Granulocytes: 2 %
Lymphocytes Relative: 16 %
Lymphs Abs: 1.1 10*3/uL (ref 0.7–4.0)
MCH: 31.6 pg (ref 26.0–34.0)
MCHC: 35 g/dL (ref 30.0–36.0)
MCV: 90.3 fL (ref 80.0–100.0)
Monocytes Absolute: 0.6 10*3/uL (ref 0.1–1.0)
Monocytes Relative: 9 %
Neutro Abs: 4.6 10*3/uL (ref 1.7–7.7)
Neutrophils Relative %: 70 %
Platelet Count: 231 10*3/uL (ref 150–400)
RBC: 4.72 MIL/uL (ref 4.22–5.81)
RDW: 13.2 % (ref 11.5–15.5)
WBC Count: 6.5 10*3/uL (ref 4.0–10.5)
nRBC: 0 % (ref 0.0–0.2)

## 2023-04-07 LAB — TSH: TSH: 3.569 u[IU]/mL (ref 0.350–4.500)

## 2023-04-07 MED ORDER — SODIUM CHLORIDE 0.9% FLUSH
10.0000 mL | INTRAVENOUS | Status: DC | PRN
  Administered 2023-04-07: 10 mL

## 2023-04-07 MED ORDER — SODIUM CHLORIDE 0.9 % IV SOLN: Freq: Once | INTRAVENOUS | Status: AC

## 2023-04-07 MED ORDER — HEPARIN SOD (PORK) LOCK FLUSH 100 UNIT/ML IV SOLN
500.0000 [IU] | Freq: Once | INTRAVENOUS | Status: AC | PRN
  Administered 2023-04-07: 500 [IU]

## 2023-04-07 MED ORDER — SODIUM CHLORIDE 0.9 % IV SOLN
350.0000 mg | Freq: Once | INTRAVENOUS | Status: AC
  Administered 2023-04-07: 350 mg via INTRAVENOUS
  Filled 2023-04-07: qty 7

## 2023-04-07 MED ORDER — LIDOCAINE-PRILOCAINE 2.5-2.5 % EX CREA: 1.0000 | TOPICAL_CREAM | CUTANEOUS | 0 refills | Status: DC | PRN

## 2023-04-07 NOTE — Patient Instructions (Signed)
[facility] - A DEPT OF [facility]  Discharge Instructions: Thank you for choosing [facility] to provide your oncology and hematology care.   If you have a lab appointment with the Cancer Center, please go directly to the Cancer Center and check in at the registration area.   Wear comfortable clothing and clothing appropriate for easy access to any Portacath or PICC line.   We strive to give you quality time with your provider. You may need to reschedule your appointment if you arrive late (15 or more minutes).  Arriving late affects you and other patients whose appointments are after yours.  Also, if you miss three or more appointments without notifying the office, you may be dismissed from the clinic at the provider's discretion.      For prescription refill requests, have your pharmacy contact our office and allow 72 hours for refills to be completed.    Today you received the following chemotherapy and/or immunotherapy agents: Libtayo.       To help prevent nausea and vomiting after your treatment, we encourage you to take your nausea medication as directed.  BELOW ARE SYMPTOMS THAT SHOULD BE REPORTED IMMEDIATELY: *FEVER GREATER THAN 100.4 F (38 C) OR HIGHER *CHILLS OR SWEATING *NAUSEA AND VOMITING THAT IS NOT CONTROLLED WITH YOUR NAUSEA MEDICATION *UNUSUAL SHORTNESS OF BREATH *UNUSUAL BRUISING OR BLEEDING *URINARY PROBLEMS (pain or burning when urinating, or frequent urination) *BOWEL PROBLEMS (unusual diarrhea, constipation, pain near the anus) TENDERNESS IN MOUTH AND THROAT WITH OR WITHOUT PRESENCE OF ULCERS (sore throat, sores in mouth, or a toothache) UNUSUAL RASH, SWELLING OR PAIN  UNUSUAL VAGINAL DISCHARGE OR ITCHING   Items with * indicate a potential emergency and should be followed up as soon as possible or go to the Emergency Department if any problems should occur.  Please show the CHEMOTHERAPY ALERT CARD or IMMUNOTHERAPY  ALERT CARD at check-in to the Emergency Department and triage nurse.  Should you have questions after your visit or need to cancel or reschedule your appointment, please contact [facility] - A DEPT OF [facility]  Dept: [phone]  and follow the prompts.  Office hours are 8:00 a.m. to 4:30 p.m. Monday - Friday. Please note that voicemails left after 4:00 p.m. may not be returned until the following business day.  We are closed weekends and major holidays. You have access to a nurse at all times for urgent questions. Please call the main number to the clinic Dept: [phone] and follow the prompts.   For any non-urgent questions, you may also contact your provider using MyChart. We now offer e-Visits for anyone 71 and older to request care online for non-urgent symptoms. For details visit [email or web address].   Also download the MyChart app! Go to the app store, search "MyChart", open the app, select [facility], and log in with your MyChart username and password.

## 2023-04-09 LAB — T4: T4, Total: 8.1 ug/dL (ref 4.5–12.0)

## 2023-04-12 ENCOUNTER — Encounter: Payer: Self-pay | Admitting: Internal Medicine

## 2023-04-12 DIAGNOSIS — R82998 Other abnormal findings in urine: Secondary | ICD-10-CM | POA: Diagnosis not present

## 2023-04-12 DIAGNOSIS — Z0000 Encounter for general adult medical examination without abnormal findings: Secondary | ICD-10-CM | POA: Diagnosis not present

## 2023-04-12 DIAGNOSIS — N1831 Chronic kidney disease, stage 3a: Secondary | ICD-10-CM | POA: Diagnosis not present

## 2023-04-27 ENCOUNTER — Ambulatory Visit: Payer: Medicare Other

## 2023-04-27 ENCOUNTER — Other Ambulatory Visit: Payer: Medicare Other

## 2023-04-27 ENCOUNTER — Ambulatory Visit: Payer: Medicare Other | Admitting: Hematology and Oncology

## 2023-04-28 ENCOUNTER — Inpatient Hospital Stay: Payer: Medicare Other | Attending: Hematology and Oncology

## 2023-04-28 ENCOUNTER — Encounter: Payer: Self-pay | Admitting: Hematology and Oncology

## 2023-04-28 ENCOUNTER — Inpatient Hospital Stay: Payer: Medicare Other

## 2023-04-28 ENCOUNTER — Inpatient Hospital Stay: Payer: Medicare Other | Attending: Hematology and Oncology | Admitting: Physician Assistant

## 2023-04-28 VITALS — BP 134/83 | HR 64 | Temp 98.0°F | Resp 13 | Wt 186.6 lb

## 2023-04-28 DIAGNOSIS — Z79620 Long term (current) use of immunosuppressive biologic: Secondary | ICD-10-CM | POA: Insufficient documentation

## 2023-04-28 DIAGNOSIS — C4441 Basal cell carcinoma of skin of scalp and neck: Secondary | ICD-10-CM | POA: Insufficient documentation

## 2023-04-28 DIAGNOSIS — C7951 Secondary malignant neoplasm of bone: Secondary | ICD-10-CM

## 2023-04-28 DIAGNOSIS — Z5112 Encounter for antineoplastic immunotherapy: Secondary | ICD-10-CM | POA: Insufficient documentation

## 2023-04-28 DIAGNOSIS — C4492 Squamous cell carcinoma of skin, unspecified: Secondary | ICD-10-CM | POA: Diagnosis not present

## 2023-04-28 DIAGNOSIS — Z95828 Presence of other vascular implants and grafts: Secondary | ICD-10-CM

## 2023-04-28 LAB — CBC WITH DIFFERENTIAL (CANCER CENTER ONLY)
Abs Immature Granulocytes: 0.04 10*3/uL (ref 0.00–0.07)
Basophils Absolute: 0 10*3/uL (ref 0.0–0.1)
Basophils Relative: 1 %
Eosinophils Absolute: 0.2 10*3/uL (ref 0.0–0.5)
Eosinophils Relative: 3 %
HCT: 44.7 % (ref 39.0–52.0)
Hemoglobin: 15.3 g/dL (ref 13.0–17.0)
Immature Granulocytes: 1 %
Lymphocytes Relative: 19 %
Lymphs Abs: 1.3 10*3/uL (ref 0.7–4.0)
MCH: 31.4 pg (ref 26.0–34.0)
MCHC: 34.2 g/dL (ref 30.0–36.0)
MCV: 91.6 fL (ref 80.0–100.0)
Monocytes Absolute: 0.7 10*3/uL (ref 0.1–1.0)
Monocytes Relative: 9 %
Neutro Abs: 4.7 10*3/uL (ref 1.7–7.7)
Neutrophils Relative %: 67 %
Platelet Count: 219 10*3/uL (ref 150–400)
RBC: 4.88 MIL/uL (ref 4.22–5.81)
RDW: 13.4 % (ref 11.5–15.5)
WBC Count: 7 10*3/uL (ref 4.0–10.5)
nRBC: 0 % (ref 0.0–0.2)

## 2023-04-28 LAB — CMP (CANCER CENTER ONLY)
ALT: 20 U/L (ref 0–44)
AST: 19 U/L (ref 15–41)
Albumin: 4.2 g/dL (ref 3.5–5.0)
Alkaline Phosphatase: 76 U/L (ref 38–126)
Anion gap: 5 (ref 5–15)
BUN: 19 mg/dL (ref 8–23)
CO2: 26 mmol/L (ref 22–32)
Calcium: 9.7 mg/dL (ref 8.9–10.3)
Chloride: 109 mmol/L (ref 98–111)
Creatinine: 1.3 mg/dL — ABNORMAL HIGH (ref 0.61–1.24)
GFR, Estimated: 53 mL/min — ABNORMAL LOW (ref >60)
Glucose, Bld: 101 mg/dL — ABNORMAL HIGH (ref 70–99)
Potassium: 4.2 mmol/L (ref 3.5–5.1)
Sodium: 140 mmol/L (ref 135–145)
Total Bilirubin: 0.6 mg/dL (ref 0.0–1.2)
Total Protein: 6.9 g/dL (ref 6.5–8.1)

## 2023-04-28 LAB — TSH: TSH: 3.721 u[IU]/mL (ref 0.350–4.500)

## 2023-04-28 MED ORDER — SODIUM CHLORIDE 0.9% FLUSH
10.0000 mL | INTRAVENOUS | Status: DC | PRN
  Administered 2023-04-28: 10 mL

## 2023-04-28 MED ORDER — SODIUM CHLORIDE 0.9 % IV SOLN
350.0000 mg | Freq: Once | INTRAVENOUS | Status: AC
  Administered 2023-04-28: 350 mg via INTRAVENOUS
  Filled 2023-04-28: qty 7

## 2023-04-28 MED ORDER — SODIUM CHLORIDE 0.9 % IV SOLN: Freq: Once | INTRAVENOUS | Status: AC

## 2023-04-28 MED ORDER — HEPARIN SOD (PORK) LOCK FLUSH 100 UNIT/ML IV SOLN
500.0000 [IU] | Freq: Once | INTRAVENOUS | Status: AC | PRN
  Administered 2023-04-28: 500 [IU]

## 2023-04-28 NOTE — Progress Notes (Signed)
 [facility] [phone]   [phone]  PROGRESS NOTE  Patient Care Team: Garlan Fillers, MD as PCP - General (Internal Medicine) Garlan Fillers, MD as Consulting Physician (Internal Medicine) Exie Parody, MD (Hematology and Oncology) Bjorn Pippin, MD as Attending Physician (Urology) Mardella Layman, MD as Attending Physician (Gastroenterology) Janalyn Harder, MD (Inactive) as Consulting Physician (Dermatology) Lonie Peak, MD as Consulting Physician (Radiation Oncology) Malmfelt, Lise Auer, RN as Oncology Nurse Navigator (Oncology)  Hematological/Oncological History # Bone Lesions on CT/MRI # 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 Additionally there was noted to be a new enhancing lesion at the tip of the right greater trochanter measuring up to 1.7 cm also suspicious for metastatic disease. 09/27/2020: establish care with Dr. Leonides Schanz 10/29/2020: CT biopsy performed of left iliac bone lesion. Pathology showed bone and marrow without evidence of carcinoma.  11/11/2022: Cycle 1 Day 1 of cemplimab therapy.  12/03/2022: Cycle 2 Day 1 of cemplimab therapy.  12/23/2022: Cycle 3 Day 1 of cemplimab therapy.  01/13/2023: Cycle 4 Day 1 of cemplimab therapy.  02/03/2023: Cycle 5 Day 1 of cemplimab therapy.  02/23/2023: Cycle 6 Day 1 of cemplimab therapy.  03/16/2023:  Cycle 7 Day 1 of cemplimab therapy.  04/06/2023: Cycle 8 Day 1 of cemplimab therapy.  04/28/2023: Cycle 9 Day 1 of cemplimab therapy.   Interval History:  Martin Curry 89 y.o. 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 The patient's last visit was on 04/07/2023. In the interim since the last visit he has completed Cycle 9 of cemiplimab.  On exam today Martin Curry is accompanied by his daughter.  He reports he is tolerating immunotherapy without any new toxicities. His energy is fair and he is trying to exercising regularly. His goal is to mow the lawn this summer. He has a great appetite without any weight changes. He denies nausea, vomiting or abdominal pain. His bowel habits are regular without recurrent episodes of diarrhea or constipation. He denies easy bruising or signs of bleeding. He reports the wound behind his left ear has healed completely and no longer bleeding. Overall he feels well and is willing and able to proceed with immunotherapy at this time he denies any fevers, chills, sweats, shortness of breath, chest pain or cough.   A full 10 point ROS is listed below.   MEDICAL HISTORY:  Past Medical History:  Diagnosis Date   Atypical nevus 09/07/2002   left abdomen-slight   BCC (basal cell carcinoma of skin) 07/19/2014   left sideburn- +margin-exc.   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)    ? if orignated from skin cancer   Nodular basal cell carcinoma (BCC) 11/21/2012   right side of scalp-CX35FU   Nodular basal cell carcinoma (BCC) 06/12/2014   left sideburn-CX35FU/exc.   Nodular basal cell carcinoma (BCC) 07/16/2015   left scalp-CX35FU   Polycythemia     Prostate cancer (HCC) 1990's   total prostatectomy and adjuvant radiation. Last PSA was 0.02 in 09/2011   SCC (squamous cell carcinoma) 04/03/2009   forehead-txpbx   SCC (squamous cell carcinoma) 07/16/2015   left front scalp   SCC (squamous cell carcinoma) 07/16/2015   left front scalp-Cx35FU   SCCA (squamous cell carcinoma) of skin 12/20/2019   Right forehead (in situ)   SCCA (squamous cell carcinoma) of skin 05/27/2020   Mid Parietal Scalp (in situ)   Superficial basal cell carcinoma (BCC) 11/21/2012   behind left ear-CX35FU, midback-CX35FU, right forehead-CX35FU-exc   Superficial basal cell carcinoma (BCC) 11/02/2017   left post neck-CX35FU   Superficial nodular basal cell carcinoma (BCC) 12/20/2019   Left temporal scalp   Superficial nodular basal cell carcinoma (BCC) 05/27/2020   Right Preauricular area (curet and 5FU)    SURGICAL HISTORY: Past Surgical History:  Procedure Laterality Date   ADJACENT TISSUE TRANSFER/TISSUE REARRANGEMENT N/A 05/22/2019   Procedure: COMPLEX CLOSURE OF NECK WOUND;  Surgeon: Allena Napoleon, MD;  Location: [facility] OR;  Service: Plastics;  Laterality: N/A;   ALLOGRAFT APPLICATION N/A 05/22/2019   Procedure: POSSIBLE FACIAL NERVE RECONSTRUCTION WITH NERVE ALLOGRAFT VS AUTOGRAFT;  Surgeon: Allena Napoleon, MD;  Location: [facility] OR;  Service: Plastics;  Laterality: N/A;   BLADDER SURGERY     due to prostatectomy.    CATARACT EXTRACTION     right   COLONOSCOPY     neg in the past; due in 2014 with [place] Dr. Jarold Motto   EYE SURGERY     1950   HERNIA REPAIR     IR IMAGING GUIDED PORT INSERTION  11/30/2022   MOHS SURGERY     NECK DISSECTION  05/22/2019    NECK DISSECTION   PAROTIDECTOMY  05/22/2019   PAROTIDECTOMY with facial nerve dissection    PAROTIDECTOMY Right 05/22/2019   Procedure: PAROTIDECTOMY;  Surgeon: Serena Colonel, MD;  Location[facility];  Service: ENT;  Laterality: Right;   PROSTATE SURGERY     RADICAL NECK DISSECTION Right 05/22/2019    Procedure: RADICAL NECK DISSECTION;  Surgeon: Serena Colonel, MD;  Location: [facility] OR;  Service: ENT;  Laterality: Right;   TONSILLECTOMY      SOCIAL HISTORY: Social History   Socioeconomic History   Marital status: Married    Spouse name: Not on file   Number of children: 3   Years of education: Not on file   Highest education level: Not on file  Occupational History    Comment: retired Scientist, 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   Occupation: retired  Tobacco Use   Smoking status: Never   Smokeless tobacco: Never  Vaping Use   Vaping status: Never Used  Substance and Sexual Activity   Alcohol use: No   Drug use: No   Sexual activity: Not on file  Other Topics Concern   Not on file  Social History Narrative   Not on file   Social Drivers of Health   Financial Resource Strain: Not on file  Food Insecurity: No Food Insecurity (10/29/2022)   Hunger Vital Sign    Worried About Running Out  of Food in the Last Year: Never true    Ran Out of Food in the Last Year: Never true  Transportation Needs: No Transportation Needs (10/29/2022)   PRAPARE - Administrator, Civil Service (Medical): No    Lack of Transportation (Non-Medical): No  Physical Activity: Not on file  Stress: Not on file  Social Connections: Not on file  Intimate Partner Violence: Not At Risk (10/29/2022)   Humiliation, Afraid, Rape, and Kick questionnaire    Fear of Current or Ex-Partner: No    Emotionally Abused: No    Physically Abused: No    Sexually Abused: No    FAMILY HISTORY: Family History  Problem Relation Age of Onset   Uterine cancer Mother 24   Ovarian cancer Mother    Lung cancer Father 22   Prostate cancer Brother    Prostate cancer Brother    Colon cancer Neg Hx    Stomach cancer Neg Hx    Esophageal cancer Neg Hx    Rectal cancer Neg Hx     ALLERGIES:  is allergic to methylprednisolone, penicillins, latex, and tape.  MEDICATIONS:  Current Outpatient Medications  Medication Sig  Dispense Refill   amLODipine (NORVASC) 5 MG tablet Take 5 mg by mouth daily.      aspirin 81 MG tablet Take 81 mg by mouth daily.     benazepril (LOTENSIN) 10 MG tablet Take 10 mg by mouth daily.     famotidine-calcium carbonate-magnesium hydroxide (PEPCID COMPLETE) 10-800-165 MG chewable tablet Take one tablet PRN     levothyroxine (SYNTHROID) 25 MCG tablet Take 25 mcg by mouth daily before breakfast. 2 tABS M,W,F and one the other days     lidocaine-prilocaine (EMLA) cream Apply 1 Application topically as needed. 30 g 0   Multiple Vitamin (MULTIVITAMIN) tablet Take 1 tablet by mouth daily.     potassium citrate (UROCIT-K) 10 MEQ (1080 MG) SR tablet Take 10 mEq by mouth 3 (three) times daily with meals.     simvastatin (ZOCOR) 20 MG tablet Take 20 mg by mouth every evening.     ondansetron (ZOFRAN) 8 MG tablet Take 1 tablet (8 mg total) by mouth every 8 (eight) hours as needed for nausea or vomiting. (Patient not taking: Reported on 04/28/2023) 20 tablet 1   No current facility-administered medications for this visit.    REVIEW OF SYSTEMS:   Constitutional: ( - ) fevers, ( - )  chills , ( - ) night sweats Eyes: ( - ) blurriness of vision, ( - ) double vision, ( - ) watery eyes Ears, nose, mouth, throat, and face: ( - ) mucositis, ( - ) sore throat Respiratory: ( - ) cough, ( - ) dyspnea, ( - ) wheezes Cardiovascular: ( - ) palpitation, ( - ) chest discomfort, ( - ) lower extremity swelling Gastrointestinal:  ( - ) nausea, ( - ) heartburn, ( - ) change in bowel habits Skin: ( - ) abnormal skin rashes Lymphatics: ( - ) new lymphadenopathy, ( - ) easy bruising Neurological: ( - ) numbness, ( - ) tingling, ( - ) new weaknesses Behavioral/Psych: ( - ) mood change, ( - ) new changes  All other systems were reviewed with the patient and are negative.  PHYSICAL EXAMINATION: ECOG PERFORMANCE STATUS: 1 - Symptomatic but completely ambulatory  Vitals:   04/28/23 1020  BP: 134/83  Pulse: 64   Resp: 13  Temp: 98 F (36.7 C)  SpO2: 95%   Filed  Weights   04/28/23 1020  Weight: 186 lb 9.6 oz (84.6 kg)    GENERAL: Well-appearing elderly Caucasian male, alert, no distress and comfortable SKIN: skin color, texture, turgor are normal, no rashes or significant lesions EYES: conjunctiva are pink and non-injected, sclera clear LUNGS: clear to auscultation and percussion with normal breathing effort HEART: regular rate & rhythm and no murmurs and no lower extremity edema Musculoskeletal: no cyanosis of digits and no clubbing  PSYCH: alert & oriented x 3, fluent speech NEURO: no focal motor/sensory deficits  LABORATORY DATA:  I have reviewed the data as listed    Latest Ref Rng & Units 04/28/2023    9:35 AM 04/07/2023    9:58 AM 03/16/2023    8:07 AM  CBC  WBC 4.0 - 10.5 K/uL 7.0  6.5  6.3   Hemoglobin 13.0 - 17.0 g/dL 65.7  84.6  96.2   Hematocrit 39.0 - 52.0 % 44.7  42.6  42.4   Platelets 150 - 400 K/uL 219  231  204        Latest Ref Rng & Units 04/07/2023    9:58 AM 03/16/2023    8:07 AM 02/23/2023    8:15 AM  CMP  Glucose 70 - 99 mg/dL 952  841  97   BUN 8 - 23 mg/dL 17  18  21    Creatinine 0.61 - 1.24 mg/dL 3.24  4.01  0.27   Sodium 135 - 145 mmol/L 140  141  138   Potassium 3.5 - 5.1 mmol/L 4.1  4.1  4.3   Chloride 98 - 111 mmol/L 107  109  107   CO2 22 - 32 mmol/L 28  26  26    Calcium 8.9 - 10.3 mg/dL 9.7  9.7  25.3   Total Protein 6.5 - 8.1 g/dL 6.7  6.5  6.8   Total Bilirubin 0.0 - 1.2 mg/dL 0.6  0.6  0.6   Alkaline Phos 38 - 126 U/L 73  75  82   AST 15 - 41 U/L 20  20  22    ALT 0 - 44 U/L 22  22  25      Lab Results  Component Value Date   MPROTEIN Not Observed 09/27/2020   Lab Results  Component Value Date   KPAFRELGTCHN 25.6 (H) 09/27/2020   LAMBDASER 23.0 09/27/2020   KAPLAMBRATIO 1.11 09/27/2020    RADIOGRAPHIC STUDIES: No results found.   ASSESSMENT & PLAN Martin Curry is a 89 y.o. male with medical history significant for static  squamous cell carcinoma of unclear origin as well as basal cell carcinoma behind the right ear who presents for follow-up visit.  At this time the patient's clinical findings are complicated.  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  It is unclear if these 2 processes are the same or 2 separate primaries.  Additionally there is no clear evidence of a primary squamous cell carcinoma (however my concern is that the area behind the right ear may represent the same process).  Patient also does have PET avid areas of his bowels which I do not suspect are related, but we will have evaluated further with EGD/colonoscopy as needed.  At this time the best treatment course would be to treat with Cemiplimab which should be effective at treating both basal cell carcinoma and squamous cell carcinoma, effectively treating both even if they are separate processes.  Overall he voices understanding of this complicated situation and our plan moving forward.  Of note radiation oncology and ENT are on board and willing to assist with management of his current findings.  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 Previously discussed with radiology who notes a repeat biopsy was not recommended and repeat imaging would be preferred.  --negative multiple myeloma labs with SPEP and serum free light chains showing no evidence of monoclonal gammopathy.  --Given his prior history of prostate cancer we ordered a PSA, which was normal. Repeated PSA previously, found to be within normal limits --CT C/A/P shows no evidence of disease elsewhere in the body in August 2022. Most recent PET CT scan on 10/02/2022 showed hypermetabolic osseous metastasis with a dominant mass centered about the inferior medial portion of the right mastoid, suspicious for  site of primary squamous cell carcinoma. -- PET CT scan shows evidence of lesion behind the right ear as well as the known pelvic metastasis. -- Reviewed tissue with pathology to determine if both lesions are the same pathology or different origins.  At this time findings are most consistent with a basal cell primary behind his ear and squamous cell carcinoma of the pelvis.  It is possible these at the same pathology with the basal/squamous crossover, though they may represent separate primaries.  Either way Cemiplimab therapy should be adequate to treat both. -- PD-L1 testing showed 0% PD-L1 score, however the use of cemiplimab does not require PD-L1 positivity (lack of PD-L1 prevents the use of pembrolizumab)  PLAN: --Proceed with cycle 9 day 1 of Cemiplimab immunotherapy today --Labs today show white blood cell count 7.0, Hgb 15.3, MCV 91.6, Plt 219 --PET CT scan on 01/25/2023 showed response to therapy as evidenced by decrease in FDG avidity within a destructive soft tissue mass involving the right mastoid as well as within osseous metastases.  Also previously noted hypermetabolic cecal mass, highly worrisome for colon carcinoma .  Discussed below.  Next PET CT scan scheduled for 05/03/2023. -- Return to clinic for next Cycle of cemiplimab in 3 weeks time.   # Bleeding of the Ear--healed -- secondary due to erosion of the tumor into his ear canal. -- wound has healed without evidence of bleeding.  -- monitor for now.   # FDG Avid Colon Lesion --Patient underwent colonoscopy with gastroenterology and found to have large polyps. -- No evidence of colonic malignancy.  No orders of the defined types were placed in this encounter.   All questions were answered. The patient knows to call the clinic with any problems, questions or concerns.  A total of more than 30 minutes were spent on this encounter with face-to-face time and non-face-to-face time, including preparing to see the patient,  ordering tests and/or medications, counseling the patient and coordination of care as outlined above.   Georga Kaufmann PA-C Dept of Hematology and Oncology [facility] Phone: [phone]  04/28/2023 10:46 AM

## 2023-04-28 NOTE — Patient Instructions (Signed)
 [facility] - A DEPT OF [facility]  Discharge Instructions: Thank you for choosing [facility] to provide your oncology and hematology care.   If you have a lab appointment with the Cancer Center, please go directly to the Cancer Center and check in at the registration area.   Wear comfortable clothing and clothing appropriate for easy access to any Portacath or PICC line.   We strive to give you quality time with your provider. You may need to reschedule your appointment if you arrive late (15 or more minutes).  Arriving late affects you and other patients whose appointments are after yours.  Also, if you miss three or more appointments without notifying the office, you may be dismissed from the clinic at the provider's discretion.      For prescription refill requests, have your pharmacy contact our office and allow 72 hours for refills to be completed.    Today you received the following chemotherapy and/or immunotherapy agents: Libtayo.       To help prevent nausea and vomiting after your treatment, we encourage you to take your nausea medication as directed.  BELOW ARE SYMPTOMS THAT SHOULD BE REPORTED IMMEDIATELY: *FEVER GREATER THAN 100.4 F (38 C) OR HIGHER *CHILLS OR SWEATING *NAUSEA AND VOMITING THAT IS NOT CONTROLLED WITH YOUR NAUSEA MEDICATION *UNUSUAL SHORTNESS OF BREATH *UNUSUAL BRUISING OR BLEEDING *URINARY PROBLEMS (pain or burning when urinating, or frequent urination) *BOWEL PROBLEMS (unusual diarrhea, constipation, pain near the anus) TENDERNESS IN MOUTH AND THROAT WITH OR WITHOUT PRESENCE OF ULCERS (sore throat, sores in mouth, or a toothache) UNUSUAL RASH, SWELLING OR PAIN  UNUSUAL VAGINAL DISCHARGE OR ITCHING   Items with * indicate a potential emergency and should be followed up as soon as possible or go to the Emergency Department if any problems should occur.  Please show the CHEMOTHERAPY ALERT CARD or IMMUNOTHERAPY  ALERT CARD at check-in to the Emergency Department and triage nurse.  Should you have questions after your visit or need to cancel or reschedule your appointment, please contact [facility] - A DEPT OF [facility]  Dept: [phone]  and follow the prompts.  Office hours are 8:00 a.m. to 4:30 p.m. Monday - Friday. Please note that voicemails left after 4:00 p.m. may not be returned until the following business day.  We are closed weekends and major holidays. You have access to a nurse at all times for urgent questions. Please call the main number to the clinic Dept: [phone] and follow the prompts.   For any non-urgent questions, you may also contact your provider using MyChart. We now offer e-Visits for anyone 71 and older to request care online for non-urgent symptoms. For details visit [email or web address].   Also download the MyChart app! Go to the app store, search "MyChart", open the app, select [facility], and log in with your MyChart username and password.

## 2023-04-29 LAB — T4: T4, Total: 9 ug/dL (ref 4.5–12.0)

## 2023-05-03 ENCOUNTER — Encounter (HOSPITAL_COMMUNITY)
Admission: RE | Admit: 2023-05-03 | Discharge: 2023-05-03 | Disposition: A | Discharge status: Home / Self Care | Payer: Medicare Other | Source: Ambulatory Visit | Attending: Hematology and Oncology | Admitting: Hematology and Oncology

## 2023-05-03 ENCOUNTER — Other Ambulatory Visit: Payer: Self-pay | Admitting: Hematology and Oncology

## 2023-05-03 DIAGNOSIS — C4441 Basal cell carcinoma of skin of scalp and neck: Secondary | ICD-10-CM | POA: Diagnosis not present

## 2023-05-03 DIAGNOSIS — C7951 Secondary malignant neoplasm of bone: Secondary | ICD-10-CM | POA: Insufficient documentation

## 2023-05-03 DIAGNOSIS — C4492 Squamous cell carcinoma of skin, unspecified: Secondary | ICD-10-CM

## 2023-05-03 DIAGNOSIS — Z95828 Presence of other vascular implants and grafts: Secondary | ICD-10-CM

## 2023-05-03 LAB — GLUCOSE, CAPILLARY: Glucose-Capillary: 125 mg/dL — ABNORMAL HIGH (ref 70–99)

## 2023-05-03 MED ORDER — FLUDEOXYGLUCOSE F - 18 (FDG) INJECTION
9.3000 | Freq: Once | INTRAVENOUS | Status: AC
  Administered 2023-05-03: 9.22 via INTRAVENOUS

## 2023-05-05 ENCOUNTER — Other Ambulatory Visit: Payer: Self-pay

## 2023-05-10 ENCOUNTER — Other Ambulatory Visit: Payer: Self-pay

## 2023-05-14 DIAGNOSIS — L570 Actinic keratosis: Secondary | ICD-10-CM | POA: Diagnosis not present

## 2023-05-14 DIAGNOSIS — L84 Corns and callosities: Secondary | ICD-10-CM | POA: Diagnosis not present

## 2023-05-14 DIAGNOSIS — Z85828 Personal history of other malignant neoplasm of skin: Secondary | ICD-10-CM | POA: Diagnosis not present

## 2023-05-14 DIAGNOSIS — D1801 Hemangioma of skin and subcutaneous tissue: Secondary | ICD-10-CM | POA: Diagnosis not present

## 2023-05-14 DIAGNOSIS — L821 Other seborrheic keratosis: Secondary | ICD-10-CM | POA: Diagnosis not present

## 2023-05-18 ENCOUNTER — Inpatient Hospital Stay: Admitting: Hematology and Oncology

## 2023-05-18 ENCOUNTER — Other Ambulatory Visit: Payer: Medicare Other

## 2023-05-18 ENCOUNTER — Inpatient Hospital Stay

## 2023-05-18 ENCOUNTER — Inpatient Hospital Stay: Payer: Medicare Other

## 2023-05-18 ENCOUNTER — Ambulatory Visit: Payer: Medicare Other | Admitting: Physician Assistant

## 2023-05-18 VITALS — BP 123/73 | HR 77 | Temp 97.7°F | Resp 17 | Ht 69.0 in | Wt 184.0 lb

## 2023-05-18 DIAGNOSIS — Z95828 Presence of other vascular implants and grafts: Secondary | ICD-10-CM

## 2023-05-18 DIAGNOSIS — Z5112 Encounter for antineoplastic immunotherapy: Secondary | ICD-10-CM

## 2023-05-18 DIAGNOSIS — Z79620 Long term (current) use of immunosuppressive biologic: Secondary | ICD-10-CM | POA: Diagnosis not present

## 2023-05-18 DIAGNOSIS — C7951 Secondary malignant neoplasm of bone: Secondary | ICD-10-CM

## 2023-05-18 DIAGNOSIS — C4441 Basal cell carcinoma of skin of scalp and neck: Secondary | ICD-10-CM

## 2023-05-18 DIAGNOSIS — C4492 Squamous cell carcinoma of skin, unspecified: Secondary | ICD-10-CM

## 2023-05-18 LAB — CBC WITH DIFFERENTIAL (CANCER CENTER ONLY)
Abs Immature Granulocytes: 0.03 10*3/uL (ref 0.00–0.07)
Basophils Absolute: 0 10*3/uL (ref 0.0–0.1)
Basophils Relative: 0 %
Eosinophils Absolute: 0.1 10*3/uL (ref 0.0–0.5)
Eosinophils Relative: 1 %
HCT: 45.9 % (ref 39.0–52.0)
Hemoglobin: 15.7 g/dL (ref 13.0–17.0)
Immature Granulocytes: 0 %
Lymphocytes Relative: 12 %
Lymphs Abs: 1.3 10*3/uL (ref 0.7–4.0)
MCH: 31.3 pg (ref 26.0–34.0)
MCHC: 34.2 g/dL (ref 30.0–36.0)
MCV: 91.4 fL (ref 80.0–100.0)
Monocytes Absolute: 0.7 10*3/uL (ref 0.1–1.0)
Monocytes Relative: 6 %
Neutro Abs: 8.8 10*3/uL — ABNORMAL HIGH (ref 1.7–7.7)
Neutrophils Relative %: 81 %
Platelet Count: 261 10*3/uL (ref 150–400)
RBC: 5.02 MIL/uL (ref 4.22–5.81)
RDW: 13.2 % (ref 11.5–15.5)
WBC Count: 10.9 10*3/uL — ABNORMAL HIGH (ref 4.0–10.5)
nRBC: 0 % (ref 0.0–0.2)

## 2023-05-18 LAB — CMP (CANCER CENTER ONLY)
ALT: 21 U/L (ref 0–44)
AST: 20 U/L (ref 15–41)
Albumin: 4.2 g/dL (ref 3.5–5.0)
Alkaline Phosphatase: 78 U/L (ref 38–126)
Anion gap: 5 (ref 5–15)
BUN: 23 mg/dL (ref 8–23)
CO2: 26 mmol/L (ref 22–32)
Calcium: 9.9 mg/dL (ref 8.9–10.3)
Chloride: 108 mmol/L (ref 98–111)
Creatinine: 1.35 mg/dL — ABNORMAL HIGH (ref 0.61–1.24)
GFR, Estimated: 50 mL/min — ABNORMAL LOW (ref >60)
Glucose, Bld: 108 mg/dL — ABNORMAL HIGH (ref 70–99)
Potassium: 4.4 mmol/L (ref 3.5–5.1)
Sodium: 139 mmol/L (ref 135–145)
Total Bilirubin: 0.7 mg/dL (ref 0.0–1.2)
Total Protein: 7.1 g/dL (ref 6.5–8.1)

## 2023-05-18 LAB — TSH: TSH: 2.011 u[IU]/mL (ref 0.350–4.500)

## 2023-05-18 MED ORDER — HEPARIN SOD (PORK) LOCK FLUSH 100 UNIT/ML IV SOLN
500.0000 [IU] | Freq: Once | INTRAVENOUS | Status: AC | PRN
  Administered 2023-05-18: 500 [IU]

## 2023-05-18 MED ORDER — SODIUM CHLORIDE 0.9% FLUSH
10.0000 mL | INTRAVENOUS | Status: DC | PRN
  Administered 2023-05-18: 10 mL

## 2023-05-18 MED ORDER — SODIUM CHLORIDE 0.9 % IV SOLN
350.0000 mg | Freq: Once | INTRAVENOUS | Status: AC
  Administered 2023-05-18: 350 mg via INTRAVENOUS
  Filled 2023-05-18: qty 7

## 2023-05-18 MED ORDER — SODIUM CHLORIDE 0.9 % IV SOLN: Freq: Once | INTRAVENOUS | Status: AC

## 2023-05-18 NOTE — Progress Notes (Signed)
 [facility] [phone]   [phone]  PROGRESS NOTE  Patient Care Team: Garlan Fillers, MD as PCP - General (Internal Medicine) Garlan Fillers, MD as Consulting Physician (Internal Medicine) Exie Parody, MD (Hematology and Oncology) Bjorn Pippin, MD as Attending Physician (Urology) Mardella Layman, MD as Attending Physician (Gastroenterology) Janalyn Harder, MD (Inactive) as Consulting Physician (Dermatology) Lonie Peak, MD as Consulting Physician (Radiation Oncology) Malmfelt, Lise Auer, RN as Oncology Nurse Navigator (Oncology)  Hematological/Oncological History # Bone Lesions on CT/MRI # 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 Additionally there was noted to be a new enhancing lesion at the tip of the right greater trochanter measuring up to 1.7 cm also suspicious for metastatic disease. 09/27/2020: establish care with Dr. Leonides Schanz 10/29/2020: CT biopsy performed of left iliac bone lesion. Pathology showed bone and marrow without evidence of carcinoma.  11/11/2022: Cycle 1 Day 1 of cemplimab therapy.  12/03/2022: Cycle 2 Day 1 of cemplimab therapy.  12/23/2022: Cycle 3 Day 1 of cemplimab therapy.  01/13/2023: Cycle 4 Day 1 of cemplimab therapy.  02/03/2023: Cycle 5 Day 1 of cemplimab therapy.  02/23/2023: Cycle 6 Day 1 of cemplimab therapy.  03/16/2023:  Cycle 7 Day 1 of cemplimab therapy.  04/06/2023: Cycle 8 Day 1 of cemplimab therapy.  04/28/2023: Cycle 9 Day 1 of cemplimab therapy.  05/18/2023: Cycle 10 Day 1 of cemplimab therapy.   Interval History:  Martin Curry 89 y.o. 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 The patient's last visit was on 04/28/2023. In the interim since the last visit he has completed Cycle 9 of cemiplimab.  On exam today Martin Curry reports he has been well overall in the interim since her last visit.  Over the last 4 weeks he has not required a bandage over the wound on the right side of his head, with no drainage.  He also reports there is only mild to minimal ear drainage.  He reports that the chemotherapy does make him occasionally "lazy".  But he notes he is not working and it does not interfere with his day-to-day life.  He did have 1 night of nausea recently but no vomiting.  His bowels are moving regularly and other than low energy he has no complaints. Overall he feels well and is willing and able to proceed with immunotherapy at this time he denies any fevers, chills, sweats, shortness of breath, chest pain or cough.   A full 10 point ROS is listed below.   MEDICAL HISTORY:  Past Medical History:  Diagnosis Date   Atypical nevus 09/07/2002   left abdomen-slight   BCC (basal cell carcinoma of skin) 07/19/2014   left sideburn- +margin-exc.   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 if orignated from skin cancer   Nodular basal cell carcinoma (BCC) 11/21/2012   right side of scalp-CX35FU   Nodular basal cell carcinoma (BCC) 06/12/2014   left sideburn-CX35FU/exc.   Nodular basal  cell carcinoma (BCC) 07/16/2015   left scalp-CX35FU   Polycythemia    Prostate cancer (HCC) 1990's   total prostatectomy and adjuvant radiation. Last PSA was 0.02 in 09/2011   SCC (squamous cell carcinoma) 04/03/2009   forehead-txpbx   SCC (squamous cell carcinoma) 07/16/2015   left front scalp   SCC (squamous cell carcinoma) 07/16/2015   left front scalp-Cx35FU   SCCA (squamous cell carcinoma) of skin 12/20/2019   Right forehead (in situ)   SCCA (squamous cell carcinoma) of skin 05/27/2020   Mid Parietal Scalp (in situ)   Superficial basal cell carcinoma (BCC) 11/21/2012   behind left ear-CX35FU, midback-CX35FU, right forehead-CX35FU-exc   Superficial basal cell carcinoma (BCC) 11/02/2017   left post neck-CX35FU   Superficial nodular basal cell carcinoma (BCC) 12/20/2019   Left temporal scalp   Superficial nodular basal cell carcinoma (BCC) 05/27/2020   Right Preauricular area (curet and 5FU)    SURGICAL HISTORY: Past Surgical History:  Procedure Laterality Date   ADJACENT TISSUE TRANSFER/TISSUE REARRANGEMENT N/A 05/22/2019   Procedure: COMPLEX CLOSURE OF NECK WOUND;  Surgeon: Allena Napoleon, MD;  Location: [facility] OR;  Service: Plastics;  Laterality: N/A;   ALLOGRAFT APPLICATION N/A 05/22/2019   Procedure: POSSIBLE FACIAL NERVE RECONSTRUCTION WITH NERVE ALLOGRAFT VS AUTOGRAFT;  Surgeon: Allena Napoleon, MD;  Location: [facility] OR;  Service: Plastics;  Laterality: N/A;   BLADDER SURGERY     due to prostatectomy.    CATARACT EXTRACTION     right   COLONOSCOPY     neg in the past; due in 2014 with [place] Dr. Jarold Motto   EYE SURGERY     1950   HERNIA REPAIR     IR IMAGING GUIDED PORT INSERTION  11/30/2022   MOHS SURGERY     NECK DISSECTION  05/22/2019    NECK DISSECTION   PAROTIDECTOMY  05/22/2019   PAROTIDECTOMY with facial nerve dissection    PAROTIDECTOMY Right 05/22/2019   Procedure: PAROTIDECTOMY;  Surgeon: Serena Colonel, MD;  Location: [facility] OR;  Service: ENT;  Laterality:  Right;   PROSTATE SURGERY     RADICAL NECK DISSECTION Right 05/22/2019   Procedure: RADICAL NECK DISSECTION;  Surgeon: Serena Colonel, MD;  Location: [facility] OR;  Service: ENT;  Laterality: Right;   TONSILLECTOMY      SOCIAL HISTORY: Social History   Socioeconomic History   Marital status: Married    Spouse name: Not on file   Number of children: 3   Years of education: Not on file   Highest education level: Not on file  Occupational History    Comment: retired Scientist, [employer]; 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   Occupation: retired  Tobacco Use   Smoking status: Never   Smokeless tobacco: Never  Vaping Use   Vaping status: Never Used  Substance and Sexual Activity   Alcohol use: No   Drug use: No   Sexual activity: Not on file  Other Topics Concern   Not on file  Social History Narrative   Not on file   Social Drivers of Health   Financial  Resource Strain: Not on file  Food Insecurity: No Food Insecurity (10/29/2022)   Hunger Vital Sign    Worried About Running Out of Food in the Last Year: Never true    Ran Out of Food in the Last Year: Never true  Transportation Needs: No Transportation Needs (10/29/2022)   PRAPARE - Administrator, Civil Service (Medical): No    Lack of Transportation (Non-Medical): No  Physical Activity: Not on file  Stress: Not on file  Social Connections: Not on file  Intimate Partner Violence: Not At Risk (10/29/2022)   Humiliation, Afraid, Rape, and Kick questionnaire    Fear of Current or Ex-Partner: No    Emotionally Abused: No    Physically Abused: No    Sexually Abused: No    FAMILY HISTORY: Family History  Problem Relation Age of Onset   Uterine cancer Mother 41   Ovarian cancer Mother    Lung cancer Father 3   Prostate cancer Brother    Prostate cancer Brother    Colon cancer Neg Hx    Stomach cancer Neg Hx    Esophageal cancer Neg Hx    Rectal cancer Neg Hx     ALLERGIES:  is allergic to methylprednisolone, penicillins, latex, and  tape.  MEDICATIONS:  Current Outpatient Medications  Medication Sig Dispense Refill   amLODipine (NORVASC) 5 MG tablet Take 5 mg by mouth daily.      aspirin 81 MG tablet Take 81 mg by mouth daily.     benazepril (LOTENSIN) 10 MG tablet Take 10 mg by mouth daily.     famotidine-calcium carbonate-magnesium hydroxide (PEPCID COMPLETE) 10-800-165 MG chewable tablet Take one tablet PRN     levothyroxine (SYNTHROID) 25 MCG tablet Take 25 mcg by mouth daily before breakfast. 2 tABS M,W,F and one the other days     lidocaine-prilocaine (EMLA) cream Apply 1 Application topically as needed. 30 g 0   Multiple Vitamin (MULTIVITAMIN) tablet Take 1 tablet by mouth daily.     potassium citrate (UROCIT-K) 10 MEQ (1080 MG) SR tablet Take 10 mEq by mouth 3 (three) times daily with meals.     simvastatin (ZOCOR) 20 MG tablet Take 20 mg by mouth every evening.     ondansetron (ZOFRAN) 8 MG tablet Take 1 tablet (8 mg total) by mouth every 8 (eight) hours as needed for nausea or vomiting. (Patient not taking: Reported on 02/08/2023) 20 tablet 1   No current facility-administered medications for this visit.   Facility-Administered Medications Ordered in Other Visits  Medication Dose Route Frequency Provider Last Rate Last Admin   sodium chloride flush (NS) 0.9 % injection 10 mL  10 mL Intracatheter PRN Jaci Standard, MD   10 mL at 05/18/23 1257    REVIEW OF SYSTEMS:   Constitutional: ( - ) fevers, ( - )  chills , ( - ) night sweats Eyes: ( - ) blurriness of vision, ( - ) double vision, ( - ) watery eyes Ears, nose, mouth, throat, and face: ( - ) mucositis, ( - ) sore throat Respiratory: ( - ) cough, ( - ) dyspnea, ( - ) wheezes Cardiovascular: ( - ) palpitation, ( - ) chest discomfort, ( - ) lower extremity swelling Gastrointestinal:  ( - ) nausea, ( - ) heartburn, ( - ) change in bowel habits Skin: ( - ) abnormal skin rashes Lymphatics: ( - ) new lymphadenopathy, ( - ) easy bruising Neurological: ( -  ) numbness, ( - )  tingling, ( - ) new weaknesses Behavioral/Psych: ( - ) mood change, ( - ) new changes  All other systems were reviewed with the patient and are negative.  PHYSICAL EXAMINATION: ECOG PERFORMANCE STATUS: 1 - Symptomatic but completely ambulatory  Vitals:   05/18/23 1106  BP: 123/73  Pulse: 77  Resp: 17  Temp: 97.7 F (36.5 C)  SpO2: 98%    Filed Weights   05/18/23 1106  Weight: 184 lb (83.5 kg)     GENERAL: Well-appearing elderly Caucasian male, alert, no distress and comfortable SKIN: skin color, texture, turgor are normal, no rashes or significant lesions EYES: conjunctiva are pink and non-injected, sclera clear LUNGS: clear to auscultation and percussion with normal breathing effort HEART: regular rate & rhythm and no murmurs and no lower extremity edema Musculoskeletal: no cyanosis of digits and no clubbing  PSYCH: alert & oriented x 3, fluent speech NEURO: no focal motor/sensory deficits  LABORATORY DATA:  I have reviewed the data as listed    Latest Ref Rng & Units 05/18/2023   10:23 AM 04/28/2023    9:35 AM 04/07/2023    9:58 AM  CBC  WBC 4.0 - 10.5 K/uL 10.9  7.0  6.5   Hemoglobin 13.0 - 17.0 g/dL 09.8  11.9  14.7   Hematocrit 39.0 - 52.0 % 45.9  44.7  42.6   Platelets 150 - 400 K/uL 261  219  231        Latest Ref Rng & Units 05/18/2023   10:23 AM 04/28/2023    9:35 AM 04/07/2023    9:58 AM  CMP  Glucose 70 - 99 mg/dL 829  562  130   BUN 8 - 23 mg/dL 23  19  17    Creatinine 0.61 - 1.24 mg/dL 8.65  7.84  6.96   Sodium 135 - 145 mmol/L 139  140  140   Potassium 3.5 - 5.1 mmol/L 4.4  4.2  4.1   Chloride 98 - 111 mmol/L 108  109  107   CO2 22 - 32 mmol/L 26  26  28    Calcium 8.9 - 10.3 mg/dL 9.9  9.7  9.7   Total Protein 6.5 - 8.1 g/dL 7.1  6.9  6.7   Total Bilirubin 0.0 - 1.2 mg/dL 0.7  0.6  0.6   Alkaline Phos 38 - 126 U/L 78  76  73   AST 15 - 41 U/L 20  19  20    ALT 0 - 44 U/L 21  20  22      Lab Results  Component Value Date    MPROTEIN Not Observed 09/27/2020   Lab Results  Component Value Date   KPAFRELGTCHN 25.6 (H) 09/27/2020   LAMBDASER 23.0 09/27/2020   KAPLAMBRATIO 1.11 09/27/2020    RADIOGRAPHIC STUDIES: NM PET Image Restage (PS) Whole Body (F-18 FDG) Result Date: 05/17/2023 CLINICAL DATA:  Subsequent treatment strategy for basal cell carcinoma. EXAM: NUCLEAR MEDICINE PET WHOLE BODY TECHNIQUE: 9.22 mCi F-18 FDG was injected intravenously. Full-ring PET imaging was performed from the head to foot after the radiotracer. CT data was obtained and used for attenuation correction and anatomic localization. Fasting blood glucose: 125 mg/dl COMPARISON:  None Available. FINDINGS: Mediastinal blood pool activity: SUV max 2.74 HEAD/NECK: Overall improved appearance of the large upper neck and right mastoid destructive process. Less soft tissue density with likely contracting scar tissue and resolving tumor. SUV max is 4.03 and was previously 13.69. No cervical lymphadenopathy. Stable 2.6 cm right thyroid lobe  lesion with SUV max of 5.14. Incidental CT findings: none CHEST: Stable multiple calcified mediastinal hilar nodes likely chronic inflammatory change. No worrisome pulmonary lesions. No supraclavicular or axillary adenopathy. Mild FDG uptake in the hiatal hernia, likely due to reflux. Incidental CT findings: Stable fusiform aneurysmal dilatation of the ascending thoracic aorta and stable aortic and coronary artery calcifications. No acute pulmonary disease. ABDOMEN/PELVIS: No abnormal hypermetabolic activity within the liver, pancreas, adrenal glands, or spleen. No hypermetabolic lymph nodes in the abdomen or pelvis. Incidental CT findings: Stable left inguinal hernia containing part of the sigmoid colon. Colonic diverticulosis. Advanced vascular disease. Stable left renal cyst. Status post prostatectomy. SKELETON: Stable sclerotic metastatic bone disease these lesions are no longer hypermetabolic suggesting an excellent  response to treatment. Incidental CT findings: none EXTREMITIES: No abnormal hypermetabolic activity in the lower extremities. Incidental CT findings: none IMPRESSION: 1. Overall improved appearance of the large upper right neck and right mastoid destructive process. Less soft tissue density with likely contracting scar tissue and resolving tumor. Significant interval decrease in FDG uptake. 2. Stable sclerotic metastatic bone disease but these lesions are no longer hypermetabolic suggesting an excellent response to treatment. 3. Stable hypermetabolic mediastinal and hilar nodes, likely chronic granulomatis disease. 4. No findings for new or progressive disease. 5. Stable 2.6 cm right thyroid lobe lesion with SUV max of 5.14. 6. Stable fusiform aneurysmal dilatation of the ascending thoracic aorta and stable aortic and coronary artery calcifications. 7. Stable left inguinal hernia containing part of the sigmoid colon. 8. Status post prostatectomy. 9. Aortic atherosclerosis. Electronically Signed   By: Rudie Meyer [name]   On: 05/17/2023 11:42     ASSESSMENT & PLAN Martin Curry is a 89 y.o. 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  At this time the patient's clinical findings are complicated.  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  It is unclear if these 2 processes are the same or 2 separate primaries.  Additionally there is no clear evidence of a primary squamous cell carcinoma (however my concern is that the area behind the right ear may represent the same process).  Patient also does have PET avid areas of his bowels which I do not suspect are related, but we will have evaluated further with EGD/colonoscopy as needed.  At this time the best treatment course would be to treat with  Cemiplimab which should be effective at treating both basal cell carcinoma and squamous cell carcinoma, effectively treating both even if they are separate processes.  Overall he voices understanding of this complicated situation and our plan moving forward.  Of note radiation oncology and ENT are on board and willing to assist with management of his current findings.  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 Previously discussed with radiology who notes a repeat biopsy was not recommended and repeat imaging would be preferred.  --negative multiple myeloma labs with SPEP and serum free light chains showing no evidence of monoclonal gammopathy.  --Given his prior history of prostate cancer we ordered a PSA, which was normal. Repeated PSA previously, found to be within normal limits --CT C/A/P shows no evidence of disease elsewhere in the body in August 2022.  Most recent PET CT scan on 10/02/2022 showed hypermetabolic osseous metastasis with a dominant mass centered about the inferior medial portion of the right mastoid, suspicious for site of primary squamous cell carcinoma. -- PET CT scan shows evidence of lesion behind the right ear as well as the known pelvic metastasis. -- Reviewed tissue with pathology to determine if both lesions are the same pathology or different origins.  At this time findings are most consistent with a basal cell primary behind his ear and squamous cell carcinoma of the pelvis.  It is possible these at the same pathology with the basal/squamous crossover, though they may represent separate primaries.  Either way Cemiplimab therapy should be adequate to treat both. -- PD-L1 testing showed 0% PD-L1 score, however the use of cemiplimab does not require PD-L1 positivity (lack of PD-L1 prevents the use of pembrolizumab)  PLAN: --Proceed with cycle 10 day 1 of Cemiplimab  immunotherapy today --Labs today show white blood cell count 10.9, hemoglobin 15.7, MCV 91.4, platelets 261 --PET CT scan on 01/25/2023 showed response to therapy as evidenced by decrease in FDG avidity within a destructive soft tissue mass involving the right mastoid as well as within osseous metastases.  Also previously noted hypermetabolic cecal mass, highly worrisome for colon carcinoma (colonoscopy confirmed polyps with no active malignancy . PET CT scan on 05/03/2023 showed no new or progressive disease with overall response in most areas.  -- Return to clinic for next Cycle of cemiplimab in 3 weeks time.   # Bleeding of the Ear--healed -- secondary due to erosion of the tumor into his ear canal. -- wound has healed without evidence of bleeding.  -- monitor for now.   # FDG Avid Colon Lesion --Patient underwent colonoscopy with gastroenterology and found to have large polyps. -- No evidence of colonic malignancy.  No orders of the defined types were placed in this encounter.   All questions were answered. The patient knows to call the clinic with any problems, questions or concerns.  A total of more than 30 minutes were spent on this encounter with face-to-face time and non-face-to-face time, including preparing to see the patient, ordering tests and/or medications, counseling the patient and coordination of care as outlined above.   Ulysees Barns, MD Department of Hematology/Oncology [facility] Phone: [phone] Pager: [phone] Email: [email or web address]   05/18/2023 1:52 PM

## 2023-05-18 NOTE — Patient Instructions (Signed)
 [facility] - A DEPT OF [facility]  Discharge Instructions: Thank you for choosing [facility] to provide your oncology and hematology care.   If you have a lab appointment with the Cancer Center, please go directly to the Cancer Center and check in at the registration area.   Wear comfortable clothing and clothing appropriate for easy access to any Portacath or PICC line.   We strive to give you quality time with your provider. You may need to reschedule your appointment if you arrive late (15 or more minutes).  Arriving late affects you and other patients whose appointments are after yours.  Also, if you miss three or more appointments without notifying the office, you may be dismissed from the clinic at the provider's discretion.      For prescription refill requests, have your pharmacy contact our office and allow 72 hours for refills to be completed.    Today you received the following chemotherapy and/or immunotherapy agents: Libtayo.       To help prevent nausea and vomiting after your treatment, we encourage you to take your nausea medication as directed.  BELOW ARE SYMPTOMS THAT SHOULD BE REPORTED IMMEDIATELY: *FEVER GREATER THAN 100.4 F (38 C) OR HIGHER *CHILLS OR SWEATING *NAUSEA AND VOMITING THAT IS NOT CONTROLLED WITH YOUR NAUSEA MEDICATION *UNUSUAL SHORTNESS OF BREATH *UNUSUAL BRUISING OR BLEEDING *URINARY PROBLEMS (pain or burning when urinating, or frequent urination) *BOWEL PROBLEMS (unusual diarrhea, constipation, pain near the anus) TENDERNESS IN MOUTH AND THROAT WITH OR WITHOUT PRESENCE OF ULCERS (sore throat, sores in mouth, or a toothache) UNUSUAL RASH, SWELLING OR PAIN  UNUSUAL VAGINAL DISCHARGE OR ITCHING   Items with * indicate a potential emergency and should be followed up as soon as possible or go to the Emergency Department if any problems should occur.  Please show the CHEMOTHERAPY ALERT CARD or IMMUNOTHERAPY  ALERT CARD at check-in to the Emergency Department and triage nurse.  Should you have questions after your visit or need to cancel or reschedule your appointment, please contact [facility] - A DEPT OF [facility]  Dept: [phone]  and follow the prompts.  Office hours are 8:00 a.m. to 4:30 p.m. Monday - Friday. Please note that voicemails left after 4:00 p.m. may not be returned until the following business day.  We are closed weekends and major holidays. You have access to a nurse at all times for urgent questions. Please call the main number to the clinic Dept: [phone] and follow the prompts.   For any non-urgent questions, you may also contact your provider using MyChart. We now offer e-Visits for anyone 71 and older to request care online for non-urgent symptoms. For details visit [email or web address].   Also download the MyChart app! Go to the app store, search "MyChart", open the app, select [facility], and log in with your MyChart username and password.

## 2023-05-19 ENCOUNTER — Other Ambulatory Visit: Payer: Self-pay

## 2023-05-19 ENCOUNTER — Ambulatory Visit: Payer: Medicare Other | Admitting: Hematology and Oncology

## 2023-05-19 ENCOUNTER — Ambulatory Visit: Payer: Medicare Other

## 2023-05-19 ENCOUNTER — Other Ambulatory Visit: Payer: Medicare Other

## 2023-05-19 LAB — T4: T4, Total: 8.8 ug/dL (ref 4.5–12.0)

## 2023-05-20 DIAGNOSIS — H1789 Other corneal scars and opacities: Secondary | ICD-10-CM | POA: Diagnosis not present

## 2023-06-04 ENCOUNTER — Emergency Department (HOSPITAL_COMMUNITY)

## 2023-06-04 ENCOUNTER — Emergency Department (HOSPITAL_COMMUNITY)
Admission: EM | Admit: 2023-06-04 | Discharge: 2023-06-04 | Disposition: A | Discharge status: Home / Self Care | Attending: Emergency Medicine | Admitting: Emergency Medicine

## 2023-06-04 ENCOUNTER — Encounter (HOSPITAL_COMMUNITY): Payer: Self-pay

## 2023-06-04 ENCOUNTER — Other Ambulatory Visit: Payer: Self-pay

## 2023-06-04 ENCOUNTER — Encounter: Payer: Self-pay | Admitting: Hematology and Oncology

## 2023-06-04 ENCOUNTER — Other Ambulatory Visit (HOSPITAL_COMMUNITY): Payer: Self-pay

## 2023-06-04 DIAGNOSIS — C7951 Secondary malignant neoplasm of bone: Secondary | ICD-10-CM | POA: Diagnosis not present

## 2023-06-04 DIAGNOSIS — W1839XA Other fall on same level, initial encounter: Secondary | ICD-10-CM | POA: Diagnosis not present

## 2023-06-04 DIAGNOSIS — S32010A Wedge compression fracture of first lumbar vertebra, initial encounter for closed fracture: Secondary | ICD-10-CM | POA: Diagnosis not present

## 2023-06-04 DIAGNOSIS — S2231XA Fracture of one rib, right side, initial encounter for closed fracture: Secondary | ICD-10-CM | POA: Diagnosis not present

## 2023-06-04 DIAGNOSIS — M549 Dorsalgia, unspecified: Secondary | ICD-10-CM | POA: Diagnosis not present

## 2023-06-04 DIAGNOSIS — Z8546 Personal history of malignant neoplasm of prostate: Secondary | ICD-10-CM | POA: Diagnosis not present

## 2023-06-04 DIAGNOSIS — Y9301 Activity, walking, marching and hiking: Secondary | ICD-10-CM | POA: Diagnosis not present

## 2023-06-04 DIAGNOSIS — R197 Diarrhea, unspecified: Secondary | ICD-10-CM | POA: Diagnosis not present

## 2023-06-04 DIAGNOSIS — Z85828 Personal history of other malignant neoplasm of skin: Secondary | ICD-10-CM | POA: Insufficient documentation

## 2023-06-04 DIAGNOSIS — M8580 Other specified disorders of bone density and structure, unspecified site: Secondary | ICD-10-CM | POA: Diagnosis not present

## 2023-06-04 DIAGNOSIS — S301XXA Contusion of abdominal wall, initial encounter: Secondary | ICD-10-CM | POA: Diagnosis not present

## 2023-06-04 DIAGNOSIS — I10 Essential (primary) hypertension: Secondary | ICD-10-CM | POA: Diagnosis not present

## 2023-06-04 DIAGNOSIS — Y92002 Bathroom of unspecified non-institutional (private) residence single-family (private) house as the place of occurrence of the external cause: Secondary | ICD-10-CM | POA: Diagnosis not present

## 2023-06-04 DIAGNOSIS — I517 Cardiomegaly: Secondary | ICD-10-CM | POA: Diagnosis not present

## 2023-06-04 DIAGNOSIS — S0990XA Unspecified injury of head, initial encounter: Secondary | ICD-10-CM | POA: Diagnosis not present

## 2023-06-04 DIAGNOSIS — S32008A Other fracture of unspecified lumbar vertebra, initial encounter for closed fracture: Secondary | ICD-10-CM | POA: Diagnosis not present

## 2023-06-04 DIAGNOSIS — R220 Localized swelling, mass and lump, head: Secondary | ICD-10-CM | POA: Diagnosis not present

## 2023-06-04 DIAGNOSIS — W19XXXA Unspecified fall, initial encounter: Secondary | ICD-10-CM | POA: Diagnosis not present

## 2023-06-04 DIAGNOSIS — I959 Hypotension, unspecified: Secondary | ICD-10-CM | POA: Diagnosis not present

## 2023-06-04 DIAGNOSIS — Z85830 Personal history of malignant neoplasm of bone: Secondary | ICD-10-CM | POA: Diagnosis not present

## 2023-06-04 DIAGNOSIS — R109 Unspecified abdominal pain: Secondary | ICD-10-CM | POA: Diagnosis not present

## 2023-06-04 DIAGNOSIS — R55 Syncope and collapse: Secondary | ICD-10-CM | POA: Diagnosis not present

## 2023-06-04 DIAGNOSIS — M47814 Spondylosis without myelopathy or radiculopathy, thoracic region: Secondary | ICD-10-CM | POA: Diagnosis not present

## 2023-06-04 DIAGNOSIS — I672 Cerebral atherosclerosis: Secondary | ICD-10-CM | POA: Diagnosis not present

## 2023-06-04 DIAGNOSIS — Z79899 Other long term (current) drug therapy: Secondary | ICD-10-CM | POA: Diagnosis not present

## 2023-06-04 DIAGNOSIS — I6782 Cerebral ischemia: Secondary | ICD-10-CM | POA: Diagnosis not present

## 2023-06-04 DIAGNOSIS — Z91040 Latex allergy status: Secondary | ICD-10-CM | POA: Insufficient documentation

## 2023-06-04 DIAGNOSIS — E041 Nontoxic single thyroid nodule: Secondary | ICD-10-CM | POA: Diagnosis not present

## 2023-06-04 DIAGNOSIS — I6523 Occlusion and stenosis of bilateral carotid arteries: Secondary | ICD-10-CM | POA: Diagnosis not present

## 2023-06-04 DIAGNOSIS — I6789 Other cerebrovascular disease: Secondary | ICD-10-CM | POA: Diagnosis not present

## 2023-06-04 DIAGNOSIS — Z7982 Long term (current) use of aspirin: Secondary | ICD-10-CM | POA: Diagnosis not present

## 2023-06-04 DIAGNOSIS — R102 Pelvic and perineal pain: Secondary | ICD-10-CM | POA: Diagnosis not present

## 2023-06-04 DIAGNOSIS — Z043 Encounter for examination and observation following other accident: Secondary | ICD-10-CM | POA: Diagnosis not present

## 2023-06-04 DIAGNOSIS — R0989 Other specified symptoms and signs involving the circulatory and respiratory systems: Secondary | ICD-10-CM | POA: Diagnosis not present

## 2023-06-04 LAB — CBC WITH DIFFERENTIAL/PLATELET
Abs Immature Granulocytes: 0.05 10*3/uL (ref 0.00–0.07)
Basophils Absolute: 0 10*3/uL (ref 0.0–0.1)
Basophils Relative: 0 %
Eosinophils Absolute: 0.1 10*3/uL (ref 0.0–0.5)
Eosinophils Relative: 0 %
HCT: 46.3 % (ref 39.0–52.0)
Hemoglobin: 15.4 g/dL (ref 13.0–17.0)
Immature Granulocytes: 0 %
Lymphocytes Relative: 3 %
Lymphs Abs: 0.4 10*3/uL — ABNORMAL LOW (ref 0.7–4.0)
MCH: 31.7 pg (ref 26.0–34.0)
MCHC: 33.3 g/dL (ref 30.0–36.0)
MCV: 95.3 fL (ref 80.0–100.0)
Monocytes Absolute: 0.7 10*3/uL (ref 0.1–1.0)
Monocytes Relative: 5 %
Neutro Abs: 12.9 10*3/uL — ABNORMAL HIGH (ref 1.7–7.7)
Neutrophils Relative %: 92 %
Platelets: 238 10*3/uL (ref 150–400)
RBC: 4.86 MIL/uL (ref 4.22–5.81)
RDW: 13.3 % (ref 11.5–15.5)
WBC: 14.1 10*3/uL — ABNORMAL HIGH (ref 4.0–10.5)
nRBC: 0 % (ref 0.0–0.2)

## 2023-06-04 LAB — URINALYSIS, ROUTINE W REFLEX MICROSCOPIC
Bilirubin Urine: NEGATIVE
Glucose, UA: NEGATIVE mg/dL
Hgb urine dipstick: NEGATIVE
Ketones, ur: NEGATIVE mg/dL
Leukocytes,Ua: NEGATIVE
Nitrite: NEGATIVE
Protein, ur: NEGATIVE mg/dL
Specific Gravity, Urine: 1.017 (ref 1.005–1.030)
pH: 5 (ref 5.0–8.0)

## 2023-06-04 LAB — BASIC METABOLIC PANEL WITH GFR
Anion gap: 8 (ref 5–15)
BUN: 28 mg/dL — ABNORMAL HIGH (ref 8–23)
CO2: 22 mmol/L (ref 22–32)
Calcium: 9.3 mg/dL (ref 8.9–10.3)
Chloride: 107 mmol/L (ref 98–111)
Creatinine, Ser: 1.38 mg/dL — ABNORMAL HIGH (ref 0.61–1.24)
GFR, Estimated: 49 mL/min — ABNORMAL LOW (ref >60)
Glucose, Bld: 137 mg/dL — ABNORMAL HIGH (ref 70–99)
Potassium: 4.2 mmol/L (ref 3.5–5.1)
Sodium: 137 mmol/L (ref 135–145)

## 2023-06-04 LAB — CK: Total CK: 96 U/L (ref 49–397)

## 2023-06-04 MED ORDER — ACETAMINOPHEN 325 MG PO TABS
650.0000 mg | ORAL_TABLET | Freq: Once | ORAL | Status: AC
  Administered 2023-06-04: 650 mg via ORAL
  Filled 2023-06-04: qty 2

## 2023-06-04 MED ORDER — IOHEXOL 350 MG/ML SOLN
75.0000 mL | Freq: Once | INTRAVENOUS | Status: AC | PRN
Start: 2023-06-04 — End: 2023-06-04
  Administered 2023-06-04: 75 mL via INTRAVENOUS

## 2023-06-04 MED ORDER — ONDANSETRON HCL 4 MG/2ML IJ SOLN
4.0000 mg | Freq: Once | INTRAMUSCULAR | Status: AC
  Administered 2023-06-04: 4 mg via INTRAVENOUS
  Filled 2023-06-04: qty 2

## 2023-06-04 MED ORDER — LIDOCAINE 5 % EX PTCH
2.0000 | MEDICATED_PATCH | CUTANEOUS | Status: DC
  Administered 2023-06-04: 2 via TRANSDERMAL
  Filled 2023-06-04: qty 2

## 2023-06-04 MED ORDER — SODIUM CHLORIDE 0.9 % IV BOLUS
500.0000 mL | Freq: Once | INTRAVENOUS | Status: AC
  Administered 2023-06-04: 500 mL via INTRAVENOUS

## 2023-06-04 MED ORDER — SODIUM CHLORIDE (PF) 0.9 % IJ SOLN
INTRAMUSCULAR | Status: AC
  Filled 2023-06-04: qty 50

## 2023-06-04 MED ORDER — LIDOCAINE 5 % EX PTCH: 1.0000 | MEDICATED_PATCH | CUTANEOUS | 0 refills | Status: DC

## 2023-06-04 NOTE — ED Provider Notes (Signed)
 Patient signed out to myself by Barrie Dunker, PA-C at shift change pending CT angio head and UA.  Please refer to their note for full HPI, ROS, PE, and MDM.  Patient past medical history of BCC with metastasis to bone presents today after syncope with fall.  Patient was using the restroom, having an episode of diarrhea which he has frequently, stood up and felt nauseated.  Patient was trying to go lay down he had a syncopal episode.  Patient complains of bilateral flank pain and tenderness, unsure of head injury.  Patient blood thinner use.   Previous imaging shows acute L1-L2 right transverse process fractures and right nondisplaced 12th rib fracture.  Patient was also found to have a mass in his neck with possible pressure on C1 vertebrae, CT angio head neck pending to rule out any type of vessel occlusion.  Physical Exam  BP 122/73 (BP Location: Left Arm)   Pulse 68   Temp 97.6 F (36.4 C) (Oral)   Resp 18   SpO2 97%   Physical Exam Vitals and nursing note reviewed.  Constitutional:      General: He is not in acute distress.    Appearance: He is not toxic-appearing.  HENT:     Head: Normocephalic and atraumatic.     Nose: Nose normal.  Eyes:     Extraocular Movements: Extraocular movements intact.  Cardiovascular:     Rate and Rhythm: Normal rate.  Pulmonary:     Effort: Pulmonary effort is normal. No respiratory distress.  Musculoskeletal:     Cervical back: Neck supple.  Skin:    General: Skin is warm and dry.     Capillary Refill: Capillary refill takes less than 2 seconds.  Neurological:     General: No focal deficit present.     Mental Status: He is alert.     Procedures  Procedures  ED Course / MDM    Medical Decision Making Amount and/or Complexity of Data Reviewed Labs: ordered. Radiology: ordered.  Risk OTC drugs. Prescription drug management.   UA: No notable findings CT angio head and neck: No emergent vascular finding.  The skull base mass does  not compromise the right vertebral artery.  Ordinary atheromatous change without significant cystinosis of the major arteries in the head and neck   Considered for  admission or further workup however patient's vital signs, physical exam, labs, and imaging were reassuring.  Patient's pain likely due to acute transverse process fractures, and nondisplaced 12th rib fracture.  Patient's pain will be treated with over-the-counter analgesics.  Patient also prescribed incentive spirometer and Lidoderm patches to maintain adequate lung expansion and or pain.  I feel patient is safe for discharge at this time.    Dolphus Jenny, PA-C 06/04/23 0747    Wynetta Fines, MD 06/04/23 [phone]

## 2023-06-04 NOTE — ED Notes (Signed)
 Patient transported to CT

## 2023-06-04 NOTE — ED Triage Notes (Signed)
 Pt arrived via EMS from home w/ c/o of a fall. Pt reports having episodes of diarrhea which is normal for him. While ambulating to sink pt had possible unwitnessed syncopal episode. Unknown if pt hit head. Bruising to left flank noted. Pt reports back hurts with movement. Pt has a hx of bone cancer.

## 2023-06-04 NOTE — Discharge Instructions (Addendum)
 You may alternate acetaminophen (650-1000mg ) and ibuprofen (400mg ) as needed for pain control. Lidoderm patches sent to pharmacy.

## 2023-06-04 NOTE — ED Provider Notes (Signed)
 [place] EMERGENCY DEPARTMENT AT [facility] Provider Note   CSN: [id number] Arrival date & time: 06/04/23  9528     History  No chief complaint on file.   Martin Curry is a 89 y.o. male.  Patient presents to the emergency room via EMS complaining of syncope with a fall.  He states that he has frequent diarrhea which is normal for him.  He was going to the bathroom for the same and after having an episode of diarrhea standing up and felt nauseated.  He was trying to go lay down as this is typically helped with his nausea and apparently had a syncopal episode.  He awakened on the bathroom floor laying on the tile with his head resting against a built-in Jacuzzi tub.  He complains of bilateral flank pain and tenderness.  He is unsure if he hit his head.  He denies blood thinner usage.  Patient with history of basal cell carcinoma with metastasis to bone currently on immunotherapy, blindness in left eye, prostate cancer, hypertension  HPI     Home Medications Prior to Admission medications   Medication Sig Start Date End Date Taking? Authorizing Provider  amLODipine (NORVASC) 5 MG tablet Take 5 mg by mouth daily.  04/17/19   [provider]  aspirin 81 MG tablet Take 81 mg by mouth daily.    [provider]  benazepril (LOTENSIN) 10 MG tablet Take 10 mg by mouth daily.    [provider]  famotidine-calcium carbonate-magnesium hydroxide (PEPCID COMPLETE) 10-800-165 MG chewable tablet Take one tablet PRN 01/04/18   [provider]  levothyroxine (SYNTHROID) 25 MCG tablet Take 25 mcg by mouth daily before breakfast. 2 tABS M,W,F and one the other days    [provider]  lidocaine-prilocaine (EMLA) cream Apply 1 Application topically as needed. 04/07/23   Jaci Standard, MD  Multiple Vitamin (MULTIVITAMIN) tablet Take 1 tablet by mouth daily.    [provider]  ondansetron (ZOFRAN) 8 MG tablet Take 1 tablet (8 mg total)  by mouth every 8 (eight) hours as needed for nausea or vomiting. Patient not taking: Reported on 02/08/2023 11/11/22   Jaci Standard, MD  potassium citrate (UROCIT-K) 10 MEQ (1080 MG) SR tablet Take 10 mEq by mouth 3 (three) times daily with meals.    [provider]  simvastatin (ZOCOR) 20 MG tablet Take 20 mg by mouth every evening.    [provider]  promethazine (PHENERGAN) 25 MG suppository Place 1 suppository (25 mg total) rectally every 6 (six) hours as needed for nausea or vomiting. Patient not taking: Reported on 05/31/2019 05/23/19 08/18/19  Serena Colonel, MD      Allergies    Methylprednisolone, Penicillins, Latex, and Tape    Review of Systems   Review of Systems  Physical Exam Updated Vital Signs BP 122/73 (BP Location: Left Arm)   Pulse 68   Temp 97.6 F (36.4 C) (Oral)   Resp 18   SpO2 97%  Physical Exam Vitals and nursing note reviewed.  Constitutional:      General: He is not in acute distress.    Appearance: He is well-developed.  HENT:     Head: Normocephalic and atraumatic.  Eyes:     General:        Right eye: No discharge.        Left eye: No discharge.     Conjunctiva/sclera: Conjunctivae normal.  Cardiovascular:     Rate and  Rhythm: Normal rate and regular rhythm.  Pulmonary:     Effort: Pulmonary effort is normal. No respiratory distress.     Breath sounds: Normal breath sounds.  Abdominal:     Palpations: Abdomen is soft.     Tenderness: There is no abdominal tenderness.  Musculoskeletal:        General: Tenderness present. No swelling.     Cervical back: Neck supple.     Comments: Tenderness to palpation of the right flank.  No pain with passive range of motion of bilateral hips.  No shortening or rotation appreciated in bilateral lower extremities.  Bilateral upper extremities with normal range of motion  Skin:    General: Skin is warm and dry.     Capillary Refill: Capillary refill takes less than 2 seconds.     Findings:  Bruising present.     Comments: Bruising noted to bilateral flanks, left upper back  Neurological:     Mental Status: He is alert.  Psychiatric:        Mood and Affect: Mood normal.     ED Results / Procedures / Treatments   Labs (all labs ordered are listed, but only abnormal results are displayed) Labs Reviewed  BASIC METABOLIC PANEL WITH GFR - Abnormal; Notable for the following components:      Result Value   Glucose, Bld 137 (*)    BUN 28 (*)    Creatinine, Ser 1.38 (*)    GFR, Estimated 49 (*)    All other components within normal limits  CBC WITH DIFFERENTIAL/PLATELET - Abnormal; Notable for the following components:   WBC 14.1 (*)    Neutro Abs 12.9 (*)    Lymphs Abs 0.4 (*)    All other components within normal limits  CK  URINALYSIS, ROUTINE W REFLEX MICROSCOPIC    EKG None  Radiology DG Pelvis 1-2 Views Result Date: 06/04/2023 CLINICAL DATA:  Right-sided pelvic pain after fall EXAM: PELVIS - 1 VIEW COMPARISON:  None Available. FINDINGS: There is no evidence of pelvic fracture or diastasis. No pelvic bone lesions are seen. Generalized osteopenia. Putative pelvic lymphadenectomy clips. IMPRESSION: No evidence of injury Electronically Signed   By: Tiburcio Pea [name]   On: 06/04/2023 05:58   CT Head Wo Contrast Result Date: 06/04/2023 CLINICAL DATA:  Possible syncopal episode causing patient to fall hitting his head. Complains of back pain. EXAM: CT HEAD WITHOUT CONTRAST CT CERVICAL SPINE WITHOUT CONTRAST TECHNIQUE: Multidetector CT imaging of the head and cervical spine was performed following the standard protocol without intravenous contrast. Multiplanar CT image reconstructions of the cervical spine were also generated. RADIATION DOSE REDUCTION: This exam was performed according to the departmental dose-optimization program which includes automated exposure control, adjustment of the mA and/or kV according to patient size and/or use of iterative reconstruction  technique. COMPARISON:  PET-CT from 05/03/2023. FINDINGS: CT HEAD FINDINGS Brain: No evidence of acute infarction, hemorrhage, hydrocephalus, extra-axial collection or mass lesion/mass effect. Mild patchy low attenuation within the subcortical white matter compatible with changes secondary to chronic small vessel ischemic disease. Vascular: No hyperdense vessel or unexpected calcification. Skull: Nails scratch set no acute skull fracture identified. As noted on the PET-CT there is a large destructive mass involving the right upper neck. Tumor involves the inferior portion of the right occipital bone. There is associated right mastoid air cell effusion. Sinuses/Orbits: The paranasal sinuses and left mastoid air cells are clear. Other: None CT CERVICAL SPINE FINDINGS Alignment: Alignment of the cervical spine appears  normal. Skull base and vertebrae: Destructive mass involving the right occipital bone, right mastoid bone and lateral mass of C1. This measures approximately 4.8 x 3.2 by 4.2 cm. This is similar to the PET-CT from 05/03/2023.The tumor erodes into the right transverse foramen of the C1 vertebra. Patency of the vertebral artery at this level cannot be assessed reflecting lack of IV contrast material. Soft tissues and spinal canal: No prevertebral fluid or swelling. No visible canal hematoma. Disc levels: Mild multilevel disc space narrowing with endplate spurring. Upper chest: Bilateral pleuroparenchymal scarring noted within the imaged portions of the upper lung zones. Other: 2.3 x 2.2 cm nodule within right lobe of thyroid gland is again noted. This has remained unchanged since 02/02/2020. IMPRESSION: 1. No acute intracranial abnormalities. 2. Mild chronic small vessel ischemic disease. 3. No acute cervical spine fracture. 4. Right-side of neck mass involving the right occipital bone, right mastoid bone and lateral mass of C1. This is similar to the PET-CT from 05/03/2023. The tumor erodes into the right  transverse foramen of the C1 vertebra. Patency of the right vertebral artery at this level cannot be assessed reflecting lack of IV contrast material. 5. No evidence for cervical spine fracture or subluxation. Electronically Signed   By: Signa Kell [name]   On: 06/04/2023 05:38   CT Cervical Spine Wo Contrast Result Date: 06/04/2023 CLINICAL DATA:  Possible syncopal episode causing patient to fall hitting his head. Complains of back pain. EXAM: CT HEAD WITHOUT CONTRAST CT CERVICAL SPINE WITHOUT CONTRAST TECHNIQUE: Multidetector CT imaging of the head and cervical spine was performed following the standard protocol without intravenous contrast. Multiplanar CT image reconstructions of the cervical spine were also generated. RADIATION DOSE REDUCTION: This exam was performed according to the departmental dose-optimization program which includes automated exposure control, adjustment of the mA and/or kV according to patient size and/or use of iterative reconstruction technique. COMPARISON:  PET-CT from 05/03/2023. FINDINGS: CT HEAD FINDINGS Brain: No evidence of acute infarction, hemorrhage, hydrocephalus, extra-axial collection or mass lesion/mass effect. Mild patchy low attenuation within the subcortical white matter compatible with changes secondary to chronic small vessel ischemic disease. Vascular: No hyperdense vessel or unexpected calcification. Skull: Nails scratch set no acute skull fracture identified. As noted on the PET-CT there is a large destructive mass involving the right upper neck. Tumor involves the inferior portion of the right occipital bone. There is associated right mastoid air cell effusion. Sinuses/Orbits: The paranasal sinuses and left mastoid air cells are clear. Other: None CT CERVICAL SPINE FINDINGS Alignment: Alignment of the cervical spine appears normal. Skull base and vertebrae: Destructive mass involving the right occipital bone, right mastoid bone and lateral mass of C1. This  measures approximately 4.8 x 3.2 by 4.2 cm. This is similar to the PET-CT from 05/03/2023.The tumor erodes into the right transverse foramen of the C1 vertebra. Patency of the vertebral artery at this level cannot be assessed reflecting lack of IV contrast material. Soft tissues and spinal canal: No prevertebral fluid or swelling. No visible canal hematoma. Disc levels: Mild multilevel disc space narrowing with endplate spurring. Upper chest: Bilateral pleuroparenchymal scarring noted within the imaged portions of the upper lung zones. Other: 2.3 x 2.2 cm nodule within right lobe of thyroid gland is again noted. This has remained unchanged since 02/02/2020. IMPRESSION: 1. No acute intracranial abnormalities. 2. Mild chronic small vessel ischemic disease. 3. No acute cervical spine fracture. 4. Right-side of neck mass involving the right occipital bone, right mastoid bone and  lateral mass of C1. This is similar to the PET-CT from 05/03/2023. The tumor erodes into the right transverse foramen of the C1 vertebra. Patency of the right vertebral artery at this level cannot be assessed reflecting lack of IV contrast material. 5. No evidence for cervical spine fracture or subluxation. Electronically Signed   By: Signa Kell [name]   On: 06/04/2023 05:38   CT Thoracic Spine Wo Contrast Result Date: 06/04/2023 CLINICAL DATA:  Back pain after fall. History of prostate and bone cancer. EXAM: CT THORACIC AND LUMBAR SPINE WITHOUT CONTRAST TECHNIQUE: Multidetector CT imaging of the thoracic and lumbar spine was performed without contrast. Multiplanar CT image reconstructions were also generated. RADIATION DOSE REDUCTION: This exam was performed according to the departmental dose-optimization program which includes automated exposure control, adjustment of the mA and/or kV according to patient size and/or use of iterative reconstruction technique. COMPARISON:  Head CT 05/03/2023 FINDINGS: CT THORACIC SPINE FINDINGS Alignment:  No traumatic malalignment. Vertebrae: No acute fracture. Multilevel thoracic ankylosis from bridging osteophyte. Subjective generalized osteopenia. Sclerotic lesion in the posterior left fifth rib. Paraspinal and other soft tissues: Right thyroid nodule known from PET CT comparison. No acute finding Disc levels: Diffuse thoracic ankylosis from bridging osteophytes. CT LUMBAR SPINE FINDINGS Segmentation: 5 lumbar type vertebrae. Alignment: Normal. Vertebrae: Sclerotic metastases in the L1, L3, L5, and S2/S3 levels. No evidence of extraosseous tumor. L1 and L2 right transverse process fractures. Nondisplaced right twelfth rib fracture. Paraspinal and other soft tissues: No perispinal hematoma. Disc levels: Mild for age degenerative change without bony impingement seen. IMPRESSION: Acute right transverse process fractures at L1 and L2. Nondisplaced right twelfth rib fracture. Osseous metastatic disease, treated based on recent PET-CT. Electronically Signed   By: Tiburcio Pea [name]   On: 06/04/2023 05:36   CT Lumbar Spine Wo Contrast Result Date: 06/04/2023 CLINICAL DATA:  Back pain after fall. History of prostate and bone cancer. EXAM: CT THORACIC AND LUMBAR SPINE WITHOUT CONTRAST TECHNIQUE: Multidetector CT imaging of the thoracic and lumbar spine was performed without contrast. Multiplanar CT image reconstructions were also generated. RADIATION DOSE REDUCTION: This exam was performed according to the departmental dose-optimization program which includes automated exposure control, adjustment of the mA and/or kV according to patient size and/or use of iterative reconstruction technique. COMPARISON:  Head CT 05/03/2023 FINDINGS: CT THORACIC SPINE FINDINGS Alignment: No traumatic malalignment. Vertebrae: No acute fracture. Multilevel thoracic ankylosis from bridging osteophyte. Subjective generalized osteopenia. Sclerotic lesion in the posterior left fifth rib. Paraspinal and other soft tissues: Right thyroid  nodule known from PET CT comparison. No acute finding Disc levels: Diffuse thoracic ankylosis from bridging osteophytes. CT LUMBAR SPINE FINDINGS Segmentation: 5 lumbar type vertebrae. Alignment: Normal. Vertebrae: Sclerotic metastases in the L1, L3, L5, and S2/S3 levels. No evidence of extraosseous tumor. L1 and L2 right transverse process fractures. Nondisplaced right twelfth rib fracture. Paraspinal and other soft tissues: No perispinal hematoma. Disc levels: Mild for age degenerative change without bony impingement seen. IMPRESSION: Acute right transverse process fractures at L1 and L2. Nondisplaced right twelfth rib fracture. Osseous metastatic disease, treated based on recent PET-CT. Electronically Signed   By: Tiburcio Pea [name]   On: 06/04/2023 05:36   DG Chest Portable 1 View Result Date: 06/04/2023 CLINICAL DATA:  Syncope EXAM: PORTABLE CHEST 1 VIEW COMPARISON:  03/20/2005.  PET CT 05/03/2023 FINDINGS: Cardiomegaly. Probable vascular pedicle widening. Porta catheter on the right with tip at the upper right atrium. Congested appearance of vessels bilaterally. No visible effusion or  pneumothorax. IMPRESSION: Cardiomegaly and vascular congestion. Electronically Signed   By: Tiburcio Pea [name]   On: 06/04/2023 04:47    Procedures Procedures    Medications Ordered in ED Medications  sodium chloride 0.9 % bolus 500 mL (500 mLs Intravenous New Bag/Given 06/04/23 0434)  ondansetron (ZOFRAN) injection 4 mg (4 mg Intravenous Given 06/04/23 0433)  iohexol (OMNIPAQUE) 350 MG/ML injection 75 mL (75 mLs Intravenous Contrast Given 06/04/23 7253)    ED Course/ Medical Decision Making/ A&P                                 Medical Decision Making Amount and/or Complexity of Data Reviewed Labs: ordered. Radiology: ordered.  Risk Prescription drug management.   This patient presents to the ED for concern of syncope and collapse, this involves an extensive number of treatment options, and is a  complaint that carries with it a high risk of complications and morbidity.  The differential diagnosis includes intracranial abnormality, fracture, dislocation, soft tissue injury, dysrhythmia, electrolyte abnormality, infection, others   Co morbidities that complicate the patient evaluation  Metastatic cancer   Additional history obtained:  Additional history obtained from EMS and family at bedside External records from outside source obtained and reviewed including oncology notes   Lab Tests:  I Ordered, and personally interpreted labs.  The pertinent results include: Creatinine 1.38 appears to be at patient's baseline; CK 96; WBC 14,100; UA pending   Imaging Studies ordered:  I ordered imaging studies including CT scans without contrast of the head and spine; plain films of the chest and pelvis; CT angio head and neck I independently visualized and interpreted imaging which showed no acute finding on pelvic x-ray, chest x-ray shows cardiomegaly with mild vascular congestion; CT cervical spine and head show: 1. No acute intracranial abnormalities.  2. Mild chronic small vessel ischemic disease.  3. No acute cervical spine fracture.  4. Right-side of neck mass involving the right occipital bone, right  mastoid bone and lateral mass of C1. This is similar to the PET-CT  from 05/03/2023. The tumor erodes into the right transverse foramen  of the C1 vertebra. Patency of the right vertebral artery at this  level cannot be assessed reflecting lack of IV contrast material.  5. No evidence for cervical spine fracture or subluxation.  CT lumbar and thoracic show: Acute right transverse process fractures at L1 and L2. Nondisplaced  right twelfth rib fracture.    Osseous metastatic disease, treated based on recent PET-CT.  CT angio head neck pending I agree with the radiologist interpretation   Cardiac Monitoring: / EKG:  The patient was maintained on a cardiac monitor.  I  personally viewed and interpreted the cardiac monitored which showed an underlying rhythm of: sinus rhythm   Problem List / ED Course / Critical interventions / Medication management   I ordered medication including zofran for nausea, saline bolus for rehydration  Reevaluation of the patient after these medicines showed that the patient improved I have reviewed the patients home medicines and have made adjustments as needed   Test / Admission - Considered:  Patient with acute L1-L2 right transverse process fractures, right nondisplaced 12th rib fracture. Patient needing CT angio to assess for arterial patency. If CT angio is negative patient should be able to discharge home with plan for alternating acetaminophen and ibuprofen. UA pending collection to rule out infection. Patient care being transferred to Melina Modena,  PA-C at shift handoff.          Final Clinical Impression(s) / ED Diagnoses Final diagnoses:  Other closed fracture of lumbar vertebra, unspecified lumbar vertebral level, initial encounter [facility])  Closed fracture of one rib of right side, initial encounter    Rx / DC Orders ED Discharge Orders     None         Pamala Duffel 06/04/23 1610    Nira Conn, MD 06/04/23 [phone]

## 2023-06-08 NOTE — Progress Notes (Unsigned)
 [facility] [phone]   [phone]  PROGRESS NOTE  Patient Care Team: Bertha Broad, MD as PCP - General (Internal Medicine) Homero Luster, MD as Attending Physician (Urology) Colie Dawes, MD as Consulting Physician (Radiation Oncology) Malmfelt, Nancyann Aye, RN as Oncology Nurse Navigator (Oncology) Gaynelle Keeling, MD as Consulting Physician (Dermatology) Daina Drum, MD as Consulting Physician (Gastroenterology) Ander Bame, MD as Consulting Physician (Hematology and Oncology)  Hematological/Oncological History # Bone Lesions on CT/MRI # 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 Additionally there was noted to be a new enhancing lesion at the tip of the right greater trochanter measuring up to 1.7 cm also suspicious for metastatic disease. 09/27/2020: establish care with Dr. Rosaline Coma 10/29/2020: CT biopsy performed of left iliac bone lesion. Pathology showed bone and marrow without evidence of carcinoma.  11/11/2022: Cycle 1 Day 1 of cemplimab therapy.  12/03/2022: Cycle 2 Day 1 of cemplimab therapy.  12/23/2022: Cycle 3 Day 1 of cemplimab therapy.  01/13/2023: Cycle 4 Day 1 of cemplimab therapy.  02/03/2023: Cycle 5 Day 1 of cemplimab therapy.  02/23/2023: Cycle 6 Day 1 of cemplimab therapy.  03/16/2023:  Cycle 7 Day 1 of cemplimab therapy.  04/06/2023: Cycle 8 Day 1 of cemplimab therapy.  04/28/2023: Cycle 9 Day 1 of cemplimab therapy.  05/18/2023: Cycle 10 Day 1 of cemplimab therapy.  06/08/2023: Cycle 11 Day 1 of cemplimab therapy.   Interval History:  Martin Curry 89 y.o. 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 The patient's last visit was on 05/18/2023. In the interim since the last visit he has completed Cycle 10 of cemiplimab and had a syncopal episode on 06/04/2023 at which time he had extensive imaging in the emergency department.  On exam today Martin Curry reports he unfortunately did have an episode of falling after he was having diarrhea.  He reports was in the middle night he was having diarrhea.  He was also feeling nauseous only get back to his bed quickly to lie down.  Unfortunately he syncopized.  He reports he did not hit his head but did wake up sweaty on the floor.  He was taken to the hospital with imaging performed.  No evidence of brain bleed.  No evidence of progression of disease on those scans.  He reports otherwise he has been in good health.  He notes that he is eating well with a strong appetite and his energy levels are "okay".  He notes that he is try to take it slow.  He has not had nausea frequently but did unfortunately vomit the night he syncopized.  He notes he is still having a little bit of discharge from his ear, but it is not bloody.  He is had no infectious symptoms such as runny nose, sore throat, or cough. Overall he feels well and is willing and able to proceed with immunotherapy at this time he denies any fevers, chills, sweats, shortness of breath, chest pain or cough.   A full 10 point ROS is listed below.   MEDICAL HISTORY:  Past  Medical History:  Diagnosis Date   Atypical nevus 09/07/2002   left abdomen-slight   BCC (basal cell carcinoma of skin) 07/19/2014   left sideburn- +margin-exc.   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 if orignated from skin cancer   Nodular basal cell carcinoma (BCC) 11/21/2012   right side of scalp-CX35FU   Nodular basal cell carcinoma (BCC) 06/12/2014   left sideburn-CX35FU/exc.   Nodular basal cell carcinoma (BCC) 07/16/2015   left scalp-CX35FU   Polycythemia    Prostate cancer (HCC) 1990's   total prostatectomy and adjuvant radiation. Last PSA was 0.02 in 09/2011   SCC (squamous cell carcinoma) 04/03/2009   forehead-txpbx   SCC (squamous cell carcinoma) 07/16/2015   left front scalp   SCC (squamous cell carcinoma) 07/16/2015   left front scalp-Cx35FU   SCCA (squamous cell carcinoma) of skin 12/20/2019   Right forehead (in situ)   SCCA (squamous cell carcinoma) of skin 05/27/2020   Mid Parietal Scalp (in situ)   Superficial basal cell carcinoma (BCC) 11/21/2012   behind left ear-CX35FU, midback-CX35FU, right forehead-CX35FU-exc   Superficial basal cell carcinoma (BCC) 11/02/2017   left post neck-CX35FU   Superficial nodular basal cell carcinoma (BCC) 12/20/2019   Left temporal scalp   Superficial nodular basal cell carcinoma (BCC) 05/27/2020   Right Preauricular area (curet and 5FU)    SURGICAL HISTORY: Past Surgical History:  Procedure Laterality Date   ADJACENT TISSUE TRANSFER/TISSUE REARRANGEMENT N/A 05/22/2019   Procedure: COMPLEX CLOSURE OF NECK WOUND;  Surgeon: Barb Bonito, MD;  Location: [facility] OR;  Service: Plastics;  Laterality: N/A;   ALLOGRAFT APPLICATION N/A 05/22/2019   Procedure: POSSIBLE FACIAL NERVE RECONSTRUCTION WITH NERVE ALLOGRAFT VS AUTOGRAFT;  Surgeon: Barb Bonito, MD;  Location: [facility] OR;  Service: Plastics;  Laterality: N/A;   BLADDER SURGERY     due to prostatectomy.    CATARACT  EXTRACTION     right   COLONOSCOPY     neg in the past; due in 2014 with [place] Dr. Adan Holms   EYE SURGERY     1950   HERNIA REPAIR     IR IMAGING GUIDED PORT INSERTION  11/30/2022   MOHS SURGERY     NECK DISSECTION  05/22/2019    NECK DISSECTION   PAROTIDECTOMY  05/22/2019   PAROTIDECTOMY with facial nerve dissection    PAROTIDECTOMY Right 05/22/2019   Procedure: PAROTIDECTOMY;  Surgeon: Janita Mellow, MD;  Location: [facility] OR;  Service: ENT;  Laterality: Right;   PROSTATE SURGERY     RADICAL NECK DISSECTION Right 05/22/2019   Procedure: RADICAL NECK DISSECTION;  Surgeon: Janita Mellow, MD;  Location: [facility] OR;  Service: ENT;  Laterality: Right;   TONSILLECTOMY      SOCIAL HISTORY: Social History   Socioeconomic History   Marital status: Married    Spouse name: Not on file   Number of children: 3   Years of education: Not  on file   Highest education level: Not on file  Occupational History    Comment: retired Scientist, 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   Occupation: retired  Tobacco Use   Smoking status: Never   Smokeless tobacco: Never  Vaping Use   Vaping status: Never Used  Substance and Sexual Activity   Alcohol use: No   Drug use: No   Sexual activity: Not on file  Other Topics Concern   Not on file  Social History Narrative   Not on file   Social Drivers of Health   Financial Resource Strain: Not on file  Food Insecurity: No Food Insecurity (10/29/2022)   Hunger Vital Sign    Worried About Running Out of Food in the Last Year: Never true    Ran Out of Food in the Last Year: Never true  Transportation Needs: No Transportation Needs (10/29/2022)   PRAPARE - Administrator, Civil Service (Medical): No    Lack of Transportation (Non-Medical): No  Physical Activity: Not on file  Stress: Not on file  Social Connections: Not on file  Intimate Partner Violence: Not At Risk (10/29/2022)   Humiliation, Afraid, Rape, and Kick questionnaire    Fear of Current or  Ex-Partner: No    Emotionally Abused: No    Physically Abused: No    Sexually Abused: No    FAMILY HISTORY: Family History  Problem Relation Age of Onset   Uterine cancer Mother 73   Ovarian cancer Mother    Lung cancer Father 75   Prostate cancer Brother    Prostate cancer Brother    Colon cancer Neg Hx    Stomach cancer Neg Hx    Esophageal cancer Neg Hx    Rectal cancer Neg Hx     ALLERGIES:  is allergic to methylprednisolone, penicillins, latex, and tape.  MEDICATIONS:  Current Outpatient Medications  Medication Sig Dispense Refill   amLODipine (NORVASC) 5 MG tablet Take 5 mg by mouth daily.      aspirin 81 MG tablet Take 81 mg by mouth daily.     benazepril (LOTENSIN) 10 MG tablet Take 10 mg by mouth daily.     famotidine-calcium carbonate-magnesium hydroxide (PEPCID COMPLETE) 10-800-165 MG chewable tablet Take one tablet PRN     levothyroxine (SYNTHROID) 25 MCG tablet Take 25 mcg by mouth daily before breakfast. 2 tABS M,W,F and one the other days     lidocaine (LIDODERM) 5 % Place 1 patch onto the skin daily. Remove & Discard patch within 12 hours or as directed by MD 30 patch 0   lidocaine-prilocaine (EMLA) cream Apply 1 Application topically as needed. 30 g 0   Multiple Vitamin (MULTIVITAMIN) tablet Take 1 tablet by mouth daily.     ondansetron (ZOFRAN) 8 MG tablet Take 1 tablet (8 mg total) by mouth every 8 (eight) hours as needed for nausea or vomiting. (Patient not taking: Reported on 02/08/2023) 20 tablet 1   potassium citrate (UROCIT-K) 10 MEQ (1080 MG) SR tablet Take 10 mEq by mouth 3 (three) times daily with meals.     simvastatin (ZOCOR) 20 MG tablet Take 20 mg by mouth every evening.     No current facility-administered medications for this visit.    REVIEW OF SYSTEMS:   Constitutional: ( - ) fevers, ( - )  chills , ( - ) night sweats Eyes: ( - ) blurriness of vision, ( - ) double vision, ( - ) watery eyes Ears, nose, mouth, throat, and face: ( - )  mucositis, ( - ) sore throat Respiratory: ( - ) cough, ( - ) dyspnea, ( - ) wheezes Cardiovascular: ( - ) palpitation, ( - ) chest discomfort, ( - ) lower extremity swelling Gastrointestinal:  ( - ) nausea, ( - ) heartburn, ( - ) change in bowel habits Skin: ( - ) abnormal skin rashes Lymphatics: ( - ) new lymphadenopathy, ( - ) easy bruising Neurological: ( - ) numbness, ( - ) tingling, ( - ) new weaknesses Behavioral/Psych: ( - ) mood change, ( - ) new changes  All other systems were reviewed with the patient and are negative.  PHYSICAL EXAMINATION: ECOG PERFORMANCE STATUS: 1 - Symptomatic but completely ambulatory  There were no vitals filed for this visit.   There were no vitals filed for this visit.    GENERAL: Well-appearing elderly Caucasian male, alert, no distress and comfortable SKIN: skin color, texture, turgor are normal, no rashes or significant lesions EYES: conjunctiva are pink and non-injected, sclera clear LUNGS: clear to auscultation and percussion with normal breathing effort HEART: regular rate & rhythm and no murmurs and no lower extremity edema Musculoskeletal: no cyanosis of digits and no clubbing  PSYCH: alert & oriented x 3, fluent speech NEURO: no focal motor/sensory deficits  LABORATORY DATA:  I have reviewed the data as listed    Latest Ref Rng & Units 06/04/2023    4:32 AM 05/18/2023   10:23 AM 04/28/2023    9:35 AM  CBC  WBC 4.0 - 10.5 K/uL 14.1  10.9  7.0   Hemoglobin 13.0 - 17.0 g/dL 40.9  81.1  91.4   Hematocrit 39.0 - 52.0 % 46.3  45.9  44.7   Platelets 150 - 400 K/uL 238  261  219        Latest Ref Rng & Units 06/04/2023    4:32 AM 05/18/2023   10:23 AM 04/28/2023    9:35 AM  CMP  Glucose 70 - 99 mg/dL 782  956  213   BUN 8 - 23 mg/dL 28  23  19    Creatinine 0.61 - 1.24 mg/dL 0.86  5.78  4.69   Sodium 135 - 145 mmol/L 137  139  140   Potassium 3.5 - 5.1 mmol/L 4.2  4.4  4.2   Chloride 98 - 111 mmol/L 107  108  109   CO2 22 - 32 mmol/L  22  26  26    Calcium 8.9 - 10.3 mg/dL 9.3  9.9  9.7   Total Protein 6.5 - 8.1 g/dL  7.1  6.9   Total Bilirubin 0.0 - 1.2 mg/dL  0.7  0.6   Alkaline Phos 38 - 126 U/L  78  76   AST 15 - 41 U/L  20  19   ALT 0 - 44 U/L  21  20     Lab Results  Component Value Date   MPROTEIN Not Observed 09/27/2020   Lab Results  Component Value Date   KPAFRELGTCHN 25.6 (H) 09/27/2020   LAMBDASER 23.0 09/27/2020   KAPLAMBRATIO 1.11 09/27/2020    RADIOGRAPHIC STUDIES: CT ANGIO HEAD NECK W WO CM Result Date: 06/04/2023 CLINICAL DATA:  Possible syncopal episode causing fall. History of C1 mass EXAM: CT ANGIOGRAPHY HEAD AND NECK WITH AND WITHOUT CONTRAST TECHNIQUE: Multidetector CT imaging of the head and neck was performed using the standard protocol during bolus administration of intravenous contrast. Multiplanar CT image reconstructions and MIPs were obtained to evaluate the vascular anatomy. Carotid stenosis measurements (  when applicable) are obtained utilizing NASCET criteria, using the distal internal carotid diameter as the denominator. RADIATION DOSE REDUCTION: This exam was performed according to the departmental dose-optimization program which includes automated exposure control, adjustment of the mA and/or kV according to patient size and/or use of iterative reconstruction technique. CONTRAST:  75mL OMNIPAQUE IOHEXOL 350 MG/ML SOLN COMPARISON:  No preceding CTA comparison FINDINGS: CTA NECK FINDINGS Aortic arch: Atheromatous plaque.  No acute finding Right carotid system: Low-density atheromatous wall thickening diffusely with calcified plaque at the bifurcation. No stenosis or ulceration. Left carotid system: Moderate calcified plaque at the bifurcation without stenosis or ulceration. Vertebral arteries: Proximal subclavian atherosclerosis without significant stenosis. Both vertebral arteries are smoothly contoured and diffusely patent, including on the right despite a skull base and upper cervical mass  with bony destruction. Skeleton: No new mass centered at the right occipito temporal skull base also eroding the C1 right lateral mass and infiltrating the adjacent soft tissues. Reference recent PET. Other neck: As above. Known right thyroid nodule, reference prior PET. No acute finding Upper chest: No acute finding Review of the MIP images confirms the above findings CTA HEAD FINDINGS Anterior circulation: No significant stenosis, proximal occlusion, aneurysm, or vascular malformation. Atheromatous calcification of the cavernous carotids. Posterior circulation: Calcified plaque involving the left more than right vertebral artery. Fetal type left PCA. No branch occlusion, beading, or aneurysm Venous sinuses: Patent as permitted by contrast timing. Anatomic variants: None significant Review of the MIP images confirms the above findings IMPRESSION: No emergent vascular finding. The skull base mass does not compromise the right vertebral artery. Ordinary atheromatous change without significant stenosis of major arteries in the head and neck. Electronically Signed   By: Ronnette Coke [name]   On: 06/04/2023 06:43   DG Pelvis 1-2 Views Result Date: 06/04/2023 CLINICAL DATA:  Right-sided pelvic pain after fall EXAM: PELVIS - 1 VIEW COMPARISON:  None Available. FINDINGS: There is no evidence of pelvic fracture or diastasis. No pelvic bone lesions are seen. Generalized osteopenia. Putative pelvic lymphadenectomy clips. IMPRESSION: No evidence of injury Electronically Signed   By: Ronnette Coke [name]   On: 06/04/2023 05:58   CT Head Wo Contrast Result Date: 06/04/2023 CLINICAL DATA:  Possible syncopal episode causing patient to fall hitting his head. Complains of back pain. EXAM: CT HEAD WITHOUT CONTRAST CT CERVICAL SPINE WITHOUT CONTRAST TECHNIQUE: Multidetector CT imaging of the head and cervical spine was performed following the standard protocol without intravenous contrast. Multiplanar CT image reconstructions  of the cervical spine were also generated. RADIATION DOSE REDUCTION: This exam was performed according to the departmental dose-optimization program which includes automated exposure control, adjustment of the mA and/or kV according to patient size and/or use of iterative reconstruction technique. COMPARISON:  PET-CT from 05/03/2023. FINDINGS: CT HEAD FINDINGS Brain: No evidence of acute infarction, hemorrhage, hydrocephalus, extra-axial collection or mass lesion/mass effect. Mild patchy low attenuation within the subcortical white matter compatible with changes secondary to chronic small vessel ischemic disease. Vascular: No hyperdense vessel or unexpected calcification. Skull: Nails scratch set no acute skull fracture identified. As noted on the PET-CT there is a large destructive mass involving the right upper neck. Tumor involves the inferior portion of the right occipital bone. There is associated right mastoid air cell effusion. Sinuses/Orbits: The paranasal sinuses and left mastoid air cells are clear. Other: None CT CERVICAL SPINE FINDINGS Alignment: Alignment of the cervical spine appears normal. Skull base and vertebrae: Destructive mass involving the right occipital bone, right  mastoid bone and lateral mass of C1. This measures approximately 4.8 x 3.2 by 4.2 cm. This is similar to the PET-CT from 05/03/2023.The tumor erodes into the right transverse foramen of the C1 vertebra. Patency of the vertebral artery at this level cannot be assessed reflecting lack of IV contrast material. Soft tissues and spinal canal: No prevertebral fluid or swelling. No visible canal hematoma. Disc levels: Mild multilevel disc space narrowing with endplate spurring. Upper chest: Bilateral pleuroparenchymal scarring noted within the imaged portions of the upper lung zones. Other: 2.3 x 2.2 cm nodule within right lobe of thyroid gland is again noted. This has remained unchanged since 02/02/2020. IMPRESSION: 1. No acute  intracranial abnormalities. 2. Mild chronic small vessel ischemic disease. 3. No acute cervical spine fracture. 4. Right-side of neck mass involving the right occipital bone, right mastoid bone and lateral mass of C1. This is similar to the PET-CT from 05/03/2023. The tumor erodes into the right transverse foramen of the C1 vertebra. Patency of the right vertebral artery at this level cannot be assessed reflecting lack of IV contrast material. 5. No evidence for cervical spine fracture or subluxation. Electronically Signed   By: Kimberley Penman [name]   On: 06/04/2023 05:38   CT Cervical Spine Wo Contrast Result Date: 06/04/2023 CLINICAL DATA:  Possible syncopal episode causing patient to fall hitting his head. Complains of back pain. EXAM: CT HEAD WITHOUT CONTRAST CT CERVICAL SPINE WITHOUT CONTRAST TECHNIQUE: Multidetector CT imaging of the head and cervical spine was performed following the standard protocol without intravenous contrast. Multiplanar CT image reconstructions of the cervical spine were also generated. RADIATION DOSE REDUCTION: This exam was performed according to the departmental dose-optimization program which includes automated exposure control, adjustment of the mA and/or kV according to patient size and/or use of iterative reconstruction technique. COMPARISON:  PET-CT from 05/03/2023. FINDINGS: CT HEAD FINDINGS Brain: No evidence of acute infarction, hemorrhage, hydrocephalus, extra-axial collection or mass lesion/mass effect. Mild patchy low attenuation within the subcortical white matter compatible with changes secondary to chronic small vessel ischemic disease. Vascular: No hyperdense vessel or unexpected calcification. Skull: Nails scratch set no acute skull fracture identified. As noted on the PET-CT there is a large destructive mass involving the right upper neck. Tumor involves the inferior portion of the right occipital bone. There is associated right mastoid air cell effusion.  Sinuses/Orbits: The paranasal sinuses and left mastoid air cells are clear. Other: None CT CERVICAL SPINE FINDINGS Alignment: Alignment of the cervical spine appears normal. Skull base and vertebrae: Destructive mass involving the right occipital bone, right mastoid bone and lateral mass of C1. This measures approximately 4.8 x 3.2 by 4.2 cm. This is similar to the PET-CT from 05/03/2023.The tumor erodes into the right transverse foramen of the C1 vertebra. Patency of the vertebral artery at this level cannot be assessed reflecting lack of IV contrast material. Soft tissues and spinal canal: No prevertebral fluid or swelling. No visible canal hematoma. Disc levels: Mild multilevel disc space narrowing with endplate spurring. Upper chest: Bilateral pleuroparenchymal scarring noted within the imaged portions of the upper lung zones. Other: 2.3 x 2.2 cm nodule within right lobe of thyroid gland is again noted. This has remained unchanged since 02/02/2020. IMPRESSION: 1. No acute intracranial abnormalities. 2. Mild chronic small vessel ischemic disease. 3. No acute cervical spine fracture. 4. Right-side of neck mass involving the right occipital bone, right mastoid bone and lateral mass of C1. This is similar to the PET-CT from 05/03/2023. The  tumor erodes into the right transverse foramen of the C1 vertebra. Patency of the right vertebral artery at this level cannot be assessed reflecting lack of IV contrast material. 5. No evidence for cervical spine fracture or subluxation. Electronically Signed   By: Signa Kell [name]   On: 06/04/2023 05:38   CT Thoracic Spine Wo Contrast Result Date: 06/04/2023 CLINICAL DATA:  Back pain after fall. History of prostate and bone cancer. EXAM: CT THORACIC AND LUMBAR SPINE WITHOUT CONTRAST TECHNIQUE: Multidetector CT imaging of the thoracic and lumbar spine was performed without contrast. Multiplanar CT image reconstructions were also generated. RADIATION DOSE REDUCTION: This exam  was performed according to the departmental dose-optimization program which includes automated exposure control, adjustment of the mA and/or kV according to patient size and/or use of iterative reconstruction technique. COMPARISON:  Head CT 05/03/2023 FINDINGS: CT THORACIC SPINE FINDINGS Alignment: No traumatic malalignment. Vertebrae: No acute fracture. Multilevel thoracic ankylosis from bridging osteophyte. Subjective generalized osteopenia. Sclerotic lesion in the posterior left fifth rib. Paraspinal and other soft tissues: Right thyroid nodule known from PET CT comparison. No acute finding Disc levels: Diffuse thoracic ankylosis from bridging osteophytes. CT LUMBAR SPINE FINDINGS Segmentation: 5 lumbar type vertebrae. Alignment: Normal. Vertebrae: Sclerotic metastases in the L1, L3, L5, and S2/S3 levels. No evidence of extraosseous tumor. L1 and L2 right transverse process fractures. Nondisplaced right twelfth rib fracture. Paraspinal and other soft tissues: No perispinal hematoma. Disc levels: Mild for age degenerative change without bony impingement seen. IMPRESSION: Acute right transverse process fractures at L1 and L2. Nondisplaced right twelfth rib fracture. Osseous metastatic disease, treated based on recent PET-CT. Electronically Signed   By: Tiburcio Pea [name]   On: 06/04/2023 05:36   CT Lumbar Spine Wo Contrast Result Date: 06/04/2023 CLINICAL DATA:  Back pain after fall. History of prostate and bone cancer. EXAM: CT THORACIC AND LUMBAR SPINE WITHOUT CONTRAST TECHNIQUE: Multidetector CT imaging of the thoracic and lumbar spine was performed without contrast. Multiplanar CT image reconstructions were also generated. RADIATION DOSE REDUCTION: This exam was performed according to the departmental dose-optimization program which includes automated exposure control, adjustment of the mA and/or kV according to patient size and/or use of iterative reconstruction technique. COMPARISON:  Head CT 05/03/2023  FINDINGS: CT THORACIC SPINE FINDINGS Alignment: No traumatic malalignment. Vertebrae: No acute fracture. Multilevel thoracic ankylosis from bridging osteophyte. Subjective generalized osteopenia. Sclerotic lesion in the posterior left fifth rib. Paraspinal and other soft tissues: Right thyroid nodule known from PET CT comparison. No acute finding Disc levels: Diffuse thoracic ankylosis from bridging osteophytes. CT LUMBAR SPINE FINDINGS Segmentation: 5 lumbar type vertebrae. Alignment: Normal. Vertebrae: Sclerotic metastases in the L1, L3, L5, and S2/S3 levels. No evidence of extraosseous tumor. L1 and L2 right transverse process fractures. Nondisplaced right twelfth rib fracture. Paraspinal and other soft tissues: No perispinal hematoma. Disc levels: Mild for age degenerative change without bony impingement seen. IMPRESSION: Acute right transverse process fractures at L1 and L2. Nondisplaced right twelfth rib fracture. Osseous metastatic disease, treated based on recent PET-CT. Electronically Signed   By: Tiburcio Pea [name]   On: 06/04/2023 05:36   DG Chest Portable 1 View Result Date: 06/04/2023 CLINICAL DATA:  Syncope EXAM: PORTABLE CHEST 1 VIEW COMPARISON:  03/20/2005.  PET CT 05/03/2023 FINDINGS: Cardiomegaly. Probable vascular pedicle widening. Porta catheter on the right with tip at the upper right atrium. Congested appearance of vessels bilaterally. No visible effusion or pneumothorax. IMPRESSION: Cardiomegaly and vascular congestion. Electronically Signed   By: Christiane Ha  Watts [name]   On: 06/04/2023 04:47     ASSESSMENT & PLAN Martin Curry is a 89 y.o. 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  At this time the patient's clinical findings are complicated.  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  It is unclear if these 2 processes are the same or 2 separate primaries.  Additionally there is no clear evidence of a primary squamous cell carcinoma (however my concern is that the area behind the right ear may represent the same process).  Patient also does have PET avid areas of his bowels which I do not suspect are related, but we will have evaluated further with EGD/colonoscopy as needed.  At this time the best treatment course would be to treat with Cemiplimab which should be effective at treating both basal cell carcinoma and squamous cell carcinoma, effectively treating both even if they are separate processes.  Overall he voices understanding of this complicated situation and our plan moving forward.  Of note radiation oncology and ENT are on board and willing to assist with management of his current findings.  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 Previously discussed with radiology who notes a repeat biopsy was not recommended and repeat imaging would be preferred.  --negative multiple myeloma labs with SPEP and serum free light chains showing no evidence of monoclonal gammopathy.  --Given his prior history of prostate cancer we ordered a PSA, which was normal. Repeated PSA previously, found to be within normal limits --CT C/A/P shows no evidence of disease elsewhere in the body in August 2022. Most recent PET CT scan on 10/02/2022 showed hypermetabolic osseous metastasis with a dominant mass centered about the inferior medial portion of the right mastoid, suspicious for site of primary squamous cell carcinoma. -- PET CT scan shows evidence of lesion behind the right ear as well as the known pelvic metastasis. -- Reviewed tissue with pathology to determine if both lesions are the same pathology or different origins.  At this time findings are most consistent with a  basal cell primary behind his ear and squamous cell carcinoma of the pelvis.  It is possible these at the same pathology with the basal/squamous crossover, though they may represent separate primaries.  Either way Cemiplimab therapy should be adequate to treat both. -- PD-L1 testing showed 0% PD-L1 score, however the use of cemiplimab does not require PD-L1 positivity (lack of PD-L1 prevents the use of pembrolizumab)  PLAN: --Proceed with cycle 11 day 1 of Cemiplimab immunotherapy today --Labs today show white blood cell count 8.3, hemoglobin 15.6, MCV 89.5, platelets 252 --PET CT scan on 01/25/2023 showed response to therapy as evidenced by decrease in FDG avidity within a destructive soft tissue mass involving the right mastoid as well as within osseous metastases.  Also previously noted hypermetabolic cecal mass, highly worrisome for colon carcinoma (colonoscopy confirmed polyps with no active malignancy . PET CT scan on 05/03/2023 showed no new or progressive disease with overall response in most areas.  -- Return to clinic for next Cycle of cemiplimab in 3 weeks time.   # Bleeding of the Ear--healed -- secondary due to erosion of the tumor into his  ear canal. -- wound has healed without evidence of bleeding.  --Minor orange color discharge from the ear, no evidence of bleeding. -- monitor for now.   # FDG Avid Colon Lesion --Patient underwent colonoscopy with gastroenterology and found to have large polyps. -- No evidence of colonic malignancy.  No orders of the defined types were placed in this encounter.   All questions were answered. The patient knows to call the clinic with any problems, questions or concerns.  A total of more than 30 minutes were spent on this encounter with face-to-face time and non-face-to-face time, including preparing to see the patient, ordering tests and/or medications, counseling the patient and coordination of care as outlined above.   Rogerio Clay,  MD Department of Hematology/Oncology [facility] at [facility] Phone: [phone] Pager: [phone] Email: [email or web address]   06/08/2023 3:43 PM

## 2023-06-09 ENCOUNTER — Inpatient Hospital Stay: Payer: Medicare Other | Attending: Hematology and Oncology

## 2023-06-09 ENCOUNTER — Inpatient Hospital Stay: Payer: Medicare Other | Admitting: Hematology and Oncology

## 2023-06-09 ENCOUNTER — Inpatient Hospital Stay: Payer: Medicare Other

## 2023-06-09 VITALS — BP 123/77 | HR 72 | Temp 98.0°F | Resp 14 | Wt 186.7 lb

## 2023-06-09 DIAGNOSIS — C4441 Basal cell carcinoma of skin of scalp and neck: Secondary | ICD-10-CM | POA: Insufficient documentation

## 2023-06-09 DIAGNOSIS — Z95828 Presence of other vascular implants and grafts: Secondary | ICD-10-CM

## 2023-06-09 DIAGNOSIS — C7951 Secondary malignant neoplasm of bone: Secondary | ICD-10-CM

## 2023-06-09 DIAGNOSIS — C4492 Squamous cell carcinoma of skin, unspecified: Secondary | ICD-10-CM

## 2023-06-09 DIAGNOSIS — Z5112 Encounter for antineoplastic immunotherapy: Secondary | ICD-10-CM | POA: Insufficient documentation

## 2023-06-09 DIAGNOSIS — Z79620 Long term (current) use of immunosuppressive biologic: Secondary | ICD-10-CM | POA: Insufficient documentation

## 2023-06-09 LAB — CBC WITH DIFFERENTIAL (CANCER CENTER ONLY)
Abs Immature Granulocytes: 0.03 10*3/uL (ref 0.00–0.07)
Basophils Absolute: 0 10*3/uL (ref 0.0–0.1)
Basophils Relative: 0 %
Eosinophils Absolute: 0.2 10*3/uL (ref 0.0–0.5)
Eosinophils Relative: 2 %
HCT: 44.5 % (ref 39.0–52.0)
Hemoglobin: 15.6 g/dL (ref 13.0–17.0)
Immature Granulocytes: 0 %
Lymphocytes Relative: 15 %
Lymphs Abs: 1.2 10*3/uL (ref 0.7–4.0)
MCH: 31.4 pg (ref 26.0–34.0)
MCHC: 35.1 g/dL (ref 30.0–36.0)
MCV: 89.5 fL (ref 80.0–100.0)
Monocytes Absolute: 0.7 10*3/uL (ref 0.1–1.0)
Monocytes Relative: 9 %
Neutro Abs: 6.1 10*3/uL (ref 1.7–7.7)
Neutrophils Relative %: 74 %
Platelet Count: 252 10*3/uL (ref 150–400)
RBC: 4.97 MIL/uL (ref 4.22–5.81)
RDW: 13.4 % (ref 11.5–15.5)
WBC Count: 8.3 10*3/uL (ref 4.0–10.5)
nRBC: 0 % (ref 0.0–0.2)

## 2023-06-09 LAB — CMP (CANCER CENTER ONLY)
ALT: 19 U/L (ref 0–44)
AST: 19 U/L (ref 15–41)
Albumin: 4.2 g/dL (ref 3.5–5.0)
Alkaline Phosphatase: 75 U/L (ref 38–126)
Anion gap: 5 (ref 5–15)
BUN: 16 mg/dL (ref 8–23)
CO2: 26 mmol/L (ref 22–32)
Calcium: 9.8 mg/dL (ref 8.9–10.3)
Chloride: 110 mmol/L (ref 98–111)
Creatinine: 1.27 mg/dL — ABNORMAL HIGH (ref 0.61–1.24)
GFR, Estimated: 54 mL/min — ABNORMAL LOW (ref >60)
Glucose, Bld: 90 mg/dL (ref 70–99)
Potassium: 4.2 mmol/L (ref 3.5–5.1)
Sodium: 141 mmol/L (ref 135–145)
Total Bilirubin: 0.5 mg/dL (ref 0.0–1.2)
Total Protein: 7 g/dL (ref 6.5–8.1)

## 2023-06-09 LAB — TSH: TSH: 4.607 u[IU]/mL — ABNORMAL HIGH (ref 0.350–4.500)

## 2023-06-09 MED ORDER — SODIUM CHLORIDE 0.9 % IV SOLN
350.0000 mg | Freq: Once | INTRAVENOUS | Status: AC
  Administered 2023-06-09: 350 mg via INTRAVENOUS
  Filled 2023-06-09: qty 7

## 2023-06-09 MED ORDER — SODIUM CHLORIDE 0.9% FLUSH
10.0000 mL | INTRAVENOUS | Status: DC | PRN
  Administered 2023-06-09: 10 mL

## 2023-06-09 MED ORDER — SODIUM CHLORIDE 0.9 % IV SOLN: Freq: Once | INTRAVENOUS | Status: AC

## 2023-06-09 MED ORDER — HEPARIN SOD (PORK) LOCK FLUSH 100 UNIT/ML IV SOLN
500.0000 [IU] | Freq: Once | INTRAVENOUS | Status: AC | PRN
Start: 2023-06-09 — End: 2023-06-09
  Administered 2023-06-09: 500 [IU]

## 2023-06-09 NOTE — Patient Instructions (Signed)

## 2023-06-09 NOTE — Patient Instructions (Signed)
 [facility] - A DEPT OF [facility]  Discharge Instructions: Thank you for choosing [facility] to provide your oncology and hematology care.   If you have a lab appointment with the Cancer Center, please go directly to the Cancer Center and check in at the registration area.   Wear comfortable clothing and clothing appropriate for easy access to any Portacath or PICC line.   We strive to give you quality time with your provider. You may need to reschedule your appointment if you arrive late (15 or more minutes).  Arriving late affects you and other patients whose appointments are after yours.  Also, if you miss three or more appointments without notifying the office, you may be dismissed from the clinic at the provider's discretion.      For prescription refill requests, have your pharmacy contact our office and allow 72 hours for refills to be completed.    Today you received the following chemotherapy and/or immunotherapy agents libtayo      To help prevent nausea and vomiting after your treatment, we encourage you to take your nausea medication as directed.  BELOW ARE SYMPTOMS THAT SHOULD BE REPORTED IMMEDIATELY: *FEVER GREATER THAN 100.4 F (38 C) OR HIGHER *CHILLS OR SWEATING *NAUSEA AND VOMITING THAT IS NOT CONTROLLED WITH YOUR NAUSEA MEDICATION *UNUSUAL SHORTNESS OF BREATH *UNUSUAL BRUISING OR BLEEDING *URINARY PROBLEMS (pain or burning when urinating, or frequent urination) *BOWEL PROBLEMS (unusual diarrhea, constipation, pain near the anus) TENDERNESS IN MOUTH AND THROAT WITH OR WITHOUT PRESENCE OF ULCERS (sore throat, sores in mouth, or a toothache) UNUSUAL RASH, SWELLING OR PAIN  UNUSUAL VAGINAL DISCHARGE OR ITCHING   Items with * indicate a potential emergency and should be followed up as soon as possible or go to the Emergency Department if any problems should occur.  Please show the CHEMOTHERAPY ALERT CARD or IMMUNOTHERAPY  ALERT CARD at check-in to the Emergency Department and triage nurse.  Should you have questions after your visit or need to cancel or reschedule your appointment, please contact [facility] - A DEPT OF [facility]  Dept: [phone]  and follow the prompts.  Office hours are 8:00 a.m. to 4:30 p.m. Monday - Friday. Please note that voicemails left after 4:00 p.m. may not be returned until the following business day.  We are closed weekends and major holidays. You have access to a nurse at all times for urgent questions. Please call the main number to the clinic Dept: [phone] and follow the prompts.   For any non-urgent questions, you may also contact your provider using MyChart. We now offer e-Visits for anyone 104 and older to request care online for non-urgent symptoms. For details visit [email or web address].   Also download the MyChart app! Go to the app store, search "MyChart", open the app, select [facility], and log in with your MyChart username and password.

## 2023-06-10 ENCOUNTER — Other Ambulatory Visit: Payer: Self-pay

## 2023-06-10 ENCOUNTER — Encounter: Payer: Self-pay | Admitting: Hematology and Oncology

## 2023-06-10 LAB — T4: T4, Total: 9.3 ug/dL (ref 4.5–12.0)

## 2023-06-30 ENCOUNTER — Inpatient Hospital Stay: Attending: Hematology and Oncology

## 2023-06-30 ENCOUNTER — Inpatient Hospital Stay: Admitting: Physician Assistant

## 2023-06-30 ENCOUNTER — Other Ambulatory Visit: Payer: Self-pay

## 2023-06-30 ENCOUNTER — Inpatient Hospital Stay

## 2023-06-30 VITALS — BP 146/71 | HR 60 | Temp 97.7°F | Resp 13 | Wt 184.2 lb

## 2023-06-30 DIAGNOSIS — C4492 Squamous cell carcinoma of skin, unspecified: Secondary | ICD-10-CM

## 2023-06-30 DIAGNOSIS — C7951 Secondary malignant neoplasm of bone: Secondary | ICD-10-CM

## 2023-06-30 DIAGNOSIS — Z5112 Encounter for antineoplastic immunotherapy: Secondary | ICD-10-CM

## 2023-06-30 DIAGNOSIS — C4441 Basal cell carcinoma of skin of scalp and neck: Secondary | ICD-10-CM | POA: Insufficient documentation

## 2023-06-30 DIAGNOSIS — Z95828 Presence of other vascular implants and grafts: Secondary | ICD-10-CM

## 2023-06-30 DIAGNOSIS — C801 Malignant (primary) neoplasm, unspecified: Secondary | ICD-10-CM | POA: Insufficient documentation

## 2023-06-30 DIAGNOSIS — Z79620 Long term (current) use of immunosuppressive biologic: Secondary | ICD-10-CM | POA: Diagnosis not present

## 2023-06-30 LAB — CBC WITH DIFFERENTIAL (CANCER CENTER ONLY)
Abs Immature Granulocytes: 0.03 10*3/uL (ref 0.00–0.07)
Basophils Absolute: 0 10*3/uL (ref 0.0–0.1)
Basophils Relative: 1 %
Eosinophils Absolute: 0.2 10*3/uL (ref 0.0–0.5)
Eosinophils Relative: 3 %
HCT: 43.1 % (ref 39.0–52.0)
Hemoglobin: 15.2 g/dL (ref 13.0–17.0)
Immature Granulocytes: 1 %
Lymphocytes Relative: 17 %
Lymphs Abs: 1.1 10*3/uL (ref 0.7–4.0)
MCH: 31.7 pg (ref 26.0–34.0)
MCHC: 35.3 g/dL (ref 30.0–36.0)
MCV: 89.8 fL (ref 80.0–100.0)
Monocytes Absolute: 0.7 10*3/uL (ref 0.1–1.0)
Monocytes Relative: 10 %
Neutro Abs: 4.5 10*3/uL (ref 1.7–7.7)
Neutrophils Relative %: 68 %
Platelet Count: 229 10*3/uL (ref 150–400)
RBC: 4.8 MIL/uL (ref 4.22–5.81)
RDW: 13.2 % (ref 11.5–15.5)
WBC Count: 6.6 10*3/uL (ref 4.0–10.5)
nRBC: 0 % (ref 0.0–0.2)

## 2023-06-30 LAB — CMP (CANCER CENTER ONLY)
ALT: 23 U/L (ref 0–44)
AST: 20 U/L (ref 15–41)
Albumin: 4.2 g/dL (ref 3.5–5.0)
Alkaline Phosphatase: 68 U/L (ref 38–126)
Anion gap: 7 (ref 5–15)
BUN: 17 mg/dL (ref 8–23)
CO2: 27 mmol/L (ref 22–32)
Calcium: 9.8 mg/dL (ref 8.9–10.3)
Chloride: 106 mmol/L (ref 98–111)
Creatinine: 1.27 mg/dL — ABNORMAL HIGH (ref 0.61–1.24)
GFR, Estimated: 54 mL/min — ABNORMAL LOW (ref >60)
Glucose, Bld: 105 mg/dL — ABNORMAL HIGH (ref 70–99)
Potassium: 4 mmol/L (ref 3.5–5.1)
Sodium: 140 mmol/L (ref 135–145)
Total Bilirubin: 0.6 mg/dL (ref 0.0–1.2)
Total Protein: 7 g/dL (ref 6.5–8.1)

## 2023-06-30 LAB — TSH: TSH: 3.12 u[IU]/mL (ref 0.350–4.500)

## 2023-06-30 MED ORDER — SODIUM CHLORIDE 0.9 % IV SOLN
350.0000 mg | Freq: Once | INTRAVENOUS | Status: AC
  Administered 2023-06-30: 350 mg via INTRAVENOUS
  Filled 2023-06-30: qty 7

## 2023-06-30 MED ORDER — HEPARIN SOD (PORK) LOCK FLUSH 100 UNIT/ML IV SOLN
500.0000 [IU] | Freq: Once | INTRAVENOUS | Status: AC | PRN
  Administered 2023-06-30: 500 [IU]

## 2023-06-30 MED ORDER — SODIUM CHLORIDE 0.9 % IV SOLN: Freq: Once | INTRAVENOUS | Status: AC

## 2023-06-30 MED ORDER — NYSTATIN 100000 UNIT/ML MT SUSP: 5.0000 mL | Freq: Three times a day (TID) | OROMUCOSAL | 0 refills | Status: DC | PRN

## 2023-06-30 MED ORDER — SODIUM CHLORIDE 0.9% FLUSH
10.0000 mL | INTRAVENOUS | Status: DC | PRN
  Administered 2023-06-30: 10 mL

## 2023-06-30 NOTE — Progress Notes (Signed)
 [facility] [phone]   [phone]  PROGRESS NOTE  Patient Care Team: Martin Broad, MD as PCP - General (Internal Medicine) Martin Luster, MD as Attending Physician (Urology) Martin Dawes, MD as Consulting Physician (Radiation Oncology) Curry, Martin Aye, RN as Oncology Nurse Navigator (Oncology) Martin Keeling, MD as Consulting Physician (Dermatology) Martin Drum, MD as Consulting Physician (Gastroenterology) Martin Bame, MD as Consulting Physician (Hematology and Oncology)  Hematological/Oncological History # Bone Lesions on CT/MRI # 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 Additionally there was noted to be a new enhancing lesion at the tip of the right greater trochanter measuring up to 1.7 cm also suspicious for metastatic disease. 09/27/2020: establish care with Dr. Rosaline Curry 10/29/2020: CT biopsy performed of left iliac bone lesion. Pathology showed bone and marrow without evidence of carcinoma.  11/11/2022: Cycle 1 Day 1 of cemplimab therapy.  12/03/2022: Cycle 2 Day 1 of cemplimab therapy.  12/23/2022: Cycle 3 Day 1 of cemplimab therapy.  01/13/2023: Cycle 4 Day 1 of cemplimab therapy.  02/03/2023: Cycle 5 Day 1 of cemplimab therapy.  02/23/2023: Cycle 6 Day 1 of cemplimab therapy.  03/16/2023:  Cycle 7 Day 1 of cemplimab therapy.  04/06/2023: Cycle 8 Day 1 of cemplimab therapy.  04/28/2023: Cycle 9 Day 1 of cemplimab therapy.  05/18/2023: Cycle 10 Day 1 of cemplimab therapy.  06/08/2023: Cycle 11 Day 1 of cemplimab therapy.  06/30/2023: Cycle 12 Day 1 of cemplimab therapy.   Interval History:  Martin Curry 89  y.o. 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 The patient's last visit was on 06/09/2023. In the interim since the last visit he has completed Cycle 11 of cemiplimab .  On exam today Martin Curry reports he is recovering well since his fall and he has very minimal discomfort that is lingering.  He reports his energy levels are returning back to his baseline.  He is able to complete his daily activities on his own.  He denies any appetite changes or weight loss.  He denies nausea, vomiting or abdominal pain.  He has occasional episodes of diarrhea that resolve on its own.  He denies easy bruising or signs of active bleeding.  Overall he feels well and is willing and able to proceed with immunotherapy at this time he denies any fevers, chills, sweats, shortness of breath, chest pain or cough.   A full 10 point ROS is listed below.   MEDICAL HISTORY:  Past Medical History:  Diagnosis Date   Atypical nevus 09/07/2002   left abdomen-slight   BCC (basal cell carcinoma of skin) 07/19/2014   left sideburn- +margin-exc.   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 if orignated from skin cancer   Nodular basal cell carcinoma (BCC) 11/21/2012   right side of scalp-CX35FU   Nodular basal cell carcinoma (BCC) 06/12/2014   left sideburn-CX35FU/exc.   Nodular basal cell carcinoma (BCC) 07/16/2015   left scalp-CX35FU    Polycythemia    Prostate cancer (HCC) 1990's   total prostatectomy and adjuvant radiation. Last PSA was 0.02 in 09/2011   SCC (squamous cell carcinoma) 04/03/2009   forehead-txpbx   SCC (squamous cell carcinoma) 07/16/2015   left front scalp   SCC (squamous cell carcinoma) 07/16/2015   left front scalp-Cx35FU   SCCA (squamous cell carcinoma) of skin 12/20/2019   Right forehead (in situ)   SCCA (squamous cell carcinoma) of skin 05/27/2020   Mid Parietal Scalp (in situ)   Superficial basal cell carcinoma (BCC) 11/21/2012   behind left ear-CX35FU, midback-CX35FU, right forehead-CX35FU-exc   Superficial basal cell carcinoma (BCC) 11/02/2017   left post neck-CX35FU   Superficial nodular basal cell carcinoma (BCC) 12/20/2019   Left temporal scalp   Superficial nodular basal cell carcinoma (BCC) 05/27/2020   Right Preauricular area (curet and 5FU)    SURGICAL HISTORY: Past Surgical History:  Procedure Laterality Date   ADJACENT TISSUE TRANSFER/TISSUE REARRANGEMENT N/A 05/22/2019   Procedure: COMPLEX CLOSURE OF NECK WOUND;  Surgeon: Martin Bonito, MD;  Location: [facility] OR;  Service: Plastics;  Laterality: N/A;   ALLOGRAFT APPLICATION N/A 05/22/2019   Procedure: POSSIBLE FACIAL NERVE RECONSTRUCTION WITH NERVE ALLOGRAFT VS AUTOGRAFT;  Surgeon: Martin Bonito, MD;  Location: [facility] OR;  Service: Plastics;  Laterality: N/A;   BLADDER SURGERY     due to prostatectomy.    CATARACT EXTRACTION     right   COLONOSCOPY     neg in the past; due in 2014 with [place] Dr. Adan Curry   EYE SURGERY     1950   HERNIA REPAIR     IR IMAGING GUIDED PORT INSERTION  11/30/2022   MOHS SURGERY     NECK DISSECTION  05/22/2019    NECK DISSECTION   PAROTIDECTOMY  05/22/2019   PAROTIDECTOMY with facial nerve dissection    PAROTIDECTOMY Right 05/22/2019   Procedure: PAROTIDECTOMY;  Surgeon: Martin Mellow, MD;  Location: [facility] OR;  Service: ENT;  Laterality: Right;   PROSTATE SURGERY     RADICAL NECK DISSECTION  Right 05/22/2019   Procedure: RADICAL NECK DISSECTION;  Surgeon: Martin Mellow, MD;  Location: [facility] OR;  Service: ENT;  Laterality: Right;   TONSILLECTOMY      SOCIAL HISTORY: Social History   Socioeconomic History   Marital status: Married    Spouse name: Not on file   Number of children: 3   Years of education: Not on file   Highest education level: Not on file  Occupational History    Comment: retired Scientist, [employer]; 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   Occupation: retired  Tobacco Use   Smoking status: Never   Smokeless tobacco: Never  Vaping Use   Vaping status: Never Used  Substance and Sexual Activity   Alcohol  use: No   Drug use: No   Sexual activity: Not on file  Other Topics Concern   Not on file  Social History Narrative   Not on file   Social Drivers of Health   Financial Resource Strain: Not on file  Food Insecurity: No  Food Insecurity (10/29/2022)   Hunger Vital Sign    Worried About Running Out of Food in the Last Year: Never true    Ran Out of Food in the Last Year: Never true  Transportation Needs: No Transportation Needs (10/29/2022)   PRAPARE - Administrator, Civil Service (Medical): No    Lack of Transportation (Non-Medical): No  Physical Activity: Not on file  Stress: Not on file  Social Connections: Not on file  Intimate Partner Violence: Not At Risk (10/29/2022)   Humiliation, Afraid, Rape, and Kick questionnaire    Fear of Current or Ex-Partner: No    Emotionally Abused: No    Physically Abused: No    Sexually Abused: No    FAMILY HISTORY: Family History  Problem Relation Age of Onset   Uterine cancer Mother 55   Ovarian cancer Mother    Lung cancer Father 29   Prostate cancer Brother    Prostate cancer Brother    Colon cancer Neg Hx    Stomach cancer Neg Hx    Esophageal cancer Neg Hx    Rectal cancer Neg Hx     ALLERGIES:  is allergic to methylprednisolone, penicillins, latex, and tape.  MEDICATIONS:  Current Outpatient Medications   Medication Sig Dispense Refill   amLODipine  (NORVASC ) 5 MG tablet Take 5 mg by mouth daily.      aspirin  81 MG tablet Take 81 mg by mouth daily.     benazepril  (LOTENSIN ) 10 MG tablet Take 10 mg by mouth daily.     famotidine -calcium carbonate-magnesium hydroxide (PEPCID  COMPLETE) 10-800-165 MG chewable tablet Take one tablet PRN     levothyroxine  (SYNTHROID ) 25 MCG tablet Take 25 mcg by mouth daily before breakfast. 2 tABS M,W,F and one the other days     lidocaine  (LIDODERM ) 5 % Place 1 patch onto the skin daily. Remove & Discard patch within 12 hours or as directed by MD 30 patch 0   lidocaine -prilocaine  (EMLA ) cream Apply 1 Application topically as needed. 30 g 0   Multiple Vitamin (MULTIVITAMIN) tablet Take 1 tablet by mouth daily.     nystatin (MYCOSTATIN) 100000 UNIT/ML suspension Take 5 mLs (500,000 Units total) by mouth 3 (three) times daily as needed. 60 mL 0   potassium citrate  (UROCIT-K ) 10 MEQ (1080 MG) SR tablet Take 10 mEq by mouth 3 (three) times daily with meals.     simvastatin  (ZOCOR ) 20 MG tablet Take 20 mg by mouth every evening.     ondansetron  (ZOFRAN ) 8 MG tablet Take 1 tablet (8 mg total) by mouth every 8 (eight) hours as needed for nausea or vomiting. (Patient not taking: Reported on 06/30/2023) 20 tablet 1   No current facility-administered medications for this visit.   Facility-Administered Medications Ordered in Other Visits  Medication Dose Route Frequency Provider Last Rate Last Admin   cemiplimab -rwlc (LIBTAYO ) 350 mg in sodium chloride  0.9 % 100 mL chemo infusion  350 mg Intravenous Once Dorsey, John T IV, MD 214 mL/hr at 06/30/23 1122 350 mg at 06/30/23 1122   heparin  lock flush 100 unit/mL  500 Units Intracatheter Once PRN Dorsey, John T IV, MD       sodium chloride  flush (NS) 0.9 % injection 10 mL  10 mL Intracatheter PRN Dorsey, John T IV, MD        REVIEW OF SYSTEMS:   Constitutional: ( - ) fevers, ( - )  chills , ( - ) night sweats Eyes: ( - )  blurriness  of vision, ( - ) double vision, ( - ) watery eyes Ears, nose, mouth, throat, and face: ( - ) mucositis, ( - ) sore throat Respiratory: ( - ) cough, ( - ) dyspnea, ( - ) wheezes Cardiovascular: ( - ) palpitation, ( - ) chest discomfort, ( - ) lower extremity swelling Gastrointestinal:  ( - ) nausea, ( - ) heartburn, ( - ) change in bowel habits Skin: ( - ) abnormal skin rashes Lymphatics: ( - ) new lymphadenopathy, ( - ) easy bruising Neurological: ( - ) numbness, ( - ) tingling, ( - ) new weaknesses Behavioral/Psych: ( - ) mood change, ( - ) new changes  All other systems were reviewed with the patient and are negative.  PHYSICAL EXAMINATION: ECOG PERFORMANCE STATUS: 1 - Symptomatic but completely ambulatory  Vitals:   06/30/23 1027  BP: (!) 146/71  Pulse: 60  Resp: 13  Temp: 97.7 F (36.5 C)  SpO2: 97%     Filed Weights   06/30/23 1027  Weight: 184 lb 3.2 oz (83.6 kg)      GENERAL: Well-appearing elderly Caucasian male, alert, no distress and comfortable SKIN: skin color, texture, turgor are normal, no rashes or significant lesions EYES: conjunctiva are pink and non-injected, sclera clear MOUTH: Thrush noted on tongue. LUNGS: clear to auscultation and percussion with normal breathing effort HEART: regular rate & rhythm and no murmurs and no lower extremity edema Musculoskeletal: no cyanosis of digits and no clubbing  PSYCH: alert & oriented x 3, fluent speech NEURO: no focal motor/sensory deficits  LABORATORY DATA:  I have reviewed the data as listed    Latest Ref Rng & Units 06/30/2023    9:43 AM 06/09/2023   11:21 AM 06/04/2023    4:32 AM  CBC  WBC 4.0 - 10.5 K/uL 6.6  8.3  14.1   Hemoglobin 13.0 - 17.0 g/dL 16.1  09.6  04.5   Hematocrit 39.0 - 52.0 % 43.1  44.5  46.3   Platelets 150 - 400 K/uL 229  252  238        Latest Ref Rng & Units 06/30/2023    9:43 AM 06/09/2023   11:21 AM 06/04/2023    4:32 AM  CMP  Glucose 70 - 99 mg/dL 409  90  811   BUN  8 - 23 mg/dL 17  16  28    Creatinine 0.61 - 1.24 mg/dL 9.14  7.82  9.56   Sodium 135 - 145 mmol/L 140  141  137   Potassium 3.5 - 5.1 mmol/L 4.0  4.2  4.2   Chloride 98 - 111 mmol/L 106  110  107   CO2 22 - 32 mmol/L 27  26  22    Calcium 8.9 - 10.3 mg/dL 9.8  9.8  9.3   Total Protein 6.5 - 8.1 g/dL 7.0  7.0    Total Bilirubin 0.0 - 1.2 mg/dL 0.6  0.5    Alkaline Phos 38 - 126 U/L 68  75    AST 15 - 41 U/L 20  19    ALT 0 - 44 U/L 23  19      Lab Results  Component Value Date   MPROTEIN Not Observed 09/27/2020   Lab Results  Component Value Date   KPAFRELGTCHN 25.6 (H) 09/27/2020   LAMBDASER 23.0 09/27/2020   KAPLAMBRATIO 1.11 09/27/2020    RADIOGRAPHIC STUDIES: CT ANGIO HEAD NECK W WO CM Result Date: 06/04/2023 CLINICAL DATA:  Possible syncopal episode causing  fall. History of C1 mass EXAM: CT ANGIOGRAPHY HEAD AND NECK WITH AND WITHOUT CONTRAST TECHNIQUE: Multidetector CT imaging of the head and neck was performed using the standard protocol during bolus administration of intravenous contrast. Multiplanar CT image reconstructions and MIPs were obtained to evaluate the vascular anatomy. Carotid stenosis measurements (when applicable) are obtained utilizing NASCET criteria, using the distal internal carotid diameter as the denominator. RADIATION DOSE REDUCTION: This exam was performed according to the departmental dose-optimization program which includes automated exposure control, adjustment of the mA and/or kV according to patient size and/or use of iterative reconstruction technique. CONTRAST:  75mL OMNIPAQUE  IOHEXOL  350 MG/ML SOLN COMPARISON:  No preceding CTA comparison FINDINGS: CTA NECK FINDINGS Aortic arch: Atheromatous plaque.  No acute finding Right carotid system: Low-density atheromatous wall thickening diffusely with calcified plaque at the bifurcation. No stenosis or ulceration. Left carotid system: Moderate calcified plaque at the bifurcation without stenosis or ulceration.  Vertebral arteries: Proximal subclavian atherosclerosis without significant stenosis. Both vertebral arteries are smoothly contoured and diffusely patent, including on the right despite a skull base and upper cervical mass with bony destruction. Skeleton: No new mass centered at the right occipito temporal skull base also eroding the C1 right lateral mass and infiltrating the adjacent soft tissues. Reference recent PET. Other neck: As above. Known right thyroid  nodule, reference prior PET. No acute finding Upper chest: No acute finding Review of the MIP images confirms the above findings CTA HEAD FINDINGS Anterior circulation: No significant stenosis, proximal occlusion, aneurysm, or vascular malformation. Atheromatous calcification of the cavernous carotids. Posterior circulation: Calcified plaque involving the left more than right vertebral artery. Fetal type left PCA. No branch occlusion, beading, or aneurysm Venous sinuses: Patent as permitted by contrast timing. Anatomic variants: None significant Review of the MIP images confirms the above findings IMPRESSION: No emergent vascular finding. The skull base mass does not compromise the right vertebral artery. Ordinary atheromatous change without significant stenosis of major arteries in the head and neck. Electronically Signed   By: Ronnette Coke [name]   On: 06/04/2023 06:43   DG Pelvis 1-2 Views Result Date: 06/04/2023 CLINICAL DATA:  Right-sided pelvic pain after fall EXAM: PELVIS - 1 VIEW COMPARISON:  None Available. FINDINGS: There is no evidence of pelvic fracture or diastasis. No pelvic bone lesions are seen. Generalized osteopenia. Putative pelvic lymphadenectomy clips. IMPRESSION: No evidence of injury Electronically Signed   By: Ronnette Coke [name]   On: 06/04/2023 05:58   CT Head Wo Contrast Result Date: 06/04/2023 CLINICAL DATA:  Possible syncopal episode causing patient to fall hitting his head. Complains of back pain. EXAM: CT HEAD WITHOUT  CONTRAST CT CERVICAL SPINE WITHOUT CONTRAST TECHNIQUE: Multidetector CT imaging of the head and cervical spine was performed following the standard protocol without intravenous contrast. Multiplanar CT image reconstructions of the cervical spine were also generated. RADIATION DOSE REDUCTION: This exam was performed according to the departmental dose-optimization program which includes automated exposure control, adjustment of the mA and/or kV according to patient size and/or use of iterative reconstruction technique. COMPARISON:  PET-CT from 05/03/2023. FINDINGS: CT HEAD FINDINGS Brain: No evidence of acute infarction, hemorrhage, hydrocephalus, extra-axial collection or mass lesion/mass effect. Mild patchy low attenuation within the subcortical white matter compatible with changes secondary to chronic small vessel ischemic disease. Vascular: No hyperdense vessel or unexpected calcification. Skull: Nails scratch set no acute skull fracture identified. As noted on the PET-CT there is a large destructive mass involving the right upper neck. Tumor involves the  inferior portion of the right occipital bone. There is associated right mastoid air cell effusion. Sinuses/Orbits: The paranasal sinuses and left mastoid air cells are clear. Other: None CT CERVICAL SPINE FINDINGS Alignment: Alignment of the cervical spine appears normal. Skull base and vertebrae: Destructive mass involving the right occipital bone, right mastoid bone and lateral mass of C1. This measures approximately 4.8 x 3.2 by 4.2 cm. This is similar to the PET-CT from 05/03/2023.The tumor erodes into the right transverse foramen of the C1 vertebra. Patency of the vertebral artery at this level cannot be assessed reflecting lack of IV contrast material. Soft tissues and spinal canal: No prevertebral fluid or swelling. No visible canal hematoma. Disc levels: Mild multilevel disc space narrowing with endplate spurring. Upper chest: Bilateral pleuroparenchymal  scarring noted within the imaged portions of the upper lung zones. Other: 2.3 x 2.2 cm nodule within right lobe of thyroid  gland is again noted. This has remained unchanged since 02/02/2020. IMPRESSION: 1. No acute intracranial abnormalities. 2. Mild chronic small vessel ischemic disease. 3. No acute cervical spine fracture. 4. Right-side of neck mass involving the right occipital bone, right mastoid bone and lateral mass of C1. This is similar to the PET-CT from 05/03/2023. The tumor erodes into the right transverse foramen of the C1 vertebra. Patency of the right vertebral artery at this level cannot be assessed reflecting lack of IV contrast material. 5. No evidence for cervical spine fracture or subluxation. Electronically Signed   By: Kimberley Penman [name]   On: 06/04/2023 05:38   CT Cervical Spine Wo Contrast Result Date: 06/04/2023 CLINICAL DATA:  Possible syncopal episode causing patient to fall hitting his head. Complains of back pain. EXAM: CT HEAD WITHOUT CONTRAST CT CERVICAL SPINE WITHOUT CONTRAST TECHNIQUE: Multidetector CT imaging of the head and cervical spine was performed following the standard protocol without intravenous contrast. Multiplanar CT image reconstructions of the cervical spine were also generated. RADIATION DOSE REDUCTION: This exam was performed according to the departmental dose-optimization program which includes automated exposure control, adjustment of the mA and/or kV according to patient size and/or use of iterative reconstruction technique. COMPARISON:  PET-CT from 05/03/2023. FINDINGS: CT HEAD FINDINGS Brain: No evidence of acute infarction, hemorrhage, hydrocephalus, extra-axial collection or mass lesion/mass effect. Mild patchy low attenuation within the subcortical white matter compatible with changes secondary to chronic small vessel ischemic disease. Vascular: No hyperdense vessel or unexpected calcification. Skull: Nails scratch set no acute skull fracture identified.  As noted on the PET-CT there is a large destructive mass involving the right upper neck. Tumor involves the inferior portion of the right occipital bone. There is associated right mastoid air cell effusion. Sinuses/Orbits: The paranasal sinuses and left mastoid air cells are clear. Other: None CT CERVICAL SPINE FINDINGS Alignment: Alignment of the cervical spine appears normal. Skull base and vertebrae: Destructive mass involving the right occipital bone, right mastoid bone and lateral mass of C1. This measures approximately 4.8 x 3.2 by 4.2 cm. This is similar to the PET-CT from 05/03/2023.The tumor erodes into the right transverse foramen of the C1 vertebra. Patency of the vertebral artery at this level cannot be assessed reflecting lack of IV contrast material. Soft tissues and spinal canal: No prevertebral fluid or swelling. No visible canal hematoma. Disc levels: Mild multilevel disc space narrowing with endplate spurring. Upper chest: Bilateral pleuroparenchymal scarring noted within the imaged portions of the upper lung zones. Other: 2.3 x 2.2 cm nodule within right lobe of thyroid  gland is again noted.  This has remained unchanged since 02/02/2020. IMPRESSION: 1. No acute intracranial abnormalities. 2. Mild chronic small vessel ischemic disease. 3. No acute cervical spine fracture. 4. Right-side of neck mass involving the right occipital bone, right mastoid bone and lateral mass of C1. This is similar to the PET-CT from 05/03/2023. The tumor erodes into the right transverse foramen of the C1 vertebra. Patency of the right vertebral artery at this level cannot be assessed reflecting lack of IV contrast material. 5. No evidence for cervical spine fracture or subluxation. Electronically Signed   By: Kimberley Penman [name]   On: 06/04/2023 05:38   CT Thoracic Spine Wo Contrast Result Date: 06/04/2023 CLINICAL DATA:  Back pain after fall. History of prostate and bone cancer. EXAM: CT THORACIC AND LUMBAR SPINE  WITHOUT CONTRAST TECHNIQUE: Multidetector CT imaging of the thoracic and lumbar spine was performed without contrast. Multiplanar CT image reconstructions were also generated. RADIATION DOSE REDUCTION: This exam was performed according to the departmental dose-optimization program which includes automated exposure control, adjustment of the mA and/or kV according to patient size and/or use of iterative reconstruction technique. COMPARISON:  Head CT 05/03/2023 FINDINGS: CT THORACIC SPINE FINDINGS Alignment: No traumatic malalignment. Vertebrae: No acute fracture. Multilevel thoracic ankylosis from bridging osteophyte. Subjective generalized osteopenia. Sclerotic lesion in the posterior left fifth rib. Paraspinal and other soft tissues: Right thyroid  nodule known from PET CT comparison. No acute finding Disc levels: Diffuse thoracic ankylosis from bridging osteophytes. CT LUMBAR SPINE FINDINGS Segmentation: 5 lumbar type vertebrae. Alignment: Normal. Vertebrae: Sclerotic metastases in the L1, L3, L5, and S2/S3 levels. No evidence of extraosseous tumor. L1 and L2 right transverse process fractures. Nondisplaced right twelfth rib fracture. Paraspinal and other soft tissues: No perispinal hematoma. Disc levels: Mild for age degenerative change without bony impingement seen. IMPRESSION: Acute right transverse process fractures at L1 and L2. Nondisplaced right twelfth rib fracture. Osseous metastatic disease, treated based on recent PET-CT. Electronically Signed   By: Ronnette Coke [name]   On: 06/04/2023 05:36   CT Lumbar Spine Wo Contrast Result Date: 06/04/2023 CLINICAL DATA:  Back pain after fall. History of prostate and bone cancer. EXAM: CT THORACIC AND LUMBAR SPINE WITHOUT CONTRAST TECHNIQUE: Multidetector CT imaging of the thoracic and lumbar spine was performed without contrast. Multiplanar CT image reconstructions were also generated. RADIATION DOSE REDUCTION: This exam was performed according to the  departmental dose-optimization program which includes automated exposure control, adjustment of the mA and/or kV according to patient size and/or use of iterative reconstruction technique. COMPARISON:  Head CT 05/03/2023 FINDINGS: CT THORACIC SPINE FINDINGS Alignment: No traumatic malalignment. Vertebrae: No acute fracture. Multilevel thoracic ankylosis from bridging osteophyte. Subjective generalized osteopenia. Sclerotic lesion in the posterior left fifth rib. Paraspinal and other soft tissues: Right thyroid  nodule known from PET CT comparison. No acute finding Disc levels: Diffuse thoracic ankylosis from bridging osteophytes. CT LUMBAR SPINE FINDINGS Segmentation: 5 lumbar type vertebrae. Alignment: Normal. Vertebrae: Sclerotic metastases in the L1, L3, L5, and S2/S3 levels. No evidence of extraosseous tumor. L1 and L2 right transverse process fractures. Nondisplaced right twelfth rib fracture. Paraspinal and other soft tissues: No perispinal hematoma. Disc levels: Mild for age degenerative change without bony impingement seen. IMPRESSION: Acute right transverse process fractures at L1 and L2. Nondisplaced right twelfth rib fracture. Osseous metastatic disease, treated based on recent PET-CT. Electronically Signed   By: Ronnette Coke [name]   On: 06/04/2023 05:36   DG Chest Portable 1 View Result Date: 06/04/2023 CLINICAL DATA:  Syncope  EXAM: PORTABLE CHEST 1 VIEW COMPARISON:  03/20/2005.  PET CT 05/03/2023 FINDINGS: Cardiomegaly. Probable vascular pedicle widening. Porta catheter on the right with tip at the upper right atrium. Congested appearance of vessels bilaterally. No visible effusion or pneumothorax. IMPRESSION: Cardiomegaly and vascular congestion. Electronically Signed   By: Ronnette Coke [name]   On: 06/04/2023 04:47     ASSESSMENT & PLAN JES JAMAICA is a 89 y.o. 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  At this time the patient's clinical findings are complicated.  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  It is unclear if these 2 processes are the same or 2 separate primaries.  Additionally there is no clear evidence of a primary squamous cell carcinoma (however my concern is that the area behind the right ear may represent the same process).  Patient also does have PET avid areas of his bowels which I do not suspect are related, but we will have evaluated further with EGD/colonoscopy as needed.  At this time the best treatment course would be to treat with Cemiplimab  which should be effective at treating both basal cell carcinoma and squamous cell carcinoma, effectively treating both even if they are separate processes.  Overall he voices understanding of this complicated situation and our plan moving forward.  Of note radiation oncology and ENT are on board and willing to assist with management of his current findings.  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 Previously discussed with radiology who notes a repeat biopsy was not recommended and repeat imaging would be preferred.  --negative multiple myeloma labs with SPEP and serum free light chains showing no evidence of monoclonal gammopathy.  --Given his prior history of prostate cancer we ordered a PSA, which was normal. Repeated PSA previously, found to be within normal limits --CT C/A/P shows no evidence of disease elsewhere in the body in August 2022. Most recent PET CT scan on 10/02/2022 showed hypermetabolic osseous metastasis with a dominant mass centered about the inferior medial portion of the right mastoid, suspicious for site of primary squamous cell carcinoma. -- PET CT scan shows evidence of  lesion behind the right ear as well as the known pelvic metastasis. -- Reviewed tissue with pathology to determine if both lesions are the same pathology or different origins.  At this time findings are most consistent with a basal cell primary behind his ear and squamous cell carcinoma of the pelvis.  It is possible these at the same pathology with the basal/squamous crossover, though they may represent separate primaries.  Either way Cemiplimab  therapy should be adequate to treat both. -- PD-L1 testing showed 0% PD-L1 score, however the use of cemiplimab  does not require PD-L1 positivity (lack of PD-L1 prevents the use of pembrolizumab)  PLAN: --Proceed with cycle 12 day 1 of Cemiplimab  immunotherapy today --Labs today show white blood cell count 6.6, hemoglobin 15.2, MCV 89.8, platelets 229.  Creatinine stable at 1.27, LFTs normal.  Thyroid  panel pending. --Most recent PET CT scan on 05/03/2023 showed no new or progressive disease with overall response in most areas.  -Proceed with treatment today without any dose modifications.- -- Return to clinic for next cycle of cemiplimab   in 3 weeks time.   # Bleeding of the Ear--healed -- secondary due to erosion of the tumor into his ear canal. -- wound has healed without evidence of bleeding.  --Minor orange color discharge from the ear, no evidence of bleeding. -- monitor for now.   # FDG Avid Colon Lesion --Patient underwent colonoscopy with gastroenterology and found to have large polyps. -- No evidence of colonic malignancy.  #Oral candidiasis: --Sent nystatin suspension.  No orders of the defined types were placed in this encounter.   All questions were answered. The patient knows to call the clinic with any problems, questions or concerns.  A total of more than 30 minutes were spent on this encounter with face-to-face time and non-face-to-face time, including preparing to see the patient, ordering tests and/or medications, counseling the  patient and coordination of care as outlined above.   Wyline Hearing PA-C Dept of Hematology and Oncology [facility] at [facility] Phone: [phone]    06/30/2023 11:42 AM

## 2023-06-30 NOTE — Patient Instructions (Signed)
 [facility] - A DEPT OF [facility]  Discharge Instructions: Thank you for choosing [facility] to provide your oncology and hematology care.   If you have a lab appointment with the Cancer Center, please go directly to the Cancer Center and check in at the registration area.   Wear comfortable clothing and clothing appropriate for easy access to any Portacath or PICC line.   We strive to give you quality time with your provider. You may need to reschedule your appointment if you arrive late (15 or more minutes).  Arriving late affects you and other patients whose appointments are after yours.  Also, if you miss three or more appointments without notifying the office, you may be dismissed from the clinic at the provider's discretion.      For prescription refill requests, have your pharmacy contact our office and allow 72 hours for refills to be completed.    Today you received the following chemotherapy and/or immunotherapy agents libtayo      To help prevent nausea and vomiting after your treatment, we encourage you to take your nausea medication as directed.  BELOW ARE SYMPTOMS THAT SHOULD BE REPORTED IMMEDIATELY: *FEVER GREATER THAN 100.4 F (38 C) OR HIGHER *CHILLS OR SWEATING *NAUSEA AND VOMITING THAT IS NOT CONTROLLED WITH YOUR NAUSEA MEDICATION *UNUSUAL SHORTNESS OF BREATH *UNUSUAL BRUISING OR BLEEDING *URINARY PROBLEMS (pain or burning when urinating, or frequent urination) *BOWEL PROBLEMS (unusual diarrhea, constipation, pain near the anus) TENDERNESS IN MOUTH AND THROAT WITH OR WITHOUT PRESENCE OF ULCERS (sore throat, sores in mouth, or a toothache) UNUSUAL RASH, SWELLING OR PAIN  UNUSUAL VAGINAL DISCHARGE OR ITCHING   Items with * indicate a potential emergency and should be followed up as soon as possible or go to the Emergency Department if any problems should occur.  Please show the CHEMOTHERAPY ALERT CARD or IMMUNOTHERAPY  ALERT CARD at check-in to the Emergency Department and triage nurse.  Should you have questions after your visit or need to cancel or reschedule your appointment, please contact [facility] - A DEPT OF [facility]  Dept: [phone]  and follow the prompts.  Office hours are 8:00 a.m. to 4:30 p.m. Monday - Friday. Please note that voicemails left after 4:00 p.m. may not be returned until the following business day.  We are closed weekends and major holidays. You have access to a nurse at all times for urgent questions. Please call the main number to the clinic Dept: [phone] and follow the prompts.   For any non-urgent questions, you may also contact your provider using MyChart. We now offer e-Visits for anyone 104 and older to request care online for non-urgent symptoms. For details visit [email or web address].   Also download the MyChart app! Go to the app store, search "MyChart", open the app, select [facility], and log in with your MyChart username and password.

## 2023-07-01 ENCOUNTER — Encounter: Payer: Self-pay | Admitting: Physician Assistant

## 2023-07-01 LAB — T4: T4, Total: 8.7 ug/dL (ref 4.5–12.0)

## 2023-07-21 ENCOUNTER — Inpatient Hospital Stay

## 2023-07-21 ENCOUNTER — Inpatient Hospital Stay: Admitting: Hematology and Oncology

## 2023-07-21 VITALS — BP 124/68 | HR 65 | Resp 14

## 2023-07-21 VITALS — BP 122/73 | HR 64 | Temp 97.3°F | Resp 13 | Wt 184.3 lb

## 2023-07-21 DIAGNOSIS — C801 Malignant (primary) neoplasm, unspecified: Secondary | ICD-10-CM | POA: Diagnosis not present

## 2023-07-21 DIAGNOSIS — C4441 Basal cell carcinoma of skin of scalp and neck: Secondary | ICD-10-CM

## 2023-07-21 DIAGNOSIS — Z95828 Presence of other vascular implants and grafts: Secondary | ICD-10-CM

## 2023-07-21 DIAGNOSIS — C7951 Secondary malignant neoplasm of bone: Secondary | ICD-10-CM

## 2023-07-21 DIAGNOSIS — C4492 Squamous cell carcinoma of skin, unspecified: Secondary | ICD-10-CM | POA: Diagnosis not present

## 2023-07-21 DIAGNOSIS — Z5112 Encounter for antineoplastic immunotherapy: Secondary | ICD-10-CM | POA: Diagnosis not present

## 2023-07-21 DIAGNOSIS — Z79620 Long term (current) use of immunosuppressive biologic: Secondary | ICD-10-CM | POA: Diagnosis not present

## 2023-07-21 LAB — CMP (CANCER CENTER ONLY)
ALT: 26 U/L (ref 0–44)
AST: 25 U/L (ref 15–41)
Albumin: 3.9 g/dL (ref 3.5–5.0)
Alkaline Phosphatase: 73 U/L (ref 38–126)
Anion gap: 7 (ref 5–15)
BUN: 22 mg/dL (ref 8–23)
CO2: 24 mmol/L (ref 22–32)
Calcium: 9.8 mg/dL (ref 8.9–10.3)
Chloride: 107 mmol/L (ref 98–111)
Creatinine: 1.34 mg/dL — ABNORMAL HIGH (ref 0.61–1.24)
GFR, Estimated: 51 mL/min — ABNORMAL LOW (ref >60)
Glucose, Bld: 104 mg/dL — ABNORMAL HIGH (ref 70–99)
Potassium: 4.1 mmol/L (ref 3.5–5.1)
Sodium: 138 mmol/L (ref 135–145)
Total Bilirubin: 0.8 mg/dL (ref 0.0–1.2)
Total Protein: 6.9 g/dL (ref 6.5–8.1)

## 2023-07-21 LAB — CBC WITH DIFFERENTIAL (CANCER CENTER ONLY)
Abs Immature Granulocytes: 0.04 10*3/uL (ref 0.00–0.07)
Basophils Absolute: 0 10*3/uL (ref 0.0–0.1)
Basophils Relative: 1 %
Eosinophils Absolute: 0.2 10*3/uL (ref 0.0–0.5)
Eosinophils Relative: 3 %
HCT: 43 % (ref 39.0–52.0)
Hemoglobin: 15.1 g/dL (ref 13.0–17.0)
Immature Granulocytes: 1 %
Lymphocytes Relative: 17 %
Lymphs Abs: 1.2 10*3/uL (ref 0.7–4.0)
MCH: 31.4 pg (ref 26.0–34.0)
MCHC: 35.1 g/dL (ref 30.0–36.0)
MCV: 89.4 fL (ref 80.0–100.0)
Monocytes Absolute: 0.6 10*3/uL (ref 0.1–1.0)
Monocytes Relative: 9 %
Neutro Abs: 5.1 10*3/uL (ref 1.7–7.7)
Neutrophils Relative %: 69 %
Platelet Count: 248 10*3/uL (ref 150–400)
RBC: 4.81 MIL/uL (ref 4.22–5.81)
RDW: 13.3 % (ref 11.5–15.5)
WBC Count: 7.2 10*3/uL (ref 4.0–10.5)
nRBC: 0 % (ref 0.0–0.2)

## 2023-07-21 LAB — TSH: TSH: 4.39 u[IU]/mL (ref 0.350–4.500)

## 2023-07-21 MED ORDER — SODIUM CHLORIDE 0.9 % IV SOLN: Freq: Once | INTRAVENOUS | Status: AC

## 2023-07-21 MED ORDER — SODIUM CHLORIDE 0.9% FLUSH
10.0000 mL | INTRAVENOUS | Status: DC | PRN
  Administered 2023-07-21: 10 mL

## 2023-07-21 MED ORDER — SODIUM CHLORIDE 0.9 % IV SOLN
350.0000 mg | Freq: Once | INTRAVENOUS | Status: AC
  Administered 2023-07-21: 350 mg via INTRAVENOUS
  Filled 2023-07-21: qty 7

## 2023-07-21 MED ORDER — SODIUM CHLORIDE 0.9% FLUSH: 10.0000 mL | INTRAVENOUS | Status: DC | PRN

## 2023-07-21 MED ORDER — HEPARIN SOD (PORK) LOCK FLUSH 100 UNIT/ML IV SOLN
500.0000 [IU] | Freq: Once | INTRAVENOUS | Status: DC | PRN
Start: 2023-07-21 — End: 2023-07-21

## 2023-07-21 NOTE — Patient Instructions (Signed)
 [facility] - A DEPT OF [facility]  Discharge Instructions: Thank you for choosing [facility] to provide your oncology and hematology care.   If you have a lab appointment with the Cancer Center, please go directly to the Cancer Center and check in at the registration area.   Wear comfortable clothing and clothing appropriate for easy access to any Portacath or PICC line.   We strive to give you quality time with your provider. You may need to reschedule your appointment if you arrive late (15 or more minutes).  Arriving late affects you and other patients whose appointments are after yours.  Also, if you miss three or more appointments without notifying the office, you may be dismissed from the clinic at the provider's discretion.      For prescription refill requests, have your pharmacy contact our office and allow 72 hours for refills to be completed.    Today you received the following chemotherapy and/or immunotherapy agents libtayo      To help prevent nausea and vomiting after your treatment, we encourage you to take your nausea medication as directed.  BELOW ARE SYMPTOMS THAT SHOULD BE REPORTED IMMEDIATELY: *FEVER GREATER THAN 100.4 F (38 C) OR HIGHER *CHILLS OR SWEATING *NAUSEA AND VOMITING THAT IS NOT CONTROLLED WITH YOUR NAUSEA MEDICATION *UNUSUAL SHORTNESS OF BREATH *UNUSUAL BRUISING OR BLEEDING *URINARY PROBLEMS (pain or burning when urinating, or frequent urination) *BOWEL PROBLEMS (unusual diarrhea, constipation, pain near the anus) TENDERNESS IN MOUTH AND THROAT WITH OR WITHOUT PRESENCE OF ULCERS (sore throat, sores in mouth, or a toothache) UNUSUAL RASH, SWELLING OR PAIN  UNUSUAL VAGINAL DISCHARGE OR ITCHING   Items with * indicate a potential emergency and should be followed up as soon as possible or go to the Emergency Department if any problems should occur.  Please show the CHEMOTHERAPY ALERT CARD or IMMUNOTHERAPY  ALERT CARD at check-in to the Emergency Department and triage nurse.  Should you have questions after your visit or need to cancel or reschedule your appointment, please contact [facility] - A DEPT OF [facility]  Dept: [phone]  and follow the prompts.  Office hours are 8:00 a.m. to 4:30 p.m. Monday - Friday. Please note that voicemails left after 4:00 p.m. may not be returned until the following business day.  We are closed weekends and major holidays. You have access to a nurse at all times for urgent questions. Please call the main number to the clinic Dept: [phone] and follow the prompts.   For any non-urgent questions, you may also contact your provider using MyChart. We now offer e-Visits for anyone 104 and older to request care online for non-urgent symptoms. For details visit [email or web address].   Also download the MyChart app! Go to the app store, search "MyChart", open the app, select [facility], and log in with your MyChart username and password.

## 2023-07-21 NOTE — Progress Notes (Signed)
 [facility] [phone]   [phone]  PROGRESS NOTE  Patient Care Team: Bertha Broad, MD as PCP - General (Internal Medicine) Homero Luster, MD as Attending Physician (Urology) Colie Dawes, MD as Consulting Physician (Radiation Oncology) Malmfelt, Nancyann Aye, RN as Oncology Nurse Navigator (Oncology) Gaynelle Keeling, MD as Consulting Physician (Dermatology) Daina Drum, MD as Consulting Physician (Gastroenterology) Ander Bame, MD as Consulting Physician (Hematology and Oncology)  Hematological/Oncological History # Bone Lesions on CT/MRI # 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 Additionally there was noted to be a new enhancing lesion at the tip of the right greater trochanter measuring up to 1.7 cm also suspicious for metastatic disease. 09/27/2020: establish care with Dr. Rosaline Coma 10/29/2020: CT biopsy performed of left iliac bone lesion. Pathology showed bone and marrow without evidence of carcinoma.  11/11/2022: Cycle 1 Day 1 of cemplimab therapy.  12/03/2022: Cycle 2 Day 1 of cemplimab therapy.  12/23/2022: Cycle 3 Day 1 of cemplimab therapy.  01/13/2023: Cycle 4 Day 1 of cemplimab therapy.  02/03/2023: Cycle 5 Day 1 of cemplimab therapy.  02/23/2023: Cycle 6 Day 1 of cemplimab therapy.  03/16/2023:  Cycle 7 Day 1 of cemplimab therapy.  04/06/2023: Cycle 8 Day 1 of cemplimab therapy.  04/28/2023: Cycle 9 Day 1 of cemplimab therapy.  05/18/2023: Cycle 10 Day 1 of cemplimab therapy.  06/08/2023: Cycle 11 Day 1 of cemplimab therapy.  06/30/2023: Cycle 12 Day 1 of cemplimab therapy.  07/21/2023: Cycle 13 Day 1 of cemplimab  therapy.   Interval History:  DISHON KEHOE 89 y.o. 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 The patient's last visit was on 06/30/2023. In the interim since the last visit he has completed Cycle 12 of cemiplimab .   On exam today Mr. Wenke reports his last cycle of treatment went quite well with no major side effects or issues.  He does continue to have some fatigue but no nausea, vomiting, or diarrhea.  He does endorse having some occasional loose stools but not frank diarrhea.  He reports he is not currently experiencing any pain.  He started to have a little more discharge from his ear with some bloating.  Reports that he overall feels well and is willing and able to proceed with Cemiplimab  therapy at this time.  He denies any fevers, chills, sweats, cough, shortness of breath.  A full 10 point ROS is otherwise negative.   MEDICAL HISTORY:  Past Medical History:  Diagnosis Date   Atypical nevus 09/07/2002   left abdomen-slight   BCC (basal cell carcinoma of skin) 07/19/2014   left sideburn- +margin-exc.   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 if orignated from skin cancer   Nodular basal cell carcinoma (BCC) 11/21/2012   right side of scalp-CX35FU   Nodular basal cell carcinoma (BCC) 06/12/2014   left sideburn-CX35FU/exc.   Nodular basal cell carcinoma (BCC) 07/16/2015   left  scalp-CX35FU   Polycythemia    Prostate cancer (HCC) 1990's   total prostatectomy and adjuvant radiation. Last PSA was 0.02 in 09/2011   SCC (squamous cell carcinoma) 04/03/2009   forehead-txpbx   SCC (squamous cell carcinoma) 07/16/2015   left front scalp   SCC (squamous cell carcinoma) 07/16/2015   left front scalp-Cx35FU   SCCA (squamous cell carcinoma) of skin 12/20/2019   Right forehead (in situ)   SCCA (squamous cell carcinoma) of skin 05/27/2020   Mid Parietal Scalp (in situ)   Superficial basal cell carcinoma (BCC) 11/21/2012   behind left ear-CX35FU, midback-CX35FU, right forehead-CX35FU-exc   Superficial basal cell carcinoma (BCC) 11/02/2017   left post neck-CX35FU   Superficial nodular basal cell carcinoma (BCC) 12/20/2019   Left temporal scalp   Superficial nodular basal cell carcinoma (BCC) 05/27/2020   Right Preauricular area (curet and 5FU)    SURGICAL HISTORY: Past Surgical History:  Procedure Laterality Date   ADJACENT TISSUE TRANSFER/TISSUE REARRANGEMENT N/A 05/22/2019   Procedure: COMPLEX CLOSURE OF NECK WOUND;  Surgeon: Barb Bonito, MD;  Location: [facility] OR;  Service: Plastics;  Laterality: N/A;   ALLOGRAFT APPLICATION N/A 05/22/2019   Procedure: POSSIBLE FACIAL NERVE RECONSTRUCTION WITH NERVE ALLOGRAFT VS AUTOGRAFT;  Surgeon: Barb Bonito, MD;  Location: [facility] OR;  Service: Plastics;  Laterality: N/A;   BLADDER SURGERY     due to prostatectomy.    CATARACT EXTRACTION     right   COLONOSCOPY     neg in the past; due in 2014 with  Dr. Adan Holms   EYE SURGERY     1950   HERNIA REPAIR     IR IMAGING GUIDED PORT INSERTION  11/30/2022   MOHS SURGERY     NECK DISSECTION  05/22/2019    NECK DISSECTION   PAROTIDECTOMY  05/22/2019   PAROTIDECTOMY with facial nerve dissection    PAROTIDECTOMY Right 05/22/2019   Procedure: PAROTIDECTOMY;  Surgeon: Janita Mellow, MD;  Location: [facility] OR;  Service: ENT;  Laterality: Right;   PROSTATE SURGERY     RADICAL NECK  DISSECTION Right 05/22/2019   Procedure: RADICAL NECK DISSECTION;  Surgeon: Janita Mellow, MD;  Location: [facility] OR;  Service: ENT;  Laterality: Right;   TONSILLECTOMY      SOCIAL HISTORY: Social History   Socioeconomic History   Marital status: Married    Spouse name: Not on file   Number of children: 3   Years of education: Not on file   Highest education level: Not on file  Occupational History    Comment: retired Scientist, [employer]; 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   Occupation: retired  Tobacco Use   Smoking status: Never   Smokeless tobacco: Never  Vaping Use   Vaping status: Never Used  Substance and Sexual Activity   Alcohol  use: No   Drug use: No   Sexual activity: Not on file  Other Topics Concern   Not on file  Social History Narrative   Not on file   Social Drivers of Health   Financial Resource Strain: Not on file  Food Insecurity: No Food Insecurity (  10/29/2022)   Hunger Vital Sign    Worried About Running Out of Food in the Last Year: Never true    Ran Out of Food in the Last Year: Never true  Transportation Needs: No Transportation Needs (10/29/2022)   PRAPARE - Administrator, Civil Service (Medical): No    Lack of Transportation (Non-Medical): No  Physical Activity: Not on file  Stress: Not on file  Social Connections: Not on file  Intimate Partner Violence: Not At Risk (10/29/2022)   Humiliation, Afraid, Rape, and Kick questionnaire    Fear of Current or Ex-Partner: No    Emotionally Abused: No    Physically Abused: No    Sexually Abused: No    FAMILY HISTORY: Family History  Problem Relation Age of Onset   Uterine cancer Mother 24   Ovarian cancer Mother    Lung cancer Father 55   Prostate cancer Brother    Prostate cancer Brother    Colon cancer Neg Hx    Stomach cancer Neg Hx    Esophageal cancer Neg Hx    Rectal cancer Neg Hx     ALLERGIES:  is allergic to methylprednisolone, penicillins, latex, and tape.  MEDICATIONS:  Current Outpatient  Medications  Medication Sig Dispense Refill   amLODipine  (NORVASC ) 5 MG tablet Take 5 mg by mouth daily.      aspirin  81 MG tablet Take 81 mg by mouth daily.     benazepril  (LOTENSIN ) 10 MG tablet Take 10 mg by mouth daily.     famotidine -calcium carbonate-magnesium hydroxide (PEPCID  COMPLETE) 10-800-165 MG chewable tablet Take one tablet PRN     levothyroxine  (SYNTHROID ) 25 MCG tablet Take 25 mcg by mouth daily before breakfast. 2 tABS M,W,F and one the other days     lidocaine  (LIDODERM ) 5 % Place 1 patch onto the skin daily. Remove & Discard patch within 12 hours or as directed by MD 30 patch 0   lidocaine -prilocaine  (EMLA ) cream Apply 1 Application topically as needed. 30 g 0   Multiple Vitamin (MULTIVITAMIN) tablet Take 1 tablet by mouth daily.     nystatin  (MYCOSTATIN ) 100000 UNIT/ML suspension Take 5 mLs (500,000 Units total) by mouth 3 (three) times daily as needed. 60 mL 0   ondansetron  (ZOFRAN ) 8 MG tablet Take 1 tablet (8 mg total) by mouth every 8 (eight) hours as needed for nausea or vomiting. (Patient not taking: Reported on 06/30/2023) 20 tablet 1   potassium citrate  (UROCIT-K ) 10 MEQ (1080 MG) SR tablet Take 10 mEq by mouth 3 (three) times daily with meals.     simvastatin  (ZOCOR ) 20 MG tablet Take 20 mg by mouth every evening.     No current facility-administered medications for this visit.   Facility-Administered Medications Ordered in Other Visits  Medication Dose Route Frequency Provider Last Rate Last Admin   heparin  lock flush 100 unit/mL  500 Units Intracatheter Once PRN Kavi Almquist T IV, MD       sodium chloride  flush (NS) 0.9 % injection 10 mL  10 mL Intracatheter PRN Devlynn Knoff T IV, MD        REVIEW OF SYSTEMS:   Constitutional: ( - ) fevers, ( - )  chills , ( - ) night sweats Eyes: ( - ) blurriness of vision, ( - ) double vision, ( - ) watery eyes Ears, nose, mouth, throat, and face: ( - ) mucositis, ( - ) sore throat Respiratory: ( - ) cough, ( - ) dyspnea, (  - )  wheezes Cardiovascular: ( - ) palpitation, ( - ) chest discomfort, ( - ) lower extremity swelling Gastrointestinal:  ( - ) nausea, ( - ) heartburn, ( - ) change in bowel habits Skin: ( - ) abnormal skin rashes Lymphatics: ( - ) new lymphadenopathy, ( - ) easy bruising Neurological: ( - ) numbness, ( - ) tingling, ( - ) new weaknesses Behavioral/Psych: ( - ) mood change, ( - ) new changes  All other systems were reviewed with the patient and are negative.  PHYSICAL EXAMINATION: ECOG PERFORMANCE STATUS: 1 - Symptomatic but completely ambulatory  Vitals:   07/21/23 1036  BP: 122/73  Pulse: 64  Resp: 13  Temp: (!) 97.3 F (36.3 C)  SpO2: 97%     Filed Weights   07/21/23 1036  Weight: 184 lb 4.8 oz (83.6 kg)      GENERAL: Well-appearing elderly Caucasian male, alert, no distress and comfortable SKIN: skin color, texture, turgor are normal, no rashes or significant lesions EYES: conjunctiva are pink and non-injected, sclera clear LUNGS: clear to auscultation and percussion with normal breathing effort HEART: regular rate & rhythm and no murmurs and no lower extremity edema Musculoskeletal: no cyanosis of digits and no clubbing  PSYCH: alert & oriented x 3, fluent speech NEURO: no focal motor/sensory deficits  LABORATORY DATA:  I have reviewed the data as listed    Latest Ref Rng & Units 07/21/2023    9:48 AM 06/30/2023    9:43 AM 06/09/2023   11:21 AM  CBC  WBC 4.0 - 10.5 K/uL 7.2  6.6  8.3   Hemoglobin 13.0 - 17.0 g/dL 16.1  09.6  04.5   Hematocrit 39.0 - 52.0 % 43.0  43.1  44.5   Platelets 150 - 400 K/uL 248  229  252        Latest Ref Rng & Units 07/21/2023    9:48 AM 06/30/2023    9:43 AM 06/09/2023   11:21 AM  CMP  Glucose 70 - 99 mg/dL 409  811  90   BUN 8 - 23 mg/dL 22  17  16    Creatinine 0.61 - 1.24 mg/dL 9.14  7.82  9.56   Sodium 135 - 145 mmol/L 138  140  141   Potassium 3.5 - 5.1 mmol/L 4.1  4.0  4.2   Chloride 98 - 111 mmol/L 107  106  110   CO2 22  - 32 mmol/L 24  27  26    Calcium 8.9 - 10.3 mg/dL 9.8  9.8  9.8   Total Protein 6.5 - 8.1 g/dL 6.9  7.0  7.0   Total Bilirubin 0.0 - 1.2 mg/dL 0.8  0.6  0.5   Alkaline Phos 38 - 126 U/L 73  68  75   AST 15 - 41 U/L 25  20  19    ALT 0 - 44 U/L 26  23  19      Lab Results  Component Value Date   MPROTEIN Not Observed 09/27/2020   Lab Results  Component Value Date   KPAFRELGTCHN 25.6 (H) 09/27/2020   LAMBDASER 23.0 09/27/2020   KAPLAMBRATIO 1.11 09/27/2020    RADIOGRAPHIC STUDIES: No results found.    ASSESSMENT & PLAN DRAGON THRUSH is a 89 y.o. 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  At this time the patient's clinical findings are complicated.  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  It is unclear if these 2 processes are the same or 2 separate primaries.  Additionally there is no clear evidence of a primary squamous cell carcinoma (however my concern is that the area behind the right ear may represent the same process).  Patient also does have PET avid areas of his bowels which I do not suspect are related, but we will have evaluated further with EGD/colonoscopy as needed.  At this time the best treatment course would be to treat with Cemiplimab  which should be effective at treating both basal cell carcinoma and squamous cell carcinoma, effectively treating both even if they are separate processes.  Overall he voices understanding of this complicated situation and our plan moving forward.  Of note radiation oncology and ENT are on board and willing to assist with management of his current findings.  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 Previously  discussed with radiology who notes a repeat biopsy was not recommended and repeat imaging would be preferred.  --negative multiple myeloma labs with SPEP and serum free light chains showing no evidence of monoclonal gammopathy.  --Given his prior history of prostate cancer we ordered a PSA, which was normal. Repeated PSA previously, found to be within normal limits --CT C/A/P shows no evidence of disease elsewhere in the body in August 2022. Most recent PET CT scan on 10/02/2022 showed hypermetabolic osseous metastasis with a dominant mass centered about the inferior medial portion of the right mastoid, suspicious for site of primary squamous cell carcinoma. -- PET CT scan shows evidence of lesion behind the right ear as well as the known pelvic metastasis. -- Reviewed tissue with pathology to determine if both lesions are the same pathology or different origins.  At this time findings are most consistent with a basal cell primary behind his ear and squamous cell carcinoma of the pelvis.  It is possible these at the same pathology with the basal/squamous crossover, though they may represent separate primaries.  Either way Cemiplimab  therapy should be adequate to treat both. -- PD-L1 testing showed 0% PD-L1 score, however the use of cemiplimab  does not require PD-L1 positivity (lack of PD-L1 prevents the use of pembrolizumab)  PLAN: --Proceed with cycle 13 day 1 of Cemiplimab  immunotherapy today --Labs today show white blood cell count 7.2, hemoglobin 15.1, MCV 89.4, platelets 248 --PET CT scan on 01/25/2023 showed response to therapy as evidenced by decrease in FDG avidity within a destructive soft tissue mass involving the right mastoid as well as within osseous metastases.  Also previously noted hypermetabolic cecal mass, highly worrisome for colon carcinoma (colonoscopy confirmed polyps with no active malignancy . PET CT scan on 05/03/2023 showed no new or progressive disease with overall response in most  areas.  PET CT scan due before next clinic visit in June 2025. -- Return to clinic for next Cycle of cemiplimab  in 3 weeks time.   # Bleeding of the Ear--mild -- secondary due to erosion of the tumor into his ear canal. -- wound has healed without evidence of bleeding.  --Minor orange color discharge from the ear, mild bleeding at this time -- monitor for now.   # FDG Avid Colon Lesion --Patient underwent colonoscopy with gastroenterology and found to have large polyps. -- No evidence of colonic  malignancy.  Orders Placed This Encounter  Procedures   NM PET Image Restag (PS) Skull Base To Thigh    Standing Status:   Future    Expected Date:   08/03/2023    Expiration Date:   07/20/2024    If indicated for the ordered procedure, I authorize the administration of a radiopharmaceutical per Radiology protocol:   Yes    Preferred imaging location?:   [facility]   CBC with Differential (Cancer Center Only)    Standing Status:   Future    Expected Date:   11/03/2023    Expiration Date:   11/02/2024   CMP (Cancer Center only)    Standing Status:   Future    Expected Date:   11/03/2023    Expiration Date:   11/02/2024   T4    Standing Status:   Future    Expected Date:   11/03/2023    Expiration Date:   11/02/2024   TSH    Standing Status:   Future    Expected Date:   11/03/2023    Expiration Date:   11/02/2024    All questions were answered. The patient knows to call the clinic with any problems, questions or concerns.  A total of more than 30 minutes were spent on this encounter with face-to-face time and non-face-to-face time, including preparing to see the patient, ordering tests and/or medications, counseling the patient and coordination of care as outlined above.   Rogerio Clay, MD Department of Hematology/Oncology [facility] at [facility] Phone: [phone] Pager: [phone] Email: [email or web address]   07/21/2023 2:27 PM

## 2023-07-22 ENCOUNTER — Other Ambulatory Visit: Payer: Self-pay

## 2023-07-22 LAB — T4: T4, Total: 8.6 ug/dL (ref 4.5–12.0)

## 2023-07-24 ENCOUNTER — Other Ambulatory Visit: Payer: Self-pay

## 2023-07-30 ENCOUNTER — Encounter (HOSPITAL_COMMUNITY)
Admission: RE | Admit: 2023-07-30 | Discharge: 2023-07-30 | Disposition: A | Discharge status: Home / Self Care | Source: Ambulatory Visit | Attending: Hematology and Oncology | Admitting: Hematology and Oncology

## 2023-07-30 DIAGNOSIS — C4492 Squamous cell carcinoma of skin, unspecified: Secondary | ICD-10-CM | POA: Insufficient documentation

## 2023-07-30 DIAGNOSIS — C4490 Unspecified malignant neoplasm of skin, unspecified: Secondary | ICD-10-CM | POA: Diagnosis not present

## 2023-07-30 LAB — GLUCOSE, CAPILLARY: Glucose-Capillary: 115 mg/dL — ABNORMAL HIGH (ref 70–99)

## 2023-07-30 MED ORDER — FLUDEOXYGLUCOSE F - 18 (FDG) INJECTION
9.1000 | Freq: Once | INTRAVENOUS | Status: AC | PRN
  Administered 2023-07-30: 9.1 via INTRAVENOUS

## 2023-08-03 ENCOUNTER — Ambulatory Visit: Admitting: Internal Medicine

## 2023-08-03 ENCOUNTER — Encounter: Payer: Self-pay | Admitting: Internal Medicine

## 2023-08-03 VITALS — BP 120/72 | HR 82 | Ht 69.0 in | Wt 183.6 lb

## 2023-08-03 DIAGNOSIS — K219 Gastro-esophageal reflux disease without esophagitis: Secondary | ICD-10-CM

## 2023-08-03 DIAGNOSIS — Z860100 Personal history of colon polyps, unspecified: Secondary | ICD-10-CM | POA: Diagnosis not present

## 2023-08-03 DIAGNOSIS — K5730 Diverticulosis of large intestine without perforation or abscess without bleeding: Secondary | ICD-10-CM

## 2023-08-03 DIAGNOSIS — R933 Abnormal findings on diagnostic imaging of other parts of digestive tract: Secondary | ICD-10-CM | POA: Diagnosis not present

## 2023-08-03 NOTE — Patient Instructions (Addendum)
 Return to clinic in 6 months   If your blood pressure at your visit was 140/90 or greater, please contact your primary care physician to follow up on this.  _______________________________________________________  If you are age 88 or older, your body mass index should be between 23-30. Your Body mass index is 27.11 kg/m. If this is out of the aforementioned range listed, please consider follow up with your Primary Care Provider.  If you are age 79 or younger, your body mass index should be between 19-25. Your Body mass index is 27.11 kg/m. If this is out of the aformentioned range listed, please consider follow up with your Primary Care Provider.   ________________________________________________________  The [facility] providers would like to encourage you to use MYCHART to communicate with providers for non-urgent requests or questions.  Due to long hold times on the telephone, sending your provider a message by [facility] may be a faster and more efficient way to get a response.  Please allow 48 business hours for a response.  Please remember that this is for non-urgent requests.  _______________________________________________________  Thank you for entrusting me with your care and for choosing [facility],  Dr. Regino Caprio

## 2023-08-03 NOTE — Progress Notes (Signed)
 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  PET CT scan on 8/9 showed osseous metastasis, dominant mass centered in the inferior medial portion of the right mastoid suspicious for primary squamous cell cancer, hypermetabolic thoracic adenopathy, ascending colon hypermetabolism that is suspicious for colon polyp or carcinoma, mild distal esophageal hypermetabolism in the setting of tiny hiatal hernia likely related to esophagitis, hypermetabolic right thyroid  nodule, and tiny hiatal hernia.  Patient underwent colonoscopy in 01/2023 that revealed a large cecal polyp along with several other colon polyps that were removed. Denies family history of GI malignancy.  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  Interval History: He is overall doing well. Denies blood in the stools. Bowel habits have been normal. He has occasional diarrhea that is not that bothersome. He has at least one BM per day. Denies abdominal pain. He is still on immunotherapy for squamous cell cancer and currently has a PET CT scan that is pending.  Wt Readings from Last 3 Encounters:  08/03/23 183 lb 9.6 oz (83.3 kg)  07/21/23 184 lb 4.8 oz (83.6 kg)  06/30/23 184 lb 3.2 oz (83.6 kg)   Past Medical History:  Diagnosis Date   Atypical nevus 09/07/2002   left abdomen-slight   BCC (basal cell carcinoma of skin) 07/19/2014   left sideburn- +margin-exc.   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 if orignated from skin cancer   Nodular basal cell carcinoma (BCC) 11/21/2012   right side of scalp-CX35FU   Nodular basal cell carcinoma (BCC) 06/12/2014   left sideburn-CX35FU/exc.   Nodular basal cell carcinoma (BCC) 07/16/2015   left scalp-CX35FU   Polycythemia    Prostate cancer (HCC) 1990's   total prostatectomy and adjuvant radiation. Last PSA was 0.02 in 09/2011   SCC (squamous cell carcinoma) 04/03/2009   forehead-txpbx   SCC (squamous cell carcinoma) 07/16/2015   left front scalp   SCC (squamous cell carcinoma) 07/16/2015   left front scalp-Cx35FU   SCCA (squamous cell carcinoma) of skin 12/20/2019   Right forehead (in situ)   SCCA (squamous cell carcinoma) of skin 05/27/2020   Mid Parietal Scalp (in situ)   Superficial basal cell carcinoma (BCC) 11/21/2012   behind left ear-CX35FU, midback-CX35FU, right forehead-CX35FU-exc   Superficial basal cell carcinoma (BCC) 11/02/2017   left post neck-CX35FU   Superficial nodular basal cell carcinoma (BCC) 12/20/2019   Left temporal scalp   Superficial nodular basal cell carcinoma (BCC) 05/27/2020   Right Preauricular area (curet and 5FU)     Past Surgical History:  Procedure Laterality Date  ADJACENT TISSUE TRANSFER/TISSUE REARRANGEMENT N/A 05/22/2019   Procedure: COMPLEX CLOSURE OF NECK WOUND;  Surgeon: Barb Bonito, MD;  Location: [facility] OR;  Service: Plastics;  Laterality: N/A;   ALLOGRAFT APPLICATION N/A 05/22/2019   Procedure: POSSIBLE FACIAL NERVE RECONSTRUCTION WITH NERVE ALLOGRAFT VS AUTOGRAFT;  Surgeon: Barb Bonito, MD;  Location: [facility] OR;  Service: Plastics;  Laterality: N/A;    BLADDER SURGERY     due to prostatectomy.    CATARACT EXTRACTION     right   COLONOSCOPY     neg in the past; due in 2014 with [place] Dr. Adan Holms   EYE SURGERY     1950   HERNIA REPAIR     IR IMAGING GUIDED PORT INSERTION  11/30/2022   MOHS SURGERY     NECK DISSECTION  05/22/2019    NECK DISSECTION   PAROTIDECTOMY  05/22/2019   PAROTIDECTOMY with facial nerve dissection    PAROTIDECTOMY Right 05/22/2019   Procedure: PAROTIDECTOMY;  Surgeon: Janita Mellow, MD;  Location[facility];  Service: ENT;  Laterality: Right;   PROSTATE SURGERY     RADICAL NECK DISSECTION Right 05/22/2019   Procedure: RADICAL NECK DISSECTION;  Surgeon: Janita Mellow, MD;  Location: [facility] OR;  Service: ENT;  Laterality: Right;   TONSILLECTOMY     Family History  Problem Relation Age of Onset   Uterine cancer Mother 3   Ovarian cancer Mother    Lung cancer Father 37   Prostate cancer Brother    Prostate cancer Brother    Colon cancer Neg Hx    Stomach cancer Neg Hx    Esophageal cancer Neg Hx    Rectal cancer Neg Hx    Social History   Tobacco Use   Smoking status: Never   Smokeless tobacco: Never  Vaping Use   Vaping status: Never Used  Substance Use Topics   Alcohol  use: No   Drug use: No   Current Outpatient Medications  Medication Sig Dispense Refill   amLODipine  (NORVASC ) 5 MG tablet Take 5 mg by mouth daily.      aspirin  81 MG tablet Take 81 mg by mouth daily.     benazepril  (LOTENSIN ) 10 MG tablet Take 10 mg by mouth daily.     famotidine -calcium carbonate-magnesium hydroxide (PEPCID  COMPLETE) 10-800-165 MG chewable tablet Take one tablet PRN     levothyroxine  (SYNTHROID ) 25 MCG tablet Take 25 mcg by mouth daily before breakfast. 2 tABS M,W,F and one the other days     lidocaine -prilocaine  (EMLA ) cream Apply 1 Application topically as needed. 30 g 0   Multiple Vitamin (MULTIVITAMIN) tablet Take 1 tablet by mouth daily.     ondansetron  (ZOFRAN ) 8 MG tablet Take 1 tablet (8 mg total) by mouth  every 8 (eight) hours as needed for nausea or vomiting. 20 tablet 1   potassium citrate  (UROCIT-K ) 10 MEQ (1080 MG) SR tablet Take 10 mEq by mouth 3 (three) times daily with meals.     simvastatin  (ZOCOR ) 20 MG tablet Take 20 mg by mouth every evening.     No current facility-administered medications for this visit.   Allergies  Allergen Reactions   Methylprednisolone     Severe hiccups   Penicillins     Did it involve swelling of the face/tongue/throat, SOB, or low BP? Unknown Did it involve sudden or severe rash/hives, skin peeling, or any reaction on the inside of your mouth or nose? Unknown Did you need to seek medical attention at a hospital or doctor's  office? Unknown When did it last happen?      Childhood allergy If all above answers are "NO", may proceed with cephalosporin use.    Latex Rash   Tape Rash    Skin gets red and a rash develops with "plastic tape" Skin gets red and a rash develops with "plastic tape"   Physical Exam: BP 120/72   Pulse 82   Ht 5\' 9"  (1.753 m)   Wt 183 lb 9.6 oz (83.3 kg)   BMI 27.11 kg/m  Constitutional: Pleasant,well-developed, male in no acute distress. HEENT: Bandage over the right portion of his head Cardiovascular: Normal rate, regular rhythm.  Pulmonary/chest: Effort normal and breath sounds normal. No wheezing, rales or rhonchi. Abdominal: Soft, nondistended, nontender. Bowel sounds active throughout. There are no masses palpable. No hepatomegaly. Extremities: No edema Neurological: Alert and oriented to person place and time. Skin: Skin is warm and dry. No rashes noted. Psychiatric: Normal mood and affect. Behavior is normal.  Labs 08/2022: CBC normal.  INR nml.   Labs 09/2022: CBC unremarkable.  CMP with elevated creatinine of 1.36.  TSH normal.  Free T4 normal.  Labs 06/2023: CBC nml. CMP with elevated Cr of 1.34. TSH and FT4 nml.   PET/CT 10/02/22: IMPRESSION: 1. Hypermetabolic osseous metastasis. 2. Dominant mass centered  about the inferior medial portion of the right mastoid, suspicious for site of primary squamous cell carcinoma. 3. Hypermetabolic thoracic adenopathy. Although this could represent nodal metastasis, relative size stability since 10/09/2020 and faint nodal calcification suggest alternative etiologies such as sarcoidosis. 4. Ascending colonic hypermetabolism and soft tissue fullness are highly suspicious for colonic polyp or carcinoma. 5. Right thyroid  nodule is hypermetabolic but stable in size since a CT of 02/02/2020. Given patient age and comorbidities, of questionable clinical significance. If further evaluation is desired, thyroid  ultrasound and possible biopsy could be performed. 6. Incidental findings, including: Tiny hiatal hernia. Similar ascending aortic dilatation. Coronary artery atherosclerosis. Aortic Atherosclerosis (ICD10-I70.0). Right renal artery aneurysm. Left inguinal hernia containing nonobstructive sigmoid.  PET CT 05/03/23: IMPRESSION: 1. Overall improved appearance of the large upper right neck and right mastoid destructive process. Less soft tissue density with likely contracting scar tissue and resolving tumor. Significant interval decrease in FDG uptake. 2. Stable sclerotic metastatic bone disease but these lesions are no longer hypermetabolic suggesting an excellent response to treatment. 3. Stable hypermetabolic mediastinal and hilar nodes, likely chronic granulomatis disease. 4. No findings for new or progressive disease. 5. Stable 2.6 cm right thyroid  lobe lesion with SUV max of 5.14. 6. Stable fusiform aneurysmal dilatation of the ascending thoracic aorta and stable aortic and coronary artery calcifications. 7. Stable left inguinal hernia containing part of the sigmoid colon. 8. Status post prostatectomy. 9. Aortic atherosclerosis.  Colonoscopy 11/27/02: Diverticulosis from the descending to sigmoid colon.  No polyps noted.  Excellent prep.  Colonoscopy  02/08/23: - The examined portion of the ileum was normal. - One 25 mm polyp in the cecum, removed with mucosal resection. Snare tip cautery applied. Resected and retrieved. - One 15 mm polyp in the cecum, removed with a hot snare. Resected and retrieved. - 12 3 to 12 mm polyps in the transverse colon, in the ascending colon and in the cecum, removed with a cold snare. Resected and retrieved. - Diverticulosis in the sigmoid colon and in the descending colon. - Five 3 to 6 mm polyps in the rectum and in the sigmoid colon, removed with a cold snare. Resected and retrieved. - Non- bleeding internal  hemorrhoids. - Mucosal resection was performed. Resection and retrieval were complete. Path: 1. Surgical [P], colon, cecum, ascending, transverse, polyp (12) :      - TUBULOVILLOUS ADENOMA(S) AND TUBULAR ADENOMA(S) WITH FOCI OF HIGH-GRADE DYSPLASIA      - NEGATIVE FOR INVASIVE CARCINOMA      2. Surgical [P], colon, cecum, polyp (2) :      - TUBULOVILLOUS ADENOMA(S) AND TUBULAR ADENOMA(S) WITH RARE FOCI APPROACHING      HIGH-GRADE DYSPLASIA      - NEGATIVE FOR INVASIVE CARCINOMA      3. Surgical [P], colon, rectum and sigmoid, polyp (5) :      - TUBULAR ADENOMA(S) WITHOUT HIGH-GRADE DYSPLASIA OR MALIGNANCY      - HYPERPLASTIC POLYP(S)  ASSESSMENT AND PLAN: History of colon polyps Abnormal PET CT scan GERD Diverticulosis Patient had a colonoscopy in 01/2023 due to presence of hypermetabolism in the ascending colon and was found to have a large 2.5 cm cecal polyp that was removed with EMR as well as 18 other colon polyps that were removed. Pathology came back as mostly TVAs and TAs. Typically after a piecemeal resection of a polyp >2 cm, repeat colonoscopy is pursued after 6 months. However, because the patient is already undergoing treatment of squamous cell cancer and is 89 years old, we discussed the risks and benefits of undergoing another surveillance colonoscopy. Patient favored continuing to follow PET  CT scans to look for any recurrence of hypermetabolism of the colon for now. He is actively on immunotherapy for his metastatic squamous cell cancer. If there is FDG avidity in the colon, we can readdress the idea of a colonoscopy. If there is FDG avidity in the colon, then we can plan to have another discussion about whether or not to pursue repeat colonoscopy in 6 months. Hopefully at that time we will have a better idea of whether or not he is responding well to immunotherapy. - If PET CT scan shows colon activity, we will reconsider earlier colonoscopy. Messaged his oncologist Dr. Rosaline Coma to let me know if this is the case - RTC 6 months. Will plan to re-address the issue of colonoscopy at that time  Regino Caprio, MD  I spent 31 minutes of time, including in depth chart review, independent review of results as outlined above, communicating results with the patient directly, face-to-face time with the patient, coordinating care, ordering studies and medications as appropriate, and documentation.

## 2023-08-11 ENCOUNTER — Inpatient Hospital Stay: Attending: Hematology and Oncology

## 2023-08-11 ENCOUNTER — Inpatient Hospital Stay: Admitting: Hematology and Oncology

## 2023-08-11 ENCOUNTER — Inpatient Hospital Stay

## 2023-08-11 VITALS — BP 119/69 | HR 64 | Temp 98.3°F | Resp 14 | Wt 185.5 lb

## 2023-08-11 DIAGNOSIS — Z95828 Presence of other vascular implants and grafts: Secondary | ICD-10-CM

## 2023-08-11 DIAGNOSIS — Z79620 Long term (current) use of immunosuppressive biologic: Secondary | ICD-10-CM | POA: Diagnosis not present

## 2023-08-11 DIAGNOSIS — C4441 Basal cell carcinoma of skin of scalp and neck: Secondary | ICD-10-CM | POA: Diagnosis not present

## 2023-08-11 DIAGNOSIS — Z5112 Encounter for antineoplastic immunotherapy: Secondary | ICD-10-CM | POA: Diagnosis not present

## 2023-08-11 DIAGNOSIS — C4492 Squamous cell carcinoma of skin, unspecified: Secondary | ICD-10-CM

## 2023-08-11 DIAGNOSIS — C7951 Secondary malignant neoplasm of bone: Secondary | ICD-10-CM

## 2023-08-11 LAB — CMP (CANCER CENTER ONLY)
ALT: 18 U/L (ref 0–44)
AST: 20 U/L (ref 15–41)
Albumin: 4.1 g/dL (ref 3.5–5.0)
Alkaline Phosphatase: 70 U/L (ref 38–126)
Anion gap: 4 — ABNORMAL LOW (ref 5–15)
BUN: 14 mg/dL (ref 8–23)
CO2: 28 mmol/L (ref 22–32)
Calcium: 9.8 mg/dL (ref 8.9–10.3)
Chloride: 108 mmol/L (ref 98–111)
Creatinine: 1.29 mg/dL — ABNORMAL HIGH (ref 0.61–1.24)
GFR, Estimated: 53 mL/min — ABNORMAL LOW (ref >60)
Glucose, Bld: 111 mg/dL — ABNORMAL HIGH (ref 70–99)
Potassium: 4.1 mmol/L (ref 3.5–5.1)
Sodium: 140 mmol/L (ref 135–145)
Total Bilirubin: 0.5 mg/dL (ref 0.0–1.2)
Total Protein: 6.7 g/dL (ref 6.5–8.1)

## 2023-08-11 LAB — CBC WITH DIFFERENTIAL (CANCER CENTER ONLY)
Abs Immature Granulocytes: 0.02 10*3/uL (ref 0.00–0.07)
Basophils Absolute: 0 10*3/uL (ref 0.0–0.1)
Basophils Relative: 1 %
Eosinophils Absolute: 0.1 10*3/uL (ref 0.0–0.5)
Eosinophils Relative: 2 %
HCT: 43.5 % (ref 39.0–52.0)
Hemoglobin: 15 g/dL (ref 13.0–17.0)
Immature Granulocytes: 0 %
Lymphocytes Relative: 19 %
Lymphs Abs: 1.2 10*3/uL (ref 0.7–4.0)
MCH: 31.3 pg (ref 26.0–34.0)
MCHC: 34.5 g/dL (ref 30.0–36.0)
MCV: 90.6 fL (ref 80.0–100.0)
Monocytes Absolute: 0.6 10*3/uL (ref 0.1–1.0)
Monocytes Relative: 9 %
Neutro Abs: 4.4 10*3/uL (ref 1.7–7.7)
Neutrophils Relative %: 69 %
Platelet Count: 204 10*3/uL (ref 150–400)
RBC: 4.8 MIL/uL (ref 4.22–5.81)
RDW: 13.5 % (ref 11.5–15.5)
WBC Count: 6.3 10*3/uL (ref 4.0–10.5)
nRBC: 0 % (ref 0.0–0.2)

## 2023-08-11 LAB — TSH: TSH: 4.7 u[IU]/mL — ABNORMAL HIGH (ref 0.350–4.500)

## 2023-08-11 MED ORDER — SODIUM CHLORIDE 0.9% FLUSH
10.0000 mL | INTRAVENOUS | Status: DC | PRN
  Administered 2023-08-11: 10 mL

## 2023-08-11 MED ORDER — SODIUM CHLORIDE 0.9 % IV SOLN
350.0000 mg | Freq: Once | INTRAVENOUS | Status: AC
  Administered 2023-08-11: 350 mg via INTRAVENOUS
  Filled 2023-08-11: qty 7

## 2023-08-11 MED ORDER — HEPARIN SOD (PORK) LOCK FLUSH 100 UNIT/ML IV SOLN
500.0000 [IU] | Freq: Once | INTRAVENOUS | Status: AC | PRN
  Administered 2023-08-11: 500 [IU]

## 2023-08-11 MED ORDER — SODIUM CHLORIDE 0.9 % IV SOLN: Freq: Once | INTRAVENOUS | Status: AC

## 2023-08-11 NOTE — Progress Notes (Signed)
 [facility] [phone]   [phone]  PROGRESS NOTE  Patient Care Team: Bertha Broad, MD as PCP - General (Internal Medicine) Homero Luster, MD as Attending Physician (Urology) Colie Dawes, MD as Consulting Physician (Radiation Oncology) Malmfelt, Nancyann Aye, RN as Oncology Nurse Navigator (Oncology) Gaynelle Keeling, MD as Consulting Physician (Dermatology) Daina Drum, MD as Consulting Physician (Gastroenterology) Ander Bame, MD as Consulting Physician (Hematology and Oncology)  Hematological/Oncological History # Bone Lesions on CT/MRI # 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 Additionally there was noted to be a new enhancing lesion at the tip of the right greater trochanter measuring up to 1.7 cm also suspicious for metastatic disease. 09/27/2020: establish care with Dr. Rosaline Coma 10/29/2020: CT biopsy performed of left iliac bone lesion. Pathology showed bone and marrow without evidence of carcinoma.  11/11/2022: Cycle 1 Day 1 of cemplimab therapy.  12/03/2022: Cycle 2 Day 1 of cemplimab therapy.  12/23/2022: Cycle 3 Day 1 of cemplimab therapy.  01/13/2023: Cycle 4 Day 1 of cemplimab therapy.  02/03/2023: Cycle 5 Day 1 of cemplimab therapy.  02/23/2023: Cycle 6 Day 1 of cemplimab therapy.  03/16/2023:  Cycle 7 Day 1 of cemplimab therapy.  04/06/2023: Cycle 8 Day 1 of cemplimab therapy.  04/28/2023: Cycle 9 Day 1 of cemplimab therapy.  05/18/2023: Cycle 10 Day 1 of cemplimab therapy.  06/08/2023: Cycle 11 Day 1 of cemplimab therapy.  06/30/2023: Cycle 12 Day 1 of cemplimab therapy.  07/21/2023: Cycle 13 Day 1 of cemplimab  therapy.   Interval History:  Martin Curry 89 y.o. 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 The patient's last visit was on 07/21/2023. In the interim since the last visit he has completed Cycle 13 of cemiplimab  and a restaging PET CT.    On exam today Martin Curry reports he has been well overall in interim since our last visit.  He reports that he is ear does continue to leak fluid on and off, but not bright red and not consistent with blood.  He reports it can be mustard or rust colored on occasion.  It is not dripping on his shirt.  He reports it is definitely improved from 4 to 5 months ago.  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  He has to hold his lip to make sure he does not bite it.  He reports overall his energy is good and he is having no infectious symptoms such as fevers, chills, sweats.  He does not have any major summer plans as his wife is immobile.  Overall he is willing and able to proceed with immunotherapy treatment at this time.   MEDICAL HISTORY:  Past Medical History:  Diagnosis Date   Atypical nevus 09/07/2002   left abdomen-slight   BCC (basal cell carcinoma of skin) 07/19/2014   left sideburn- +margin-exc.   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 if orignated from  skin cancer   Nodular basal cell carcinoma (BCC) 11/21/2012   right side of scalp-CX35FU   Nodular basal cell carcinoma (BCC) 06/12/2014   left sideburn-CX35FU/exc.   Nodular basal cell carcinoma (BCC) 07/16/2015   left scalp-CX35FU   Polycythemia    Prostate cancer (HCC) 1990's   total prostatectomy and adjuvant radiation. Last PSA was 0.02 in 09/2011   SCC (squamous cell carcinoma) 04/03/2009   forehead-txpbx   SCC (squamous cell carcinoma) 07/16/2015   left front scalp   SCC (squamous cell carcinoma) 07/16/2015   left front scalp-Cx35FU   SCCA (squamous cell carcinoma) of skin 12/20/2019   Right forehead (in situ)   SCCA (squamous cell carcinoma) of skin 05/27/2020   Mid Parietal Scalp (in situ)   Superficial basal cell carcinoma (BCC) 11/21/2012   behind left ear-CX35FU, midback-CX35FU, right forehead-CX35FU-exc   Superficial basal cell carcinoma (BCC) 11/02/2017   left post neck-CX35FU   Superficial nodular basal cell carcinoma (BCC) 12/20/2019   Left temporal scalp   Superficial nodular basal cell carcinoma (BCC) 05/27/2020   Right Preauricular area (curet and 5FU)    SURGICAL HISTORY: Past Surgical History:  Procedure Laterality Date   ADJACENT TISSUE TRANSFER/TISSUE REARRANGEMENT N/A 05/22/2019   Procedure: COMPLEX CLOSURE OF NECK WOUND;  Surgeon: Barb Bonito, MD;  Location: [facility] OR;  Service: Plastics;  Laterality: N/A;   ALLOGRAFT APPLICATION N/A 05/22/2019   Procedure: POSSIBLE FACIAL NERVE RECONSTRUCTION WITH NERVE ALLOGRAFT VS AUTOGRAFT;  Surgeon: Barb Bonito, MD;  Location: [facility] OR;  Service: Plastics;  Laterality: N/A;   BLADDER SURGERY     due to prostatectomy.    CATARACT EXTRACTION     right   COLONOSCOPY     neg in the past; due in 2014 with [place] Dr. Adan Holms   EYE SURGERY     1950   HERNIA REPAIR     IR IMAGING GUIDED PORT INSERTION  11/30/2022   MOHS SURGERY     NECK DISSECTION  05/22/2019    NECK DISSECTION   PAROTIDECTOMY  05/22/2019    PAROTIDECTOMY with facial nerve dissection    PAROTIDECTOMY Right 05/22/2019   Procedure: PAROTIDECTOMY;  Surgeon: Janita Mellow, MD;  Location: [facility] OR;  Service: ENT;  Laterality: Right;   PROSTATE SURGERY     RADICAL NECK DISSECTION Right 05/22/2019   Procedure: RADICAL NECK DISSECTION;  Surgeon: Janita Mellow, MD;  Location: [facility] OR;  Service: ENT;  Laterality: Right;   TONSILLECTOMY      SOCIAL HISTORY: Social History   Socioeconomic History   Marital status: Married    Spouse name: Not on file   Number of children: 3   Years of education: Not on file   Highest education level: Not on file  Occupational History    Comment: retired Scientist, 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   Occupation: retired  Tobacco Use   Smoking status: Never   Smokeless tobacco: Never  Vaping Use   Vaping status: Never Used  Substance and Sexual Activity   Alcohol  use: No   Drug use: No   Sexual activity: Not on file  Other Topics Concern   Not on file  Social History Narrative   Not on file   Social Drivers of Health   Financial Resource Strain: Not on file  Food Insecurity: No Food Insecurity (10/29/2022)   Hunger Vital Sign    Worried About Running Out of Food in the Last Year: Never true    Ran Out of Food in the Last Year: Never true  Transportation Needs: No Transportation Needs (10/29/2022)   PRAPARE - Administrator, Civil Service (Medical): No    Lack of Transportation (Non-Medical): No  Physical Activity: Not on file  Stress: Not on file  Social Connections: Not on file  Intimate Partner Violence: Not At Risk (10/29/2022)   Humiliation, Afraid, Rape, and Kick questionnaire    Fear of Current or Ex-Partner: No    Emotionally Abused: No    Physically Abused: No    Sexually Abused: No    FAMILY HISTORY: Family History  Problem Relation Age of Onset   Uterine cancer Mother 27   Ovarian cancer Mother    Lung cancer Father 77   Prostate cancer Brother    Prostate cancer Brother     Colon cancer Neg Hx    Stomach cancer Neg Hx    Esophageal cancer Neg Hx    Rectal cancer Neg Hx     ALLERGIES:  is allergic to methylprednisolone, penicillins, latex, and tape.  MEDICATIONS:  Current Outpatient Medications  Medication Sig Dispense Refill   amLODipine  (NORVASC ) 5 MG tablet Take 5 mg by mouth daily.      aspirin  81 MG tablet Take 81 mg by mouth daily.     benazepril  (LOTENSIN ) 10 MG tablet Take 10 mg by mouth daily.     famotidine -calcium carbonate-magnesium hydroxide (PEPCID  COMPLETE) 10-800-165 MG chewable tablet Take one tablet PRN     levothyroxine  (SYNTHROID ) 25 MCG tablet Take 25 mcg by mouth daily before breakfast. 2 tABS M,W,F and one the other days     lidocaine -prilocaine  (EMLA ) cream Apply 1 Application topically as needed. 30 g 0   Multiple Vitamin (MULTIVITAMIN) tablet Take 1 tablet by mouth daily.     ondansetron  (ZOFRAN ) 8 MG tablet Take 1 tablet (8 mg total) by mouth every 8 (eight) hours as needed for nausea or vomiting. 20 tablet 1   potassium citrate  (UROCIT-K ) 10 MEQ (1080 MG) SR tablet Take 10 mEq by mouth 3 (three) times daily with meals.     simvastatin  (ZOCOR ) 20 MG tablet Take 20 mg by mouth every evening.     No current facility-administered medications for this visit.   Facility-Administered Medications Ordered in Other Visits  Medication Dose Route Frequency Provider Last Rate Last Admin   sodium chloride  flush (NS) 0.9 % injection 10 mL  10 mL Intracatheter PRN Dorrance Sellick T IV, MD   10 mL at 08/11/23 1015    REVIEW OF SYSTEMS:   Constitutional: ( - ) fevers, ( - )  chills , ( - ) night sweats Eyes: ( - ) blurriness of vision, ( - ) double vision, ( - ) watery eyes Ears, nose, mouth, throat, and face: ( - ) mucositis, ( - ) sore throat Respiratory: ( - ) cough, ( - ) dyspnea, ( - ) wheezes Cardiovascular: ( - ) palpitation, ( - ) chest discomfort, ( - ) lower extremity swelling  Gastrointestinal:  ( - ) nausea, ( - ) heartburn, ( - )  change in bowel habits Skin: ( - ) abnormal skin rashes Lymphatics: ( - ) new lymphadenopathy, ( - ) easy bruising Neurological: ( - ) numbness, ( - ) tingling, ( - ) new weaknesses Behavioral/Psych: ( - ) mood change, ( - ) new changes  All other systems were reviewed with the patient and are negative.  PHYSICAL EXAMINATION: ECOG PERFORMANCE STATUS: 1 - Symptomatic but completely ambulatory  Vitals:   08/11/23 0821  BP: 119/69  Pulse: 64  Resp: 14  Temp: 98.3 F (36.8 C)  SpO2: 97%     Filed Weights   08/11/23 0821  Weight: 185 lb 8 oz (84.1 kg)      GENERAL: Well-appearing elderly Caucasian male, alert, no distress and comfortable SKIN: skin color, texture, turgor are normal, no rashes or significant lesions EYES: conjunctiva are pink and non-injected, sclera clear LUNGS: clear to auscultation and percussion with normal breathing effort HEART: regular rate & rhythm and no murmurs and no lower extremity edema Musculoskeletal: no cyanosis of digits and no clubbing  PSYCH: alert & oriented x 3, fluent speech NEURO: no focal motor/sensory deficits  LABORATORY DATA:  I have reviewed the data as listed    Latest Ref Rng & Units 08/11/2023    7:57 AM 07/21/2023    9:48 AM 06/30/2023    9:43 AM  CBC  WBC 4.0 - 10.5 K/uL 6.3  7.2  6.6   Hemoglobin 13.0 - 17.0 g/dL 40.9  81.1  91.4   Hematocrit 39.0 - 52.0 % 43.5  43.0  43.1   Platelets 150 - 400 K/uL 204  248  229        Latest Ref Rng & Units 08/11/2023    7:57 AM 07/21/2023    9:48 AM 06/30/2023    9:43 AM  CMP  Glucose 70 - 99 mg/dL 782  956  213   BUN 8 - 23 mg/dL 14  22  17    Creatinine 0.61 - 1.24 mg/dL 0.86  5.78  4.69   Sodium 135 - 145 mmol/L 140  138  140   Potassium 3.5 - 5.1 mmol/L 4.1  4.1  4.0   Chloride 98 - 111 mmol/L 108  107  106   CO2 22 - 32 mmol/L 28  24  27    Calcium 8.9 - 10.3 mg/dL 9.8  9.8  9.8   Total Protein 6.5 - 8.1 g/dL 6.7  6.9  7.0   Total Bilirubin 0.0 - 1.2 mg/dL 0.5  0.8  0.6    Alkaline Phos 38 - 126 U/L 70  73  68   AST 15 - 41 U/L 20  25  20    ALT 0 - 44 U/L 18  26  23      Lab Results  Component Value Date   MPROTEIN Not Observed 09/27/2020   Lab Results  Component Value Date   KPAFRELGTCHN 25.6 (H) 09/27/2020   LAMBDASER 23.0 09/27/2020   KAPLAMBRATIO 1.11 09/27/2020    RADIOGRAPHIC STUDIES: No results found.    ASSESSMENT & PLAN CHAZ MCGLASSON is a 89 y.o. 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  At this time the patient's clinical findings are complicated.  He does have a known squamous cell carcinoma invasive to the pelvic bone and a known  basal cell carcinoma behind his right ear.  It is unclear if these 2 processes are the same or 2 separate primaries.  Additionally there is no clear evidence of a primary squamous cell carcinoma (however my concern is that the area behind the right ear may represent the same process).  Patient also does have PET avid areas of his bowels which I do not suspect are related, but we will have evaluated further with EGD/colonoscopy as needed.  At this time the best treatment course would be to treat with Cemiplimab  which should be effective at treating both basal cell carcinoma and squamous cell carcinoma, effectively treating both even if they are separate processes.  Overall he voices understanding of this complicated situation and our plan moving forward.  Of note radiation oncology and ENT are on board and willing to assist with management of his current findings.  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 Previously discussed with radiology who notes a repeat biopsy was not recommended and repeat imaging would be preferred.  --negative multiple myeloma labs with SPEP and  serum free light chains showing no evidence of monoclonal gammopathy.  --Given his prior history of prostate cancer we ordered a PSA, which was normal. Repeated PSA previously, found to be within normal limits --CT C/A/P shows no evidence of disease elsewhere in the body in August 2022. Most recent PET CT scan on 10/02/2022 showed hypermetabolic osseous metastasis with a dominant mass centered about the inferior medial portion of the right mastoid, suspicious for site of primary squamous cell carcinoma. -- PET CT scan shows evidence of lesion behind the right ear as well as the known pelvic metastasis. -- Reviewed tissue with pathology to determine if both lesions are the same pathology or different origins.  At this time findings are most consistent with a basal cell primary behind his ear and squamous cell carcinoma of the pelvis.  It is possible these at the same pathology with the basal/squamous crossover, though they may represent separate primaries.  Either way Cemiplimab  therapy should be adequate to treat both. -- PD-L1 testing showed 0% PD-L1 score, however the use of cemiplimab  does not require PD-L1 positivity (lack of PD-L1 prevents the use of pembrolizumab)  PLAN: --Proceed with cycle 14 day 1 of Cemiplimab  immunotherapy today --Labs today show white blood cell count 6.3, hemoglobin 15.0, MCV 90.6, platelets 204 --PET CT scan on 01/25/2023 showed response to therapy as evidenced by decrease in FDG avidity within a destructive soft tissue mass involving the right mastoid as well as within osseous metastases.  Also previously noted hypermetabolic cecal mass, highly worrisome for colon carcinoma (colonoscopy confirmed polyps with no active malignancy . Most recent PET CT scan on 07/30/2023 not yet read, though my review shows no overt progression of disease.  -- Return to clinic for next Cycle of cemiplimab  in 3 weeks time.   # Bleeding of the Ear--mild -- secondary due to erosion of the tumor  into his ear canal. -- wound has healed without evidence of bleeding.  --Minor orange color discharge from the ear, mild bleeding at this time -- monitor for now.   # FDG Avid Colon Lesion --Patient underwent colonoscopy with gastroenterology and found to have large polyps. -- No evidence of colonic malignancy.  No orders of the defined types were placed in this encounter.   All questions were answered. The patient knows to call  the clinic with any problems, questions or concerns.  A total of more than 30 minutes were spent on this encounter with face-to-face time and non-face-to-face time, including preparing to see the patient, ordering tests and/or medications, counseling the patient and coordination of care as outlined above.   Rogerio Clay, MD Department of Hematology/Oncology [facility] Phone: [phone] Pager: [phone] Email: [email or web address]   08/11/2023 10:55 AM

## 2023-08-11 NOTE — Patient Instructions (Signed)
 [facility] - A DEPT OF [facility]  Discharge Instructions: Thank you for choosing [facility] to provide your oncology and hematology care.   If you have a lab appointment with the Cancer Center, please go directly to the Cancer Center and check in at the registration area.   Wear comfortable clothing and clothing appropriate for easy access to any Portacath or PICC line.   We strive to give you quality time with your provider. You may need to reschedule your appointment if you arrive late (15 or more minutes).  Arriving late affects you and other patients whose appointments are after yours.  Also, if you miss three or more appointments without notifying the office, you may be dismissed from the clinic at the provider's discretion.      For prescription refill requests, have your pharmacy contact our office and allow 72 hours for refills to be completed.    Today you received the following chemotherapy and/or immunotherapy agents libtayo      To help prevent nausea and vomiting after your treatment, we encourage you to take your nausea medication as directed.  BELOW ARE SYMPTOMS THAT SHOULD BE REPORTED IMMEDIATELY: *FEVER GREATER THAN 100.4 F (38 C) OR HIGHER *CHILLS OR SWEATING *NAUSEA AND VOMITING THAT IS NOT CONTROLLED WITH YOUR NAUSEA MEDICATION *UNUSUAL SHORTNESS OF BREATH *UNUSUAL BRUISING OR BLEEDING *URINARY PROBLEMS (pain or burning when urinating, or frequent urination) *BOWEL PROBLEMS (unusual diarrhea, constipation, pain near the anus) TENDERNESS IN MOUTH AND THROAT WITH OR WITHOUT PRESENCE OF ULCERS (sore throat, sores in mouth, or a toothache) UNUSUAL RASH, SWELLING OR PAIN  UNUSUAL VAGINAL DISCHARGE OR ITCHING   Items with * indicate a potential emergency and should be followed up as soon as possible or go to the Emergency Department if any problems should occur.  Please show the CHEMOTHERAPY ALERT CARD or IMMUNOTHERAPY  ALERT CARD at check-in to the Emergency Department and triage nurse.  Should you have questions after your visit or need to cancel or reschedule your appointment, please contact [facility] - A DEPT OF [facility]  Dept: [phone]  and follow the prompts.  Office hours are 8:00 a.m. to 4:30 p.m. Monday - Friday. Please note that voicemails left after 4:00 p.m. may not be returned until the following business day.  We are closed weekends and major holidays. You have access to a nurse at all times for urgent questions. Please call the main number to the clinic Dept: [phone] and follow the prompts.   For any non-urgent questions, you may also contact your provider using MyChart. We now offer e-Visits for anyone 104 and older to request care online for non-urgent symptoms. For details visit [email or web address].   Also download the MyChart app! Go to the app store, search "MyChart", open the app, select [facility], and log in with your MyChart username and password.

## 2023-08-12 ENCOUNTER — Other Ambulatory Visit: Payer: Self-pay

## 2023-08-12 LAB — T4: T4, Total: 8 ug/dL (ref 4.5–12.0)

## 2023-08-19 ENCOUNTER — Other Ambulatory Visit: Payer: Self-pay

## 2023-09-02 ENCOUNTER — Inpatient Hospital Stay: Attending: Hematology and Oncology

## 2023-09-02 ENCOUNTER — Inpatient Hospital Stay

## 2023-09-02 ENCOUNTER — Inpatient Hospital Stay: Admitting: Hematology and Oncology

## 2023-09-02 VITALS — BP 133/72 | HR 61 | Temp 97.7°F | Resp 13 | Wt 183.3 lb

## 2023-09-02 DIAGNOSIS — C4441 Basal cell carcinoma of skin of scalp and neck: Secondary | ICD-10-CM | POA: Insufficient documentation

## 2023-09-02 DIAGNOSIS — C4492 Squamous cell carcinoma of skin, unspecified: Secondary | ICD-10-CM | POA: Diagnosis not present

## 2023-09-02 DIAGNOSIS — Z95828 Presence of other vascular implants and grafts: Secondary | ICD-10-CM

## 2023-09-02 DIAGNOSIS — C7951 Secondary malignant neoplasm of bone: Secondary | ICD-10-CM | POA: Diagnosis not present

## 2023-09-02 DIAGNOSIS — Z5112 Encounter for antineoplastic immunotherapy: Secondary | ICD-10-CM | POA: Insufficient documentation

## 2023-09-02 DIAGNOSIS — Z79620 Long term (current) use of immunosuppressive biologic: Secondary | ICD-10-CM | POA: Diagnosis not present

## 2023-09-02 LAB — CMP (CANCER CENTER ONLY)
ALT: 17 U/L (ref 0–44)
AST: 20 U/L (ref 15–41)
Albumin: 4 g/dL (ref 3.5–5.0)
Alkaline Phosphatase: 68 U/L (ref 38–126)
Anion gap: 4 — ABNORMAL LOW (ref 5–15)
BUN: 18 mg/dL (ref 8–23)
CO2: 28 mmol/L (ref 22–32)
Calcium: 9.6 mg/dL (ref 8.9–10.3)
Chloride: 109 mmol/L (ref 98–111)
Creatinine: 1.24 mg/dL (ref 0.61–1.24)
GFR, Estimated: 56 mL/min — ABNORMAL LOW (ref >60)
Glucose, Bld: 92 mg/dL (ref 70–99)
Potassium: 4.1 mmol/L (ref 3.5–5.1)
Sodium: 141 mmol/L (ref 135–145)
Total Bilirubin: 0.6 mg/dL (ref 0.0–1.2)
Total Protein: 6.7 g/dL (ref 6.5–8.1)

## 2023-09-02 LAB — CBC WITH DIFFERENTIAL (CANCER CENTER ONLY)
Abs Immature Granulocytes: 0.02 K/uL (ref 0.00–0.07)
Basophils Absolute: 0 K/uL (ref 0.0–0.1)
Basophils Relative: 0 %
Eosinophils Absolute: 0.2 K/uL (ref 0.0–0.5)
Eosinophils Relative: 3 %
HCT: 42.1 % (ref 39.0–52.0)
Hemoglobin: 14.6 g/dL (ref 13.0–17.0)
Immature Granulocytes: 0 %
Lymphocytes Relative: 16 %
Lymphs Abs: 1.1 K/uL (ref 0.7–4.0)
MCH: 31.1 pg (ref 26.0–34.0)
MCHC: 34.7 g/dL (ref 30.0–36.0)
MCV: 89.8 fL (ref 80.0–100.0)
Monocytes Absolute: 0.6 K/uL (ref 0.1–1.0)
Monocytes Relative: 9 %
Neutro Abs: 4.9 K/uL (ref 1.7–7.7)
Neutrophils Relative %: 72 %
Platelet Count: 228 K/uL (ref 150–400)
RBC: 4.69 MIL/uL (ref 4.22–5.81)
RDW: 13.4 % (ref 11.5–15.5)
WBC Count: 6.8 K/uL (ref 4.0–10.5)
nRBC: 0 % (ref 0.0–0.2)

## 2023-09-02 LAB — TSH: TSH: 2.94 u[IU]/mL (ref 0.350–4.500)

## 2023-09-02 MED ORDER — SODIUM CHLORIDE 0.9 % IV SOLN: Freq: Once | INTRAVENOUS | Status: AC

## 2023-09-02 MED ORDER — HEPARIN SOD (PORK) LOCK FLUSH 100 UNIT/ML IV SOLN
500.0000 [IU] | Freq: Once | INTRAVENOUS | Status: AC | PRN
Start: 2023-09-02 — End: 2023-09-02
  Administered 2023-09-02: 500 [IU]

## 2023-09-02 MED ORDER — SODIUM CHLORIDE 0.9% FLUSH: 10.0000 mL | INTRAVENOUS | Status: DC | PRN

## 2023-09-02 MED ORDER — SODIUM CHLORIDE 0.9 % IV SOLN
350.0000 mg | Freq: Once | INTRAVENOUS | Status: AC
  Administered 2023-09-02: 350 mg via INTRAVENOUS
  Filled 2023-09-02: qty 7

## 2023-09-02 MED ORDER — SODIUM CHLORIDE 0.9% FLUSH
10.0000 mL | INTRAVENOUS | Status: DC | PRN
  Administered 2023-09-02: 10 mL

## 2023-09-02 NOTE — Patient Instructions (Signed)
 [facility] - A DEPT OF [facility]  Discharge Instructions: Thank you for choosing [facility] to provide your oncology and hematology care.   If you have a lab appointment with the Cancer Center, please go directly to the Cancer Center and check in at the registration area.   Wear comfortable clothing and clothing appropriate for easy access to any Portacath or PICC line.   We strive to give you quality time with your provider. You may need to reschedule your appointment if you arrive late (15 or more minutes).  Arriving late affects you and other patients whose appointments are after yours.  Also, if you miss three or more appointments without notifying the office, you may be dismissed from the clinic at the provider's discretion.      For prescription refill requests, have your pharmacy contact our office and allow 72 hours for refills to be completed.    Today you received the following chemotherapy and/or immunotherapy agents: Libtayo.       To help prevent nausea and vomiting after your treatment, we encourage you to take your nausea medication as directed.  BELOW ARE SYMPTOMS THAT SHOULD BE REPORTED IMMEDIATELY: *FEVER GREATER THAN 100.4 F (38 C) OR HIGHER *CHILLS OR SWEATING *NAUSEA AND VOMITING THAT IS NOT CONTROLLED WITH YOUR NAUSEA MEDICATION *UNUSUAL SHORTNESS OF BREATH *UNUSUAL BRUISING OR BLEEDING *URINARY PROBLEMS (pain or burning when urinating, or frequent urination) *BOWEL PROBLEMS (unusual diarrhea, constipation, pain near the anus) TENDERNESS IN MOUTH AND THROAT WITH OR WITHOUT PRESENCE OF ULCERS (sore throat, sores in mouth, or a toothache) UNUSUAL RASH, SWELLING OR PAIN  UNUSUAL VAGINAL DISCHARGE OR ITCHING   Items with * indicate a potential emergency and should be followed up as soon as possible or go to the Emergency Department if any problems should occur.  Please show the CHEMOTHERAPY ALERT CARD or IMMUNOTHERAPY  ALERT CARD at check-in to the Emergency Department and triage nurse.  Should you have questions after your visit or need to cancel or reschedule your appointment, please contact [facility] - A DEPT OF [facility]  Dept: [phone]  and follow the prompts.  Office hours are 8:00 a.m. to 4:30 p.m. Monday - Friday. Please note that voicemails left after 4:00 p.m. may not be returned until the following business day.  We are closed weekends and major holidays. You have access to a nurse at all times for urgent questions. Please call the main number to the clinic Dept: [phone] and follow the prompts.   For any non-urgent questions, you may also contact your provider using MyChart. We now offer e-Visits for anyone 71 and older to request care online for non-urgent symptoms. For details visit [email or web address].   Also download the MyChart app! Go to the app store, search "MyChart", open the app, select [facility], and log in with your MyChart username and password.

## 2023-09-02 NOTE — Progress Notes (Signed)
 [facility] [phone]   [phone]  PROGRESS NOTE  Patient Care Team: Yolande Toribio MATSU, MD as PCP - General (Internal Medicine) Watt Rush, MD as Attending Physician (Urology) Izell Domino, MD as Consulting Physician (Radiation Oncology) Malmfelt, Delon CROME, RN as Oncology Nurse Navigator (Oncology) Lynnell Nottingham, MD as Consulting Physician (Dermatology) Federico Rosario BROCKS, MD as Consulting Physician (Gastroenterology) Federico Rush ONEIDA MADISON, MD as Consulting Physician (Hematology and Oncology)  Hematological/Oncological History # Bone Lesions on CT/MRI # 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 Additionally there was noted to be a new enhancing lesion at the tip of the right greater trochanter measuring up to 1.7 cm also suspicious for metastatic disease. 09/27/2020: establish care with Dr. Federico 10/29/2020: CT biopsy performed of left iliac bone lesion. Pathology showed bone and marrow without evidence of carcinoma.  11/11/2022: Cycle 1 Day 1 of cemplimab therapy.  12/03/2022: Cycle 2 Day 1 of cemplimab therapy.  12/23/2022: Cycle 3 Day 1 of cemplimab therapy.  01/13/2023: Cycle 4 Day 1 of cemplimab therapy.  02/03/2023: Cycle 5 Day 1 of cemplimab therapy.  02/23/2023: Cycle 6 Day 1 of cemplimab therapy.  03/16/2023:  Cycle 7 Day 1 of cemplimab therapy.  04/06/2023: Cycle 8 Day 1 of cemplimab therapy.  04/28/2023: Cycle 9 Day 1 of cemplimab therapy.  05/18/2023: Cycle 10 Day 1 of cemplimab therapy.  06/08/2023: Cycle 11 Day 1 of cemplimab therapy.  06/30/2023: Cycle 12 Day 1 of cemplimab therapy.  07/21/2023: Cycle 13 Day 1 of cemplimab  therapy.   Interval History:  Martin Curry 89 y.o. 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 The patient's last visit was on 08/11/2023. In the interim since the last visit he has completed Cycle 14 of cemiplimab  and a restaging PET CT.    On exam today [name] reports he has been well overall in interim since her last visit.  He reports he ate hotdogs for 4 July and could hear the fireworks.  He reports overall he is been feeling good.  He is not having any bone or back pain.  He reports he does continue to have some drainage from his ear which is on and off.  He notes that he does have his ups and downs.  He does continue to have trouble with the nerves in his lip with chewing but it is a minor hindrance.  He reports his weight has been steady and his appetite remains good.  He is doing his best to try to exercise 3 times per week.  Otherwise he denies any fevers, chills, sweats, nausea, vomiting or diarrhea.  A full 10 point ROS is otherwise negative.   MEDICAL HISTORY:  Past Medical History:  Diagnosis Date   Atypical nevus 09/07/2002   left abdomen-slight   BCC (basal cell carcinoma of skin) 07/19/2014   left sideburn- +margin-exc.   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 if orignated from skin cancer   Nodular basal cell carcinoma (BCC) 11/21/2012   right side of scalp-CX35FU    Nodular basal cell carcinoma (BCC) 06/12/2014   left sideburn-CX35FU/exc.   Nodular basal cell carcinoma (BCC) 07/16/2015   left scalp-CX35FU   Polycythemia    Prostate cancer (HCC) 1990's   total prostatectomy and adjuvant radiation. Last PSA was 0.02 in 09/2011   SCC (squamous cell carcinoma) 04/03/2009   forehead-txpbx   SCC (squamous cell carcinoma) 07/16/2015   left front scalp   SCC (squamous cell carcinoma) 07/16/2015   left front scalp-Cx35FU   SCCA (squamous cell carcinoma) of skin 12/20/2019   Right forehead (in situ)   SCCA (squamous cell carcinoma) of skin 05/27/2020   Mid Parietal Scalp (in situ)   Superficial basal cell carcinoma (BCC) 11/21/2012   behind left ear-CX35FU, midback-CX35FU, right forehead-CX35FU-exc   Superficial basal cell carcinoma (BCC) 11/02/2017   left post neck-CX35FU   Superficial nodular basal cell carcinoma (BCC) 12/20/2019   Left temporal scalp   Superficial nodular basal cell carcinoma (BCC) 05/27/2020   Right Preauricular area (curet and 5FU)    SURGICAL HISTORY: Past Surgical History:  Procedure Laterality Date   ADJACENT TISSUE TRANSFER/TISSUE REARRANGEMENT N/A 05/22/2019   Procedure: COMPLEX CLOSURE OF NECK WOUND;  Surgeon: Elisabeth Craig RAMAN, MD;  Location: [facility] OR;  Service: Plastics;  Laterality: N/A;   ALLOGRAFT APPLICATION N/A 05/22/2019   Procedure: POSSIBLE FACIAL NERVE RECONSTRUCTION WITH NERVE ALLOGRAFT VS AUTOGRAFT;  Surgeon: Elisabeth Craig RAMAN, MD;  Location: [facility] OR;  Service: Plastics;  Laterality: N/A;   BLADDER SURGERY     due to prostatectomy.    CATARACT EXTRACTION     right   COLONOSCOPY     neg in the past; due in 2014 with [place] Dr. Jakie   EYE SURGERY     1950   HERNIA REPAIR     IR IMAGING GUIDED PORT INSERTION  11/30/2022   MOHS SURGERY     NECK DISSECTION  05/22/2019    NECK DISSECTION   PAROTIDECTOMY  05/22/2019   PAROTIDECTOMY with facial nerve dissection    PAROTIDECTOMY Right 05/22/2019   Procedure:  PAROTIDECTOMY;  Surgeon: Jesus Oliphant, MD;  Location: [facility] OR;  Service: ENT;  Laterality: Right;   PROSTATE SURGERY     RADICAL NECK DISSECTION Right 05/22/2019   Procedure: RADICAL NECK DISSECTION;  Surgeon: Jesus Oliphant, MD;  Location: [facility] OR;  Service: ENT;  Laterality: Right;   TONSILLECTOMY      SOCIAL HISTORY: Social History   Socioeconomic History   Marital status: Married    Spouse name: Not on file   Number of children: 3   Years of education: Not on file   Highest education level: Not on file  Occupational History    Comment: retired Scientist, [employer]; 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   Occupation: retired  Tobacco Use   Smoking status: Never   Smokeless tobacco: Never  Vaping Use   Vaping status: Never Used  Substance and Sexual Activity   Alcohol  use: No   Drug use: No   Sexual activity: Not on  file  Other Topics Concern   Not on file  Social History Narrative   Not on file   Social Drivers of Health   Financial Resource Strain: Not on file  Food Insecurity: No Food Insecurity (10/29/2022)   Hunger Vital Sign    Worried About Running Out of Food in the Last Year: Never true    Ran Out of Food in the Last Year: Never true  Transportation Needs: No Transportation Needs (10/29/2022)   PRAPARE - Administrator, Civil Service (Medical): No    Lack of Transportation (Non-Medical): No  Physical Activity: Not on file  Stress: Not on file  Social Connections: Not on file  Intimate Partner Violence: Not At Risk (10/29/2022)   Humiliation, Afraid, Rape, and Kick questionnaire    Fear of Current or Ex-Partner: No    Emotionally Abused: No    Physically Abused: No    Sexually Abused: No    FAMILY HISTORY: Family History  Problem Relation Age of Onset   Uterine cancer Mother 67   Ovarian cancer Mother    Lung cancer Father 93   Prostate cancer Brother    Prostate cancer Brother    Colon cancer Neg Hx    Stomach cancer Neg Hx    Esophageal cancer Neg Hx    Rectal  cancer Neg Hx     ALLERGIES:  is allergic to methylprednisolone, penicillins, latex, and tape.  MEDICATIONS:  Current Outpatient Medications  Medication Sig Dispense Refill   amLODipine  (NORVASC ) 5 MG tablet Take 5 mg by mouth daily.      aspirin  81 MG tablet Take 81 mg by mouth daily.     benazepril  (LOTENSIN ) 10 MG tablet Take 10 mg by mouth daily.     famotidine -calcium carbonate-magnesium hydroxide (PEPCID  COMPLETE) 10-800-165 MG chewable tablet Take one tablet PRN     levothyroxine  (SYNTHROID ) 25 MCG tablet Take 25 mcg by mouth daily before breakfast. 2 tABS M,W,F and one the other days     lidocaine -prilocaine  (EMLA ) cream Apply 1 Application topically as needed. 30 g 0   Multiple Vitamin (MULTIVITAMIN) tablet Take 1 tablet by mouth daily.     ondansetron  (ZOFRAN ) 8 MG tablet Take 1 tablet (8 mg total) by mouth every 8 (eight) hours as needed for nausea or vomiting. 20 tablet 1   potassium citrate  (UROCIT-K ) 10 MEQ (1080 MG) SR tablet Take 10 mEq by mouth 3 (three) times daily with meals.     simvastatin  (ZOCOR ) 20 MG tablet Take 20 mg by mouth every evening.     No current facility-administered medications for this visit.   Facility-Administered Medications Ordered in Other Visits  Medication Dose Route Frequency Provider Last Rate Last Admin   cemiplimab -rwlc (LIBTAYO ) 350 mg in sodium chloride  0.9 % 100 mL chemo infusion  350 mg Intravenous Once Selyna Klahn T IV, MD       heparin  lock flush 100 unit/mL  500 Units Intracatheter Once PRN Kutler Vanvranken T IV, MD       sodium chloride  flush (NS) 0.9 % injection 10 mL  10 mL Intracatheter PRN Vincenzina Jagoda T IV, MD        REVIEW OF SYSTEMS:   Constitutional: ( - ) fevers, ( - )  chills , ( - ) night sweats Eyes: ( - ) blurriness of vision, ( - ) double vision, ( - ) watery eyes Ears, nose, mouth, throat, and face: ( - ) mucositis, ( - ) sore throat Respiratory: ( - )  cough, ( - ) dyspnea, ( - ) wheezes Cardiovascular: ( - )  palpitation, ( - ) chest discomfort, ( - ) lower extremity swelling Gastrointestinal:  ( - ) nausea, ( - ) heartburn, ( - ) change in bowel habits Skin: ( - ) abnormal skin rashes Lymphatics: ( - ) new lymphadenopathy, ( - ) easy bruising Neurological: ( - ) numbness, ( - ) tingling, ( - ) new weaknesses Behavioral/Psych: ( - ) mood change, ( - ) new changes  All other systems were reviewed with the patient and are negative.  PHYSICAL EXAMINATION: ECOG PERFORMANCE STATUS: 1 - Symptomatic but completely ambulatory  Vitals:   09/02/23 1044  BP: 133/72  Pulse: 61  Resp: 13  Temp: 97.7 F (36.5 C)  SpO2: 96%      Filed Weights   09/02/23 1044  Weight: 183 lb 4.8 oz (83.1 kg)       GENERAL: Well-appearing elderly Caucasian male, alert, no distress and comfortable SKIN: skin color, texture, turgor are normal, no rashes or significant lesions EYES: conjunctiva are pink and non-injected, sclera clear LUNGS: clear to auscultation and percussion with normal breathing effort HEART: regular rate & rhythm and no murmurs and no lower extremity edema Musculoskeletal: no cyanosis of digits and no clubbing  PSYCH: alert & oriented x 3, fluent speech NEURO: no focal motor/sensory deficits  LABORATORY DATA:  I have reviewed the data as listed    Latest Ref Rng & Units 09/02/2023   10:06 AM 08/11/2023    7:57 AM 07/21/2023    9:48 AM  CBC  WBC 4.0 - 10.5 K/uL 6.8  6.3  7.2   Hemoglobin 13.0 - 17.0 g/dL 85.3  84.9  84.8   Hematocrit 39.0 - 52.0 % 42.1  43.5  43.0   Platelets 150 - 400 K/uL 228  204  248        Latest Ref Rng & Units 09/02/2023   10:06 AM 08/11/2023    7:57 AM 07/21/2023    9:48 AM  CMP  Glucose 70 - 99 mg/dL 92  888  895   BUN 8 - 23 mg/dL 18  14  22    Creatinine 0.61 - 1.24 mg/dL 8.75  8.70  8.65   Sodium 135 - 145 mmol/L 141  140  138   Potassium 3.5 - 5.1 mmol/L 4.1  4.1  4.1   Chloride 98 - 111 mmol/L 109  108  107   CO2 22 - 32 mmol/L 28  28  24    Calcium  8.9 - 10.3 mg/dL 9.6  9.8  9.8   Total Protein 6.5 - 8.1 g/dL 6.7  6.7  6.9   Total Bilirubin 0.0 - 1.2 mg/dL 0.6  0.5  0.8   Alkaline Phos 38 - 126 U/L 68  70  73   AST 15 - 41 U/L 20  20  25    ALT 0 - 44 U/L 17  18  26      Lab Results  Component Value Date   MPROTEIN Not Observed 09/27/2020   Lab Results  Component Value Date   KPAFRELGTCHN 25.6 (H) 09/27/2020   LAMBDASER 23.0 09/27/2020   KAPLAMBRATIO 1.11 09/27/2020    RADIOGRAPHIC STUDIES: No results found.    ASSESSMENT & PLAN RUMI TARAS is a 89 y.o. 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  At  this time the patient's clinical findings are complicated.  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  It is unclear if these 2 processes are the same or 2 separate primaries.  Additionally there is no clear evidence of a primary squamous cell carcinoma (however my concern is that the area behind the right ear may represent the same process).  Patient also does have PET avid areas of his bowels which I do not suspect are related, but we will have evaluated further with EGD/colonoscopy as needed.  At this time the best treatment course would be to treat with Cemiplimab  which should be effective at treating both basal cell carcinoma and squamous cell carcinoma, effectively treating both even if they are separate processes.  Overall he voices understanding of this complicated situation and our plan moving forward.  Of note radiation oncology and ENT are on board and willing to assist with management of his current findings.  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 Previously discussed with radiology who notes a  repeat biopsy was not recommended and repeat imaging would be preferred.  --negative multiple myeloma labs with SPEP and serum free light chains showing no evidence of monoclonal gammopathy.  --Given his prior history of prostate cancer we ordered a PSA, which was normal. Repeated PSA previously, found to be within normal limits --CT C/A/P shows no evidence of disease elsewhere in the body in August 2022. Most recent PET CT scan on 10/02/2022 showed hypermetabolic osseous metastasis with a dominant mass centered about the inferior medial portion of the right mastoid, suspicious for site of primary squamous cell carcinoma. -- PET CT scan shows evidence of lesion behind the right ear as well as the known pelvic metastasis. -- Reviewed tissue with pathology to determine if both lesions are the same pathology or different origins.  At this time findings are most consistent with a basal cell primary behind his ear and squamous cell carcinoma of the pelvis.  It is possible these at the same pathology with the basal/squamous crossover, though they may represent separate primaries.  Either way Cemiplimab  therapy should be adequate to treat both. -- PD-L1 testing showed 0% PD-L1 score, however the use of cemiplimab  does not require PD-L1 positivity (lack of PD-L1 prevents the use of pembrolizumab)  PLAN: --Proceed with cycle 15 day 1 of Cemiplimab  immunotherapy today --Labs today show white blood cell count 6.8, Hgb 14.6, MCV 89.8, Plt 228  --PET CT scan on 07/30/2023 showed stable disease with a mild increase in FDG avidity in hip lesion. Next scan in Sept 2025.  -- Return to clinic for next Cycle of cemiplimab  in 3 weeks time.   # Bleeding of the Ear--mild -- secondary due to erosion of the tumor into his ear canal. -- wound has healed without evidence of bleeding.  --Minor orange color discharge from the ear, mild bleeding at this time -- monitor for now.   # FDG Avid Colon Lesion --Patient underwent  colonoscopy with gastroenterology and found to have large polyps. -- No evidence of colonic malignancy.  No orders of the defined types were placed in this encounter.   All questions were answered. The patient knows to call the clinic with any problems, questions or concerns.  A total of more than 30 minutes were spent on this encounter with  face-to-face time and non-face-to-face time, including preparing to see the patient, ordering tests and/or medications, counseling the patient and coordination of care as outlined above.   Norleen IVAR Kidney, MD Department of Hematology/Oncology [facility] Phone: [phone] Pager: [phone] Email: [email or web address]   09/02/2023 11:02 AM

## 2023-09-03 LAB — T4: T4, Total: 7.6 ug/dL (ref 4.5–12.0)

## 2023-09-21 ENCOUNTER — Other Ambulatory Visit: Payer: Self-pay

## 2023-09-23 ENCOUNTER — Inpatient Hospital Stay: Admitting: Hematology and Oncology

## 2023-09-23 ENCOUNTER — Inpatient Hospital Stay

## 2023-09-23 VITALS — BP 130/69 | HR 64 | Temp 98.1°F | Resp 13 | Wt 183.2 lb

## 2023-09-23 DIAGNOSIS — C4492 Squamous cell carcinoma of skin, unspecified: Secondary | ICD-10-CM

## 2023-09-23 DIAGNOSIS — C4441 Basal cell carcinoma of skin of scalp and neck: Secondary | ICD-10-CM

## 2023-09-23 DIAGNOSIS — C7951 Secondary malignant neoplasm of bone: Secondary | ICD-10-CM

## 2023-09-23 DIAGNOSIS — Z95828 Presence of other vascular implants and grafts: Secondary | ICD-10-CM

## 2023-09-23 DIAGNOSIS — Z5112 Encounter for antineoplastic immunotherapy: Secondary | ICD-10-CM

## 2023-09-23 DIAGNOSIS — Z79620 Long term (current) use of immunosuppressive biologic: Secondary | ICD-10-CM | POA: Diagnosis not present

## 2023-09-23 LAB — CMP (CANCER CENTER ONLY)
ALT: 22 U/L (ref 0–44)
AST: 22 U/L (ref 15–41)
Albumin: 4 g/dL (ref 3.5–5.0)
Alkaline Phosphatase: 66 U/L (ref 38–126)
Anion gap: 5 (ref 5–15)
BUN: 18 mg/dL (ref 8–23)
CO2: 28 mmol/L (ref 22–32)
Calcium: 9.5 mg/dL (ref 8.9–10.3)
Chloride: 108 mmol/L (ref 98–111)
Creatinine: 1.34 mg/dL — ABNORMAL HIGH (ref 0.61–1.24)
GFR, Estimated: 51 mL/min — ABNORMAL LOW (ref >60)
Glucose, Bld: 121 mg/dL — ABNORMAL HIGH (ref 70–99)
Potassium: 4 mmol/L (ref 3.5–5.1)
Sodium: 141 mmol/L (ref 135–145)
Total Bilirubin: 0.6 mg/dL (ref 0.0–1.2)
Total Protein: 6.6 g/dL (ref 6.5–8.1)

## 2023-09-23 LAB — CBC WITH DIFFERENTIAL (CANCER CENTER ONLY)
Abs Immature Granulocytes: 0.03 K/uL (ref 0.00–0.07)
Basophils Absolute: 0 K/uL (ref 0.0–0.1)
Basophils Relative: 0 %
Eosinophils Absolute: 0.2 K/uL (ref 0.0–0.5)
Eosinophils Relative: 2 %
HCT: 42.5 % (ref 39.0–52.0)
Hemoglobin: 14.8 g/dL (ref 13.0–17.0)
Immature Granulocytes: 0 %
Lymphocytes Relative: 16 %
Lymphs Abs: 1.1 K/uL (ref 0.7–4.0)
MCH: 31.5 pg (ref 26.0–34.0)
MCHC: 34.8 g/dL (ref 30.0–36.0)
MCV: 90.4 fL (ref 80.0–100.0)
Monocytes Absolute: 0.6 K/uL (ref 0.1–1.0)
Monocytes Relative: 9 %
Neutro Abs: 4.8 K/uL (ref 1.7–7.7)
Neutrophils Relative %: 73 %
Platelet Count: 218 K/uL (ref 150–400)
RBC: 4.7 MIL/uL (ref 4.22–5.81)
RDW: 13.3 % (ref 11.5–15.5)
WBC Count: 6.7 K/uL (ref 4.0–10.5)
nRBC: 0 % (ref 0.0–0.2)

## 2023-09-23 LAB — TSH: TSH: 2.94 u[IU]/mL (ref 0.350–4.500)

## 2023-09-23 MED ORDER — SODIUM CHLORIDE 0.9% FLUSH
10.0000 mL | INTRAVENOUS | Status: DC | PRN
  Administered 2023-09-23: 10 mL

## 2023-09-23 MED ORDER — SODIUM CHLORIDE 0.9 % IV SOLN
Freq: Once | INTRAVENOUS | Status: AC
Start: 2023-09-23 — End: 2023-09-23

## 2023-09-23 MED ORDER — HEPARIN SOD (PORK) LOCK FLUSH 100 UNIT/ML IV SOLN
500.0000 [IU] | Freq: Once | INTRAVENOUS | Status: AC | PRN
Start: 2023-09-23 — End: 2023-09-23
  Administered 2023-09-23: 500 [IU]

## 2023-09-23 MED ORDER — SODIUM CHLORIDE 0.9 % IV SOLN
350.0000 mg | Freq: Once | INTRAVENOUS | Status: AC
  Administered 2023-09-23: 350 mg via INTRAVENOUS
  Filled 2023-09-23: qty 7

## 2023-09-23 NOTE — Progress Notes (Signed)
 [facility] [phone]   [phone]  PROGRESS NOTE  Patient Care Team: Yolande Toribio MATSU, MD as PCP - General (Internal Medicine) Watt Rush, MD as Attending Physician (Urology) Izell Domino, MD as Consulting Physician (Radiation Oncology) Malmfelt, Delon CROME, RN as Oncology Nurse Navigator (Oncology) Lynnell Nottingham, MD as Consulting Physician (Dermatology) Federico Rosario BROCKS, MD as Consulting Physician (Gastroenterology) Federico Rush ONEIDA MADISON, MD as Consulting Physician (Hematology and Oncology)  Hematological/Oncological History # Bone Lesions on CT/MRI # 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 Additionally there was noted to be a new enhancing lesion at the tip of the right greater trochanter measuring up to 1.7 cm also suspicious for metastatic disease. 09/27/2020: establish care with Dr. Federico 10/29/2020: CT biopsy performed of left iliac bone lesion. Pathology showed bone and marrow without evidence of carcinoma.  11/11/2022: Cycle 1 Day 1 of cemplimab therapy.  12/03/2022: Cycle 2 Day 1 of cemplimab therapy.  12/23/2022: Cycle 3 Day 1 of cemplimab therapy.  01/13/2023: Cycle 4 Day 1 of cemplimab therapy.  02/03/2023: Cycle 5 Day 1 of cemplimab therapy.  02/23/2023: Cycle 6 Day 1 of cemplimab therapy.  03/16/2023:  Cycle 7 Day 1 of cemplimab therapy.  04/06/2023: Cycle 8 Day 1 of cemplimab therapy.  04/28/2023: Cycle 9 Day 1 of cemplimab therapy.  05/18/2023: Cycle 10 Day 1 of cemplimab therapy.  06/08/2023: Cycle 11 Day 1 of cemplimab therapy.  06/30/2023: Cycle 12 Day 1 of cemplimab therapy.  07/21/2023: Cycle 13 Day 1 of cemplimab  therapy.   Interval History:  Martin Curry 89 y.o. 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 The patient's last visit was on 09/02/2023. In the interim since the last visit he has completed Cycle 15 of cemiplimab .  On exam today Martin Curry reports he is doing his best to try to stay cool and hydrated.  He reports he feels the right and has no complaints.  He reports he does have fatigue here and there but overall his energy is good.  His appetite remains excellent but he continues to have difficulty with numbness in his lip when he is trying to eat something like a hamburger.  He reports that he does still have occasional drainage from his right ear but tends to be yellow in color and modest.  He reports he is not currently having any pain anywhere.  Other than fatigue he feels quite well.  He denies any fevers, chills, sweats, nausea, vomiting or diarrhea.  Overall he is willing and able to continue on immunotherapy at this time.  A full 10 point ROS is otherwise negative.   MEDICAL HISTORY:  Past Medical History:  Diagnosis Date   Atypical nevus 09/07/2002   left abdomen-slight   BCC (basal cell carcinoma of skin) 07/19/2014   left sideburn- +margin-exc.   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 if orignated from skin cancer   Nodular basal cell carcinoma (BCC) 11/21/2012   right side of  scalp-CX35FU   Nodular basal cell carcinoma (BCC) 06/12/2014   left sideburn-CX35FU/exc.   Nodular basal cell carcinoma (BCC) 07/16/2015   left scalp-CX35FU   Polycythemia    Prostate cancer (HCC) 1990's   total prostatectomy and adjuvant radiation. Last PSA was 0.02 in 09/2011   SCC (squamous cell carcinoma) 04/03/2009   forehead-txpbx   SCC (squamous cell carcinoma) 07/16/2015   left front scalp   SCC (squamous cell carcinoma) 07/16/2015   left front scalp-Cx35FU   SCCA (squamous cell carcinoma) of skin 12/20/2019   Right forehead (in situ)   SCCA (squamous cell carcinoma) of skin 05/27/2020   Mid Parietal Scalp (in situ)   Superficial basal cell carcinoma (BCC) 11/21/2012   behind left ear-CX35FU, midback-CX35FU, right forehead-CX35FU-exc   Superficial basal cell carcinoma (BCC) 11/02/2017   left post neck-CX35FU   Superficial nodular basal cell carcinoma (BCC) 12/20/2019   Left temporal scalp   Superficial nodular basal cell carcinoma (BCC) 05/27/2020   Right Preauricular area (curet and 5FU)    SURGICAL HISTORY: Past Surgical History:  Procedure Laterality Date   ADJACENT TISSUE TRANSFER/TISSUE REARRANGEMENT N/A 05/22/2019   Procedure: COMPLEX CLOSURE OF NECK WOUND;  Surgeon: Elisabeth Craig RAMAN, MD;  Location: [facility] OR;  Service: Plastics;  Laterality: N/A;   ALLOGRAFT APPLICATION N/A 05/22/2019   Procedure: POSSIBLE FACIAL NERVE RECONSTRUCTION WITH NERVE ALLOGRAFT VS AUTOGRAFT;  Surgeon: Elisabeth Craig RAMAN, MD;  Location: [facility] OR;  Service: Plastics;  Laterality: N/A;   BLADDER SURGERY     due to prostatectomy.    CATARACT EXTRACTION     right   COLONOSCOPY     neg in the past; due in 2014 with [place] [facility]. Jakie   EYE SURGERY     1950   HERNIA REPAIR     IR IMAGING GUIDED PORT INSERTION  11/30/2022   MOHS SURGERY     NECK DISSECTION  05/22/2019    NECK DISSECTION   PAROTIDECTOMY  05/22/2019   PAROTIDECTOMY with facial nerve dissection    PAROTIDECTOMY Right 05/22/2019    Procedure: PAROTIDECTOMY;  Surgeon: Jesus Oliphant, MD;  Location: [facility] OR;  Service: ENT;  Laterality: Right;   PROSTATE SURGERY     RADICAL NECK DISSECTION Right 05/22/2019   Procedure: RADICAL NECK DISSECTION;  Surgeon: Jesus Oliphant, MD;  Location: [facility] OR;  Service: ENT;  Laterality: Right;   TONSILLECTOMY      SOCIAL HISTORY: Social History   Socioeconomic History   Marital status: Married    Spouse name: Not on file   Number of children: 3   Years of education: Not on file   Highest education level: Not on file  Occupational History    Comment: retired Scientist, [employer]; 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   Occupation: retired  Tobacco Use   Smoking status: Never   Smokeless tobacco: Never  Vaping Use   Vaping status: Never Used  Substance and Sexual Activity   Alcohol  use: No   Drug use: No   Sexual activity: Not on file  Other Topics Concern  Not on file  Social History Narrative   Not on file   Social Drivers of Health   Financial Resource Strain: Not on file  Food Insecurity: No Food Insecurity (10/29/2022)   Hunger Vital Sign    Worried About Running Out of Food in the Last Year: Never true    Ran Out of Food in the Last Year: Never true  Transportation Needs: No Transportation Needs (10/29/2022)   PRAPARE - Administrator, Civil Service (Medical): No    Lack of Transportation (Non-Medical): No  Physical Activity: Not on file  Stress: Not on file  Social Connections: Not on file  Intimate Partner Violence: Not At Risk (10/29/2022)   Humiliation, Afraid, Rape, and Kick questionnaire    Fear of Current or Ex-Partner: No    Emotionally Abused: No    Physically Abused: No    Sexually Abused: No    FAMILY HISTORY: Family History  Problem Relation Age of Onset   Uterine cancer Mother 54   Ovarian cancer Mother    Lung cancer Father 31   Prostate cancer Brother    Prostate cancer Brother    Colon cancer Neg Hx    Stomach cancer Neg Hx    Esophageal cancer Neg Hx     Rectal cancer Neg Hx     ALLERGIES:  is allergic to methylprednisolone, penicillins, latex, and tape.  MEDICATIONS:  Current Outpatient Medications  Medication Sig Dispense Refill   amLODipine  (NORVASC ) 5 MG tablet Take 5 mg by mouth daily.      aspirin  81 MG tablet Take 81 mg by mouth daily.     benazepril  (LOTENSIN ) 10 MG tablet Take 10 mg by mouth daily.     famotidine -calcium carbonate-magnesium hydroxide (PEPCID  COMPLETE) 10-800-165 MG chewable tablet Take one tablet PRN     levothyroxine  (SYNTHROID ) 25 MCG tablet Take 25 mcg by mouth daily before breakfast. 2 tABS M,W,F and one the other days     lidocaine -prilocaine  (EMLA ) cream Apply 1 Application topically as needed. 30 g 0   Multiple Vitamin (MULTIVITAMIN) tablet Take 1 tablet by mouth daily.     ondansetron  (ZOFRAN ) 8 MG tablet Take 1 tablet (8 mg total) by mouth every 8 (eight) hours as needed for nausea or vomiting. 20 tablet 1   potassium citrate  (UROCIT-K ) 10 MEQ (1080 MG) SR tablet Take 10 mEq by mouth 3 (three) times daily with meals.     simvastatin  (ZOCOR ) 20 MG tablet Take 20 mg by mouth every evening.     No current facility-administered medications for this visit.   Facility-Administered Medications Ordered in Other Visits  Medication Dose Route Frequency Provider Last Rate Last Admin   cemiplimab -rwlc (LIBTAYO ) 350 mg in sodium chloride  0.9 % 100 mL chemo infusion  350 mg Intravenous Once Oniyah Rohe T IV, MD       heparin  lock flush 100 unit/mL  500 Units Intracatheter Once PRN Cherilynn Schomburg T IV, MD       sodium chloride  flush (NS) 0.9 % injection 10 mL  10 mL Intracatheter PRN Annalise Mcdiarmid T IV, MD        REVIEW OF SYSTEMS:   Constitutional: ( - ) fevers, ( - )  chills , ( - ) night sweats Eyes: ( - ) blurriness of vision, ( - ) double vision, ( - ) watery eyes Ears, nose, mouth, throat, and face: ( - ) mucositis, ( - ) sore throat Respiratory: ( - ) cough, ( - )  dyspnea, ( - ) wheezes Cardiovascular: ( -  ) palpitation, ( - ) chest discomfort, ( - ) lower extremity swelling Gastrointestinal:  ( - ) nausea, ( - ) heartburn, ( - ) change in bowel habits Skin: ( - ) abnormal skin rashes Lymphatics: ( - ) new lymphadenopathy, ( - ) easy bruising Neurological: ( - ) numbness, ( - ) tingling, ( - ) new weaknesses Behavioral/Psych: ( - ) mood change, ( - ) new changes  All other systems were reviewed with the patient and are negative.  PHYSICAL EXAMINATION: ECOG PERFORMANCE STATUS: 1 - Symptomatic but completely ambulatory  Vitals:   09/23/23 0945  BP: 130/69  Pulse: 64  Resp: 13  Temp: 98.1 F (36.7 C)  SpO2: 98%       Filed Weights   09/23/23 0945  Weight: 183 lb 3.2 oz (83.1 kg)        GENERAL: Well-appearing elderly Caucasian male, alert, no distress and comfortable SKIN: skin color, texture, turgor are normal, no rashes or significant lesions EYES: conjunctiva are pink and non-injected, sclera clear LUNGS: clear to auscultation and percussion with normal breathing effort HEART: regular rate & rhythm and no murmurs and no lower extremity edema Musculoskeletal: no cyanosis of digits and no clubbing  PSYCH: alert & oriented x 3, fluent speech NEURO: no focal motor/sensory deficits  LABORATORY DATA:  I have reviewed the data as listed    Latest Ref Rng & Units 09/23/2023    9:33 AM 09/02/2023   10:06 AM 08/11/2023    7:57 AM  CBC  WBC 4.0 - 10.5 K/uL 6.7  6.8  6.3   Hemoglobin 13.0 - 17.0 g/dL 85.1  85.3  84.9   Hematocrit 39.0 - 52.0 % 42.5  42.1  43.5   Platelets 150 - 400 K/uL 218  228  204        Latest Ref Rng & Units 09/23/2023    9:33 AM 09/02/2023   10:06 AM 08/11/2023    7:57 AM  CMP  Glucose 70 - 99 mg/dL 878  92  888   BUN 8 - 23 mg/dL 18  18  14    Creatinine 0.61 - 1.24 mg/dL 8.65  8.75  8.70   Sodium 135 - 145 mmol/L 141  141  140   Potassium 3.5 - 5.1 mmol/L 4.0  4.1  4.1   Chloride 98 - 111 mmol/L 108  109  108   CO2 22 - 32 mmol/L 28  28  28     Calcium 8.9 - 10.3 mg/dL 9.5  9.6  9.8   Total Protein 6.5 - 8.1 g/dL 6.6  6.7  6.7   Total Bilirubin 0.0 - 1.2 mg/dL 0.6  0.6  0.5   Alkaline Phos 38 - 126 U/L 66  68  70   AST 15 - 41 U/L 22  20  20    ALT 0 - 44 U/L 22  17  18      Lab Results  Component Value Date   MPROTEIN Not Observed 09/27/2020   Lab Results  Component Value Date   KPAFRELGTCHN 25.6 (H) 09/27/2020   LAMBDASER 23.0 09/27/2020   KAPLAMBRATIO 1.11 09/27/2020    RADIOGRAPHIC STUDIES: No results found.    ASSESSMENT & PLAN NIZAR CUTLER is a 89 y.o. 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  At this time  the patient's clinical findings are complicated.  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  It is unclear if these 2 processes are the same or 2 separate primaries.  Additionally there is no clear evidence of a primary squamous cell carcinoma (however my concern is that the area behind the right ear may represent the same process).  Patient also does have PET avid areas of his bowels which I do not suspect are related, but we will have evaluated further with EGD/colonoscopy as needed.  At this time the best treatment course would be to treat with Cemiplimab  which should be effective at treating both basal cell carcinoma and squamous cell carcinoma, effectively treating both even if they are separate processes.  Overall he voices understanding of this complicated situation and our plan moving forward.  Of note radiation oncology and ENT are on board and willing to assist with management of his current findings.  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 Previously discussed with radiology  who notes a repeat biopsy was not recommended and repeat imaging would be preferred.  --negative multiple myeloma labs with SPEP and serum free light chains showing no evidence of monoclonal gammopathy.  --Given his prior history of prostate cancer we ordered a PSA, which was normal. Repeated PSA previously, found to be within normal limits --CT C/A/P shows no evidence of disease elsewhere in the body in August 2022. Most recent PET CT scan on 10/02/2022 showed hypermetabolic osseous metastasis with a dominant mass centered about the inferior medial portion of the right mastoid, suspicious for site of primary squamous cell carcinoma. -- PET CT scan shows evidence of lesion behind the right ear as well as the known pelvic metastasis. -- Reviewed tissue with pathology to determine if both lesions are the same pathology or different origins.  At this time findings are most consistent with a basal cell primary behind his ear and squamous cell carcinoma of the pelvis.  It is possible these at the same pathology with the basal/squamous crossover, though they may represent separate primaries.  Either way Cemiplimab  therapy should be adequate to treat both. -- PD-L1 testing showed 0% PD-L1 score, however the use of cemiplimab  does not require PD-L1 positivity (lack of PD-L1 prevents the use of pembrolizumab)  PLAN: --Proceed with cycle 15 day 1 of Cemiplimab  immunotherapy today --Labs today show white blood cell count 6.7, hemoglobin 14.8, MCV 90.4, platelets 218 --PET CT scan on 07/30/2023 showed stable disease with a mild increase in FDG avidity in hip lesion. Next scan in Sept 2025.  -- Return to clinic for next Cycle of cemiplimab  in 3 weeks time.   # Bleeding of the Ear--resolved # Drainage from Ear-- mild  -- secondary due to erosion of the tumor into his ear canal. -- wound has healed without evidence of bleeding.  --Minor orange color discharge from the ear, mild bleeding at this time -- monitor for  now.   # FDG Avid Colon Lesion --Patient underwent colonoscopy with gastroenterology and found to have large polyps. -- No evidence of colonic malignancy.  Orders Placed This Encounter  Procedures   CBC with Differential (Cancer Center Only)    Standing Status:   Future    Expected Date:   11/24/2023    Expiration Date:   11/23/2024   CMP (  Cancer Center only)    Standing Status:   Future    Expected Date:   11/24/2023    Expiration Date:   11/23/2024   T4    Standing Status:   Future    Expected Date:   11/24/2023    Expiration Date:   11/23/2024   TSH    Standing Status:   Future    Expected Date:   11/24/2023    Expiration Date:   11/23/2024   CBC with Differential (Cancer Center Only)    Standing Status:   Future    Expected Date:   12/15/2023    Expiration Date:   12/14/2024   CMP (Cancer Center only)    Standing Status:   Future    Expected Date:   12/15/2023    Expiration Date:   12/14/2024   T4    Standing Status:   Future    Expected Date:   12/15/2023    Expiration Date:   12/14/2024   TSH    Standing Status:   Future    Expected Date:   12/15/2023    Expiration Date:   12/14/2024   CBC with Differential (Cancer Center Only)    Standing Status:   Future    Expected Date:   01/05/2024    Expiration Date:   01/04/2025   CMP (Cancer Center only)    Standing Status:   Future    Expected Date:   01/05/2024    Expiration Date:   01/04/2025   T4    Standing Status:   Future    Expected Date:   01/05/2024    Expiration Date:   01/04/2025   TSH    Standing Status:   Future    Expected Date:   01/05/2024    Expiration Date:   01/04/2025   CBC with Differential (Cancer Center Only)    Standing Status:   Future    Expected Date:   01/26/2024    Expiration Date:   01/25/2025   CMP (Cancer Center only)    Standing Status:   Future    Expected Date:   01/26/2024    Expiration Date:   01/25/2025   T4    Standing Status:   Future    Expected Date:   01/26/2024     Expiration Date:   01/25/2025   TSH    Standing Status:   Future    Expected Date:   01/26/2024    Expiration Date:   01/25/2025   CBC with Differential (Cancer Center Only)    Standing Status:   Future    Expected Date:   02/16/2024    Expiration Date:   02/15/2025   CMP (Cancer Center only)    Standing Status:   Future    Expected Date:   02/16/2024    Expiration Date:   02/15/2025   T4    Standing Status:   Future    Expected Date:   02/16/2024    Expiration Date:   02/15/2025   TSH    Standing Status:   Future    Expected Date:   02/16/2024    Expiration Date:   02/15/2025    All questions were answered. The patient knows to call the clinic with any problems, questions or concerns.  A total of more than 30 minutes were spent on this encounter with face-to-face time and non-face-to-face time, including preparing to see the patient, ordering tests and/or medications, counseling the patient and coordination  of care as outlined above.   Norleen IVAR Kidney, MD Department of Hematology/Oncology [facility] Phone: [phone] Pager: [phone] Email: [email or web address]   09/23/2023 10:58 AM

## 2023-09-23 NOTE — Patient Instructions (Signed)
 [facility] - A DEPT OF [facility]  Discharge Instructions: Thank you for choosing [facility] to provide your oncology and hematology care.   If you have a lab appointment with the Cancer Center, please go directly to the Cancer Center and check in at the registration area.   Wear comfortable clothing and clothing appropriate for easy access to any Portacath or PICC line.   We strive to give you quality time with your provider. You may need to reschedule your appointment if you arrive late (15 or more minutes).  Arriving late affects you and other patients whose appointments are after yours.  Also, if you miss three or more appointments without notifying the office, you may be dismissed from the clinic at the provider's discretion.      For prescription refill requests, have your pharmacy contact our office and allow 72 hours for refills to be completed.    Today you received the following chemotherapy and/or immunotherapy agents: Libtayo.       To help prevent nausea and vomiting after your treatment, we encourage you to take your nausea medication as directed.  BELOW ARE SYMPTOMS THAT SHOULD BE REPORTED IMMEDIATELY: *FEVER GREATER THAN 100.4 F (38 C) OR HIGHER *CHILLS OR SWEATING *NAUSEA AND VOMITING THAT IS NOT CONTROLLED WITH YOUR NAUSEA MEDICATION *UNUSUAL SHORTNESS OF BREATH *UNUSUAL BRUISING OR BLEEDING *URINARY PROBLEMS (pain or burning when urinating, or frequent urination) *BOWEL PROBLEMS (unusual diarrhea, constipation, pain near the anus) TENDERNESS IN MOUTH AND THROAT WITH OR WITHOUT PRESENCE OF ULCERS (sore throat, sores in mouth, or a toothache) UNUSUAL RASH, SWELLING OR PAIN  UNUSUAL VAGINAL DISCHARGE OR ITCHING   Items with * indicate a potential emergency and should be followed up as soon as possible or go to the Emergency Department if any problems should occur.  Please show the CHEMOTHERAPY ALERT CARD or IMMUNOTHERAPY  ALERT CARD at check-in to the Emergency Department and triage nurse.  Should you have questions after your visit or need to cancel or reschedule your appointment, please contact [facility] - A DEPT OF [facility]  Dept: [phone]  and follow the prompts.  Office hours are 8:00 a.m. to 4:30 p.m. Monday - Friday. Please note that voicemails left after 4:00 p.m. may not be returned until the following business day.  We are closed weekends and major holidays. You have access to a nurse at all times for urgent questions. Please call the main number to the clinic Dept: [phone] and follow the prompts.   For any non-urgent questions, you may also contact your provider using MyChart. We now offer e-Visits for anyone 71 and older to request care online for non-urgent symptoms. For details visit [email or web address].   Also download the MyChart app! Go to the app store, search "MyChart", open the app, select [facility], and log in with your MyChart username and password.

## 2023-09-24 ENCOUNTER — Other Ambulatory Visit: Payer: Self-pay

## 2023-09-24 LAB — T4: T4, Total: 7.6 ug/dL (ref 4.5–12.0)

## 2023-09-30 ENCOUNTER — Other Ambulatory Visit: Payer: Self-pay

## 2023-10-13 ENCOUNTER — Inpatient Hospital Stay (HOSPITAL_BASED_OUTPATIENT_CLINIC_OR_DEPARTMENT_OTHER): Admitting: Physician Assistant

## 2023-10-13 ENCOUNTER — Inpatient Hospital Stay

## 2023-10-13 ENCOUNTER — Inpatient Hospital Stay: Attending: Hematology and Oncology

## 2023-10-13 VITALS — BP 138/76 | HR 57 | Temp 97.3°F | Resp 18 | Wt 183.3 lb

## 2023-10-13 DIAGNOSIS — C7951 Secondary malignant neoplasm of bone: Secondary | ICD-10-CM

## 2023-10-13 DIAGNOSIS — C4441 Basal cell carcinoma of skin of scalp and neck: Secondary | ICD-10-CM | POA: Diagnosis not present

## 2023-10-13 DIAGNOSIS — C4492 Squamous cell carcinoma of skin, unspecified: Secondary | ICD-10-CM

## 2023-10-13 DIAGNOSIS — Z5112 Encounter for antineoplastic immunotherapy: Secondary | ICD-10-CM | POA: Diagnosis not present

## 2023-10-13 DIAGNOSIS — Z95828 Presence of other vascular implants and grafts: Secondary | ICD-10-CM

## 2023-10-13 DIAGNOSIS — Z79620 Long term (current) use of immunosuppressive biologic: Secondary | ICD-10-CM | POA: Insufficient documentation

## 2023-10-13 LAB — CBC WITH DIFFERENTIAL (CANCER CENTER ONLY)
Abs Immature Granulocytes: 0.02 K/uL (ref 0.00–0.07)
Basophils Absolute: 0 K/uL (ref 0.0–0.1)
Basophils Relative: 1 %
Eosinophils Absolute: 0.2 K/uL (ref 0.0–0.5)
Eosinophils Relative: 2 %
HCT: 43.4 % (ref 39.0–52.0)
Hemoglobin: 15.2 g/dL (ref 13.0–17.0)
Immature Granulocytes: 0 %
Lymphocytes Relative: 18 %
Lymphs Abs: 1.1 K/uL (ref 0.7–4.0)
MCH: 31.9 pg (ref 26.0–34.0)
MCHC: 35 g/dL (ref 30.0–36.0)
MCV: 91 fL (ref 80.0–100.0)
Monocytes Absolute: 0.6 K/uL (ref 0.1–1.0)
Monocytes Relative: 9 %
Neutro Abs: 4.6 K/uL (ref 1.7–7.7)
Neutrophils Relative %: 70 %
Platelet Count: 205 K/uL (ref 150–400)
RBC: 4.77 MIL/uL (ref 4.22–5.81)
RDW: 13.2 % (ref 11.5–15.5)
WBC Count: 6.5 K/uL (ref 4.0–10.5)
nRBC: 0 % (ref 0.0–0.2)

## 2023-10-13 LAB — CMP (CANCER CENTER ONLY)
ALT: 17 U/L (ref 0–44)
AST: 19 U/L (ref 15–41)
Albumin: 4.3 g/dL (ref 3.5–5.0)
Alkaline Phosphatase: 66 U/L (ref 38–126)
Anion gap: 4 — ABNORMAL LOW (ref 5–15)
BUN: 17 mg/dL (ref 8–23)
CO2: 27 mmol/L (ref 22–32)
Calcium: 9.5 mg/dL (ref 8.9–10.3)
Chloride: 109 mmol/L (ref 98–111)
Creatinine: 1.18 mg/dL (ref 0.61–1.24)
GFR, Estimated: 59 mL/min — ABNORMAL LOW (ref >60)
Glucose, Bld: 97 mg/dL (ref 70–99)
Potassium: 4.1 mmol/L (ref 3.5–5.1)
Sodium: 140 mmol/L (ref 135–145)
Total Bilirubin: 0.7 mg/dL (ref 0.0–1.2)
Total Protein: 6.7 g/dL (ref 6.5–8.1)

## 2023-10-13 LAB — TSH: TSH: 3.53 u[IU]/mL (ref 0.350–4.500)

## 2023-10-13 MED ORDER — SODIUM CHLORIDE 0.9% FLUSH
10.0000 mL | INTRAVENOUS | Status: DC | PRN
  Administered 2023-10-13: 10 mL

## 2023-10-13 MED ORDER — SODIUM CHLORIDE 0.9% FLUSH
10.0000 mL | INTRAVENOUS | Status: DC | PRN
Start: 2023-10-13 — End: 2023-10-13

## 2023-10-13 MED ORDER — SODIUM CHLORIDE 0.9 % IV SOLN
Freq: Once | INTRAVENOUS | Status: AC
Start: 2023-10-13 — End: 2023-10-13

## 2023-10-13 MED ORDER — SODIUM CHLORIDE 0.9 % IV SOLN
350.0000 mg | Freq: Once | INTRAVENOUS | Status: AC
  Administered 2023-10-13: 350 mg via INTRAVENOUS
  Filled 2023-10-13: qty 7

## 2023-10-13 NOTE — Progress Notes (Signed)
 [facility] [phone]   [phone]  PROGRESS NOTE  Patient Care Team: Yolande Toribio MATSU, MD as PCP - General (Internal Medicine) Watt Rush, MD as Attending Physician (Urology) Izell Domino, MD as Consulting Physician (Radiation Oncology) Malmfelt, Delon CROME, RN as Oncology Nurse Navigator (Oncology) Lynnell Nottingham, MD as Consulting Physician (Dermatology) Federico Rosario BROCKS, MD as Consulting Physician (Gastroenterology) Federico Rush ONEIDA MADISON, MD as Consulting Physician (Hematology and Oncology)  Hematological/Oncological History # Bone Lesions on CT/MRI # 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 Additionally there was noted to be a new enhancing lesion at the tip of the right greater trochanter measuring up to 1.7 cm also suspicious for metastatic disease. 09/27/2020: establish care with Dr. Federico 10/29/2020: CT biopsy performed of left iliac bone lesion. Pathology showed bone and marrow without evidence of carcinoma.  11/11/2022: Cycle 1 Day 1 of cemplimab therapy.  12/03/2022: Cycle 2 Day 1 of cemplimab therapy.  12/23/2022: Cycle 3 Day 1 of cemplimab therapy.  01/13/2023: Cycle 4 Day 1 of cemplimab therapy.  02/03/2023: Cycle 5 Day 1 of cemplimab therapy.  02/23/2023: Cycle 6 Day 1 of cemplimab therapy.  03/16/2023:  Cycle 7 Day 1 of cemplimab therapy.  04/06/2023: Cycle 8 Day 1 of cemplimab therapy.  04/28/2023: Cycle 9 Day 1 of cemplimab therapy.  05/18/2023: Cycle 10 Day 1 of cemplimab therapy.  06/08/2023: Cycle 11 Day 1 of cemplimab therapy.  06/30/2023: Cycle 12 Day 1 of cemplimab therapy.  07/21/2023: Cycle 13 Day 1 of cemplimab  therapy.  08/11/2023: Cycle 14 Day 1 of cemplimab therapy.  09/02/2023: Cycle 15 Day 1 of cemplimab therapy.  09/23/2023: Cycle 16 Day 1 of cemplimab therapy.  10/13/2023: Cycle 17 Day 1 of cemplimab therapy.   Interval History:  Martin Curry 89 y.o. 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 The patient's last visit was on 09/23/2023. In the interim since the last visit he has completed Cycle 16 of cemiplimab .  On exam today Martin Curry reports he is tolerating treatment without any significant changes.  He reports his energy and appetite are fairly stable.  He can complete his baseline ADLs on his own.  He denies nausea, vomiting or bowel habit changes.  He continues to have drainage from his right ear that is an orange-tinged color.  He reports the drainage is worse some days than others.  He denies any pain or discomfort coming from the right ear.  He reports that his ability to eat foods and take a bite has progressively worsened over time.  He denies fevers, chills, night sweats, shortness of breath, chest pain or cough. Overall he is willing and able to continue on immunotherapy at this time.  A full 10 point ROS is otherwise negative.   MEDICAL HISTORY:  Past Medical History:  Diagnosis Date   Atypical nevus 09/07/2002   left abdomen-slight   BCC (basal cell carcinoma of skin) 07/19/2014   left sideburn- +margin-exc.   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 if orignated from skin cancer   Nodular basal cell carcinoma (BCC) 11/21/2012   right side of scalp-CX35FU   Nodular basal cell carcinoma (BCC) 06/12/2014   left sideburn-CX35FU/exc.   Nodular basal cell carcinoma (BCC) 07/16/2015   left scalp-CX35FU   Polycythemia    Prostate cancer (HCC) 1990's   total prostatectomy and adjuvant radiation. Last PSA was 0.02 in 09/2011   SCC (squamous cell carcinoma) 04/03/2009   forehead-txpbx   SCC (squamous cell carcinoma) 07/16/2015   left front scalp   SCC (squamous cell carcinoma) 07/16/2015   left front scalp-Cx35FU   SCCA (squamous cell carcinoma) of skin 12/20/2019   Right forehead (in situ)   SCCA (squamous cell carcinoma) of skin 05/27/2020   Mid Parietal Scalp (in situ)   Superficial basal cell carcinoma (BCC) 11/21/2012   behind left ear-CX35FU, midback-CX35FU, right forehead-CX35FU-exc   Superficial basal cell carcinoma (BCC) 11/02/2017   left post neck-CX35FU   Superficial nodular basal cell carcinoma (BCC) 12/20/2019   Left temporal scalp   Superficial nodular basal cell carcinoma (BCC) 05/27/2020   Right Preauricular area (curet and 5FU)    SURGICAL HISTORY: Past Surgical History:  Procedure Laterality Date   ADJACENT TISSUE TRANSFER/TISSUE REARRANGEMENT N/A 05/22/2019   Procedure: COMPLEX CLOSURE OF NECK WOUND;  Surgeon: Elisabeth Craig RAMAN, MD;  Location: [facility] OR;  Service: Plastics;  Laterality: N/A;   ALLOGRAFT APPLICATION N/A 05/22/2019   Procedure: POSSIBLE FACIAL NERVE RECONSTRUCTION WITH NERVE ALLOGRAFT VS AUTOGRAFT;  Surgeon: Elisabeth Craig RAMAN, MD;  Location: [facility] OR;  Service: Plastics;  Laterality: N/A;   BLADDER SURGERY     due to prostatectomy.    CATARACT EXTRACTION     right   COLONOSCOPY     neg in the past; due in 2014 with  Dr. Jakie   EYE SURGERY     1950   HERNIA REPAIR     IR IMAGING GUIDED PORT INSERTION  11/30/2022   MOHS  SURGERY     NECK DISSECTION  05/22/2019    NECK DISSECTION   PAROTIDECTOMY  05/22/2019   PAROTIDECTOMY with facial nerve dissection    PAROTIDECTOMY Right 05/22/2019   Procedure: PAROTIDECTOMY;  Surgeon: Jesus Oliphant, MD;  Location: [facility] OR;  Service: ENT;  Laterality: Right;   PROSTATE SURGERY     RADICAL NECK DISSECTION Right 05/22/2019   Procedure: RADICAL NECK DISSECTION;  Surgeon: Jesus Oliphant, MD;  Location: [facility] OR;  Service: ENT;  Laterality: Right;   TONSILLECTOMY      SOCIAL HISTORY: Social History   Socioeconomic History   Marital status: Married    Spouse name: Not on file   Number of children: 3   Years of education: Not on file   Highest education level: Not on file  Occupational History    Comment: retired Scientist, 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   Occupation: retired  Tobacco Use   Smoking status: Never   Smokeless tobacco: Never  Vaping Use   Vaping status: Never Used  Substance and Sexual  Activity   Alcohol  use: No   Drug use: No   Sexual activity: Not on file  Other Topics Concern   Not on file  Social History Narrative   Not on file   Social Drivers of Health   Financial Resource Strain: Not on file  Food Insecurity: No Food Insecurity (10/29/2022)   Hunger Vital Sign    Worried About Running Out of Food in the Last Year: Never true    Ran Out of Food in the Last Year: Never true  Transportation Needs: No Transportation Needs (10/29/2022)   PRAPARE - Administrator, Civil Service (Medical): No    Lack of Transportation (Non-Medical): No  Physical Activity: Not on file  Stress: Not on file  Social Connections: Not on file  Intimate Partner Violence: Not At Risk (10/29/2022)   Humiliation, Afraid, Rape, and Kick questionnaire    Fear of Current or Ex-Partner: No    Emotionally Abused: No    Physically Abused: No    Sexually Abused: No    FAMILY HISTORY: Family History  Problem Relation Age of Onset   Uterine cancer Mother 6   Ovarian  cancer Mother    Lung cancer Father 59   Prostate cancer Brother    Prostate cancer Brother    Colon cancer Neg Hx    Stomach cancer Neg Hx    Esophageal cancer Neg Hx    Rectal cancer Neg Hx     ALLERGIES:  is allergic to methylprednisolone, penicillins, latex, and tape.  MEDICATIONS:  Current Outpatient Medications  Medication Sig Dispense Refill   amLODipine  (NORVASC ) 5 MG tablet Take 5 mg by mouth daily.      aspirin  81 MG tablet Take 81 mg by mouth daily.     benazepril  (LOTENSIN ) 10 MG tablet Take 10 mg by mouth daily.     famotidine -calcium carbonate-magnesium hydroxide (PEPCID  COMPLETE) 10-800-165 MG chewable tablet Take one tablet PRN     levothyroxine  (SYNTHROID ) 25 MCG tablet Take 25 mcg by mouth daily before breakfast. 2 tABS M,W,F and one the other days     lidocaine -prilocaine  (EMLA ) cream Apply 1 Application topically as needed. 30 g 0   Multiple Vitamin (MULTIVITAMIN) tablet Take 1 tablet by mouth daily.     ondansetron  (ZOFRAN ) 8 MG tablet Take 1 tablet (8 mg total) by mouth every 8 (eight) hours as needed for nausea or vomiting. 20 tablet 1   potassium citrate  (UROCIT-K ) 10 MEQ (1080 MG) SR tablet Take 10 mEq by mouth 3 (three) times daily with meals.     simvastatin  (ZOCOR ) 20 MG tablet Take 20 mg by mouth every evening.     No current facility-administered medications for this visit.   Facility-Administered Medications Ordered in Other Visits  Medication Dose Route Frequency Provider Last Rate Last Admin   sodium chloride  flush (NS) 0.9 % injection 10 mL  10 mL Intracatheter PRN Federico Norleen ONEIDA MADISON, MD        REVIEW OF SYSTEMS:   Constitutional: ( - ) fevers, ( - )  chills , ( - ) night sweats Eyes: ( - ) blurriness of vision, ( - ) double vision, ( - ) watery eyes Ears, nose, mouth, throat, and face: ( - ) mucositis, ( - ) sore throat Respiratory: ( - ) cough, ( - ) dyspnea, ( - ) wheezes Cardiovascular: ( - ) palpitation, ( - ) chest discomfort, ( - ) lower  extremity swelling Gastrointestinal:  ( - )  nausea, ( - ) heartburn, ( - ) change in bowel habits Skin: ( - ) abnormal skin rashes Lymphatics: ( - ) new lymphadenopathy, ( - ) easy bruising Neurological: ( - ) numbness, ( - ) tingling, ( - ) new weaknesses Behavioral/Psych: ( - ) mood change, ( - ) new changes  All other systems were reviewed with the patient and are negative.  PHYSICAL EXAMINATION: ECOG PERFORMANCE STATUS: 1 - Symptomatic but completely ambulatory  Vitals:   10/13/23 1027  BP: 138/76  Pulse: (!) 57  Resp: 18  Temp: (!) 97.3 F (36.3 C)  SpO2: 97%       Filed Weights   10/13/23 1027  Weight: 183 lb 4.8 oz (83.1 kg)        GENERAL: Well-appearing elderly Caucasian male, alert, no distress and comfortable SKIN: skin color, texture, turgor are normal, no rashes or significant lesions EYES: conjunctiva are pink and non-injected, sclera clear EAR: Orange/red colored drainage from inner ear.  LUNGS: clear to auscultation and percussion with normal breathing effort HEART: regular rate & rhythm and no murmurs and no lower extremity edema Musculoskeletal: no cyanosis of digits and no clubbing  PSYCH: alert & oriented x 3, fluent speech NEURO: no focal motor/sensory deficits  LABORATORY DATA:  I have reviewed the data as listed    Latest Ref Rng & Units 10/13/2023   10:18 AM 09/23/2023    9:33 AM 09/02/2023   10:06 AM  CBC  WBC 4.0 - 10.5 K/uL 6.5  6.7  6.8   Hemoglobin 13.0 - 17.0 g/dL 84.7  85.1  85.3   Hematocrit 39.0 - 52.0 % 43.4  42.5  42.1   Platelets 150 - 400 K/uL 205  218  228        Latest Ref Rng & Units 10/13/2023   10:18 AM 09/23/2023    9:33 AM 09/02/2023   10:06 AM  CMP  Glucose 70 - 99 mg/dL 97  878  92   BUN 8 - 23 mg/dL 17  18  18    Creatinine 0.61 - 1.24 mg/dL 8.81  8.65  8.75   Sodium 135 - 145 mmol/L 140  141  141   Potassium 3.5 - 5.1 mmol/L 4.1  4.0  4.1   Chloride 98 - 111 mmol/L 109  108  109   CO2 22 - 32 mmol/L 27  28   28    Calcium 8.9 - 10.3 mg/dL 9.5  9.5  9.6   Total Protein 6.5 - 8.1 g/dL 6.7  6.6  6.7   Total Bilirubin 0.0 - 1.2 mg/dL 0.7  0.6  0.6   Alkaline Phos 38 - 126 U/L 66  66  68   AST 15 - 41 U/L 19  22  20    ALT 0 - 44 U/L 17  22  17      Lab Results  Component Value Date   MPROTEIN Not Observed 09/27/2020   Lab Results  Component Value Date   KPAFRELGTCHN 25.6 (H) 09/27/2020   LAMBDASER 23.0 09/27/2020   KAPLAMBRATIO 1.11 09/27/2020    RADIOGRAPHIC STUDIES: No results found.    ASSESSMENT & PLAN Martin Curry is a 89 y.o. 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  # Metastatic Squamous Cell Cancer to the Pelvic Bones # Basal cell carcinoma of the right head/neck --initial biopsy shows no evidence  of malignancy. Previously discussed with radiology who notes a repeat biopsy was not recommended and repeat imaging would be preferred.  --negative multiple myeloma labs with SPEP and serum free light chains showing no evidence of monoclonal gammopathy.  --Given his prior history of prostate cancer we ordered a PSA, which was normal. Repeated PSA previously, found to be within normal limits --CT C/A/P shows no evidence of disease elsewhere in the body in August 2022. Most recent PET CT scan on 10/02/2022 showed hypermetabolic osseous metastasis with a dominant mass centered about the inferior medial portion of the right mastoid, suspicious for site of primary squamous cell carcinoma. -- PET CT scan shows evidence of lesion behind the right ear as well as the known pelvic metastasis. -- Reviewed tissue with pathology to determine if both lesions are the same pathology or different origins.  At this time findings are most consistent with a basal cell primary behind his ear and squamous cell carcinoma of the pelvis.  It is possible these at the same pathology  with the basal/squamous crossover, though they may represent separate primaries.  Either way Cemiplimab  therapy should be adequate to treat both. -- PD-L1 testing showed 0% PD-L1 score, however the use of cemiplimab  does not require PD-L1 positivity (lack of PD-L1 prevents the use of pembrolizumab)  PLAN: --Proceed with cycle 17 day 1 of Cemiplimab  immunotherapy today --Labs today show white blood cell count 6.5, hemoglobin 15.2, MCV 91.0, platelets 205, creatinine and LFTs are normal.  --PET CT scan on 07/30/2023 showed stable disease with a mild increase in FDG avidity in hip lesion. Next scan in Sept 2025, ordered today --Proceed with treatment today without any modifications.  -- Return to clinic for next cycle of cemiplimab  in 3 weeks time.   # Bleeding of the Ear--resolved # Drainage from Ear-- mild  -- secondary due to erosion of the tumor into his ear canal. -- wound has healed without evidence of bleeding.  -- Orange color discharge from the ear  at this time -- monitor for now.   # FDG Avid Colon Lesion --Patient underwent colonoscopy with gastroenterology and found to have large polyps. -- No evidence of colonic malignancy.  No orders of the defined types were placed in this encounter.   All questions were answered. The patient knows to call the clinic with any problems, questions or concerns.  A total of more than 30 minutes were spent on this encounter with face-to-face time and non-face-to-face time, including preparing to see the patient, ordering tests and/or medications, counseling the patient and coordination of care as outlined above.   Johnston Police PA-C Dept of Hematology and Oncology [facility] Phone: [phone]    10/13/2023 1:13 PM

## 2023-10-13 NOTE — Patient Instructions (Signed)
 [facility] - A DEPT OF [facility]  Discharge Instructions: Thank you for choosing [facility] to provide your oncology and hematology care.   If you have a lab appointment with the Cancer Center, please go directly to the Cancer Center and check in at the registration area.   Wear comfortable clothing and clothing appropriate for easy access to any Portacath or PICC line.   We strive to give you quality time with your provider. You may need to reschedule your appointment if you arrive late (15 or more minutes).  Arriving late affects you and other patients whose appointments are after yours.  Also, if you miss three or more appointments without notifying the office, you may be dismissed from the clinic at the provider's discretion.      For prescription refill requests, have your pharmacy contact our office and allow 72 hours for refills to be completed.    Today you received the following chemotherapy and/or immunotherapy agents Libtayo  To help prevent nausea and vomiting after your treatment, we encourage you to take your nausea medication as directed.  BELOW ARE SYMPTOMS THAT SHOULD BE REPORTED IMMEDIATELY: *FEVER GREATER THAN 100.4 F (38 C) OR HIGHER *CHILLS OR SWEATING *NAUSEA AND VOMITING THAT IS NOT CONTROLLED WITH YOUR NAUSEA MEDICATION *UNUSUAL SHORTNESS OF BREATH *UNUSUAL BRUISING OR BLEEDING *URINARY PROBLEMS (pain or burning when urinating, or frequent urination) *BOWEL PROBLEMS (unusual diarrhea, constipation, pain near the anus) TENDERNESS IN MOUTH AND THROAT WITH OR WITHOUT PRESENCE OF ULCERS (sore throat, sores in mouth, or a toothache) UNUSUAL RASH, SWELLING OR PAIN  UNUSUAL VAGINAL DISCHARGE OR ITCHING   Items with * indicate a potential emergency and should be followed up as soon as possible or go to the Emergency Department if any problems should occur.  Please show the CHEMOTHERAPY ALERT CARD or IMMUNOTHERAPY ALERT  CARD at check-in to the Emergency Department and triage nurse.  Should you have questions after your visit or need to cancel or reschedule your appointment, please contact [facility] - [facility] DE[facility]  Dept: [phone]  and follow the prompts.  Office hours are 8:00 a.m. to 4:30 p.m. Monday - Friday. Please note that voicemails left after 4:00 p.m. may not be returned until the following business day.  We are closed weekends and major holidays. You have access to a nurse at all times for urgent questions. Please call the main number to the clinic Dept: [phone] and follow the prompts.   For any non-urgent questions, you may also contact your provider using MyChart. We now offer e-Visits for anyone 59 and older to request care online for non-urgent symptoms. For details visit [email or web address].   Also download the MyChart app! Go to the app store, search MyChart, open the app, select [place], and log in with your MyChart username and password.

## 2023-10-14 LAB — T4: T4, Total: 9.3 ug/dL (ref 4.5–12.0)

## 2023-10-22 ENCOUNTER — Other Ambulatory Visit: Payer: Self-pay

## 2023-10-28 ENCOUNTER — Encounter (HOSPITAL_COMMUNITY): Admission: RE | Admit: 2023-10-28 | Source: Ambulatory Visit

## 2023-10-29 DIAGNOSIS — Z85828 Personal history of other malignant neoplasm of skin: Secondary | ICD-10-CM | POA: Diagnosis not present

## 2023-10-29 DIAGNOSIS — D692 Other nonthrombocytopenic purpura: Secondary | ICD-10-CM | POA: Diagnosis not present

## 2023-10-29 DIAGNOSIS — C4441 Basal cell carcinoma of skin of scalp and neck: Secondary | ICD-10-CM | POA: Diagnosis not present

## 2023-10-29 DIAGNOSIS — L814 Other melanin hyperpigmentation: Secondary | ICD-10-CM | POA: Diagnosis not present

## 2023-10-29 DIAGNOSIS — D0462 Carcinoma in situ of skin of left upper limb, including shoulder: Secondary | ICD-10-CM | POA: Diagnosis not present

## 2023-11-01 ENCOUNTER — Encounter (HOSPITAL_COMMUNITY)
Admission: RE | Admit: 2023-11-01 | Discharge: 2023-11-01 | Disposition: A | Discharge status: Home / Self Care | Source: Ambulatory Visit | Attending: Physician Assistant | Admitting: Physician Assistant

## 2023-11-01 DIAGNOSIS — C4441 Basal cell carcinoma of skin of scalp and neck: Secondary | ICD-10-CM | POA: Diagnosis not present

## 2023-11-01 DIAGNOSIS — C7951 Secondary malignant neoplasm of bone: Secondary | ICD-10-CM | POA: Insufficient documentation

## 2023-11-01 DIAGNOSIS — C4492 Squamous cell carcinoma of skin, unspecified: Secondary | ICD-10-CM | POA: Insufficient documentation

## 2023-11-01 LAB — GLUCOSE, CAPILLARY: Glucose-Capillary: 83 mg/dL (ref 70–99)

## 2023-11-01 MED ORDER — FLUDEOXYGLUCOSE F - 18 (FDG) INJECTION
9.0700 | Freq: Once | INTRAVENOUS | Status: AC
Start: 2023-11-01 — End: 2023-11-01
  Administered 2023-11-01: 9.07 via INTRAVENOUS

## 2023-11-02 DIAGNOSIS — Z23 Encounter for immunization: Secondary | ICD-10-CM | POA: Diagnosis not present

## 2023-11-03 NOTE — Progress Notes (Unsigned)
 [facility] [phone]   [phone]) [phone]  PROGRESS NOTE  Patient Care Team: Yolande Toribio MATSU, MD as PCP - General (Internal Medicine) Watt Rush, MD as Attending Physician (Urology) Izell Domino, MD as Consulting Physician (Radiation Oncology) Malmfelt, Delon CROME, RN as Oncology Nurse Navigator (Oncology) Lynnell Nottingham, MD as Consulting Physician (Dermatology) Federico Rosario BROCKS, MD as Consulting Physician (Gastroenterology) Federico Rush ONEIDA MADISON, MD as Consulting Physician (Hematology and Oncology)  Hematological/Oncological History # Bone Lesions on CT/MRI # 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 Additionally there was noted to be a new enhancing lesion at the tip of the right greater trochanter measuring up to 1.7 cm also suspicious for metastatic disease. 09/27/2020: establish care with Dr. Federico 10/29/2020: CT biopsy performed of left iliac bone lesion. Pathology showed bone and marrow without evidence of carcinoma.  11/11/2022: Cycle 1 Day 1 of cemplimab therapy.  12/03/2022: Cycle 2 Day 1 of cemplimab therapy.  12/23/2022: Cycle 3 Day 1 of cemplimab therapy.  01/13/2023: Cycle 4 Day 1 of cemplimab therapy.  02/03/2023: Cycle 5 Day 1 of cemplimab therapy.  02/23/2023: Cycle 6 Day 1 of cemplimab therapy.  03/16/2023:  Cycle 7 Day 1 of cemplimab therapy.  04/06/2023: Cycle 8 Day 1 of cemplimab therapy.  04/28/2023: Cycle 9 Day 1 of cemplimab therapy.  05/18/2023: Cycle 10 Day 1 of cemplimab therapy.  06/08/2023: Cycle 11 Day 1 of cemplimab therapy.  06/30/2023: Cycle 12 Day 1 of cemplimab therapy.  07/21/2023: Cycle 13 Day 1 of cemplimab  therapy.  08/11/2023: Cycle 14 Day 1 of cemplimab therapy.  09/02/2023: Cycle 15 Day 1 of cemplimab therapy.  09/23/2023: Cycle 16 Day 1 of cemplimab therapy.  10/13/2023: Cycle 17 Day 1 of cemplimab therapy.   Interval History:  Martin Curry 89 y.o. 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 The patient's last visit was on 10/13/2023. In the interim since the last visit he has completed Cycle 17 of cemiplimab .  On exam today Martin Curry reports ***   MEDICAL HISTORY:  Past Medical History:  Diagnosis Date   Atypical nevus 09/07/2002   left abdomen-slight   BCC (basal cell carcinoma of skin) 07/19/2014   left sideburn- +margin-exc.   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 if orignated from skin cancer   Nodular basal cell carcinoma (BCC) 11/21/2012   right side of scalp-CX35FU   Nodular basal cell carcinoma (BCC) 06/12/2014   left sideburn-CX35FU/exc.   Nodular basal cell carcinoma (BCC) 07/16/2015   left  scalp-CX35FU   Polycythemia    Prostate cancer (HCC) 1990's   total prostatectomy and adjuvant radiation. Last PSA was 0.02 in 09/2011   SCC (squamous cell carcinoma) 04/03/2009   forehead-txpbx   SCC (squamous cell carcinoma) 07/16/2015   left front scalp   SCC (squamous cell carcinoma) 07/16/2015   left front scalp-Cx35FU   SCCA (squamous cell carcinoma) of skin 12/20/2019    Right forehead (in situ)   SCCA (squamous cell carcinoma) of skin 05/27/2020   Mid Parietal Scalp (in situ)   Superficial basal cell carcinoma (BCC) 11/21/2012   behind left ear-CX35FU, midback-CX35FU, right forehead-CX35FU-exc   Superficial basal cell carcinoma (BCC) 11/02/2017   left post neck-CX35FU   Superficial nodular basal cell carcinoma (BCC) 12/20/2019   Left temporal scalp   Superficial nodular basal cell carcinoma (BCC) 05/27/2020   Right Preauricular area (curet and 5FU)    SURGICAL HISTORY: Past Surgical History:  Procedure Laterality Date   ADJACENT TISSUE TRANSFER/TISSUE REARRANGEMENT N/A 05/22/2019   Procedure: COMPLEX CLOSURE OF NECK WOUND;  Surgeon: Elisabeth Craig RAMAN, MD;  Location: [facility] OR;  Service: Plastics;  Laterality: N/A;   ALLOGRAFT APPLICATION N/A 05/22/2019   Procedure: POSSIBLE FACIAL NERVE RECONSTRUCTION WITH NERVE ALLOGRAFT VS AUTOGRAFT;  Surgeon: Elisabeth Craig RAMAN, MD;  Location: [facility] OR;  Service: Plastics;  Laterality: N/A;   BLADDER SURGERY     due to prostatectomy.    CATARACT EXTRACTION     right   COLONOSCOPY     neg in the past; due in 2014 with [place] Dr. Jakie   EYE SURGERY     1950   HERNIA REPAIR     IR IMAGING GUIDED PORT INSERTION  11/30/2022   MOHS SURGERY     NECK DISSECTION  05/22/2019    NECK DISSECTION   PAROTIDECTOMY  05/22/2019   PAROTIDECTOMY with facial nerve dissection    PAROTIDECTOMY Right 05/22/2019   Procedure: PAROTIDECTOMY;  Surgeon: Jesus Oliphant, MD;  Location: [facility] OR;  Service: ENT;  Laterality: Right;   PROSTATE SURGERY     RADICAL NECK DISSECTION Right 05/22/2019   Procedure: RADICAL NECK DISSECTION;  Surgeon: Jesus Oliphant, MD;  Location: [facility] OR;  Service: ENT;  Laterality: Right;   TONSILLECTOMY      SOCIAL HISTORY: Social History   Socioeconomic History   Marital status: Married    Spouse name: Not on file   Number of children: 3   Years of education: Not on file   Highest education level: Not on file   Occupational History    Comment: retired Scientist, [employer]; 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   Occupation: retired  Tobacco Use   Smoking status: Never   Smokeless tobacco: Never  Vaping Use   Vaping status: Never Used  Substance and Sexual Activity   Alcohol  use: No   Drug use: No   Sexual activity: Not on file  Other Topics Concern   Not on file  Social History Narrative   Not on file   Social Drivers of Health   Financial Resource Strain: Not on file  Food Insecurity: No Food Insecurity (10/29/2022)   Hunger Vital Sign    Worried About Running Out of Food in the Last Year: Never true    Ran Out of Food in the Last Year: Never true  Transportation Needs: No Transportation Needs (10/29/2022)   PRAPARE - Administrator, Civil Service (Medical): No    Lack of Transportation (Non-Medical): No  Physical Activity: Not on file  Stress: Not  on file  Social Connections: Not on file  Intimate Partner Violence: Not At Risk (10/29/2022)   Humiliation, Afraid, Rape, and Kick questionnaire    Fear of Current or Ex-Partner: No    Emotionally Abused: No    Physically Abused: No    Sexually Abused: No    FAMILY HISTORY: Family History  Problem Relation Age of Onset   Uterine cancer Mother 46   Ovarian cancer Mother    Lung cancer Father 71   Prostate cancer Brother    Prostate cancer Brother    Colon cancer Neg Hx    Stomach cancer Neg Hx    Esophageal cancer Neg Hx    Rectal cancer Neg Hx     ALLERGIES:  is allergic to methylprednisolone, penicillins, latex, and tape.  MEDICATIONS:  Current Outpatient Medications  Medication Sig Dispense Refill   amLODipine  (NORVASC ) 5 MG tablet Take 5 mg by mouth daily.      aspirin  81 MG tablet Take 81 mg by mouth daily.     benazepril  (LOTENSIN ) 10 MG tablet Take 10 mg by mouth daily.     famotidine -calcium carbonate-magnesium hydroxide (PEPCID  COMPLETE) 10-800-165 MG chewable tablet Take one tablet PRN     levothyroxine  (SYNTHROID ) 25 MCG  tablet Take 25 mcg by mouth daily before breakfast. 2 tABS M,W,F and one the other days     lidocaine -prilocaine  (EMLA ) cream Apply 1 Application topically as needed. 30 g 0   Multiple Vitamin (MULTIVITAMIN) tablet Take 1 tablet by mouth daily.     ondansetron  (ZOFRAN ) 8 MG tablet Take 1 tablet (8 mg total) by mouth every 8 (eight) hours as needed for nausea or vomiting. 20 tablet 1   potassium citrate  (UROCIT-K ) 10 MEQ (1080 MG) SR tablet Take 10 mEq by mouth 3 (three) times daily with meals.     simvastatin  (ZOCOR ) 20 MG tablet Take 20 mg by mouth every evening.     No current facility-administered medications for this visit.    REVIEW OF SYSTEMS:   Constitutional: ( - ) fevers, ( - )  chills , ( - ) night sweats Eyes: ( - ) blurriness of vision, ( - ) double vision, ( - ) watery eyes Ears, nose, mouth, throat, and face: ( - ) mucositis, ( - ) sore throat Respiratory: ( - ) cough, ( - ) dyspnea, ( - ) wheezes Cardiovascular: ( - ) palpitation, ( - ) chest discomfort, ( - ) lower extremity swelling Gastrointestinal:  ( - ) nausea, ( - ) heartburn, ( - ) change in bowel habits Skin: ( - ) abnormal skin rashes Lymphatics: ( - ) new lymphadenopathy, ( - ) easy bruising Neurological: ( - ) numbness, ( - ) tingling, ( - ) new weaknesses Behavioral/Psych: ( - ) mood change, ( - ) new changes  All other systems were reviewed with the patient and are negative.  PHYSICAL EXAMINATION: ECOG PERFORMANCE STATUS: 1 - Symptomatic but completely ambulatory  There were no vitals filed for this visit.      There were no vitals filed for this visit.       GENERAL: Well-appearing elderly Caucasian male, alert, no distress and comfortable SKIN: skin color, texture, turgor are normal, no rashes or significant lesions EYES: conjunctiva are pink and non-injected, sclera clear EAR: Orange/red colored drainage from inner ear.  LUNGS: clear to auscultation and percussion with normal breathing  effort HEART: regular rate & rhythm and no murmurs and no lower extremity edema Musculoskeletal: no  cyanosis of digits and no clubbing  PSYCH: alert & oriented x 3, fluent speech NEURO: no focal motor/sensory deficits  LABORATORY DATA:  I have reviewed the data as listed    Latest Ref Rng & Units 10/13/2023   10:18 AM 09/23/2023    9:33 AM 09/02/2023   10:06 AM  CBC  WBC 4.0 - 10.5 K/uL 6.5  6.7  6.8   Hemoglobin 13.0 - 17.0 g/dL 84.7  85.1  85.3   Hematocrit 39.0 - 52.0 % 43.4  42.5  42.1   Platelets 150 - 400 K/uL 205  218  228        Latest Ref Rng & Units 10/13/2023   10:18 AM 09/23/2023    9:33 AM 09/02/2023   10:06 AM  CMP  Glucose 70 - 99 mg/dL 97  878  92   BUN 8 - 23 mg/dL 17  18  18    Creatinine 0.61 - 1.24 mg/dL 8.81  8.65  8.75   Sodium 135 - 145 mmol/L 140  141  141   Potassium 3.5 - 5.1 mmol/L 4.1  4.0  4.1   Chloride 98 - 111 mmol/L 109  108  109   CO2 22 - 32 mmol/L 27  28  28    Calcium 8.9 - 10.3 mg/dL 9.5  9.5  9.6   Total Protein 6.5 - 8.1 g/dL 6.7  6.6  6.7   Total Bilirubin 0.0 - 1.2 mg/dL 0.7  0.6  0.6   Alkaline Phos 38 - 126 U/L 66  66  68   AST 15 - 41 U/L 19  22  20    ALT 0 - 44 U/L 17  22  17      Lab Results  Component Value Date   MPROTEIN Not Observed 09/27/2020   Lab Results  Component Value Date   KPAFRELGTCHN 25.6 (H) 09/27/2020   LAMBDASER 23.0 09/27/2020   KAPLAMBRATIO 1.11 09/27/2020    RADIOGRAPHIC STUDIES: NM PET Image Restage (PS) Whole Body Result Date: 11/01/2023 CLINICAL DATA:  Subsequent treatment strategy for metastatic squamous cell carcinoma of the skin. EXAM: NUCLEAR MEDICINE PET WHOLE BODY TECHNIQUE: 9.07 mCi F-18 FDG was injected intravenously. Full-ring PET imaging was performed from the head to foot after the radiotracer. CT data was obtained and used for attenuation correction and anatomic localization. Fasting blood glucose: 83 mg/dl COMPARISON:  Multiple prior PET CTs.  The most recent is 07/30/2023. FINDINGS:  Mediastinal blood pool activity: SUV max 2.7 HEAD/NECK: Stable surgical changes from a radical posterior neck dissections right side. Stable destructive bony changes involving the right skull base including the right mastoid bone and occiput and C1. Mild residual hypermetabolism in the soft tissues posteriorly with SUV max of 4.3. This was previously 4.7. No hypermetabolic cervical lymph nodes. Incidental CT findings: Stable bilateral carotid artery calcifications. CHEST: Persistent mediastinal and hilar adenopathy. No significant interval change in size or hypermetabolism. Right hilar node has an SUV max of 8.3 and was previously 8.9. Left hilar node has an SUV max of 8.7 and was previously 7.7. Right-sided subcarinal node has an SUV max of 11.3 and was previously 10.9. No hypermetabolic pulmonary lesions or worrisome pulmonary nodules on the chest CT. No supraclavicular or axillary adenopathy. Incidental CT findings: Stable small hiatal hernia. Stable ascending thoracic aortic aneurysm, atherosclerotic calcifications and three-vessel coronary artery disease. ABDOMEN/PELVIS: No abnormal hypermetabolic activity within the liver, pancreas, adrenal glands, or spleen. No hypermetabolic lymph nodes in the abdomen or pelvis. Incidental CT findings:  Stable aortic and branch vessel calcifications. 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 Stable simple renal cysts. Stable colonic diverticulosis. Status post prostatectomy. SKELETON: The lytic hypermetabolic bone lesion involving the left iliac bone is stable in size. SUV max is 8.8 and was previously 6.0. New sclerotic 6 mm hypermetabolic lesion involving the right sacrum has an SUV max of 5.9. No other hypermetabolic bone lesions are identified. Incidental CT findings: Stable numerous sclerotic bone lesions without hypermetabolism mainly involving the spine. EXTREMITIES: No abnormal hypermetabolic activity in the lower extremities. Incidental CT findings: none  IMPRESSION: 1. Stable surgical changes from a radical posterior neck dissection on the right side. Stable destructive bony changes involving the right skull base and upper cervical spine. Mild stable residual hypermetabolism in the soft tissues posteriorly. No new or progressive findings. No cervical adenopathy. 2. Stable sized hypermetabolic mediastinal and hilar lymph nodes with slight interval increase in hypermetabolism. No new disease. 3. Stable sized lytic hypermetabolic bone lesion involving the left iliac bone with slight progression of hypermetabolism. New hypermetabolic sclerotic lesion involving the right sacrum. 4. Stable numerous sclerotic bone lesions without hypermetabolism. 5. No findings for metastatic disease involving the abdomen/pelvis or extremities. 6. Aortic atherosclerosis. Aortic Atherosclerosis (ICD10-I70.0). Electronically Signed   By: MYRTIS Stammer [name]   On: 11/01/2023 17:53      ASSESSMENT & PLAN Martin Curry is a 89 y.o. 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 Previously discussed with radiology who notes a repeat biopsy was not recommended and repeat imaging would be preferred.  --negative multiple myeloma labs with SPEP and serum free light chains showing no evidence of monoclonal gammopathy.  --Given his prior history of prostate cancer we ordered a PSA, which was normal. Repeated PSA previously, found to be within normal limits --CT C/A/P shows no evidence of disease elsewhere in the body in August 2022. Most recent PET CT scan on 10/02/2022 showed hypermetabolic osseous metastasis with a dominant mass centered about the inferior medial portion of the right mastoid, suspicious for  site of primary squamous cell carcinoma. -- PET CT scan shows evidence of lesion behind the right ear as well as the known pelvic metastasis. -- Reviewed tissue with pathology to determine if both lesions are the same pathology or different origins.  At this time findings are most consistent with a basal cell primary behind his ear and squamous cell carcinoma of the pelvis.  It is possible these at the same pathology with the basal/squamous crossover, though they may represent separate primaries.  Either way Cemiplimab  therapy should be adequate to treat both. -- PD-L1 testing showed 0% PD-L1 score, however the use of cemiplimab  does not require PD-L1 positivity (lack of PD-L1 prevents the use of pembrolizumab)  PLAN: --Proceed with cycle 18 day 1 of Cemiplimab  immunotherapy today --Labs today show white blood cell count ***. creatinine and LFTs are normal.  --PET CT scan on 07/30/2023 showed stable disease with a mild increase in FDG avidity in hip lesion. Next scan in Sept 2025, ordered today --Proceed with treatment today without any modifications.  -- Return to clinic for next cycle of cemiplimab  in 3 weeks time.   # Bleeding of the Ear--resolved # Drainage from Ear-- mild  --  secondary due to erosion of the tumor into his ear canal. -- wound has healed without evidence of bleeding.  -- Orange color discharge from the ear  at this time -- monitor for now.   # FDG Avid Colon Lesion --Patient underwent colonoscopy with gastroenterology and found to have large polyps. -- No evidence of colonic malignancy.  No orders of the defined types were placed in this encounter.   All questions were answered. The patient knows to call the clinic with any problems, questions or concerns.  A total of more than 30 minutes were spent on this encounter with face-to-face time and non-face-to-face time, including preparing to see the patient, ordering tests and/or medications, counseling the patient and  coordination of care as outlined above.   Norleen IVAR Kidney, MD Department of Hematology/Oncology [facility] Phone: [phone] Pager: [phone] Email: [email or web address]   11/03/2023 5:46 PM

## 2023-11-04 ENCOUNTER — Other Ambulatory Visit: Payer: Self-pay | Admitting: *Deleted

## 2023-11-04 ENCOUNTER — Inpatient Hospital Stay (HOSPITAL_BASED_OUTPATIENT_CLINIC_OR_DEPARTMENT_OTHER)

## 2023-11-04 ENCOUNTER — Inpatient Hospital Stay (HOSPITAL_BASED_OUTPATIENT_CLINIC_OR_DEPARTMENT_OTHER): Admitting: Hematology and Oncology

## 2023-11-04 ENCOUNTER — Inpatient Hospital Stay: Attending: Hematology and Oncology

## 2023-11-04 VITALS — BP 130/82 | HR 61 | Temp 97.8°F | Resp 13 | Wt 182.9 lb

## 2023-11-04 DIAGNOSIS — C4492 Squamous cell carcinoma of skin, unspecified: Secondary | ICD-10-CM | POA: Diagnosis not present

## 2023-11-04 DIAGNOSIS — C7951 Secondary malignant neoplasm of bone: Secondary | ICD-10-CM

## 2023-11-04 DIAGNOSIS — Z79620 Long term (current) use of immunosuppressive biologic: Secondary | ICD-10-CM | POA: Diagnosis not present

## 2023-11-04 DIAGNOSIS — Z95828 Presence of other vascular implants and grafts: Secondary | ICD-10-CM

## 2023-11-04 DIAGNOSIS — C4441 Basal cell carcinoma of skin of scalp and neck: Secondary | ICD-10-CM

## 2023-11-04 DIAGNOSIS — Z8546 Personal history of malignant neoplasm of prostate: Secondary | ICD-10-CM | POA: Diagnosis not present

## 2023-11-04 DIAGNOSIS — Z5112 Encounter for antineoplastic immunotherapy: Secondary | ICD-10-CM

## 2023-11-04 LAB — CMP (CANCER CENTER ONLY)
ALT: 18 U/L (ref 0–44)
AST: 20 U/L (ref 15–41)
Albumin: 4.1 g/dL (ref 3.5–5.0)
Alkaline Phosphatase: 69 U/L (ref 38–126)
Anion gap: 5 (ref 5–15)
BUN: 19 mg/dL (ref 8–23)
CO2: 27 mmol/L (ref 22–32)
Calcium: 9.7 mg/dL (ref 8.9–10.3)
Chloride: 109 mmol/L (ref 98–111)
Creatinine: 1.27 mg/dL — ABNORMAL HIGH (ref 0.61–1.24)
GFR, Estimated: 54 mL/min — ABNORMAL LOW (ref >60)
Glucose, Bld: 106 mg/dL — ABNORMAL HIGH (ref 70–99)
Potassium: 4.1 mmol/L (ref 3.5–5.1)
Sodium: 141 mmol/L (ref 135–145)
Total Bilirubin: 0.6 mg/dL (ref 0.0–1.2)
Total Protein: 6.5 g/dL (ref 6.5–8.1)

## 2023-11-04 LAB — CBC WITH DIFFERENTIAL (CANCER CENTER ONLY)
Abs Immature Granulocytes: 0.02 K/uL (ref 0.00–0.07)
Basophils Absolute: 0 K/uL (ref 0.0–0.1)
Basophils Relative: 0 %
Eosinophils Absolute: 0.2 K/uL (ref 0.0–0.5)
Eosinophils Relative: 4 %
HCT: 41.3 % (ref 39.0–52.0)
Hemoglobin: 14.7 g/dL (ref 13.0–17.0)
Immature Granulocytes: 0 %
Lymphocytes Relative: 18 %
Lymphs Abs: 1.1 K/uL (ref 0.7–4.0)
MCH: 32.2 pg (ref 26.0–34.0)
MCHC: 35.6 g/dL (ref 30.0–36.0)
MCV: 90.6 fL (ref 80.0–100.0)
Monocytes Absolute: 0.7 K/uL (ref 0.1–1.0)
Monocytes Relative: 11 %
Neutro Abs: 3.8 K/uL (ref 1.7–7.7)
Neutrophils Relative %: 67 %
Platelet Count: 212 K/uL (ref 150–400)
RBC: 4.56 MIL/uL (ref 4.22–5.81)
RDW: 13.4 % (ref 11.5–15.5)
WBC Count: 5.8 K/uL (ref 4.0–10.5)
nRBC: 0 % (ref 0.0–0.2)

## 2023-11-04 LAB — TSH: TSH: 4.71 u[IU]/mL — ABNORMAL HIGH (ref 0.350–4.500)

## 2023-11-04 MED ORDER — SODIUM CHLORIDE 0.9 % IV SOLN: Freq: Once | INTRAVENOUS | Status: AC

## 2023-11-04 MED ORDER — SODIUM CHLORIDE 0.9 % IV SOLN
350.0000 mg | Freq: Once | INTRAVENOUS | Status: AC
  Administered 2023-11-04: 350 mg via INTRAVENOUS
  Filled 2023-11-04: qty 7

## 2023-11-04 MED ORDER — LIDOCAINE-PRILOCAINE 2.5-2.5 % EX CREA: 1.0000 | TOPICAL_CREAM | CUTANEOUS | 0 refills | Status: AC | PRN

## 2023-11-04 NOTE — Patient Instructions (Signed)
 [facility] - A DEPT OF [facility]  Discharge Instructions: Thank you for choosing [facility] to provide your oncology and hematology care.   If you have a lab appointment with the Cancer Center, please go directly to the Cancer Center and check in at the registration area.   Wear comfortable clothing and clothing appropriate for easy access to any Portacath or PICC line.   We strive to give you quality time with your provider. You may need to reschedule your appointment if you arrive late (15 or more minutes).  Arriving late affects you and other patients whose appointments are after yours.  Also, if you miss three or more appointments without notifying the office, you may be dismissed from the clinic at the provider's discretion.      For prescription refill requests, have your pharmacy contact our office and allow 72 hours for refills to be completed.    Today you received the following chemotherapy and/or immunotherapy agents Libtayo  To help prevent nausea and vomiting after your treatment, we encourage you to take your nausea medication as directed.  BELOW ARE SYMPTOMS THAT SHOULD BE REPORTED IMMEDIATELY: *FEVER GREATER THAN 100.4 F (38 C) OR HIGHER *CHILLS OR SWEATING *NAUSEA AND VOMITING THAT IS NOT CONTROLLED WITH YOUR NAUSEA MEDICATION *UNUSUAL SHORTNESS OF BREATH *UNUSUAL BRUISING OR BLEEDING *URINARY PROBLEMS (pain or burning when urinating, or frequent urination) *BOWEL PROBLEMS (unusual diarrhea, constipation, pain near the anus) TENDERNESS IN MOUTH AND THROAT WITH OR WITHOUT PRESENCE OF ULCERS (sore throat, sores in mouth, or a toothache) UNUSUAL RASH, SWELLING OR PAIN  UNUSUAL VAGINAL DISCHARGE OR ITCHING   Items with * indicate a potential emergency and should be followed up as soon as possible or go to the Emergency Department if any problems should occur.  Please show the CHEMOTHERAPY ALERT CARD or IMMUNOTHERAPY ALERT  CARD at check-in to the Emergency Department and triage nurse.  Should you have questions after your visit or need to cancel or reschedule your appointment, please contact [facility] - [facility] DE[facility]  Dept: [phone]  and follow the prompts.  Office hours are 8:00 a.m. to 4:30 p.m. Monday - Friday. Please note that voicemails left after 4:00 p.m. may not be returned until the following business day.  We are closed weekends and major holidays. You have access to a nurse at all times for urgent questions. Please call the main number to the clinic Dept: [phone] and follow the prompts.   For any non-urgent questions, you may also contact your provider using MyChart. We now offer e-Visits for anyone 59 and older to request care online for non-urgent symptoms. For details visit [email or web address].   Also download the MyChart app! Go to the app store, search MyChart, open the app, select [place], and log in with your MyChart username and password.

## 2023-11-05 LAB — T4: T4, Total: 8.4 ug/dL (ref 4.5–12.0)

## 2023-11-23 NOTE — Progress Notes (Unsigned)
 [facility] [phone]   [phone]  PROGRESS NOTE  Patient Care Team: Yolande Toribio MATSU, MD as PCP - General (Internal Medicine) Watt Rush, MD as Attending Physician (Urology) Izell Domino, MD as Consulting Physician (Radiation Oncology) Malmfelt, Delon CROME, RN as Oncology Nurse Navigator (Oncology) Lynnell Nottingham, MD as Consulting Physician (Dermatology) Federico Rosario BROCKS, MD as Consulting Physician (Gastroenterology) Federico Rush ONEIDA MADISON, MD as Consulting Physician (Hematology and Oncology)  Hematological/Oncological History # Bone Lesions on CT/MRI # 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 Additionally there was noted to be a new enhancing lesion at the tip of the right greater trochanter measuring up to 1.7 cm also suspicious for metastatic disease. 09/27/2020: establish care with Dr. Federico 10/29/2020: CT biopsy performed of left iliac bone lesion. Pathology showed bone and marrow without evidence of carcinoma.  11/11/2022: Cycle 1 Day 1 of cemiplimab  therapy.  12/03/2022: Cycle 2 Day 1 of cemiplimab  therapy.  12/23/2022: Cycle 3 Day 1 of cemiplimab  therapy.  01/13/2023: Cycle 4 Day 1 of cemiplimab  therapy.  02/03/2023: Cycle 5 Day 1 of cemiplimab  therapy.  02/23/2023: Cycle 6 Day 1 of cemiplimab  therapy.  03/16/2023:  Cycle 7 Day 1 of cemiplimab  therapy.  04/06/2023: Cycle 8 Day 1 of cemiplimab  therapy.  04/28/2023: Cycle 9 Day 1 of cemiplimab  therapy.  05/18/2023: Cycle 10 Day 1 of cemiplimab  therapy.  06/08/2023: Cycle 11 Day 1 of cemiplimab  therapy.  06/30/2023: Cycle 12 Day 1 of cemiplimab  therapy.  07/21/2023: Cycle 13 Day 1 of  cemiplimab  therapy.  08/11/2023: Cycle 14 Day 1 of cemplimab therapy.  09/02/2023: Cycle 15 Day 1 of cemiplimab  therapy.  09/23/2023: Cycle 16 Day 1 of cemiplimab  therapy.  10/13/2023: Cycle 17 Day 1 of cemiplimab  therapy.  11/04/2023: Cycle 18 Day 1 of cemiplimab  therapy.  11/24/2023: Cycle 19 Day 1 of cemiplimab  therapy.   Interval History:  [facility] 89 y.o. 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 The patient's last visit was on 11/04/2023. In the interim since the last visit he has completed Cycle 18 of cemiplimab .  On exam today [name] reports he is doing well without any new or concerning symptoms. Due to prior radiation therapy, his ability to chew food is impacted. He compensates by eating small bites throughout his meals. He denies nausea, vomiting or bowel habit changes. He reports drainage from his ear has improved. He denies easy bruising or signs of bleeding. He denies fevers, chills, sweats, shortness of breath, chest pain or cough. He has no other complaints. Rest of the ROS is below.    MEDICAL HISTORY:  Past Medical History:  Diagnosis Date   Atypical nevus 09/07/2002   left abdomen-slight   BCC (basal cell carcinoma of skin) 07/19/2014   left sideburn- +margin-exc.   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 if orignated from skin cancer   Nodular basal cell carcinoma  (BCC) 11/21/2012   right side of scalp-CX35FU   Nodular basal cell carcinoma (BCC) 06/12/2014   left sideburn-CX35FU/exc.   Nodular basal cell carcinoma (BCC) 07/16/2015   left scalp-CX35FU   Polycythemia    Prostate cancer (HCC) 1990's   total prostatectomy and adjuvant radiation. Last PSA was 0.02 in 09/2011   SCC (squamous cell carcinoma) 04/03/2009   forehead-txpbx   SCC (squamous cell carcinoma) 07/16/2015   left front scalp   SCC (squamous cell carcinoma) 07/16/2015   left front scalp-Cx35FU   SCCA (squamous cell carcinoma) of skin 12/20/2019   Right forehead (in situ)   SCCA (squamous cell carcinoma) of skin 05/27/2020   Mid Parietal Scalp (in situ)   Superficial basal cell carcinoma (BCC) 11/21/2012   behind left ear-CX35FU, midback-CX35FU, right forehead-CX35FU-exc   Superficial basal cell carcinoma (BCC) 11/02/2017   left post neck-CX35FU   Superficial nodular basal cell carcinoma (BCC) 12/20/2019   Left temporal scalp   Superficial nodular basal cell carcinoma (BCC) 05/27/2020   Right Preauricular area (curet and 5FU)    SURGICAL HISTORY: Past Surgical History:  Procedure Laterality Date   ADJACENT TISSUE TRANSFER/TISSUE REARRANGEMENT N/A 05/22/2019   Procedure: COMPLEX CLOSURE OF NECK WOUND;  Surgeon: Elisabeth Craig RAMAN, MD;  Location: [facility] OR;  Service: Plastics;  Laterality: N/A;   ALLOGRAFT APPLICATION N/A 05/22/2019   Procedure: POSSIBLE FACIAL NERVE RECONSTRUCTION WITH NERVE ALLOGRAFT VS AUTOGRAFT;  Surgeon: Elisabeth Craig RAMAN, MD;  Location: [facility] OR;  Service: Plastics;  Laterality: N/A;   BLADDER SURGERY     due to prostatectomy.    CATARACT EXTRACTION     right   COLONOSCOPY     neg in the past; due in 2014 with [place] Dr. Jakie   EYE SURGERY     1950   HERNIA REPAIR     IR IMAGING GUIDED PORT INSERTION  11/30/2022   MOHS SURGERY     NECK DISSECTION  05/22/2019    NECK DISSECTION   PAROTIDECTOMY  05/22/2019   PAROTIDECTOMY with facial nerve dissection     PAROTIDECTOMY Right 05/22/2019   Procedure: PAROTIDECTOMY;  Surgeon: Jesus Oliphant, MD;  [facility];  Service: ENT;  Laterality: Right;   PROSTATE SURGERY     RADICAL NECK DISSECTION Right 05/22/2019   Procedure: RADICAL NECK DISSECTION;  Surgeon: Jesus Oliphant, MD;  Location: [facility] OR;  Service: ENT;  Laterality: Right;   TONSILLECTOMY      SOCIAL HISTORY: Social History   Socioeconomic History   Marital status: Married    Spouse name: Not on file   Number of children: 3   Years of education: Not on file   Highest education level: Not on file  Occupational History    Comment: retired Scientist, [employer]; 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   Occupation: retired  Tobacco Use   Smoking status: Never   Smokeless tobacco: Never  Vaping Use   Vaping status: Never Used  Substance and Sexual Activity   Alcohol  use: No   Drug use: No   Sexual activity: Not on file  Other Topics Concern   Not on file  Social History Narrative  Not on file   Social Drivers of Health   Financial Resource Strain: Not on file  Food Insecurity: No Food Insecurity (10/29/2022)   Hunger Vital Sign    Worried About Running Out of Food in the Last Year: Never true    Ran Out of Food in the Last Year: Never true  Transportation Needs: No Transportation Needs (10/29/2022)   PRAPARE - Administrator, Civil Service (Medical): No    Lack of Transportation (Non-Medical): No  Physical Activity: Not on file  Stress: Not on file  Social Connections: Not on file  Intimate Partner Violence: Not At Risk (10/29/2022)   Humiliation, Afraid, Rape, and Kick questionnaire    Fear of Current or Ex-Partner: No    Emotionally Abused: No    Physically Abused: No    Sexually Abused: No    FAMILY HISTORY: Family History  Problem Relation Age of Onset   Uterine cancer Mother 32   Ovarian cancer Mother    Lung cancer Father 47   Prostate cancer Brother    Prostate cancer Brother    Colon cancer Neg Hx    Stomach cancer Neg  Hx    Esophageal cancer Neg Hx    Rectal cancer Neg Hx     ALLERGIES:  is allergic to methylprednisolone, penicillins, latex, and tape.  MEDICATIONS:  Current Outpatient Medications  Medication Sig Dispense Refill   amLODipine  (NORVASC ) 5 MG tablet Take 5 mg by mouth daily.      aspirin  81 MG tablet Take 81 mg by mouth daily.     benazepril  (LOTENSIN ) 10 MG tablet Take 10 mg by mouth daily.     famotidine -calcium carbonate-magnesium hydroxide (PEPCID  COMPLETE) 10-800-165 MG chewable tablet Take one tablet PRN     levothyroxine  (SYNTHROID ) 25 MCG tablet Take 25 mcg by mouth daily before breakfast. 2 tABS M,W,F and one the other days     lidocaine -prilocaine  (EMLA ) cream Apply 1 Application topically as needed. 30 g 0   Multiple Vitamin (MULTIVITAMIN) tablet Take 1 tablet by mouth daily.     ondansetron  (ZOFRAN ) 8 MG tablet Take 1 tablet (8 mg total) by mouth every 8 (eight) hours as needed for nausea or vomiting. 20 tablet 1   simvastatin  (ZOCOR ) 20 MG tablet Take 20 mg by mouth every evening.     potassium citrate  (UROCIT-K ) 10 MEQ (1080 MG) SR tablet Take 10 mEq by mouth 3 (three) times daily with meals. (Patient not taking: Reported on 11/24/2023)     No current facility-administered medications for this visit.    REVIEW OF SYSTEMS:   Constitutional: ( - ) fevers, ( - )  chills , ( - ) night sweats Eyes: ( - ) blurriness of vision, ( - ) double vision, ( - ) watery eyes Ears, nose, mouth, throat, and face: ( - ) mucositis, ( - ) sore throat Respiratory: ( - ) cough, ( - ) dyspnea, ( - ) wheezes Cardiovascular: ( - ) palpitation, ( - ) chest discomfort, ( - ) lower extremity swelling Gastrointestinal:  ( - ) nausea, ( - ) heartburn, ( - ) change in bowel habits Skin: ( - ) abnormal skin rashes Lymphatics: ( - ) new lymphadenopathy, ( - ) easy bruising Neurological: ( - ) numbness, ( - ) tingling, ( - ) new weaknesses Behavioral/Psych: ( - ) mood change, ( - ) new changes  All other  systems were reviewed with the patient and are negative.  PHYSICAL EXAMINATION:  ECOG PERFORMANCE STATUS: 1 - Symptomatic but completely ambulatory  Vitals:   11/24/23 1133  BP: 133/63  Pulse: 68  Resp: 13  Temp: (!) 97 F (36.1 C)  SpO2: 97%     Filed Weights   11/24/23 1133  Weight: 180 lb 14.4 oz (82.1 kg)     GENERAL: Well-appearing elderly Caucasian male, alert, no distress and comfortable SKIN: skin color, texture, turgor are normal, no rashes or significant lesions EYES: conjunctiva are pink and non-injected, sclera clear LUNGS: clear to auscultation and percussion with normal breathing effort HEART: regular rate & rhythm and no murmurs and no lower extremity edema Musculoskeletal: no cyanosis of digits and no clubbing  PSYCH: alert & oriented x 3, fluent speech NEURO: no focal motor/sensory deficits  LABORATORY DATA:  I have reviewed the data as listed    Latest Ref Rng & Units 11/24/2023   11:07 AM 11/04/2023    7:53 AM 10/13/2023   10:18 AM  CBC  WBC 4.0 - 10.5 K/uL 7.2  5.8  6.5   Hemoglobin 13.0 - 17.0 g/dL 84.0  85.2  84.7   Hematocrit 39.0 - 52.0 % 44.7  41.3  43.4   Platelets 150 - 400 K/uL 203  212  205        Latest Ref Rng & Units 11/24/2023   11:07 AM 11/04/2023    7:53 AM 10/13/2023   10:18 AM  CMP  Glucose 70 - 99 mg/dL 842  893  97   BUN 8 - 23 mg/dL 17  19  17    Creatinine 0.61 - 1.24 mg/dL 8.64  8.72  8.81   Sodium 135 - 145 mmol/L 140  141  140   Potassium 3.5 - 5.1 mmol/L 4.0  4.1  4.1   Chloride 98 - 111 mmol/L 108  109  109   CO2 22 - 32 mmol/L 27  27  27    Calcium 8.9 - 10.3 mg/dL 9.8  9.7  9.5   Total Protein 6.5 - 8.1 g/dL 6.8  6.5  6.7   Total Bilirubin 0.0 - 1.2 mg/dL 0.7  0.6  0.7   Alkaline Phos 38 - 126 U/L 65  69  66   AST 15 - 41 U/L 21  20  19    ALT 0 - 44 U/L 20  18  17      Lab Results  Component Value Date   MPROTEIN Not Observed 09/27/2020   Lab Results  Component Value Date   KPAFRELGTCHN 25.6 (H) 09/27/2020    LAMBDASER 23.0 09/27/2020   KAPLAMBRATIO 1.11 09/27/2020    RADIOGRAPHIC STUDIES: NM PET Image Restage (PS) Whole Body Result Date: 11/01/2023 CLINICAL DATA:  Subsequent treatment strategy for metastatic squamous cell carcinoma of the skin. EXAM: NUCLEAR MEDICINE PET WHOLE BODY TECHNIQUE: 9.07 mCi F-18 FDG was injected intravenously. Full-ring PET imaging was performed from the head to foot after the radiotracer. CT data was obtained and used for attenuation correction and anatomic localization. Fasting blood glucose: 83 mg/dl COMPARISON:  Multiple prior PET CTs.  The most recent is 07/30/2023. FINDINGS: Mediastinal blood pool activity: SUV max 2.7 HEAD/NECK: Stable surgical changes from a radical posterior neck dissections right side. Stable destructive bony changes involving the right skull base including the right mastoid bone and occiput and C1. Mild residual hypermetabolism in the soft tissues posteriorly with SUV max of 4.3. This was previously 4.7. No hypermetabolic cervical lymph nodes. Incidental CT findings: Stable bilateral carotid artery calcifications. CHEST: Persistent mediastinal  and hilar adenopathy. No significant interval change in size or hypermetabolism. Right hilar node has an SUV max of 8.3 and was previously 8.9. Left hilar node has an SUV max of 8.7 and was previously 7.7. Right-sided subcarinal node has an SUV max of 11.3 and was previously 10.9. No hypermetabolic pulmonary lesions or worrisome pulmonary nodules on the chest CT. No supraclavicular or axillary adenopathy. Incidental CT findings: Stable small hiatal hernia. Stable ascending thoracic aortic aneurysm, atherosclerotic calcifications and three-vessel coronary artery disease. ABDOMEN/PELVIS: No abnormal hypermetabolic activity within the liver, pancreas, adrenal glands, or spleen. No hypermetabolic lymph nodes in the abdomen or pelvis. Incidental CT findings: Stable aortic and branch vessel calcifications. 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 Stable simple renal cysts. Stable colonic diverticulosis. Status post prostatectomy. SKELETON: The lytic hypermetabolic bone lesion involving the left iliac bone is stable in size. SUV max is 8.8 and was previously 6.0. New sclerotic 6 mm hypermetabolic lesion involving the right sacrum has an SUV max of 5.9. No other hypermetabolic bone lesions are identified. Incidental CT findings: Stable numerous sclerotic bone lesions without hypermetabolism mainly involving the spine. EXTREMITIES: No abnormal hypermetabolic activity in the lower extremities. Incidental CT findings: none IMPRESSION: 1. Stable surgical changes from a radical posterior neck dissection on the right side. Stable destructive bony changes involving the right skull base and upper cervical spine. Mild stable residual hypermetabolism in the soft tissues posteriorly. No new or progressive findings. No cervical adenopathy. 2. Stable sized hypermetabolic mediastinal and hilar lymph nodes with slight interval increase in hypermetabolism. No new disease. 3. Stable sized lytic hypermetabolic bone lesion involving the left iliac bone with slight progression of hypermetabolism. New hypermetabolic sclerotic lesion involving the right sacrum. 4. Stable numerous sclerotic bone lesions without hypermetabolism. 5. No findings for metastatic disease involving the abdomen/pelvis or extremities. 6. Aortic atherosclerosis. Aortic Atherosclerosis (ICD10-I70.0). Electronically Signed   By: MYRTIS Stammer [name]   On: 11/01/2023 17:53      ASSESSMENT & PLAN BAILEY KOLBE is a 89 y.o. 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 Previously  discussed with radiology who notes a repeat biopsy was not recommended and repeat imaging would be preferred.  --negative multiple myeloma labs with SPEP and serum free light chains showing no evidence of monoclonal gammopathy.  --Given his prior history of prostate cancer we ordered a PSA, which was normal. Repeated PSA previously, found to be within normal limits --CT C/A/P shows no evidence of disease elsewhere in the body in August 2022. Most recent PET CT scan on 10/02/2022 showed hypermetabolic osseous metastasis with a dominant mass centered about the inferior medial portion of the right mastoid, suspicious for site of primary squamous cell carcinoma. -- PET CT scan shows evidence of lesion behind the right ear as well as the known pelvic metastasis. -- Reviewed tissue with pathology to determine if both lesions are the same pathology or different origins.  At this time findings are most consistent with a basal cell primary behind his ear and squamous cell carcinoma of the pelvis.  It is possible these at the same pathology with the basal/squamous crossover, though they may represent separate primaries.  Either way Cemiplimab  therapy should be adequate to treat  both. -- PD-L1 testing showed 0% PD-L1 score, however the use of cemiplimab  does not require PD-L1 positivity (lack of PD-L1 prevents the use of pembrolizumab)  PLAN: --Proceed with cycle 19, day 1 of Cemiplimab  immunotherapy today --Labs today show WBC 7.2, Hgb 15.9, Plt 203, creatinine 1.35, LFTs normal.  --Most recent PET CT scan from 11/01/2023 showed overall stable disease with a mild increase in FDG avidity in hip lesion. New 6 mm FDG avid hip lesion. Next scan in Dec 2025. --Proceed with treatment today without any modifications.  -- Return to clinic for next cycle of cemiplimab  in 3 weeks time.   # Bleeding of the Ear--resolved # Drainage from Ear-- mild  -- secondary due to erosion of the tumor into his ear canal. -- wound has  healed without evidence of bleeding.  -- Orange color discharge from the ear  at this time -- monitor for now.   # FDG Avid Colon Lesion --Patient underwent colonoscopy with gastroenterology and found to have large polyps. -- No evidence of colonic malignancy.  No orders of the defined types were placed in this encounter.   All questions were answered. The patient knows to call the clinic with any problems, questions or concerns.   I have spent a total of 30 minutes minutes of face-to-face and non-face-to-face time, preparing to see the patient, performing a medically appropriate examination, counseling and educating the patient, ordering medications/tests/procedures, documenting clinical information in the electronic health record, independently interpreting results and communicating results to the patient, and care coordination.   Johnston Police PA-C Dept of Hematology and Oncology [facility] Phone: [phone]    11/24/2023 9:46 PM

## 2023-11-24 ENCOUNTER — Inpatient Hospital Stay (HOSPITAL_BASED_OUTPATIENT_CLINIC_OR_DEPARTMENT_OTHER): Admitting: Physician Assistant

## 2023-11-24 ENCOUNTER — Inpatient Hospital Stay: Attending: Hematology and Oncology

## 2023-11-24 VITALS — BP 133/63 | HR 68 | Temp 97.0°F | Resp 13 | Wt 180.9 lb

## 2023-11-24 DIAGNOSIS — Z5112 Encounter for antineoplastic immunotherapy: Secondary | ICD-10-CM

## 2023-11-24 DIAGNOSIS — C4441 Basal cell carcinoma of skin of scalp and neck: Secondary | ICD-10-CM | POA: Diagnosis not present

## 2023-11-24 DIAGNOSIS — Z79620 Long term (current) use of immunosuppressive biologic: Secondary | ICD-10-CM | POA: Diagnosis not present

## 2023-11-24 DIAGNOSIS — C7951 Secondary malignant neoplasm of bone: Secondary | ICD-10-CM

## 2023-11-24 DIAGNOSIS — C4492 Squamous cell carcinoma of skin, unspecified: Secondary | ICD-10-CM

## 2023-11-24 LAB — CMP (CANCER CENTER ONLY)
ALT: 20 U/L (ref 0–44)
AST: 21 U/L (ref 15–41)
Albumin: 4.3 g/dL (ref 3.5–5.0)
Alkaline Phosphatase: 65 U/L (ref 38–126)
Anion gap: 5 (ref 5–15)
BUN: 17 mg/dL (ref 8–23)
CO2: 27 mmol/L (ref 22–32)
Calcium: 9.8 mg/dL (ref 8.9–10.3)
Chloride: 108 mmol/L (ref 98–111)
Creatinine: 1.35 mg/dL — ABNORMAL HIGH (ref 0.61–1.24)
GFR, Estimated: 50 mL/min — ABNORMAL LOW (ref >60)
Glucose, Bld: 157 mg/dL — ABNORMAL HIGH (ref 70–99)
Potassium: 4 mmol/L (ref 3.5–5.1)
Sodium: 140 mmol/L (ref 135–145)
Total Bilirubin: 0.7 mg/dL (ref 0.0–1.2)
Total Protein: 6.8 g/dL (ref 6.5–8.1)

## 2023-11-24 LAB — CBC WITH DIFFERENTIAL (CANCER CENTER ONLY)
Abs Immature Granulocytes: 0.02 K/uL (ref 0.00–0.07)
Basophils Absolute: 0 K/uL (ref 0.0–0.1)
Basophils Relative: 1 %
Eosinophils Absolute: 0.1 K/uL (ref 0.0–0.5)
Eosinophils Relative: 2 %
HCT: 44.7 % (ref 39.0–52.0)
Hemoglobin: 15.9 g/dL (ref 13.0–17.0)
Immature Granulocytes: 0 %
Lymphocytes Relative: 14 %
Lymphs Abs: 1 K/uL (ref 0.7–4.0)
MCH: 31.9 pg (ref 26.0–34.0)
MCHC: 35.6 g/dL (ref 30.0–36.0)
MCV: 89.6 fL (ref 80.0–100.0)
Monocytes Absolute: 0.5 K/uL (ref 0.1–1.0)
Monocytes Relative: 8 %
Neutro Abs: 5.4 K/uL (ref 1.7–7.7)
Neutrophils Relative %: 75 %
Platelet Count: 203 K/uL (ref 150–400)
RBC: 4.99 MIL/uL (ref 4.22–5.81)
RDW: 13.4 % (ref 11.5–15.5)
WBC Count: 7.2 K/uL (ref 4.0–10.5)
nRBC: 0 % (ref 0.0–0.2)

## 2023-11-24 LAB — TSH: TSH: 3.38 u[IU]/mL (ref 0.350–4.500)

## 2023-11-24 MED ORDER — SODIUM CHLORIDE 0.9 % IV SOLN
350.0000 mg | Freq: Once | INTRAVENOUS | Status: AC
  Administered 2023-11-24: 350 mg via INTRAVENOUS
  Filled 2023-11-24: qty 7

## 2023-11-24 MED ORDER — SODIUM CHLORIDE 0.9 % IV SOLN: Freq: Once | INTRAVENOUS | Status: AC

## 2023-11-24 NOTE — Patient Instructions (Signed)
 [facility] - A DEPT OF [facility]  Discharge Instructions: Thank you for choosing [facility] to provide your oncology and hematology care.   If you have a lab appointment with the Cancer Center, please go directly to the Cancer Center and check in at the registration area.   Wear comfortable clothing and clothing appropriate for easy access to any Portacath or PICC line.   We strive to give you quality time with your provider. You may need to reschedule your appointment if you arrive late (15 or more minutes).  Arriving late affects you and other patients whose appointments are after yours.  Also, if you miss three or more appointments without notifying the office, you may be dismissed from the clinic at the provider's discretion.      For prescription refill requests, have your pharmacy contact our office and allow 72 hours for refills to be completed.    Today you received the following chemotherapy and/or immunotherapy agents Libtayo  To help prevent nausea and vomiting after your treatment, we encourage you to take your nausea medication as directed.  BELOW ARE SYMPTOMS THAT SHOULD BE REPORTED IMMEDIATELY: *FEVER GREATER THAN 100.4 F (38 C) OR HIGHER *CHILLS OR SWEATING *NAUSEA AND VOMITING THAT IS NOT CONTROLLED WITH YOUR NAUSEA MEDICATION *UNUSUAL SHORTNESS OF BREATH *UNUSUAL BRUISING OR BLEEDING *URINARY PROBLEMS (pain or burning when urinating, or frequent urination) *BOWEL PROBLEMS (unusual diarrhea, constipation, pain near the anus) TENDERNESS IN MOUTH AND THROAT WITH OR WITHOUT PRESENCE OF ULCERS (sore throat, sores in mouth, or a toothache) UNUSUAL RASH, SWELLING OR PAIN  UNUSUAL VAGINAL DISCHARGE OR ITCHING   Items with * indicate a potential emergency and should be followed up as soon as possible or go to the Emergency Department if any problems should occur.  Please show the CHEMOTHERAPY ALERT CARD or IMMUNOTHERAPY ALERT  CARD at check-in to the Emergency Department and triage nurse.  Should you have questions after your visit or need to cancel or reschedule your appointment, please contact [facility] - [facility] DE[facility]  Dept: [phone]  and follow the prompts.  Office hours are 8:00 a.m. to 4:30 p.m. Monday - Friday. Please note that voicemails left after 4:00 p.m. may not be returned until the following business day.  We are closed weekends and major holidays. You have access to a nurse at all times for urgent questions. Please call the main number to the clinic Dept: [phone] and follow the prompts.   For any non-urgent questions, you may also contact your provider using MyChart. We now offer e-Visits for anyone 59 and older to request care online for non-urgent symptoms. For details visit [email or web address].   Also download the MyChart app! Go to the app store, search MyChart, open the app, select [place], and log in with your MyChart username and password.

## 2023-11-25 LAB — T4: T4, Total: 9.1 ug/dL (ref 4.5–12.0)

## 2023-11-29 ENCOUNTER — Ambulatory Visit: Payer: Self-pay | Admitting: Physician Assistant

## 2023-12-14 NOTE — Progress Notes (Unsigned)
 [facility] [phone]   [phone]  PROGRESS NOTE  Patient Care Team: Yolande Toribio MATSU, MD as PCP - General (Internal Medicine) Watt Rush, MD as Attending Physician (Urology) Izell Domino, MD as Consulting Physician (Radiation Oncology) Malmfelt, Delon CROME, RN as Oncology Nurse Navigator (Oncology) Lynnell Nottingham, MD as Consulting Physician (Dermatology) Federico Rosario BROCKS, MD as Consulting Physician (Gastroenterology) Federico Rush ONEIDA MADISON, MD as Consulting Physician (Hematology and Oncology)  Hematological/Oncological History # Bone Lesions on CT/MRI # 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 Additionally there was noted to be a new enhancing lesion at the tip of the right greater trochanter measuring up to 1.7 cm also suspicious for metastatic disease. 09/27/2020: establish care with Dr. Federico 10/29/2020: CT biopsy performed of left iliac bone lesion. Pathology showed bone and marrow without evidence of carcinoma.  11/11/2022: Cycle 1 Day 1 of cemiplimab  therapy.  12/03/2022: Cycle 2 Day 1 of cemiplimab  therapy.  12/23/2022: Cycle 3 Day 1 of cemiplimab  therapy.  01/13/2023: Cycle 4 Day 1 of cemiplimab  therapy.  02/03/2023: Cycle 5 Day 1 of cemiplimab  therapy.  02/23/2023: Cycle 6 Day 1 of cemiplimab  therapy.  03/16/2023:  Cycle 7 Day 1 of cemiplimab  therapy.  04/06/2023: Cycle 8 Day 1 of cemiplimab  therapy.  04/28/2023: Cycle 9 Day 1 of cemiplimab  therapy.  05/18/2023: Cycle 10 Day 1 of cemiplimab  therapy.  06/08/2023: Cycle 11 Day 1 of cemiplimab  therapy.  06/30/2023: Cycle 12 Day 1 of cemiplimab  therapy.  07/21/2023: Cycle 13 Day 1 of  cemiplimab  therapy.  08/11/2023: Cycle 14 Day 1 of cemplimab therapy.  09/02/2023: Cycle 15 Day 1 of cemiplimab  therapy.  09/23/2023: Cycle 16 Day 1 of cemiplimab  therapy.  10/13/2023: Cycle 17 Day 1 of cemiplimab  therapy.  11/04/2023: Cycle 18 Day 1 of cemiplimab  therapy.   Interval History:  Martin Curry 89 y.o. 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 The patient's last visit was on 11/24/2023. In the interim since the last visit he has completed Cycle 19 of cemiplimab .  On exam today Martin Curry reports he has been well overall in the interim since our last visit has had no issues.  He reports he tolerated the treatment well and is only having some fatigue.  He reports he had 1 day of loose stools that he would not quite call diarrhea.  He is not having any nausea or vomiting.  He reports the ear is running on and off.  He does continue to have orange or yellowish discharge frequently the color of rust.  He notes he is not having any bloody discharge.  He notes his major symptom has been laziness.  He notes his mouth has not changed much and he still has a good appetite.  He is not having any bone or back pain but does have his usual aches and pains from being older.  He notes that he got his flu shot and is in the process of getting his COVID and RSV.  He otherwise denies any fevers, chills, sweats, nausea, vomiting or diarrhea.  He is willing and able to proceed  with treatment today.  A full 10 point ROS is otherwise negative.   MEDICAL HISTORY:  Past Medical History:  Diagnosis Date   Atypical nevus 09/07/2002   left abdomen-slight   BCC (basal cell carcinoma of skin) 07/19/2014   left sideburn- +margin-exc.   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 if orignated from skin cancer   Nodular basal cell carcinoma (BCC) 11/21/2012   right side of scalp-CX35FU   Nodular basal cell carcinoma (BCC) 06/12/2014   left sideburn-CX35FU/exc.   Nodular basal cell carcinoma (BCC) 07/16/2015   left scalp-CX35FU   Polycythemia    Prostate cancer (HCC) 1990's   total prostatectomy and adjuvant radiation. Last PSA was 0.02 in 09/2011   SCC (squamous cell carcinoma) 04/03/2009   forehead-txpbx   SCC (squamous cell carcinoma) 07/16/2015   left front scalp   SCC (squamous cell carcinoma) 07/16/2015   left front scalp-Cx35FU   SCCA (squamous cell carcinoma) of skin 12/20/2019   Right forehead (in situ)   SCCA (squamous cell carcinoma) of skin 05/27/2020   Mid Parietal Scalp (in situ)   Superficial basal cell carcinoma (BCC) 11/21/2012   behind left ear-CX35FU, midback-CX35FU, right forehead-CX35FU-exc   Superficial basal cell carcinoma (BCC) 11/02/2017   left post neck-CX35FU   Superficial nodular basal cell carcinoma (BCC) 12/20/2019   Left temporal scalp   Superficial nodular basal cell carcinoma (BCC) 05/27/2020   Right Preauricular area (curet and 5FU)    SURGICAL HISTORY: Past Surgical History:  Procedure Laterality Date   ADJACENT TISSUE TRANSFER/TISSUE REARRANGEMENT N/A 05/22/2019   Procedure: COMPLEX CLOSURE OF NECK WOUND;  Surgeon: Elisabeth Craig RAMAN, MD;  Location: [facility] OR;  Service: Plastics;  Laterality: N/A;   ALLOGRAFT APPLICATION N/A 05/22/2019   Procedure: POSSIBLE FACIAL NERVE RECONSTRUCTION WITH NERVE ALLOGRAFT VS AUTOGRAFT;  Surgeon: Elisabeth Craig RAMAN, MD;   Location: [facility] OR;  Service: Plastics;  Laterality: N/A;   BLADDER SURGERY     due to prostatectomy.    CATARACT EXTRACTION     right   COLONOSCOPY     neg in the past; due in 2014 with [place] Dr. Jakie   EYE SURGERY     1950   HERNIA REPAIR     IR IMAGING GUIDED PORT INSERTION  11/30/2022   MOHS SURGERY     NECK DISSECTION  05/22/2019    NECK DISSECTION   PAROTIDECTOMY  05/22/2019   PAROTIDECTOMY with facial nerve dissection    PAROTIDECTOMY Right 05/22/2019   Procedure: PAROTIDECTOMY;  Surgeon: Jesus Oliphant, MD;  Location: [facility] OR;  Service: ENT;  Laterality: Right;   PROSTATE SURGERY     RADICAL NECK DISSECTION Right 05/22/2019   Procedure: RADICAL NECK DISSECTION;  Surgeon: Jesus Oliphant, MD;  Location: [facility] OR;  Service: ENT;  Laterality: Right;   TONSILLECTOMY      SOCIAL HISTORY: Social History   Socioeconomic History   Marital status: Married  Spouse name: Not on file   Number of children: 3   Years of education: Not on file   Highest education level: Not on file  Occupational History    Comment: retired Scientist, 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   Occupation: retired  Tobacco Use   Smoking status: Never   Smokeless tobacco: Never  Vaping Use   Vaping status: Never Used  Substance and Sexual Activity   Alcohol  use: No   Drug use: No   Sexual activity: Not on file  Other Topics Concern   Not on file  Social History Narrative   Not on file   Social Drivers of Health   Financial Resource Strain: Not on file  Food Insecurity: No Food Insecurity (10/29/2022)   Hunger Vital Sign    Worried About Running Out of Food in the Last Year: Never true    Ran Out of Food in the Last Year: Never true  Transportation Needs: No Transportation Needs (10/29/2022)   PRAPARE - Administrator, Civil Service (Medical): No    Lack of Transportation (Non-Medical): No  Physical Activity: Not on file  Stress: Not on file  Social Connections: Not on file  Intimate Partner  Violence: Not At Risk (10/29/2022)   Humiliation, Afraid, Rape, and Kick questionnaire    Fear of Current or Ex-Partner: No    Emotionally Abused: No    Physically Abused: No    Sexually Abused: No    FAMILY HISTORY: Family History  Problem Relation Age of Onset   Uterine cancer Mother 33   Ovarian cancer Mother    Lung cancer Father 32   Prostate cancer Brother    Prostate cancer Brother    Colon cancer Neg Hx    Stomach cancer Neg Hx    Esophageal cancer Neg Hx    Rectal cancer Neg Hx     ALLERGIES:  is allergic to methylprednisolone, penicillins, latex, and tape.  MEDICATIONS:  Current Outpatient Medications  Medication Sig Dispense Refill   amLODipine  (NORVASC ) 5 MG tablet Take 5 mg by mouth daily.      aspirin  81 MG tablet Take 81 mg by mouth daily.     benazepril  (LOTENSIN ) 10 MG tablet Take 10 mg by mouth daily.     famotidine -calcium carbonate-magnesium hydroxide (PEPCID  COMPLETE) 10-800-165 MG chewable tablet Take one tablet PRN     levothyroxine  (SYNTHROID ) 25 MCG tablet Take 25 mcg by mouth daily before breakfast. 2 tABS M,W,F and one the other days     lidocaine -prilocaine  (EMLA ) cream Apply 1 Application topically as needed. 30 g 0   Multiple Vitamin (MULTIVITAMIN) tablet Take 1 tablet by mouth daily.     ondansetron  (ZOFRAN ) 8 MG tablet Take 1 tablet (8 mg total) by mouth every 8 (eight) hours as needed for nausea or vomiting. 20 tablet 1   potassium citrate  (UROCIT-K ) 10 MEQ (1080 MG) SR tablet Take 10 mEq by mouth 3 (three) times daily with meals. (Patient not taking: Reported on 11/24/2023)     simvastatin  (ZOCOR ) 20 MG tablet Take 20 mg by mouth every evening.     No current facility-administered medications for this visit.    REVIEW OF SYSTEMS:   Constitutional: ( - ) fevers, ( - )  chills , ( - ) night sweats Eyes: ( - ) blurriness of vision, ( - ) double vision, ( - ) watery eyes Ears, nose, mouth, throat, and face: ( - ) mucositis, ( - ) sore  throat Respiratory: ( - )  cough, ( - ) dyspnea, ( - ) wheezes Cardiovascular: ( - ) palpitation, ( - ) chest discomfort, ( - ) lower extremity swelling Gastrointestinal:  ( - ) nausea, ( - ) heartburn, ( - ) change in bowel habits Skin: ( - ) abnormal skin rashes Lymphatics: ( - ) new lymphadenopathy, ( - ) easy bruising Neurological: ( - ) numbness, ( - ) tingling, ( - ) new weaknesses Behavioral/Psych: ( - ) mood change, ( - ) new changes  All other systems were reviewed with the patient and are negative.  PHYSICAL EXAMINATION: ECOG PERFORMANCE STATUS: 1 - Symptomatic but completely ambulatory  Vitals:   12/15/23 0843  BP: 122/72  Pulse: (!) 56  Resp: 14  Temp: 98.2 F (36.8 C)  SpO2: 96%         Filed Weights   12/15/23 0843  Weight: 182 lb 1.6 oz (82.6 kg)          GENERAL: Well-appearing elderly Caucasian male, alert, no distress and comfortable SKIN: skin color, texture, turgor are normal, no rashes or significant lesions EYES: conjunctiva are pink and non-injected, sclera clear EAR: Orange/red colored drainage from inner ear.  LUNGS: clear to auscultation and percussion with normal breathing effort HEART: regular rate & rhythm and no murmurs and no lower extremity edema Musculoskeletal: no cyanosis of digits and no clubbing  PSYCH: alert & oriented x 3, fluent speech NEURO: no focal motor/sensory deficits  LABORATORY DATA:  I have reviewed the data as listed    Latest Ref Rng & Units 12/15/2023    8:04 AM 11/24/2023   11:07 AM 11/04/2023    7:53 AM  CBC  WBC 4.0 - 10.5 K/uL 6.6  7.2  5.8   Hemoglobin 13.0 - 17.0 g/dL 84.5  84.0  85.2   Hematocrit 39.0 - 52.0 % 43.6  44.7  41.3   Platelets 150 - 400 K/uL 210  203  212        Latest Ref Rng & Units 12/15/2023    8:04 AM 11/24/2023   11:07 AM 11/04/2023    7:53 AM  CMP  Glucose 70 - 99 mg/dL 894  842  893   BUN 8 - 23 mg/dL 16  17  19    Creatinine 0.61 - 1.24 mg/dL 8.74  8.64  8.72   Sodium 135  - 145 mmol/L 140  140  141   Potassium 3.5 - 5.1 mmol/L 4.0  4.0  4.1   Chloride 98 - 111 mmol/L 108  108  109   CO2 22 - 32 mmol/L 27  27  27    Calcium 8.9 - 10.3 mg/dL 89.7  9.8  9.7   Total Protein 6.5 - 8.1 g/dL 6.9  6.8  6.5   Total Bilirubin 0.0 - 1.2 mg/dL 0.7  0.7  0.6   Alkaline Phos 38 - 126 U/L 72  65  69   AST 15 - 41 U/L 24  21  20    ALT 0 - 44 U/L 26  20  18      Lab Results  Component Value Date   MPROTEIN Not Observed 09/27/2020   Lab Results  Component Value Date   KPAFRELGTCHN 25.6 (H) 09/27/2020   LAMBDASER 23.0 09/27/2020   KAPLAMBRATIO 1.11 09/27/2020    RADIOGRAPHIC STUDIES: No results found.     ASSESSMENT & PLAN Martin Curry is a 89 y.o. male with medical history significant for static squamous cell carcinoma of unclear origin as well  as basal cell carcinoma behind the right ear who presents for follow-up visit.  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 Previously discussed with radiology who notes a repeat biopsy was not recommended and repeat imaging would be preferred.  --negative multiple myeloma labs with SPEP and serum free light chains showing no evidence of monoclonal gammopathy.  --Given his prior history of prostate cancer we ordered a PSA, which was normal. Repeated PSA previously, found to be within normal limits --CT C/A/P shows no evidence of disease elsewhere in the body in August 2022. Most recent PET CT scan on 10/02/2022 showed hypermetabolic osseous metastasis with a dominant mass centered about the inferior medial portion of the right mastoid, suspicious for site of primary squamous cell carcinoma. -- PET CT scan shows evidence of lesion behind the right ear as well as the known pelvic metastasis. -- Reviewed tissue with pathology to determine if both lesions are the same pathology or different origins.  At this time findings  are most consistent with a basal cell primary behind his ear and squamous cell carcinoma of the pelvis.  It is possible these at the same pathology with the basal/squamous crossover, though they may represent separate primaries.  Either way Cemiplimab  therapy should be adequate to treat both. -- PD-L1 testing showed 0% PD-L1 score, however the use of cemiplimab  does not require PD-L1 positivity (lack of PD-L1 prevents the use of pembrolizumab)  PLAN: --Proceed with cycle 20 day 1 of Cemiplimab  immunotherapy today --Labs today show white blood cell count 6.6, hemoglobin 15.4, MCV 89.7, platelets 210. creatinine and LFTs are normal.  --PET CT scan on 11/01/2023 showed overall stable disease with a mild increase in FDG avidity in hip lesion. New 6 mm FDG avid hip lesion. Next scan in Dec 2025 --Proceed with treatment today without any modifications.  -- Return to clinic for next cycle of cemiplimab  in 3 weeks time.   # Bleeding of the Ear--resolved # Drainage from Ear-- mild  -- secondary due to erosion of the tumor into his ear canal. -- wound has healed without evidence of bleeding.  -- Orange color discharge from the ear  at this time -- monitor for now.   # FDG Avid Colon Lesion --Patient underwent colonoscopy with gastroenterology and found to have large polyps. -- No evidence of colonic malignancy.  Orders Placed This Encounter  Procedures   NM PET Image Restage (PS) Whole Body    Standing Status:   Future    Expected Date:   01/27/2024    Expiration Date:   12/14/2024    If indicated for the ordered procedure, I authorize the administration of a radiopharmaceutical per Radiology protocol:   Yes    Preferred imaging location?:   [facility]    All questions were answered. The patient knows to call the clinic with any problems, questions or concerns.  A total of more than 30 minutes were spent on this encounter with face-to-face time and non-face-to-face time, including preparing to  see the patient, ordering tests and/or medications, counseling the patient and coordination of care as outlined above.   Norleen IVAR Kidney, MD Department of Hematology/Oncology [facility] at [facility] Phone: [phone] Pager: [phone] Email: [email or web address]   12/15/2023 2:28 PM

## 2023-12-15 ENCOUNTER — Other Ambulatory Visit (HOSPITAL_BASED_OUTPATIENT_CLINIC_OR_DEPARTMENT_OTHER): Payer: Self-pay

## 2023-12-15 ENCOUNTER — Inpatient Hospital Stay

## 2023-12-15 ENCOUNTER — Inpatient Hospital Stay (HOSPITAL_BASED_OUTPATIENT_CLINIC_OR_DEPARTMENT_OTHER)

## 2023-12-15 ENCOUNTER — Inpatient Hospital Stay: Admitting: Hematology and Oncology

## 2023-12-15 ENCOUNTER — Encounter: Payer: Self-pay | Admitting: Hematology and Oncology

## 2023-12-15 VITALS — BP 122/72 | HR 56 | Temp 98.2°F | Resp 14 | Wt 182.1 lb

## 2023-12-15 DIAGNOSIS — Z5112 Encounter for antineoplastic immunotherapy: Secondary | ICD-10-CM | POA: Diagnosis not present

## 2023-12-15 DIAGNOSIS — C4441 Basal cell carcinoma of skin of scalp and neck: Secondary | ICD-10-CM

## 2023-12-15 DIAGNOSIS — C4492 Squamous cell carcinoma of skin, unspecified: Secondary | ICD-10-CM

## 2023-12-15 DIAGNOSIS — C7951 Secondary malignant neoplasm of bone: Secondary | ICD-10-CM

## 2023-12-15 DIAGNOSIS — Z95828 Presence of other vascular implants and grafts: Secondary | ICD-10-CM | POA: Diagnosis not present

## 2023-12-15 LAB — CBC WITH DIFFERENTIAL (CANCER CENTER ONLY)
Abs Immature Granulocytes: 0.03 K/uL (ref 0.00–0.07)
Basophils Absolute: 0 K/uL (ref 0.0–0.1)
Basophils Relative: 0 %
Eosinophils Absolute: 0.2 K/uL (ref 0.0–0.5)
Eosinophils Relative: 3 %
HCT: 43.6 % (ref 39.0–52.0)
Hemoglobin: 15.4 g/dL (ref 13.0–17.0)
Immature Granulocytes: 1 %
Lymphocytes Relative: 18 %
Lymphs Abs: 1.2 K/uL (ref 0.7–4.0)
MCH: 31.7 pg (ref 26.0–34.0)
MCHC: 35.3 g/dL (ref 30.0–36.0)
MCV: 89.7 fL (ref 80.0–100.0)
Monocytes Absolute: 0.6 K/uL (ref 0.1–1.0)
Monocytes Relative: 9 %
Neutro Abs: 4.5 K/uL (ref 1.7–7.7)
Neutrophils Relative %: 69 %
Platelet Count: 210 K/uL (ref 150–400)
RBC: 4.86 MIL/uL (ref 4.22–5.81)
RDW: 13.2 % (ref 11.5–15.5)
WBC Count: 6.6 K/uL (ref 4.0–10.5)
nRBC: 0 % (ref 0.0–0.2)

## 2023-12-15 LAB — CMP (CANCER CENTER ONLY)
ALT: 26 U/L (ref 0–44)
AST: 24 U/L (ref 15–41)
Albumin: 4.2 g/dL (ref 3.5–5.0)
Alkaline Phosphatase: 72 U/L (ref 38–126)
Anion gap: 5 (ref 5–15)
BUN: 16 mg/dL (ref 8–23)
CO2: 27 mmol/L (ref 22–32)
Calcium: 10.2 mg/dL (ref 8.9–10.3)
Chloride: 108 mmol/L (ref 98–111)
Creatinine: 1.25 mg/dL — ABNORMAL HIGH (ref 0.61–1.24)
GFR, Estimated: 55 mL/min — ABNORMAL LOW (ref >60)
Glucose, Bld: 105 mg/dL — ABNORMAL HIGH (ref 70–99)
Potassium: 4 mmol/L (ref 3.5–5.1)
Sodium: 140 mmol/L (ref 135–145)
Total Bilirubin: 0.7 mg/dL (ref 0.0–1.2)
Total Protein: 6.9 g/dL (ref 6.5–8.1)

## 2023-12-15 LAB — TSH: TSH: 6.17 u[IU]/mL — ABNORMAL HIGH (ref 0.350–4.500)

## 2023-12-15 MED ORDER — SODIUM CHLORIDE 0.9 % IV SOLN: Freq: Once | INTRAVENOUS | Status: AC

## 2023-12-15 MED ORDER — SODIUM CHLORIDE 0.9 % IV SOLN
350.0000 mg | Freq: Once | INTRAVENOUS | Status: AC
  Administered 2023-12-15: 350 mg via INTRAVENOUS
  Filled 2023-12-15: qty 7

## 2023-12-15 MED ORDER — COMIRNATY 30 MCG/0.3ML IM SUSY
0.3000 mL | PREFILLED_SYRINGE | Freq: Once | INTRAMUSCULAR | 0 refills | Status: AC
  Filled 2023-12-15: qty 0.3, 1d supply, fill #0

## 2023-12-15 NOTE — Patient Instructions (Signed)
 [facility] - A DEPT OF [facility]  Discharge Instructions: Thank you for choosing [facility] to provide your oncology and hematology care.   If you have a lab appointment with the Cancer Center, please go directly to the Cancer Center and check in at the registration area.   Wear comfortable clothing and clothing appropriate for easy access to any Portacath or PICC line.   We strive to give you quality time with your provider. You may need to reschedule your appointment if you arrive late (15 or more minutes).  Arriving late affects you and other patients whose appointments are after yours.  Also, if you miss three or more appointments without notifying the office, you may be dismissed from the clinic at the provider's discretion.      For prescription refill requests, have your pharmacy contact our office and allow 72 hours for refills to be completed.    Today you received the following chemotherapy and/or immunotherapy agents: Cemiplimab -rwlc (Libtayo )    To help prevent nausea and vomiting after your treatment, we encourage you to take your nausea medication as directed.  BELOW ARE SYMPTOMS THAT SHOULD BE REPORTED IMMEDIATELY: *FEVER GREATER THAN 100.4 F (38 C) OR HIGHER *CHILLS OR SWEATING *NAUSEA AND VOMITING THAT IS NOT CONTROLLED WITH YOUR NAUSEA MEDICATION *UNUSUAL SHORTNESS OF BREATH *UNUSUAL BRUISING OR BLEEDING *URINARY PROBLEMS (pain or burning when urinating, or frequent urination) *BOWEL PROBLEMS (unusual diarrhea, constipation, pain near the anus) TENDERNESS IN MOUTH AND THROAT WITH OR WITHOUT PRESENCE OF ULCERS (sore throat, sores in mouth, or a toothache) UNUSUAL RASH, SWELLING OR PAIN  UNUSUAL VAGINAL DISCHARGE OR ITCHING   Items with * indicate a potential emergency and should be followed up as soon as possible or go to the Emergency Department if any problems should occur.  Please show the CHEMOTHERAPY ALERT CARD or  IMMUNOTHERAPY ALERT CARD at check-in to the Emergency Department and triage nurse.  Should you have questions after your visit or need to cancel or reschedule your appointment, please contact [facility] - A DEPT OF [facility]  Dept: [phone]  and follow the prompts.  Office hours are 8:00 a.m. to 4:30 p.m. Monday - Friday. Please note that voicemails left after 4:00 p.m. may not be returned until the following business day.  We are closed weekends and major holidays. You have access to a nurse at all times for urgent questions. Please call the main number to the clinic Dept: [phone] and follow the prompts.   For any non-urgent questions, you may also contact your provider using MyChart. We now offer e-Visits for anyone 30 and older to request care online for non-urgent symptoms. For details visit [email or web address].   Also download the MyChart app! Go to the app store, search MyChart, open the app, select [facility], and log in with your MyChart username and password.

## 2023-12-16 ENCOUNTER — Other Ambulatory Visit (HOSPITAL_BASED_OUTPATIENT_CLINIC_OR_DEPARTMENT_OTHER): Payer: Self-pay

## 2023-12-16 LAB — T4: T4, Total: 9.2 ug/dL (ref 4.5–12.0)

## 2024-01-05 ENCOUNTER — Inpatient Hospital Stay

## 2024-01-05 ENCOUNTER — Inpatient Hospital Stay (HOSPITAL_BASED_OUTPATIENT_CLINIC_OR_DEPARTMENT_OTHER): Admitting: Physician Assistant

## 2024-01-05 ENCOUNTER — Inpatient Hospital Stay: Attending: Hematology and Oncology

## 2024-01-05 VITALS — BP 119/66 | HR 64 | Temp 98.0°F | Resp 13 | Wt 182.3 lb

## 2024-01-05 DIAGNOSIS — C4492 Squamous cell carcinoma of skin, unspecified: Secondary | ICD-10-CM | POA: Diagnosis not present

## 2024-01-05 DIAGNOSIS — C4441 Basal cell carcinoma of skin of scalp and neck: Secondary | ICD-10-CM | POA: Insufficient documentation

## 2024-01-05 DIAGNOSIS — C7951 Secondary malignant neoplasm of bone: Secondary | ICD-10-CM | POA: Diagnosis not present

## 2024-01-05 DIAGNOSIS — Z5112 Encounter for antineoplastic immunotherapy: Secondary | ICD-10-CM | POA: Diagnosis not present

## 2024-01-05 DIAGNOSIS — Z79620 Long term (current) use of immunosuppressive biologic: Secondary | ICD-10-CM | POA: Insufficient documentation

## 2024-01-05 LAB — CBC WITH DIFFERENTIAL (CANCER CENTER ONLY)
Abs Immature Granulocytes: 0.02 K/uL (ref 0.00–0.07)
Basophils Absolute: 0 K/uL (ref 0.0–0.1)
Basophils Relative: 1 %
Eosinophils Absolute: 0.2 K/uL (ref 0.0–0.5)
Eosinophils Relative: 4 %
HCT: 42.3 % (ref 39.0–52.0)
Hemoglobin: 15 g/dL (ref 13.0–17.0)
Immature Granulocytes: 0 %
Lymphocytes Relative: 17 %
Lymphs Abs: 1 K/uL (ref 0.7–4.0)
MCH: 32 pg (ref 26.0–34.0)
MCHC: 35.5 g/dL (ref 30.0–36.0)
MCV: 90.2 fL (ref 80.0–100.0)
Monocytes Absolute: 0.4 K/uL (ref 0.1–1.0)
Monocytes Relative: 7 %
Neutro Abs: 4.3 K/uL (ref 1.7–7.7)
Neutrophils Relative %: 71 %
Platelet Count: 185 K/uL (ref 150–400)
RBC: 4.69 MIL/uL (ref 4.22–5.81)
RDW: 13.6 % (ref 11.5–15.5)
WBC Count: 6.1 K/uL (ref 4.0–10.5)
nRBC: 0 % (ref 0.0–0.2)

## 2024-01-05 LAB — TSH: TSH: 4.33 u[IU]/mL (ref 0.350–4.500)

## 2024-01-05 LAB — CMP (CANCER CENTER ONLY)
ALT: 31 U/L (ref 0–44)
AST: 28 U/L (ref 15–41)
Albumin: 4 g/dL (ref 3.5–5.0)
Alkaline Phosphatase: 69 U/L (ref 38–126)
Anion gap: 6 (ref 5–15)
BUN: 21 mg/dL (ref 8–23)
CO2: 25 mmol/L (ref 22–32)
Calcium: 9.4 mg/dL (ref 8.9–10.3)
Chloride: 108 mmol/L (ref 98–111)
Creatinine: 1.25 mg/dL — ABNORMAL HIGH (ref 0.61–1.24)
GFR, Estimated: 55 mL/min — ABNORMAL LOW (ref >60)
Glucose, Bld: 159 mg/dL — ABNORMAL HIGH (ref 70–99)
Potassium: 3.8 mmol/L (ref 3.5–5.1)
Sodium: 139 mmol/L (ref 135–145)
Total Bilirubin: 0.7 mg/dL (ref 0.0–1.2)
Total Protein: 6.6 g/dL (ref 6.5–8.1)

## 2024-01-05 MED ORDER — SODIUM CHLORIDE 0.9 % IV SOLN
350.0000 mg | Freq: Once | INTRAVENOUS | Status: AC
  Administered 2024-01-05: 350 mg via INTRAVENOUS
  Filled 2024-01-05: qty 7

## 2024-01-05 MED ORDER — SODIUM CHLORIDE 0.9 % IV SOLN: Freq: Once | INTRAVENOUS | Status: AC

## 2024-01-05 NOTE — Patient Instructions (Signed)
 [facility] - A DEPT OF [facility]  Discharge Instructions: Thank you for choosing [facility] to provide your oncology and hematology care.   If you have a lab appointment with the Cancer Center, please go directly to the Cancer Center and check in at the registration area.   Wear comfortable clothing and clothing appropriate for easy access to any Portacath or PICC line.   We strive to give you quality time with your provider. You may need to reschedule your appointment if you arrive late (15 or more minutes).  Arriving late affects you and other patients whose appointments are after yours.  Also, if you miss three or more appointments without notifying the office, you may be dismissed from the clinic at the provider's discretion.      For prescription refill requests, have your pharmacy contact our office and allow 72 hours for refills to be completed.    Today you received the following chemotherapy and/or immunotherapy agents: Cemiplimab -rwlc (Libtayo )    To help prevent nausea and vomiting after your treatment, we encourage you to take your nausea medication as directed.  BELOW ARE SYMPTOMS THAT SHOULD BE REPORTED IMMEDIATELY: *FEVER GREATER THAN 100.4 F (38 C) OR HIGHER *CHILLS OR SWEATING *NAUSEA AND VOMITING THAT IS NOT CONTROLLED WITH YOUR NAUSEA MEDICATION *UNUSUAL SHORTNESS OF BREATH *UNUSUAL BRUISING OR BLEEDING *URINARY PROBLEMS (pain or burning when urinating, or frequent urination) *BOWEL PROBLEMS (unusual diarrhea, constipation, pain near the anus) TENDERNESS IN MOUTH AND THROAT WITH OR WITHOUT PRESENCE OF ULCERS (sore throat, sores in mouth, or a toothache) UNUSUAL RASH, SWELLING OR PAIN  UNUSUAL VAGINAL DISCHARGE OR ITCHING   Items with * indicate a potential emergency and should be followed up as soon as possible or go to the Emergency Department if any problems should occur.  Please show the CHEMOTHERAPY ALERT CARD or  IMMUNOTHERAPY ALERT CARD at check-in to the Emergency Department and triage nurse.  Should you have questions after your visit or need to cancel or reschedule your appointment, please contact [facility] - A DEPT OF [facility]  Dept: [phone]  and follow the prompts.  Office hours are 8:00 a.m. to 4:30 p.m. Monday - Friday. Please note that voicemails left after 4:00 p.m. may not be returned until the following business day.  We are closed weekends and major holidays. You have access to a nurse at all times for urgent questions. Please call the main number to the clinic Dept: [phone] and follow the prompts.   For any non-urgent questions, you may also contact your provider using MyChart. We now offer e-Visits for anyone 30 and older to request care online for non-urgent symptoms. For details visit [email or web address].   Also download the MyChart app! Go to the app store, search MyChart, open the app, select [facility], and log in with your MyChart username and password.

## 2024-01-05 NOTE — Progress Notes (Signed)
 [facility] [phone]   [phone]  PROGRESS NOTE  Patient Care Team: Yolande Toribio MATSU, MD as PCP - General (Internal Medicine) Watt Rush, MD as Attending Physician (Urology) Izell Domino, MD as Consulting Physician (Radiation Oncology) Malmfelt, Delon CROME, RN as Oncology Nurse Navigator (Oncology) Lynnell Nottingham, MD as Consulting Physician (Dermatology) Federico Rosario BROCKS, MD as Consulting Physician (Gastroenterology) Federico Rush ONEIDA MADISON, MD as Consulting Physician (Hematology and Oncology)  Hematological/Oncological History # Bone Lesions on CT/MRI # 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 Additionally there was noted to be a new enhancing lesion at the tip of the right greater trochanter measuring up to 1.7 cm also suspicious for metastatic disease. 09/27/2020: establish care with Dr. Federico 10/29/2020: CT biopsy performed of left iliac bone lesion. Pathology showed bone and marrow without evidence of carcinoma.  11/11/2022: Cycle 1 Day 1 of cemiplimab  therapy.  12/03/2022: Cycle 2 Day 1 of cemiplimab  therapy.  12/23/2022: Cycle 3 Day 1 of cemiplimab  therapy.  01/13/2023: Cycle 4 Day 1 of cemiplimab  therapy.  02/03/2023: Cycle 5 Day 1 of cemiplimab  therapy.  02/23/2023: Cycle 6 Day 1 of cemiplimab  therapy.  03/16/2023:  Cycle 7 Day 1 of cemiplimab  therapy.  04/06/2023: Cycle 8 Day 1 of cemiplimab  therapy.  04/28/2023: Cycle 9 Day 1 of cemiplimab  therapy.  05/18/2023: Cycle 10 Day 1 of cemiplimab  therapy.  06/08/2023: Cycle 11 Day 1 of cemiplimab  therapy.  06/30/2023: Cycle 12 Day 1 of cemiplimab  therapy.  07/21/2023: Cycle 13 Day 1 of  cemiplimab  therapy.  08/11/2023: Cycle 14 Day 1 of cemplimab therapy.  09/02/2023: Cycle 15 Day 1 of cemiplimab  therapy.  09/23/2023: Cycle 16 Day 1 of cemiplimab  therapy.  10/13/2023: Cycle 17 Day 1 of cemiplimab  therapy.  11/04/2023: Cycle 18 Day 1 of cemiplimab  therapy.  12/15/2023: Cycle 19 Day 1 of cemiplimab  therapy.  01/05/2024: Cycle 20 Day 1 of cemiplimab  therapy.   Interval History:  Martin Curry 89 y.o. 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 The patient's last visit was on 12/15/2023. In the interim since the last visit he has completed Cycle 19 of cemiplimab .  On exam today Mr. Riedl reports he is doing well and tolerating treatment without any new or concerning symptoms.  He reports that he is barely noticing any drainage from his ear.  He inquired about putting his hearing aids back on his right ear.  His energy and appetite are unchanged.  He denies nausea, vomiting or bowel habit changes.  He denies easy bruising or signs of active bleeding.  He denies fevers, chills, night sweats, shortness of breath, chest pain or cough.  He has no other complaints.  Rest of the 10 point ROS as below.   MEDICAL HISTORY:  Past Medical History:  Diagnosis Date   Atypical nevus 09/07/2002   left abdomen-slight   BCC (basal cell carcinoma of skin) 07/19/2014   left sideburn- +margin-exc.   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 if orignated from skin cancer   Nodular basal cell carcinoma (BCC) 11/21/2012   right side of scalp-CX35FU   Nodular basal cell carcinoma (BCC) 06/12/2014   left sideburn-CX35FU/exc.   Nodular basal cell carcinoma (BCC) 07/16/2015   left scalp-CX35FU   Polycythemia    Prostate cancer (HCC) 1990's   total prostatectomy and adjuvant radiation. Last PSA was 0.02 in 09/2011   SCC (squamous cell carcinoma) 04/03/2009   forehead-txpbx   SCC (squamous cell carcinoma) 07/16/2015   left front scalp   SCC (squamous cell carcinoma) 07/16/2015   left front scalp-Cx35FU   SCCA (squamous cell carcinoma) of skin 12/20/2019   Right forehead (in situ)   SCCA (squamous cell carcinoma) of skin 05/27/2020   Mid Parietal Scalp (in situ)   Superficial basal cell carcinoma (BCC) 11/21/2012   behind left ear-CX35FU, midback-CX35FU, right forehead-CX35FU-exc   Superficial basal cell carcinoma (BCC) 11/02/2017   left post neck-CX35FU   Superficial nodular basal cell carcinoma (BCC) 12/20/2019   Left temporal scalp   Superficial nodular basal cell carcinoma (BCC) 05/27/2020   Right Preauricular area (curet and 5FU)    SURGICAL HISTORY: Past Surgical History:  Procedure Laterality Date   ADJACENT TISSUE TRANSFER/TISSUE REARRANGEMENT N/A 05/22/2019   Procedure: COMPLEX CLOSURE OF NECK WOUND;  Surgeon: Elisabeth Craig RAMAN, MD;  Location: [facility] OR;  Service: Plastics;  Laterality: N/A;   ALLOGRAFT APPLICATION N/A 05/22/2019   Procedure: POSSIBLE FACIAL NERVE RECONSTRUCTION WITH NERVE ALLOGRAFT VS AUTOGRAFT;  Surgeon: Elisabeth Craig RAMAN, MD;  Location: [facility] OR;  Service: Plastics;  Laterality: N/A;   BLADDER SURGERY     due to prostatectomy.    CATARACT EXTRACTION     right   COLONOSCOPY     neg in the past; due in 2014 with [place] Dr. Jakie   EYE SURGERY     1950   HERNIA REPAIR     IR IMAGING GUIDED PORT INSERTION  11/30/2022   MOHS SURGERY     NECK  DISSECTION  05/22/2019    NECK DISSECTION   PAROTIDECTOMY  05/22/2019   PAROTIDECTOMY with facial nerve dissection    PAROTIDECTOMY Right 05/22/2019   Procedure: PAROTIDECTOMY;  Surgeon: Jesus Oliphant, MD;  Location: [facility] OR;  Service: ENT;  Laterality: Right;   PROSTATE SURGERY     RADICAL NECK DISSECTION Right 05/22/2019   Procedure: RADICAL NECK DISSECTION;  Surgeon: Jesus Oliphant, MD;  Location: [facility] OR;  Service: ENT;  Laterality: Right;   TONSILLECTOMY      SOCIAL HISTORY: Social History   Socioeconomic History   Marital status: Married    Spouse name: Not on file   Number of children: 3   Years of education: Not on file   Highest education level: Not on file  Occupational History    Comment: retired scientist, 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   Occupation: retired  Tobacco Use   Smoking status: Never   Smokeless tobacco: Never  Vaping Use   Vaping status: Never Used  Substance and Sexual Activity   Alcohol  use: No  Drug use: No   Sexual activity: Not on file  Other Topics Concern   Not on file  Social History Narrative   Not on file   Social Drivers of Health   Financial Resource Strain: Not on file  Food Insecurity: No Food Insecurity (10/29/2022)   Hunger Vital Sign    Worried About Running Out of Food in the Last Year: Never true    Ran Out of Food in the Last Year: Never true  Transportation Needs: No Transportation Needs (10/29/2022)   PRAPARE - Administrator, Civil Service (Medical): No    Lack of Transportation (Non-Medical): No  Physical Activity: Not on file  Stress: Not on file  Social Connections: Not on file  Intimate Partner Violence: Not At Risk (10/29/2022)   Humiliation, Afraid, Rape, and Kick questionnaire    Fear of Current or Ex-Partner: No    Emotionally Abused: No    Physically Abused: No    Sexually Abused: No    FAMILY HISTORY: Family History  Problem Relation Age of Onset   Uterine cancer Mother 22   Ovarian cancer Mother    Lung  cancer Father 75   Prostate cancer Brother    Prostate cancer Brother    Colon cancer Neg Hx    Stomach cancer Neg Hx    Esophageal cancer Neg Hx    Rectal cancer Neg Hx     ALLERGIES:  is allergic to methylprednisolone, penicillins, latex, and tape.  MEDICATIONS:  Current Outpatient Medications  Medication Sig Dispense Refill   amLODipine  (NORVASC ) 5 MG tablet Take 5 mg by mouth daily.      aspirin  81 MG tablet Take 81 mg by mouth daily.     benazepril  (LOTENSIN ) 10 MG tablet Take 10 mg by mouth daily.     famotidine -calcium carbonate-magnesium hydroxide (PEPCID  COMPLETE) 10-800-165 MG chewable tablet Take one tablet PRN     levothyroxine  (SYNTHROID ) 25 MCG tablet Take 25 mcg by mouth daily before breakfast. 2 tABS M,W,F and one the other days     lidocaine -prilocaine  (EMLA ) cream Apply 1 Application topically as needed. 30 g 0   Multiple Vitamin (MULTIVITAMIN) tablet Take 1 tablet by mouth daily.     ondansetron  (ZOFRAN ) 8 MG tablet Take 1 tablet (8 mg total) by mouth every 8 (eight) hours as needed for nausea or vomiting. 20 tablet 1   potassium citrate  (UROCIT-K ) 10 MEQ (1080 MG) SR tablet Take 10 mEq by mouth 3 (three) times daily with meals. (Patient not taking: Reported on 11/24/2023)     simvastatin  (ZOCOR ) 20 MG tablet Take 20 mg by mouth every evening.     No current facility-administered medications for this visit.    REVIEW OF SYSTEMS:   Constitutional: ( - ) fevers, ( - )  chills , ( - ) night sweats Eyes: ( - ) blurriness of vision, ( - ) double vision, ( - ) watery eyes Ears, nose, mouth, throat, and face: ( - ) mucositis, ( - ) sore throat Respiratory: ( - ) cough, ( - ) dyspnea, ( - ) wheezes Cardiovascular: ( - ) palpitation, ( - ) chest discomfort, ( - ) lower extremity swelling Gastrointestinal:  ( - ) nausea, ( - ) heartburn, ( - ) change in bowel habits Skin: ( - ) abnormal skin rashes Lymphatics: ( - ) new lymphadenopathy, ( - ) easy bruising Neurological: (  - ) numbness, ( - ) tingling, ( - ) new weaknesses Behavioral/Psych: ( - )  mood change, ( - ) new changes  All other systems were reviewed with the patient and are negative.  PHYSICAL EXAMINATION: ECOG PERFORMANCE STATUS: 1 - Symptomatic but completely ambulatory  There were no vitals filed for this visit.  There were no vitals filed for this visit.   GENERAL: Well-appearing elderly Caucasian male, alert, no distress and comfortable SKIN: skin color, texture, turgor are normal, no rashes or significant lesions EYES: conjunctiva are pink and non-injected, sclera clear EAR: Orange/red colored drainage from inner ear.  LUNGS: clear to auscultation and percussion with normal breathing effort HEART: regular rate & rhythm and no murmurs and no lower extremity edema Musculoskeletal: no cyanosis of digits and no clubbing  PSYCH: alert & oriented x 3, fluent speech NEURO: no focal motor/sensory deficits  LABORATORY DATA:  I have reviewed the data as listed    Latest Ref Rng & Units 12/15/2023    8:04 AM 11/24/2023   11:07 AM 11/04/2023    7:53 AM  CBC  WBC 4.0 - 10.5 K/uL 6.6  7.2  5.8   Hemoglobin 13.0 - 17.0 g/dL 84.5  84.0  85.2   Hematocrit 39.0 - 52.0 % 43.6  44.7  41.3   Platelets 150 - 400 K/uL 210  203  212        Latest Ref Rng & Units 12/15/2023    8:04 AM 11/24/2023   11:07 AM 11/04/2023    7:53 AM  CMP  Glucose 70 - 99 mg/dL 894  842  893   BUN 8 - 23 mg/dL 16  17  19    Creatinine 0.61 - 1.24 mg/dL 8.74  8.64  8.72   Sodium 135 - 145 mmol/L 140  140  141   Potassium 3.5 - 5.1 mmol/L 4.0  4.0  4.1   Chloride 98 - 111 mmol/L 108  108  109   CO2 22 - 32 mmol/L 27  27  27    Calcium 8.9 - 10.3 mg/dL 89.7  9.8  9.7   Total Protein 6.5 - 8.1 g/dL 6.9  6.8  6.5   Total Bilirubin 0.0 - 1.2 mg/dL 0.7  0.7  0.6   Alkaline Phos 38 - 126 U/L 72  65  69   AST 15 - 41 U/L 24  21  20    ALT 0 - 44 U/L 26  20  18      Lab Results  Component Value Date   MPROTEIN Not Observed  09/27/2020   Lab Results  Component Value Date   KPAFRELGTCHN 25.6 (H) 09/27/2020   LAMBDASER 23.0 09/27/2020   KAPLAMBRATIO 1.11 09/27/2020    RADIOGRAPHIC STUDIES: No results found.     ASSESSMENT & PLAN Martin Curry is a 89 y.o. 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 Previously discussed with radiology who notes a repeat biopsy was not recommended and repeat imaging would be preferred.  --negative multiple myeloma labs with SPEP and serum free light chains showing no evidence of monoclonal gammopathy.  --Given his prior history of prostate cancer we ordered a PSA, which was normal. Repeated PSA previously, found to be within normal limits --CT C/A/P shows no evidence of disease elsewhere in the body in August 2022. Most recent  PET CT scan on 10/02/2022 showed hypermetabolic osseous metastasis with a dominant mass centered about the inferior medial portion of the right mastoid, suspicious for site of primary squamous cell carcinoma. -- PET CT scan shows evidence of lesion behind the right ear as well as the known pelvic metastasis. -- Reviewed tissue with pathology to determine if both lesions are the same pathology or different origins.  At this time findings are most consistent with a basal cell primary behind his ear and squamous cell carcinoma of the pelvis.  It is possible these at the same pathology with the basal/squamous crossover, though they may represent separate primaries.  Either way Cemiplimab  therapy should be adequate to treat both. -- PD-L1 testing showed 0% PD-L1 score, however the use of cemiplimab  does not require PD-L1 positivity (lack of PD-L1 prevents the use of pembrolizumab)   PLAN: --Proceed with cycle 21 day 1 of Cemiplimab  immunotherapy today --Labs today show WBC 6.1, hemoglobin 15.0, platelets 185, Creatinine stable at 1.25, LFTs normal. --PET CT scan on 11/01/2023 showed overall stable disease with a mild increase in FDG avidity in hip lesion. New 6 mm FDG avid hip lesion. Next scan in Dec 2025 --Proceed with treatment today without any modifications.  -- Return to clinic for next cycle of cemiplimab  in 3 weeks time.   # Bleeding of the Ear--resolved # Drainage from Ear-- mild  -- secondary due to erosion of the tumor into his ear canal. -- wound has healed without evidence of bleeding.  -- Minimal drainage at this time. -- Advised to wait for repeat PET scan to determine if he is eligible to put his hearing aid back on.   #Thyroid  disease: -- Currently on Synthroid  50 mcg (2 tablets) M/W/F and then 25 mcg on other days.  -- Thyroid  levels are pending.  Will follow-up if any further dose adjustment is needed.  # FDG Avid Colon Lesion --Patient underwent colonoscopy with gastroenterology and found to have large polyps. -- No evidence of colonic malignancy.  No orders of the defined types were placed in this encounter.   All questions were answered. The patient knows to call the clinic with any problems, questions or concerns.  A total of more than 30 minutes were spent on this encounter with face-to-face time and non-face-to-face time, including preparing to see the patient, ordering tests and/or medications, counseling the patient and coordination of care as outlined above.   Johnston Police PA-C Dept of Hematology and Oncology [facility] Phone: [phone]    01/05/2024 5:20 AM

## 2024-01-06 ENCOUNTER — Other Ambulatory Visit: Payer: Self-pay

## 2024-01-07 LAB — T4: T4, Total: 8.4 ug/dL (ref 4.5–12.0)

## 2024-01-14 ENCOUNTER — Other Ambulatory Visit: Payer: Self-pay

## 2024-01-26 ENCOUNTER — Inpatient Hospital Stay

## 2024-01-26 ENCOUNTER — Inpatient Hospital Stay: Attending: Hematology and Oncology

## 2024-01-26 ENCOUNTER — Inpatient Hospital Stay: Admitting: Hematology and Oncology

## 2024-01-26 VITALS — BP 124/68 | HR 62 | Temp 97.7°F | Resp 13 | Wt 182.8 lb

## 2024-01-26 DIAGNOSIS — Z79620 Long term (current) use of immunosuppressive biologic: Secondary | ICD-10-CM | POA: Diagnosis not present

## 2024-01-26 DIAGNOSIS — C4441 Basal cell carcinoma of skin of scalp and neck: Secondary | ICD-10-CM | POA: Insufficient documentation

## 2024-01-26 DIAGNOSIS — C4492 Squamous cell carcinoma of skin, unspecified: Secondary | ICD-10-CM | POA: Diagnosis not present

## 2024-01-26 DIAGNOSIS — Z5112 Encounter for antineoplastic immunotherapy: Secondary | ICD-10-CM

## 2024-01-26 DIAGNOSIS — C7951 Secondary malignant neoplasm of bone: Secondary | ICD-10-CM | POA: Diagnosis not present

## 2024-01-26 DIAGNOSIS — E039 Hypothyroidism, unspecified: Secondary | ICD-10-CM | POA: Diagnosis not present

## 2024-01-26 DIAGNOSIS — Z95828 Presence of other vascular implants and grafts: Secondary | ICD-10-CM

## 2024-01-26 LAB — CMP (CANCER CENTER ONLY)
ALT: 40 U/L (ref 0–44)
AST: 34 U/L (ref 15–41)
Albumin: 4.2 g/dL (ref 3.5–5.0)
Alkaline Phosphatase: 79 U/L (ref 38–126)
Anion gap: 8 (ref 5–15)
BUN: 19 mg/dL (ref 8–23)
CO2: 26 mmol/L (ref 22–32)
Calcium: 9.7 mg/dL (ref 8.9–10.3)
Chloride: 106 mmol/L (ref 98–111)
Creatinine: 1.35 mg/dL — ABNORMAL HIGH (ref 0.61–1.24)
GFR, Estimated: 50 mL/min — ABNORMAL LOW (ref >60)
Glucose, Bld: 123 mg/dL — ABNORMAL HIGH (ref 70–99)
Potassium: 4 mmol/L (ref 3.5–5.1)
Sodium: 140 mmol/L (ref 135–145)
Total Bilirubin: 0.6 mg/dL (ref 0.0–1.2)
Total Protein: 6.9 g/dL (ref 6.5–8.1)

## 2024-01-26 LAB — CBC WITH DIFFERENTIAL (CANCER CENTER ONLY)
Abs Immature Granulocytes: 0.02 K/uL (ref 0.00–0.07)
Basophils Absolute: 0.1 K/uL (ref 0.0–0.1)
Basophils Relative: 1 %
Eosinophils Absolute: 0.2 K/uL (ref 0.0–0.5)
Eosinophils Relative: 3 %
HCT: 44 % (ref 39.0–52.0)
Hemoglobin: 15.5 g/dL (ref 13.0–17.0)
Immature Granulocytes: 0 %
Lymphocytes Relative: 18 %
Lymphs Abs: 1.2 K/uL (ref 0.7–4.0)
MCH: 31.7 pg (ref 26.0–34.0)
MCHC: 35.2 g/dL (ref 30.0–36.0)
MCV: 90 fL (ref 80.0–100.0)
Monocytes Absolute: 0.7 K/uL (ref 0.1–1.0)
Monocytes Relative: 10 %
Neutro Abs: 4.7 K/uL (ref 1.7–7.7)
Neutrophils Relative %: 68 %
Platelet Count: 209 K/uL (ref 150–400)
RBC: 4.89 MIL/uL (ref 4.22–5.81)
RDW: 13.2 % (ref 11.5–15.5)
WBC Count: 6.8 K/uL (ref 4.0–10.5)
nRBC: 0 % (ref 0.0–0.2)

## 2024-01-26 LAB — TSH: TSH: 5.05 u[IU]/mL — ABNORMAL HIGH (ref 0.350–4.500)

## 2024-01-26 MED ORDER — SODIUM CHLORIDE 0.9 % IV SOLN: Freq: Once | INTRAVENOUS | Status: AC

## 2024-01-26 MED ORDER — SODIUM CHLORIDE 0.9% FLUSH: 10.0000 mL | INTRAVENOUS | Status: DC | PRN

## 2024-01-26 MED ORDER — SODIUM CHLORIDE 0.9 % IV SOLN
350.0000 mg | Freq: Once | INTRAVENOUS | Status: AC
  Administered 2024-01-26: 350 mg via INTRAVENOUS
  Filled 2024-01-26: qty 7

## 2024-01-26 NOTE — Patient Instructions (Signed)
 [facility] - A DEPT OF [facility]  Discharge Instructions: Thank you for choosing [facility] to provide your oncology and hematology care.   If you have a lab appointment with the Cancer Center, please go directly to the Cancer Center and check in at the registration area.   Wear comfortable clothing and clothing appropriate for easy access to any Portacath or PICC line.   We strive to give you quality time with your provider. You may need to reschedule your appointment if you arrive late (15 or more minutes).  Arriving late affects you and other patients whose appointments are after yours.  Also, if you miss three or more appointments without notifying the office, you may be dismissed from the clinic at the provider's discretion.      For prescription refill requests, have your pharmacy contact our office and allow 72 hours for refills to be completed.    Today you received the following chemotherapy and/or immunotherapy agents libtayo      To help prevent nausea and vomiting after your treatment, we encourage you to take your nausea medication as directed.  BELOW ARE SYMPTOMS THAT SHOULD BE REPORTED IMMEDIATELY: *FEVER GREATER THAN 100.4 F (38 C) OR HIGHER *CHILLS OR SWEATING *NAUSEA AND VOMITING THAT IS NOT CONTROLLED WITH YOUR NAUSEA MEDICATION *UNUSUAL SHORTNESS OF BREATH *UNUSUAL BRUISING OR BLEEDING *URINARY PROBLEMS (pain or burning when urinating, or frequent urination) *BOWEL PROBLEMS (unusual diarrhea, constipation, pain near the anus) TENDERNESS IN MOUTH AND THROAT WITH OR WITHOUT PRESENCE OF ULCERS (sore throat, sores in mouth, or a toothache) UNUSUAL RASH, SWELLING OR PAIN  UNUSUAL VAGINAL DISCHARGE OR ITCHING   Items with * indicate a potential emergency and should be followed up as soon as possible or go to the Emergency Department if any problems should occur.  Please show the CHEMOTHERAPY ALERT CARD or IMMUNOTHERAPY  ALERT CARD at check-in to the Emergency Department and triage nurse.  Should you have questions after your visit or need to cancel or reschedule your appointment, please contact [facility] - A DEPT OF [facility]  Dept: [phone]  and follow the prompts.  Office hours are 8:00 a.m. to 4:30 p.m. Monday - Friday. Please note that voicemails left after 4:00 p.m. may not be returned until the following business day.  We are closed weekends and major holidays. You have access to a nurse at all times for urgent questions. Please call the main number to the clinic Dept: [phone] and follow the prompts.   For any non-urgent questions, you may also contact your provider using MyChart. We now offer e-Visits for anyone 104 and older to request care online for non-urgent symptoms. For details visit [email or web address].   Also download the MyChart app! Go to the app store, search "MyChart", open the app, select [facility], and log in with your MyChart username and password.

## 2024-01-26 NOTE — Progress Notes (Signed)
 [facility] [phone]   [phone]  PROGRESS NOTE  Patient Care Team: Yolande Toribio MATSU, MD as PCP - General (Internal Medicine) Watt Rush, MD as Attending Physician (Urology) Izell Domino, MD as Consulting Physician (Radiation Oncology) Malmfelt, Delon CROME, RN as Oncology Nurse Navigator (Oncology) Lynnell Nottingham, MD as Consulting Physician (Dermatology) Federico Rosario BROCKS, MD as Consulting Physician (Gastroenterology) Federico Rush ONEIDA MADISON, MD as Consulting Physician (Hematology and Oncology)  Hematological/Oncological History # Bone Lesions on CT/MRI # 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 Additionally there was noted to be a new enhancing lesion at the tip of the right greater trochanter measuring up to 1.7 cm also suspicious for metastatic disease. 09/27/2020: establish care with Dr. Federico 10/29/2020: CT biopsy performed of left iliac bone lesion. Pathology showed bone and marrow without evidence of carcinoma.  11/11/2022: Cycle 1 Day 1 of cemiplimab  therapy.  12/03/2022: Cycle 2 Day 1 of cemiplimab  therapy.  12/23/2022: Cycle 3 Day 1 of cemiplimab  therapy.  01/13/2023: Cycle 4 Day 1 of cemiplimab  therapy.  02/03/2023: Cycle 5 Day 1 of cemiplimab  therapy.  02/23/2023: Cycle 6 Day 1 of cemiplimab  therapy.  03/16/2023:  Cycle 7 Day 1 of cemiplimab  therapy.  04/06/2023: Cycle 8 Day 1 of cemiplimab  therapy.  04/28/2023: Cycle 9 Day 1 of cemiplimab  therapy.  05/18/2023: Cycle 10 Day 1 of cemiplimab  therapy.  06/08/2023: Cycle 11 Day 1 of cemiplimab  therapy.  06/30/2023: Cycle 12 Day 1 of cemiplimab  therapy.  07/21/2023: Cycle 13 Day 1 of  cemiplimab  therapy.  08/11/2023: Cycle 14 Day 1 of cemplimab therapy.  09/02/2023: Cycle 15 Day 1 of cemiplimab  therapy.  09/23/2023: Cycle 16 Day 1 of cemiplimab  therapy.  10/13/2023: Cycle 17 Day 1 of cemiplimab  therapy.  11/04/2023: Cycle 18 Day 1 of cemiplimab  therapy.  12/15/2023: Cycle 19 Day 1 of cemiplimab  therapy.  01/05/2024: Cycle 20 Day 1 of cemiplimab  therapy.   Interval History:  CIAN COSTANZO 89 y.o. 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 The patient's last visit was on 01/05/2024. In the interim since the last visit he has completed Cycle 21 of cemiplimab .  On exam today Mr. Sturdivant reports he is been well overall in interim since our last visit.  He reports he has a strong appetite and ate plenty of leftovers.  He ate leftovers for 3 days after Thanksgiving.  He reports has been 3 weeks since he had any drainage from his ear.  He reports is looking forward to his upcoming PET CT scan.  He notes that he is a little bit nervous about the upcoming snowstorm.  He reports has had no issues with runny nose, sore throat, cough.  He denies any fevers, chills, sweats.  He does have a little bit of tingling at the top of his head which is thought to be secondary to his prior skin surgeries.  He reports that he has low energy which tends to be better in the morning but then peters out towards the end of the day.  He notes he is able to do all of his day-to-day tasks  without difficulty.  Overall he feels well and is willing and able to continue with treatment at this time.   MEDICAL HISTORY:  Past Medical History:  Diagnosis Date   Atypical nevus 09/07/2002   left abdomen-slight   BCC (basal cell carcinoma of skin) 07/19/2014   left sideburn- +margin-exc.   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 if orignated from skin cancer   Nodular basal cell carcinoma (BCC) 11/21/2012   right side of scalp-CX35FU   Nodular basal cell carcinoma (BCC) 06/12/2014   left sideburn-CX35FU/exc.   Nodular basal cell carcinoma (BCC) 07/16/2015   left scalp-CX35FU   Polycythemia    Prostate cancer (HCC) 1990's   total prostatectomy and adjuvant radiation. Last PSA was 0.02 in 09/2011   SCC (squamous cell carcinoma) 04/03/2009   forehead-txpbx   SCC (squamous cell carcinoma) 07/16/2015   left front scalp   SCC (squamous cell carcinoma) 07/16/2015   left front scalp-Cx35FU   SCCA (squamous cell carcinoma) of skin 12/20/2019   Right forehead (in situ)   SCCA (squamous cell carcinoma) of skin 05/27/2020   Mid Parietal Scalp (in situ)   Superficial basal cell carcinoma (BCC) 11/21/2012   behind left ear-CX35FU, midback-CX35FU, right forehead-CX35FU-exc   Superficial basal cell carcinoma (BCC) 11/02/2017   left post neck-CX35FU   Superficial nodular basal cell carcinoma (BCC) 12/20/2019   Left temporal scalp   Superficial nodular basal cell carcinoma (BCC) 05/27/2020   Right Preauricular area (curet and 5FU)    SURGICAL HISTORY: Past Surgical History:  Procedure Laterality Date   ADJACENT TISSUE TRANSFER/TISSUE REARRANGEMENT N/A 05/22/2019   Procedure: COMPLEX CLOSURE OF NECK WOUND;  Surgeon: Elisabeth Craig RAMAN, MD;  Location: [facility] OR;  Service: Plastics;  Laterality: N/A;   ALLOGRAFT APPLICATION N/A 05/22/2019   Procedure: POSSIBLE FACIAL NERVE RECONSTRUCTION WITH NERVE ALLOGRAFT VS AUTOGRAFT;   Surgeon: Elisabeth Craig RAMAN, MD;  Location: [facility] OR;  Service: Plastics;  Laterality: N/A;   BLADDER SURGERY     due to prostatectomy.    CATARACT EXTRACTION     right   COLONOSCOPY     neg in the past; due in 2014 with [place] Dr. Jakie   EYE SURGERY     1950   HERNIA REPAIR     IR IMAGING GUIDED PORT INSERTION  11/30/2022   MOHS SURGERY     NECK DISSECTION  05/22/2019    NECK DISSECTION   PAROTIDECTOMY  05/22/2019   PAROTIDECTOMY with facial nerve dissection    PAROTIDECTOMY Right 05/22/2019   Procedure: PAROTIDECTOMY;  Surgeon: Jesus Oliphant, MD;  Location[facility] OR;  Service: ENT;  Laterality: Right;   PROSTATE SURGERY     RADICAL NECK DISSECTION Right 05/22/2019   Procedure: RADICAL NECK DISSECTION;  Surgeon: Jesus Oliphant, MD;  Location: [facility] OR;  Service: ENT;  Laterality: Right;   TONSILLECTOMY      SOCIAL HISTORY: Social History   Socioeconomic History  Marital status: Married    Spouse name: Not on file   Number of children: 3   Years of education: Not on file   Highest education level: Not on file  Occupational History    Comment: retired scientist, 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   Occupation: retired  Tobacco Use   Smoking status: Never   Smokeless tobacco: Never  Vaping Use   Vaping status: Never Used  Substance and Sexual Activity   Alcohol  use: No   Drug use: No   Sexual activity: Not on file  Other Topics Concern   Not on file  Social History Narrative   Not on file   Social Drivers of Health   Financial Resource Strain: Not on file  Food Insecurity: No Food Insecurity (10/29/2022)   Hunger Vital Sign    Worried About Running Out of Food in the Last Year: Never true    Ran Out of Food in the Last Year: Never true  Transportation Needs: No Transportation Needs (10/29/2022)   PRAPARE - Administrator, Civil Service (Medical): No    Lack of Transportation (Non-Medical): No  Physical Activity: Not on file  Stress: Not on file  Social Connections:  Not on file  Intimate Partner Violence: Not At Risk (10/29/2022)   Humiliation, Afraid, Rape, and Kick questionnaire    Fear of Current or Ex-Partner: No    Emotionally Abused: No    Physically Abused: No    Sexually Abused: No    FAMILY HISTORY: Family History  Problem Relation Age of Onset   Uterine cancer Mother 4   Ovarian cancer Mother    Lung cancer Father 15   Prostate cancer Brother    Prostate cancer Brother    Colon cancer Neg Hx    Stomach cancer Neg Hx    Esophageal cancer Neg Hx    Rectal cancer Neg Hx     ALLERGIES:  is allergic to methylprednisolone, penicillins, latex, and tape.  MEDICATIONS:  Current Outpatient Medications  Medication Sig Dispense Refill   amLODipine  (NORVASC ) 5 MG tablet Take 5 mg by mouth daily.      aspirin  81 MG tablet Take 81 mg by mouth daily.     benazepril  (LOTENSIN ) 10 MG tablet Take 10 mg by mouth daily.     famotidine -calcium carbonate-magnesium hydroxide (PEPCID  COMPLETE) [phone] MG chewable tablet Take one tablet PRN     levothyroxine  (SYNTHROID ) 25 MCG tablet Take 25 mcg by mouth daily before breakfast. 2 tABS M,W,F and one the other days     lidocaine -prilocaine  (EMLA ) cream Apply 1 Application topically as needed. 30 g 0   Multiple Vitamin (MULTIVITAMIN) tablet Take 1 tablet by mouth daily.     ondansetron  (ZOFRAN ) 8 MG tablet Take 1 tablet (8 mg total) by mouth every 8 (eight) hours as needed for nausea or vomiting. 20 tablet 1   simvastatin  (ZOCOR ) 20 MG tablet Take 20 mg by mouth every evening.     No current facility-administered medications for this visit.   Facility-Administered Medications Ordered in Other Visits  Medication Dose Route Frequency Provider Last Rate Last Admin   sodium chloride  flush (NS) 0.9 % injection 10 mL  10 mL Intracatheter PRN Zeplin Aleshire T IV, MD        REVIEW OF SYSTEMS:   Constitutional: ( - ) fevers, ( - )  chills , ( - ) night sweats Eyes: ( - ) blurriness of vision, ( - ) double  vision, ( - )  watery eyes Ears, nose, mouth, throat, and face: ( - ) mucositis, ( - ) sore throat Respiratory: ( - ) cough, ( - ) dyspnea, ( - ) wheezes Cardiovascular: ( - ) palpitation, ( - ) chest discomfort, ( - ) lower extremity swelling Gastrointestinal:  ( - ) nausea, ( - ) heartburn, ( - ) change in bowel habits Skin: ( - ) abnormal skin rashes Lymphatics: ( - ) new lymphadenopathy, ( - ) easy bruising Neurological: ( - ) numbness, ( - ) tingling, ( - ) new weaknesses Behavioral/Psych: ( - ) mood change, ( - ) new changes  All other systems were reviewed with the patient and are negative.  PHYSICAL EXAMINATION: ECOG PERFORMANCE STATUS: 1 - Symptomatic but completely ambulatory  Vitals:   01/26/24 0846  BP: 124/68  Pulse: 62  Resp: 13  Temp: 97.7 F (36.5 C)  SpO2: 98%    Filed Weights   01/26/24 0846  Weight: 182 lb 12.8 oz (82.9 kg)     GENERAL: Well-appearing elderly Caucasian male, alert, no distress and comfortable SKIN: skin color, texture, turgor are normal, no rashes or significant lesions EYES: conjunctiva are pink and non-injected, sclera clear EAR: Orange/red colored drainage from inner ear.  LUNGS: clear to auscultation and percussion with normal breathing effort HEART: regular rate & rhythm and no murmurs and no lower extremity edema Musculoskeletal: no cyanosis of digits and no clubbing  PSYCH: alert & oriented x 3, fluent speech NEURO: no focal motor/sensory deficits  LABORATORY DATA:  I have reviewed the data as listed    Latest Ref Rng & Units 01/26/2024    8:12 AM 01/05/2024    7:52 AM 12/15/2023    8:04 AM  CBC  WBC 4.0 - 10.5 K/uL 6.8  6.1  6.6   Hemoglobin 13.0 - 17.0 g/dL 84.4  84.9  84.5   Hematocrit 39.0 - 52.0 % 44.0  42.3  43.6   Platelets 150 - 400 K/uL 209  185  210        Latest Ref Rng & Units 01/26/2024    8:12 AM 01/05/2024    7:52 AM 12/15/2023    8:04 AM  CMP  Glucose 70 - 99 mg/dL 876  840  894   BUN 8 - 23 mg/dL 19   21  16    Creatinine 0.61 - 1.24 mg/dL 8.64  8.74  8.74   Sodium 135 - 145 mmol/L 140  139  140   Potassium 3.5 - 5.1 mmol/L 4.0  3.8  4.0   Chloride 98 - 111 mmol/L 106  108  108   CO2 22 - 32 mmol/L 26  25  27    Calcium 8.9 - 10.3 mg/dL 9.7  9.4  89.7   Total Protein 6.5 - 8.1 g/dL 6.9  6.6  6.9   Total Bilirubin 0.0 - 1.2 mg/dL 0.6  0.7  0.7   Alkaline Phos 38 - 126 U/L 79  69  72   AST 15 - 41 U/L 34  28  24   ALT 0 - 44 U/L 40  31  26     Lab Results  Component Value Date   MPROTEIN Not Observed 09/27/2020   Lab Results  Component Value Date   KPAFRELGTCHN 25.6 (H) 09/27/2020   LAMBDASER 23.0 09/27/2020   KAPLAMBRATIO 1.11 09/27/2020    RADIOGRAPHIC STUDIES: No results found.     ASSESSMENT & PLAN Martin Curry is a 89 y.o. male with medical  history significant for static squamous cell carcinoma of unclear origin as well as basal cell carcinoma behind the right ear who presents for follow-up visit.  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 Previously discussed with radiology who notes a repeat biopsy was not recommended and repeat imaging would be preferred.  --negative multiple myeloma labs with SPEP and serum free light chains showing no evidence of monoclonal gammopathy.  --Given his prior history of prostate cancer we ordered a PSA, which was normal. Repeated PSA previously, found to be within normal limits --CT C/A/P shows no evidence of disease elsewhere in the body in August 2022. Most recent PET CT scan on 10/02/2022 showed hypermetabolic osseous metastasis with a dominant mass centered about the inferior medial portion of the right mastoid, suspicious for site of primary squamous cell carcinoma. -- PET CT scan shows evidence of lesion behind the right ear as well as the known pelvic metastasis. -- Reviewed tissue with pathology to determine if both lesions  are the same pathology or different origins.  At this time findings are most consistent with a basal cell primary behind his ear and squamous cell carcinoma of the pelvis.  It is possible these at the same pathology with the basal/squamous crossover, though they may represent separate primaries.  Either way Cemiplimab  therapy should be adequate to treat both. -- PD-L1 testing showed 0% PD-L1 score, however the use of cemiplimab  does not require PD-L1 positivity (lack of PD-L1 prevents the use of pembrolizumab)  PLAN: --Proceed with cycle 22 day 1 of Cemiplimab  immunotherapy today --Labs today show WBC 6.8, Hgb 15.5, MCV 90, Plt 209  --PET CT scan on 11/01/2023 showed overall stable disease with a mild increase in FDG avidity in hip lesion. New 6 mm FDG avid hip lesion. Next scan in Dec 2025 --Proceed with treatment today without any modifications.  -- Return to clinic for next cycle of cemiplimab  in 3 weeks time.   # Bleeding of the Ear--resolved # Drainage from Ear-- mild  -- secondary due to erosion of the tumor into his ear canal. -- wound has healed without evidence of bleeding.  -- Minimal drainage at this time. -- Advised to wait for repeat PET scan to determine if he is eligible to put his hearing aid back on.   #Thyroid  disease: -- Currently on Synthroid  50 mcg (2 tablets) M/W/F and then 25 mcg on other days.  -- Thyroid  levels are pending.  Will follow-up if any further dose adjustment is needed.  # FDG Avid Colon Lesion --Patient underwent colonoscopy with gastroenterology and found to have large polyps. -- No evidence of colonic malignancy.  No orders of the defined types were placed in this encounter.   All questions were answered. The patient knows to call the clinic with any problems, questions or concerns.  A total of more than 30 minutes were spent on this encounter with face-to-face time and non-face-to-face time, including preparing to see the patient, ordering tests  and/or medications, counseling the patient and coordination of care as outlined above.   Norleen IVAR Kidney, MD Department of Hematology/Oncology [facility] at [facility] Phone: [phone] Pager: [phone] Email: [email or web address]     01/26/2024 2:22 PM

## 2024-01-27 LAB — T4: T4, Total: 8.8 ug/dL (ref 4.5–12.0)

## 2024-01-28 ENCOUNTER — Encounter (HOSPITAL_COMMUNITY)
Admission: RE | Admit: 2024-01-28 | Discharge: 2024-01-28 | Disposition: A | Source: Ambulatory Visit | Attending: Hematology and Oncology

## 2024-01-28 DIAGNOSIS — C7951 Secondary malignant neoplasm of bone: Secondary | ICD-10-CM | POA: Diagnosis not present

## 2024-01-28 DIAGNOSIS — C4492 Squamous cell carcinoma of skin, unspecified: Secondary | ICD-10-CM

## 2024-01-28 LAB — GLUCOSE, CAPILLARY: Glucose-Capillary: 109 mg/dL — ABNORMAL HIGH (ref 70–99)

## 2024-01-28 MED ORDER — FLUDEOXYGLUCOSE F - 18 (FDG) INJECTION
8.0000 | Freq: Once | INTRAVENOUS | Status: AC
  Administered 2024-01-28: 9 via INTRAVENOUS

## 2024-02-16 ENCOUNTER — Inpatient Hospital Stay (HOSPITAL_BASED_OUTPATIENT_CLINIC_OR_DEPARTMENT_OTHER): Admitting: Hematology and Oncology

## 2024-02-16 ENCOUNTER — Inpatient Hospital Stay

## 2024-02-16 VITALS — BP 101/68 | HR 70 | Temp 97.8°F | Resp 15 | Wt 182.8 lb

## 2024-02-16 DIAGNOSIS — C7951 Secondary malignant neoplasm of bone: Secondary | ICD-10-CM

## 2024-02-16 DIAGNOSIS — C4441 Basal cell carcinoma of skin of scalp and neck: Secondary | ICD-10-CM

## 2024-02-16 DIAGNOSIS — C4492 Squamous cell carcinoma of skin, unspecified: Secondary | ICD-10-CM

## 2024-02-16 DIAGNOSIS — Z5112 Encounter for antineoplastic immunotherapy: Secondary | ICD-10-CM

## 2024-02-16 DIAGNOSIS — Z95828 Presence of other vascular implants and grafts: Secondary | ICD-10-CM | POA: Diagnosis not present

## 2024-02-16 LAB — CBC WITH DIFFERENTIAL (CANCER CENTER ONLY)
Abs Immature Granulocytes: 0.04 K/uL (ref 0.00–0.07)
Basophils Absolute: 0 K/uL (ref 0.0–0.1)
Basophils Relative: 0 %
Eosinophils Absolute: 0.2 K/uL (ref 0.0–0.5)
Eosinophils Relative: 2 %
HCT: 43.3 % (ref 39.0–52.0)
Hemoglobin: 15.4 g/dL (ref 13.0–17.0)
Immature Granulocytes: 1 %
Lymphocytes Relative: 16 %
Lymphs Abs: 1.3 K/uL (ref 0.7–4.0)
MCH: 32 pg (ref 26.0–34.0)
MCHC: 35.6 g/dL (ref 30.0–36.0)
MCV: 89.8 fL (ref 80.0–100.0)
Monocytes Absolute: 0.8 K/uL (ref 0.1–1.0)
Monocytes Relative: 9 %
Neutro Abs: 5.8 K/uL (ref 1.7–7.7)
Neutrophils Relative %: 72 %
Platelet Count: 226 K/uL (ref 150–400)
RBC: 4.82 MIL/uL (ref 4.22–5.81)
RDW: 13.2 % (ref 11.5–15.5)
WBC Count: 8.1 K/uL (ref 4.0–10.5)
nRBC: 0 % (ref 0.0–0.2)

## 2024-02-16 LAB — CMP (CANCER CENTER ONLY)
ALT: 32 U/L (ref 0–44)
AST: 27 U/L (ref 15–41)
Albumin: 4.3 g/dL (ref 3.5–5.0)
Alkaline Phosphatase: 85 U/L (ref 38–126)
Anion gap: 10 (ref 5–15)
BUN: 19 mg/dL (ref 8–23)
CO2: 24 mmol/L (ref 22–32)
Calcium: 9.8 mg/dL (ref 8.9–10.3)
Chloride: 107 mmol/L (ref 98–111)
Creatinine: 1.53 mg/dL — ABNORMAL HIGH (ref 0.61–1.24)
GFR, Estimated: 43 mL/min — ABNORMAL LOW
Glucose, Bld: 113 mg/dL — ABNORMAL HIGH (ref 70–99)
Potassium: 4 mmol/L (ref 3.5–5.1)
Sodium: 141 mmol/L (ref 135–145)
Total Bilirubin: 0.6 mg/dL (ref 0.0–1.2)
Total Protein: 7 g/dL (ref 6.5–8.1)

## 2024-02-16 LAB — TSH: TSH: 4.64 u[IU]/mL — ABNORMAL HIGH (ref 0.350–4.500)

## 2024-02-16 MED ORDER — SODIUM CHLORIDE 0.9 % IV SOLN: Freq: Once | INTRAVENOUS | Status: AC

## 2024-02-16 MED ORDER — SODIUM CHLORIDE 0.9 % IV SOLN
350.0000 mg | Freq: Once | INTRAVENOUS | Status: AC
  Administered 2024-02-16: 350 mg via INTRAVENOUS
  Filled 2024-02-16: qty 7

## 2024-02-16 NOTE — Progress Notes (Signed)
 " [facility] [phone]   [phone]  PROGRESS NOTE  Patient Care Team: Yolande Toribio MATSU, MD as PCP - General (Internal Medicine) Watt Rush, MD as Attending Physician (Urology) Izell Domino, MD as Consulting Physician (Radiation Oncology) Malmfelt, Delon CROME, RN as Oncology Nurse Navigator (Oncology) Lynnell Nottingham, MD as Consulting Physician (Dermatology) Federico Rosario BROCKS, MD as Consulting Physician (Gastroenterology) Federico Rush ONEIDA MADISON, MD as Consulting Physician (Hematology and Oncology)  Hematological/Oncological History # Bone Lesions on CT/MRI # 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 Additionally there was noted to be a new enhancing lesion at the tip of the right greater trochanter measuring up to 1.7 cm also suspicious for metastatic disease. 09/27/2020: establish care with Dr. Federico 10/29/2020: CT biopsy performed of left iliac bone lesion. Pathology showed bone and marrow without evidence of carcinoma.  11/11/2022: Cycle 1 Day 1 of cemiplimab  therapy.  12/03/2022: Cycle 2 Day 1 of cemiplimab  therapy.  12/23/2022: Cycle 3 Day 1 of cemiplimab  therapy.  01/13/2023: Cycle 4 Day 1 of cemiplimab  therapy.  02/03/2023: Cycle 5 Day 1 of cemiplimab  therapy.  02/23/2023: Cycle 6 Day 1 of cemiplimab  therapy.  03/16/2023:  Cycle 7 Day 1 of cemiplimab  therapy.  04/06/2023: Cycle 8 Day 1 of cemiplimab  therapy.  04/28/2023: Cycle 9 Day 1 of cemiplimab  therapy.  05/18/2023: Cycle 10 Day 1 of cemiplimab  therapy.  06/08/2023: Cycle 11 Day 1 of cemiplimab  therapy.  06/30/2023: Cycle 12 Day 1 of cemiplimab  therapy.  07/21/2023: Cycle 13 Day 1 of  cemiplimab  therapy.  08/11/2023: Cycle 14 Day 1 of cemplimab therapy.  09/02/2023: Cycle 15 Day 1 of cemiplimab  therapy.  09/23/2023: Cycle 16 Day 1 of cemiplimab  therapy.  10/13/2023: Cycle 17 Day 1 of cemiplimab  therapy.  11/04/2023: Cycle 18 Day 1 of cemiplimab  therapy.  12/15/2023: Cycle 19 Day 1 of cemiplimab  therapy.  01/05/2024: Cycle 20 Day 1 of cemiplimab  therapy.   Interval History:  Martin Curry 89 y.o. 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 The patient's last visit was on 01/28/2024. In the interim since the last visit he has completed Cycle 23 of cemiplimab .  On exam today Martin Curry reports he has been feeling good overall in interim since her last visit with no discharge from his ear.  He reports that he is not currently having any bone or back pain though if he lays in bed long enough he can have some lower back soreness.  He reports that he has had no issues with fevers, chills, sweats, nausea, or diarrhea.  He is tolerating his treatment well with no major side effects.  Overall he feels well and is willing and able to proceed with treatment today.  Full 10 point ROS is otherwise negative.  The bulk of our discussion focused on the results of his PET CT scan and steps moving forward.  We discussed focal radiation therapy for his continued observation.  He opted for radiation therapy.   MEDICAL HISTORY:  Past Medical History:  Diagnosis Date   Atypical nevus 09/07/2002   left abdomen-slight   BCC (basal cell  carcinoma of skin) 07/19/2014   left sideburn- +margin-exc.   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 if orignated from skin cancer   Nodular basal cell carcinoma (BCC) 11/21/2012   right side of scalp-CX35FU   Nodular basal cell carcinoma (BCC) 06/12/2014   left sideburn-CX35FU/exc.   Nodular basal cell carcinoma (BCC) 07/16/2015   left scalp-CX35FU   Polycythemia    Prostate cancer (HCC) 1990's   total prostatectomy and adjuvant radiation. Last PSA was 0.02 in 09/2011   SCC (squamous cell carcinoma) 04/03/2009   forehead-txpbx   SCC (squamous cell carcinoma) 07/16/2015   left front scalp   SCC (squamous cell carcinoma) 07/16/2015   left front scalp-Cx35FU   SCCA (squamous cell carcinoma) of skin 12/20/2019   Right forehead (in situ)   SCCA (squamous cell carcinoma) of skin 05/27/2020   Mid Parietal Scalp (in situ)   Superficial basal cell carcinoma (BCC) 11/21/2012   behind left ear-CX35FU, midback-CX35FU, right forehead-CX35FU-exc   Superficial basal cell carcinoma (BCC) 11/02/2017   left post neck-CX35FU   Superficial nodular basal cell carcinoma (BCC) 12/20/2019   Left temporal scalp   Superficial nodular basal cell carcinoma (BCC) 05/27/2020   Right Preauricular area (curet and 5FU)    SURGICAL HISTORY: Past Surgical History:  Procedure Laterality Date   ADJACENT TISSUE TRANSFER/TISSUE REARRANGEMENT N/A 05/22/2019   Procedure: COMPLEX CLOSURE OF NECK WOUND;  Surgeon: Elisabeth Craig RAMAN, MD;  Location: [facility] OR;  Service: Plastics;  Laterality: N/A;   ALLOGRAFT APPLICATION N/A 05/22/2019   Procedure: POSSIBLE FACIAL NERVE RECONSTRUCTION WITH NERVE ALLOGRAFT VS AUTOGRAFT;  Surgeon: Elisabeth Craig RAMAN, MD;  Location: [facility] OR;  Service: Plastics;  Laterality: N/A;   BLADDER SURGERY     due to prostatectomy.    CATARACT EXTRACTION     right    COLONOSCOPY     neg in the past; due in 2014 with [place] Dr. Jakie   EYE SURGERY     1950   HERNIA REPAIR     IR IMAGING GUIDED PORT INSERTION  11/30/2022   MOHS SURGERY     NECK DISSECTION  05/22/2019    NECK DISSECTION   PAROTIDECTOMY  05/22/2019   PAROTIDECTOMY with facial nerve dissection    PAROTIDECTOMY Right 05/22/2019   Procedure: PAROTIDECTOMY;  Surgeon: Jesus Oliphant, MD;  Location: [facility] OR;  Service: ENT;  Laterality: Right;   PROSTATE SURGERY     RADICAL NECK DISSECTION Right 05/22/2019   Procedure: RADICAL NECK DISSECTION;  Surgeon: Jesus Oliphant, MD;  Location: [facility];  Service: ENT;  Laterality: Right;   TONSILLECTOMY      SOCIAL HISTORY: Social History   Socioeconomic History   Marital status: Married    Spouse name: Not on file   Number of children: 3   Years of education: Not on file   Highest education level: Not on file  Occupational History    Comment: retired community education officer  company; 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   Occupation: retired  Tobacco Use   Smoking status: Never   Smokeless tobacco: Never  Vaping Use   Vaping status: Never Used  Substance and Sexual Activity   Alcohol  use: No   Drug use: No   Sexual activity: Not on file  Other Topics Concern   Not on file  Social History Narrative   Not on file   Social Drivers of Health   Tobacco Use: Low Risk (08/03/2023)   Patient History    Smoking Tobacco Use: Never    Smokeless Tobacco Use: Never    Passive Exposure: Not on file  Financial Resource Strain: Not on file  Food Insecurity: No Food Insecurity (10/29/2022)   Hunger Vital Sign    Worried About Running Out of Food in the Last Year: Never true    Ran Out of Food in the Last Year: Never true  Transportation Needs: No Transportation Needs (10/29/2022)   PRAPARE - Administrator, Civil Service (Medical): No    Lack of Transportation (Non-Medical): No  Physical Activity: Not on file  Stress: Not on file  Social Connections: Not on file   Intimate Partner Violence: Not At Risk (10/29/2022)   Humiliation, Afraid, Rape, and Kick questionnaire    Fear of Current or Ex-Partner: No    Emotionally Abused: No    Physically Abused: No    Sexually Abused: No  Depression (PHQ2-9): Low Risk (02/16/2024)   Depression (PHQ2-9)    PHQ-2 Score: 0  Alcohol  Screen: Low Risk (10/29/2022)   Alcohol  Screen    Last Alcohol  Screening Score (AUDIT): 0  Housing: Low Risk (10/29/2022)   Housing    Last Housing Risk Score: 0  Utilities: Not At Risk (10/29/2022)   AHC Utilities    Threatened with loss of utilities: No  Health Literacy: Adequate Health Literacy (10/29/2022)   B1300 Health Literacy    Frequency of need for help with medical instructions: Never    FAMILY HISTORY: Family History  Problem Relation Age of Onset   Uterine cancer Mother 51   Ovarian cancer Mother    Lung cancer Father 71   Prostate cancer Brother    Prostate cancer Brother    Colon cancer Neg Hx    Stomach cancer Neg Hx    Esophageal cancer Neg Hx    Rectal cancer Neg Hx     ALLERGIES:  is allergic to methylprednisolone, penicillins, latex, and tape.  MEDICATIONS:  Current Outpatient Medications  Medication Sig Dispense Refill   amLODipine  (NORVASC ) 5 MG tablet Take 5 mg by mouth daily.      aspirin  81 MG tablet Take 81 mg by mouth daily.     benazepril  (LOTENSIN ) 10 MG tablet Take 10 mg by mouth daily.     famotidine -calcium carbonate-magnesium hydroxide (PEPCID  COMPLETE) 10-800-165 MG chewable tablet Take one tablet PRN     levothyroxine  (SYNTHROID ) 25 MCG tablet Take 25 mcg by mouth daily before breakfast. 2 tABS M,W,F and one the other days     lidocaine -prilocaine  (EMLA ) cream Apply 1 Application topically as needed. 30 g 0   Multiple Vitamin (MULTIVITAMIN) tablet Take 1 tablet by mouth daily.     ondansetron  (ZOFRAN ) 8 MG tablet Take 1 tablet (8 mg total) by mouth every 8 (eight) hours as needed for nausea or vomiting. 20 tablet 1   simvastatin  (ZOCOR )  20 MG tablet Take 20 mg by mouth every evening.     No current facility-administered medications for  this visit.   Facility-Administered Medications Ordered in Other Visits  Medication Dose Route Frequency Provider Last Rate Last Admin   cemiplimab -rwlc (LIBTAYO ) 350 mg in sodium chloride  0.9 % 100 mL chemo infusion  350 mg Intravenous Once Karrah Mangini T IV, MD        REVIEW OF SYSTEMS:   Constitutional: ( - ) fevers, ( - )  chills , ( - ) night sweats Eyes: ( - ) blurriness of vision, ( - ) double vision, ( - ) watery eyes Ears, nose, mouth, throat, and face: ( - ) mucositis, ( - ) sore throat Respiratory: ( - ) cough, ( - ) dyspnea, ( - ) wheezes Cardiovascular: ( - ) palpitation, ( - ) chest discomfort, ( - ) lower extremity swelling Gastrointestinal:  ( - ) nausea, ( - ) heartburn, ( - ) change in bowel habits Skin: ( - ) abnormal skin rashes Lymphatics: ( - ) new lymphadenopathy, ( - ) easy bruising Neurological: ( - ) numbness, ( - ) tingling, ( - ) new weaknesses Behavioral/Psych: ( - ) mood change, ( - ) new changes  All other systems were reviewed with the patient and are negative.  PHYSICAL EXAMINATION: ECOG PERFORMANCE STATUS: 1 - Symptomatic but completely ambulatory  Vitals:   02/16/24 0816  BP: 101/68  Pulse: 70  Resp: 15  Temp: 97.8 F (36.6 C)  SpO2: 100%    Filed Weights   02/16/24 0816  Weight: 182 lb 12.8 oz (82.9 kg)     GENERAL: Well-appearing elderly Caucasian male, alert, no distress and comfortable SKIN: skin color, texture, turgor are normal, no rashes or significant lesions EYES: conjunctiva are pink and non-injected, sclera clear EAR: Orange/red colored drainage from inner ear.  LUNGS: clear to auscultation and percussion with normal breathing effort HEART: regular rate & rhythm and no murmurs and no lower extremity edema Musculoskeletal: no cyanosis of digits and no clubbing  PSYCH: alert & oriented x 3, fluent speech NEURO: no focal  motor/sensory deficits  LABORATORY DATA:  I have reviewed the data as listed    Latest Ref Rng & Units 02/16/2024    7:59 AM 01/26/2024    8:12 AM 01/05/2024    7:52 AM  CBC  WBC 4.0 - 10.5 K/uL 8.1  6.8  6.1   Hemoglobin 13.0 - 17.0 g/dL 84.5  84.4  84.9   Hematocrit 39.0 - 52.0 % 43.3  44.0  42.3   Platelets 150 - 400 K/uL 226  209  185        Latest Ref Rng & Units 02/16/2024    7:59 AM 01/26/2024    8:12 AM 01/05/2024    7:52 AM  CMP  Glucose 70 - 99 mg/dL 886  876  840   BUN 8 - 23 mg/dL 19  19  21    Creatinine 0.61 - 1.24 mg/dL 8.46  8.64  8.74   Sodium 135 - 145 mmol/L 141  140  139   Potassium 3.5 - 5.1 mmol/L 4.0  4.0  3.8   Chloride 98 - 111 mmol/L 107  106  108   CO2 22 - 32 mmol/L 24  26  25    Calcium 8.9 - 10.3 mg/dL 9.8  9.7  9.4   Total Protein 6.5 - 8.1 g/dL 7.0  6.9  6.6   Total Bilirubin 0.0 - 1.2 mg/dL 0.6  0.6  0.7   Alkaline Phos 38 - 126 U/L 85  79  69  AST 15 - 41 U/L 27  34  28   ALT 0 - 44 U/L 32  40  31     Lab Results  Component Value Date   MPROTEIN Not Observed 09/27/2020   Lab Results  Component Value Date   KPAFRELGTCHN 25.6 (H) 09/27/2020   LAMBDASER 23.0 09/27/2020   KAPLAMBRATIO 1.11 09/27/2020    RADIOGRAPHIC STUDIES: NM PET Image Restage (PS) Whole Body Result Date: 02/01/2024 CLINICAL DATA:  Subsequent treatment strategy for metastatic squamous cell carcinoma of the skin. Post immunotherapy. EXAM: NUCLEAR MEDICINE PET WHOLE BODY TECHNIQUE: 9.0 mCi F-18 FDG was injected intravenously. Full-ring PET imaging was performed from the head to foot after the radiotracer. CT data was obtained and used for attenuation correction and anatomic localization. Fasting blood glucose: 109 mg/dl COMPARISON:  PET-CT 90/91/7974 and 07/30/2023. FINDINGS: Mediastinal blood pool activity: SUV max 2.4 HEAD/ NECK: There are stable postsurgical changes from previous right radical neck dissection. There is similar low level soft tissue activity in this area,  likely postsurgical. No hypermetabolic cervical lymph nodes are identified. No suspicious activity identified within the pharyngeal mucosal space. Incidental CT findings: Bilateral carotid atherosclerosis. CHEST: Again demonstrated are multiple small hypermetabolic mediastinal and hilar lymph nodes bilaterally. These are similar to the previous PET-CT. For example, a subcarinal node has an SUV max of 9.4 (previously 11.5). A right hilar node has an SUV max of 7.6 (previously 8.3) and a left hilar node has an SUV max of 7.0 (previously 8.7). No axillary adenopathy. No hypermetabolic pulmonary activity or suspicious nodularity. Incidental CT findings: Stable mild scarring at both lung apices. Right IJ Port-A-Cath extends to the superior cavoatrial junction. There is atherosclerosis of the aorta, great vessels and coronary arteries. Stable 2.7 cm right thyroid  nodule with low level metabolic activity (SUV max 4.5). ABDOMEN/PELVIS: There is no hypermetabolic activity within the liver, adrenal glands, spleen or pancreas. There is no hypermetabolic nodal activity in the abdomen or pelvis. Prominent metabolic activity again noted in the perineum, likely due to urine contamination. Incidental CT findings: Aortic and branch vessel atherosclerosis. Diverticular changes throughout the distal colon with stable left inguinal hernia containing a portion of the sigmoid colon. No evidence of incarceration or bowel obstruction. Stable renal cysts bilaterally for which no specific follow-up imaging is recommended. Status post prostatectomy. SKELETON: Similar hypermetabolic osseous lesions, including a lesion in the T11 spinous process (SUV max 5.4), lytic lesion in the left iliac wing measuring 1.1 cm on image 183/4 (SUV max 7.8, previously 8.8; this lesion may be slightly larger), and a lesion posteriorly in the right sacrum (SUV max 10.6, previously 5.9). No definite new lesions. Stable sclerotic lesions in the spine without  hypermetabolic activity. Incidental CT findings: Mild spondylosis. Stable chronic sclerosis and destruction of the right mastoid air cells with low level metabolic activity. EXTREMITIES: No suspicious hypermetabolic activity within the extremities. Partial extravasation of the radiopharmaceutical at the injection site in the right antecubital fossa. Incidental CT findings: none IMPRESSION: 1. Similar appearance of the chronic, small hypermetabolic mediastinal and hilar lymph nodes. Based on long-term stability, these are likely reactive. 2. Slight progression of osseous metastatic disease with slightly increased metabolic activity in lesions involving the right sacrum and left iliac crest. Multiple other sclerotic osseous lesions without hypermetabolic activity are grossly stable. 3. Stable postsurgical changes in the right neck without evidence of local recurrence. 4. Stable right thyroid  nodule with low level metabolic activity. 5. Stable left inguinal hernia containing a portion of the  sigmoid colon. No evidence of incarceration or bowel obstruction. 6.  Aortic Atherosclerosis (ICD10-I70.0). Electronically Signed   By: Elsie Perone [name]   On: 02/01/2024 16:52       ASSESSMENT & PLAN BRYSUN ESCHMANN is a 89 y.o. 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 Previously discussed with radiology who notes a repeat biopsy was not recommended and repeat imaging would be preferred.  --negative multiple myeloma labs with SPEP and serum free light chains showing no evidence of monoclonal gammopathy.  --Given his prior history of prostate cancer we ordered a PSA, which was normal. Repeated PSA previously, found to be within  normal limits --CT C/A/P shows no evidence of disease elsewhere in the body in August 2022. Most recent PET CT scan on 10/02/2022 showed hypermetabolic osseous metastasis with a dominant mass centered about the inferior medial portion of the right mastoid, suspicious for site of primary squamous cell carcinoma. -- PET CT scan shows evidence of lesion behind the right ear as well as the known pelvic metastasis. -- Reviewed tissue with pathology to determine if both lesions are the same pathology or different origins.  At this time findings are most consistent with a basal cell primary behind his ear and squamous cell carcinoma of the pelvis.  It is possible these at the same pathology with the basal/squamous crossover, though they may represent separate primaries.  Either way Cemiplimab  therapy should be adequate to treat both. -- PD-L1 testing showed 0% PD-L1 score, however the use of cemiplimab  does not require PD-L1 positivity (lack of PD-L1 prevents the use of pembrolizumab)  PLAN: --Proceed with cycle 23 day 1 of Cemiplimab  immunotherapy today --Labs today show WBC 8.1, hemoglobin 15.4, MCV 89.8, platelets 226. --PET CT scan on 01/28/2024 showed overall stable disease with continued mild increase in FDG avidity in hip lesion.  --would recommend discussion with radiation oncology for consideration of treatment of the 2 active sites of disease in the pelvis.  --Proceed with treatment today without any modifications.  -- Return to clinic for next cycle of cemiplimab  in 3 weeks time.   # Bleeding of the Ear--resolved # Drainage from Ear-- mild  -- secondary due to erosion of the tumor into his ear canal. -- wound has healed without evidence of bleeding.  -- No drainage at this time. -- Advised to wait for repeat PET scan to determine if he is eligible to put his hearing aid back on.   #Thyroid  disease: -- Currently on Synthroid  50 mcg (2 tablets) M/W/F and then 25 mcg on other days.  -- Thyroid   levels are pending.  Will follow-up if any further dose adjustment is needed.  # FDG Avid Colon Lesion --Patient underwent colonoscopy with gastroenterology and found to have large polyps. -- No evidence of colonic malignancy.  Orders Placed This Encounter  Procedures   CBC with Differential (Cancer Center Only)    Standing Status:   Future    Expected Date:   05/10/2024    Expiration Date:   05/10/2025   CMP (Cancer Center only)    Standing Status:   Future    Expected Date:   05/10/2024    Expiration Date:   05/10/2025   T4  Standing Status:   Future    Expected Date:   05/10/2024    Expiration Date:   05/10/2025   TSH    Standing Status:   Future    Expected Date:   05/10/2024    Expiration Date:   05/10/2025   Ambulatory referral to Radiation Oncology    Referral Priority:   Routine    Referral Type:   Consultation    Referral Reason:   Specialty Services Required    Requested Specialty:   Radiation Oncology    Number of Visits Requested:   1    All questions were answered. The patient knows to call the clinic with any problems, questions or concerns.  A total of more than 30 minutes were spent on this encounter with face-to-face time and non-face-to-face time, including preparing to see the patient, ordering tests and/or medications, counseling the patient and coordination of care as outlined above.   Norleen IVAR Kidney, MD Department of Hematology/Oncology [facility] Phone: [phone] Pager: [phone] Email: [email or web address]  02/16/2024 9:17 AM "

## 2024-02-16 NOTE — Patient Instructions (Signed)
 [facility] - A DEPT OF [facility]  Discharge Instructions: Thank you for choosing [facility] to provide your oncology and hematology care.   If you have a lab appointment with the Cancer Center, please go directly to the Cancer Center and check in at the registration area.   Wear comfortable clothing and clothing appropriate for easy access to any Portacath or PICC line.   We strive to give you quality time with your provider. You may need to reschedule your appointment if you arrive late (15 or more minutes).  Arriving late affects you and other patients whose appointments are after yours.  Also, if you miss three or more appointments without notifying the office, you may be dismissed from the clinic at the provider's discretion.      For prescription refill requests, have your pharmacy contact our office and allow 72 hours for refills to be completed.    Today you received the following chemotherapy and/or immunotherapy agents: cemiplimab -rwlc (LIBTAYO )    To help prevent nausea and vomiting after your treatment, we encourage you to take your nausea medication as directed.  BELOW ARE SYMPTOMS THAT SHOULD BE REPORTED IMMEDIATELY: *FEVER GREATER THAN 100.4 F (38 C) OR HIGHER *CHILLS OR SWEATING *NAUSEA AND VOMITING THAT IS NOT CONTROLLED WITH YOUR NAUSEA MEDICATION *UNUSUAL SHORTNESS OF BREATH *UNUSUAL BRUISING OR BLEEDING *URINARY PROBLEMS (pain or burning when urinating, or frequent urination) *BOWEL PROBLEMS (unusual diarrhea, constipation, pain near the anus) TENDERNESS IN MOUTH AND THROAT WITH OR WITHOUT PRESENCE OF ULCERS (sore throat, sores in mouth, or a toothache) UNUSUAL RASH, SWELLING OR PAIN  UNUSUAL VAGINAL DISCHARGE OR ITCHING   Items with * indicate a potential emergency and should be followed up as soon as possible or go to the Emergency Department if any problems should occur.  Please show the CHEMOTHERAPY ALERT CARD or  IMMUNOTHERAPY ALERT CARD at check-in to the Emergency Department and triage nurse.  Should you have questions after your visit or need to cancel or reschedule your appointment, please contact [facility] - [facility] DE[facility]  Dept: [phone]  and follow the prompts.  Office hours are 8:00 a.m. to 4:30 p.m. Monday - Friday. Please note that voicemails left after 4:00 p.m. may not be returned until the following business day.  We are closed weekends and major holidays. You have access to a nurse at all times for urgent questions. Please call the main number to the clinic Dept: [phone] and follow the prompts.   For any non-urgent questions, you may also contact your provider using MyChart. We now offer e-Visits for anyone 58 and older to request care online for non-urgent symptoms. For details visit [email or web address].   Also download the MyChart app! Go to the app store, search MyChart, open the app, select [place], and log in with your MyChart username and password.

## 2024-02-17 ENCOUNTER — Other Ambulatory Visit: Payer: Self-pay

## 2024-02-17 LAB — T4: T4, Total: 8.2 ug/dL (ref 4.5–12.0)

## 2024-02-24 ENCOUNTER — Encounter: Payer: Self-pay | Admitting: Hematology and Oncology

## 2024-02-29 NOTE — Progress Notes (Signed)
 " Radiation Oncology         [phone] ________________________________  Initial outpatient Consultation  Name: Martin Curry MRN: [id number]  Date: 03/01/2024  DOB: 01/21/1933  RR:Ejuzmdnw, Toribio MATSU, MD  Federico Norleen ONEIDA MADISON, MD   REFERRING PHYSICIAN: Federico Norleen ONEIDA MADISON, MD  DIAGNOSIS: No diagnosis found.   Cancer Staging  Basal cell carcinoma (BCC) of skin of neck Staging form: Cutaneous Carcinoma of the Head and Neck, AJCC 8th Edition - Clinical stage from 05/02/2019: Stage III (cT3, cN0, cM0) - Signed by Izell Domino, MD on 05/03/2019 Stage prefix: Initial diagnosis Extraosseous extension: Absent - Pathologic stage from 06/16/2019: Stage III (pT3, pN1, cM0) - Signed by Izell Domino, MD on 06/20/2019 Extraosseous extension: Absent   CHIEF COMPLAINT: Here to discuss management of pelvic bone cancer  HISTORY OF PRESENT ILLNESS::Martin Curry is a 89 y.o. 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 He is known to me for his history of radiation for management of osseous metastatic disease (SCC) to the left sacrum and a right mastoid mass. He was last seen in office on 10/30/22 for a reconsultation visit.   Most recent surveillance CT scan on 01/28/2024 showed overall stable disease with continued mild increase in FDG avidity in hip lesion.    In light of findings, he then underwent a restaging PET scan performed on 02/01/24 showed similar appearance of the chronic, small hypermetabolic mediastinal and hilar lymph nodes. Based on long-term stability, these are likely reactive. Scan also indicated slight progression of osseous metastatic disease with slightly increased metabolic activity in lesions involving the right sacrum and left iliac crest. Multiple other sclerotic osseous lesions without hypermetabolic activity are grossly stable. Other findings include stable postsurgical changes in the right neck without  evidence of local recurrence and stable right thyroid  nodule with low level metabolic activity.    Therefore, he followed up with Dr. Federico on 02/16/24 where they discussed several options to manage new slight progression. Patient opted to proceed with radiation therapy and observation. Patient will aslo proceed with current regimen and will undergo his 23rd cycle of Cemiplimab  immunotherapy. Most recent dose on 02/16/24.   PREVIOUS RADIATION THERAPY: Yes *** Intent: Curative  Radiation Treatment Dates: 06/28/2019 through 07/26/2019   Site Technique Total Dose (Gy) Dose per Fx (Gy) Completed Fx Beam Energies  Neck Right: HN_Rt IMRT 54/54 2.7 20/20 6X    PAST MEDICAL HISTORY:  has a past medical history of Atypical nevus (09/07/2002), BCC (basal cell carcinoma of skin) (07/19/2014), BCC (basal cell carcinoma) (09/07/2002), BCC (basal cell carcinoma) (10/30/2003), BCC (basal cell carcinoma) (01/07/2009), BCC (basal cell carcinoma) (09/27/2014), BCC (basal cell carcinoma) (07/16/2015), Blind left eye, Cataract, History of kidney stones, Hyperlipidemia, Hypertension, Hypothyroidism, Metastasis to bone [facility]), Nodular basal cell carcinoma (BCC) (11/21/2012), Nodular basal cell carcinoma (BCC) (06/12/2014), Nodular basal cell carcinoma (BCC) (07/16/2015), Polycythemia, Prostate cancer (HCC) (1990's), SCC (squamous cell carcinoma) (04/03/2009), SCC (squamous cell carcinoma) (07/16/2015), SCC (squamous cell carcinoma) (07/16/2015), SCCA (squamous cell carcinoma) of skin (12/20/2019), SCCA (squamous cell carcinoma) of skin (05/27/2020), Superficial basal cell carcinoma (BCC) (11/21/2012), Superficial basal cell carcinoma (BCC) (11/02/2017), Superficial nodular basal cell carcinoma (BCC) (12/20/2019), and Superficial nodular basal cell carcinoma (BCC) (05/27/2020).    PAST SURGICAL HISTORY: Past Surgical History:  Procedure Laterality Date   ADJACENT TISSUE TRANSFER/TISSUE REARRANGEMENT N/A 05/22/2019    Procedure: COMPLEX CLOSURE OF NECK WOUND;  Surgeon: Elisabeth Craig RAMAN,  MD;  Location: [facility] OR;  Service: Plastics;  Laterality: N/A;   ALLOGRAFT APPLICATION N/A 05/22/2019   Procedure: POSSIBLE FACIAL NERVE RECONSTRUCTION WITH NERVE ALLOGRAFT VS AUTOGRAFT;  Surgeon: Elisabeth Craig RAMAN, MD;  Location: [facility] OR;  Service: Plastics;  Laterality: N/A;   BLADDER SURGERY     due to prostatectomy.    CATARACT EXTRACTION     right   COLONOSCOPY     neg in the past; due in 2014 with  Dr. Jakie   EYE SURGERY     1950   HERNIA REPAIR     IR IMAGING GUIDED PORT INSERTION  11/30/2022   MOHS SURGERY     NECK DISSECTION  05/22/2019    NECK DISSECTION   PAROTIDECTOMY  05/22/2019   PAROTIDECTOMY with facial nerve dissection    PAROTIDECTOMY Right 05/22/2019   Procedure: PAROTIDECTOMY;  Surgeon: Jesus Oliphant, MD;  Location: [facility] OR;  Service: ENT;  Laterality: Right;   PROSTATE SURGERY     RADICAL NECK DISSECTION Right 05/22/2019   Procedure: RADICAL NECK DISSECTION;  Surgeon: Jesus Oliphant, MD;  Location: [facility] OR;  Service: ENT;  Laterality: Right;   TONSILLECTOMY      FAMILY HISTORY: family history includes Lung cancer (age of onset: 37) in his father; Ovarian cancer in his mother; Prostate cancer in his brother and brother; Uterine cancer (age of onset: 10) in his mother.  SOCIAL HISTORY:  reports that he has never smoked. He has never used smokeless tobacco. He reports that he does not drink alcohol  and does not use drugs.  ALLERGIES: Methylprednisolone, Penicillins, Latex, and Tape  MEDICATIONS:  Current Outpatient Medications  Medication Sig Dispense Refill   amLODipine  (NORVASC ) 5 MG tablet Take 5 mg by mouth daily.      aspirin  81 MG tablet Take 81 mg by mouth daily.     benazepril  (LOTENSIN ) 10 MG tablet Take 10 mg by mouth daily.     famotidine -calcium carbonate-magnesium hydroxide (PEPCID  COMPLETE) [phone] MG chewable tablet Take one tablet PRN     levothyroxine  (SYNTHROID ) 25 MCG  tablet Take 25 mcg by mouth daily before breakfast. 2 tABS M,W,F and one the other days     lidocaine -prilocaine  (EMLA ) cream Apply 1 Application topically as needed. 30 g 0   Multiple Vitamin (MULTIVITAMIN) tablet Take 1 tablet by mouth daily.     ondansetron  (ZOFRAN ) 8 MG tablet Take 1 tablet (8 mg total) by mouth every 8 (eight) hours as needed for nausea or vomiting. 20 tablet 1   simvastatin  (ZOCOR ) 20 MG tablet Take 20 mg by mouth every evening.     No current facility-administered medications for this visit.    REVIEW OF SYSTEMS:  Notable for that above.   PHYSICAL EXAM:  vitals were not taken for this visit.   General: Alert and oriented, in no acute distress *** HEENT: Head is normocephalic. Extraocular movements are intact. Oropharynx is clear. Neck: Neck is supple, no palpable cervical or supraclavicular lymphadenopathy. Heart: Regular in rate and rhythm with no murmurs, rubs, or gallops. Chest: Clear to auscultation bilaterally, with no rhonchi, wheezes, or rales. Abdomen: Soft, nontender, nondistended, with no rigidity or guarding. Extremities: No cyanosis or edema. Lymphatics: see Neck Exam Skin: No concerning lesions. Musculoskeletal: symmetric strength and muscle tone throughout. Neurologic: Cranial nerves II through XII are grossly intact. No obvious focalities. Speech is fluent. Coordination is intact. Psychiatric: Judgment and insight are intact. Affect is appropriate.   ECOG = ***  0 - Asymptomatic (Fully active,  able to carry on all predisease activities without restriction)  1 - Symptomatic but completely ambulatory (Restricted in physically strenuous activity but ambulatory and able to carry out work of a light or sedentary nature. For example, light housework, office work)  2 - Symptomatic, <50% in bed during the day (Ambulatory and capable of all self care but unable to carry out any work activities. Up and about more than 50% of waking hours)  3 -  Symptomatic, >50% in bed, but not bedbound (Capable of only limited self-care, confined to bed or chair 50% or more of waking hours)  4 - Bedbound (Completely disabled. Cannot carry on any self-care. Totally confined to bed or chair)  5 - Death   Raylene MM, Creech RH, Tormey DC, et al. [phone]). Toxicity and response criteria of the [facility]. Am. DOROTHA Bridges. Oncol. 5 (6): 649-55   LABORATORY DATA:  Lab Results  Component Value Date   WBC 8.1 02/16/2024   HGB 15.4 02/16/2024   HCT 43.3 02/16/2024   MCV 89.8 02/16/2024   PLT 226 02/16/2024   CMP     Component Value Date/Time   NA 141 02/16/2024 0759   NA 141 11/18/2011 1046   K 4.0 02/16/2024 0759   K 4.3 11/18/2011 1046   CL 107 02/16/2024 0759   CL 109 (H) 11/18/2011 1046   CO2 24 02/16/2024 0759   CO2 25 11/18/2011 1046   GLUCOSE 113 (H) 02/16/2024 0759   GLUCOSE 89 11/18/2011 1046   BUN 19 02/16/2024 0759   BUN 17.0 11/18/2011 1046   CREATININE 1.53 (H) 02/16/2024 0759   CREATININE 1.3 11/18/2011 1046   CALCIUM 9.8 02/16/2024 0759   CALCIUM 10.1 11/18/2011 1046   PROT 7.0 02/16/2024 0759   PROT 6.8 11/18/2011 1046   ALBUMIN 4.3 02/16/2024 0759   ALBUMIN 4.1 11/18/2011 1046   AST 27 02/16/2024 0759   AST 32 11/18/2011 1046   ALT 32 02/16/2024 0759   ALT 48 11/18/2011 1046   ALKPHOS 85 02/16/2024 0759   ALKPHOS 79 11/18/2011 1046   BILITOT 0.6 02/16/2024 0759   BILITOT 0.70 11/18/2011 1046   GFRNONAA 43 (L) 02/16/2024 0759         RADIOGRAPHY: No results found.    IMPRESSION/PLAN:***    On date of service, in total, I spent *** minutes on this encounter. Patient was seen in person.   __________________________________________   Lauraine Golden, MD  This document serves as a record of services personally performed by Lauraine Golden, MD. It was created on her behalf by Reymundo Cartwright, a trained medical scribe. The creation of this record is based on the scribe's personal observations and  the provider's statements to them. This document has been checked and approved by the attending provider.  "

## 2024-02-29 NOTE — Progress Notes (Signed)
 Histology and Location of Primary Cancer: Metastatic squamous cell cancer to the pelvic bone.   Location(s) of Symptomatic Metastases: Pelvis  Past/Anticipated chemotherapy by medical oncology, if any:  Dr. Federico 02/16/2024 -PET CT scan on 01/28/2024 showed overall stable disease with continued mild increase in FDG avidity in hip lesion.  -would recommend discussion with radiation oncology for consideration of treatment of the 2 active sites of disease in the pelvis.  -Proceed with treatment today without any modifications.   Pain on a scale of 0-10 is:  0/10 not at this time.  Ambulatory status? Walker? Wheelchair?: Independent  SAFETY ISSUES: Prior radiation? Yes, prostate 1990's and Head & neck with Dr. Lauraine Golden Pacemaker/ICD? No Possible current pregnancy? N/A Is the patient on methotrexate? No  Current Complaints / other details:  None

## 2024-03-01 ENCOUNTER — Ambulatory Visit
Admission: RE | Admit: 2024-03-01 | Discharge: 2024-03-01 | Disposition: A | Discharge status: Home / Self Care | Source: Ambulatory Visit | Attending: Radiation Oncology | Admitting: Radiation Oncology

## 2024-03-01 ENCOUNTER — Encounter: Payer: Self-pay | Admitting: Radiation Oncology

## 2024-03-01 VITALS — BP 145/87 | HR 79 | Temp 97.8°F | Resp 18 | Ht 69.0 in | Wt 179.6 lb

## 2024-03-01 DIAGNOSIS — Z79899 Other long term (current) drug therapy: Secondary | ICD-10-CM | POA: Diagnosis not present

## 2024-03-01 DIAGNOSIS — E785 Hyperlipidemia, unspecified: Secondary | ICD-10-CM | POA: Insufficient documentation

## 2024-03-01 DIAGNOSIS — Z801 Family history of malignant neoplasm of trachea, bronchus and lung: Secondary | ICD-10-CM | POA: Insufficient documentation

## 2024-03-01 DIAGNOSIS — C7951 Secondary malignant neoplasm of bone: Secondary | ICD-10-CM | POA: Insufficient documentation

## 2024-03-01 DIAGNOSIS — C4441 Basal cell carcinoma of skin of scalp and neck: Secondary | ICD-10-CM | POA: Diagnosis not present

## 2024-03-01 DIAGNOSIS — Z8042 Family history of malignant neoplasm of prostate: Secondary | ICD-10-CM | POA: Diagnosis not present

## 2024-03-01 DIAGNOSIS — E039 Hypothyroidism, unspecified: Secondary | ICD-10-CM | POA: Diagnosis not present

## 2024-03-01 DIAGNOSIS — I10 Essential (primary) hypertension: Secondary | ICD-10-CM | POA: Insufficient documentation

## 2024-03-01 DIAGNOSIS — Z8049 Family history of malignant neoplasm of other genital organs: Secondary | ICD-10-CM | POA: Insufficient documentation

## 2024-03-01 DIAGNOSIS — Z7982 Long term (current) use of aspirin: Secondary | ICD-10-CM | POA: Insufficient documentation

## 2024-03-01 DIAGNOSIS — Z79890 Hormone replacement therapy (postmenopausal): Secondary | ICD-10-CM | POA: Insufficient documentation

## 2024-03-02 ENCOUNTER — Encounter: Payer: Self-pay | Admitting: Hematology and Oncology

## 2024-03-07 NOTE — Progress Notes (Unsigned)
 " [facility] [phone]   [phone]  PROGRESS NOTE  Patient Care Team: Yolande Toribio MATSU, MD as PCP - General (Internal Medicine) Watt Rush, MD as Attending Physician (Urology) Izell Domino, MD as Consulting Physician (Radiation Oncology) Malmfelt, Delon CROME, RN as Oncology Nurse Navigator (Oncology) Lynnell Nottingham, MD as Consulting Physician (Dermatology) Federico Rosario BROCKS, MD as Consulting Physician (Gastroenterology) Federico Rush ONEIDA MADISON, MD as Consulting Physician (Hematology and Oncology)  Hematological/Oncological History # Bone Lesions on CT/MRI # 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 Additionally there was noted to be a new enhancing lesion at the tip of the right greater trochanter measuring up to 1.7 cm also suspicious for metastatic disease. 09/27/2020: establish care with Dr. Federico 10/29/2020: CT biopsy performed of left iliac bone lesion. Pathology showed bone and marrow without evidence of carcinoma.  11/11/2022: Cycle 1 Day 1 of cemiplimab  therapy.  12/03/2022: Cycle 2 Day 1 of cemiplimab  therapy.  12/23/2022: Cycle 3 Day 1 of cemiplimab  therapy.  01/13/2023: Cycle 4 Day 1 of cemiplimab  therapy.  02/03/2023: Cycle 5 Day 1 of cemiplimab  therapy.  02/23/2023: Cycle 6 Day 1 of cemiplimab  therapy.  03/16/2023:  Cycle 7 Day 1 of cemiplimab  therapy.  04/06/2023: Cycle 8 Day 1 of cemiplimab  therapy.  04/28/2023: Cycle 9 Day 1 of cemiplimab  therapy.  05/18/2023: Cycle 10 Day 1 of cemiplimab  therapy.  06/08/2023: Cycle 11 Day 1 of cemiplimab  therapy.  06/30/2023: Cycle 12 Day 1 of cemiplimab  therapy.  07/21/2023: Cycle 13 Day 1 of  cemiplimab  therapy.  08/11/2023: Cycle 14 Day 1 of cemplimab therapy.  09/02/2023: Cycle 15 Day 1 of cemiplimab  therapy.  09/23/2023: Cycle 16 Day 1 of cemiplimab  therapy.  10/13/2023: Cycle 17 Day 1 of cemiplimab  therapy.  11/04/2023: Cycle 18 Day 1 of cemiplimab  therapy.  11/24/2023: Cycle 19 Day 1 of cemiplimab  therapy.  12/15/2023: Cycle 20 Day 1 of cemiplimab  therapy.  01/05/2024: Cycle 21 Day 1 of cemiplimab  therapy.  01/26/2024: Cycle 22 Day 1 of cemiplimab  therapy.  02/16/2024: Cycle 23 Day 1 of cemiplimab  therapy.  03/07/2024: Cycle 24 Day 1 of cemiplimab  therapy.    Interval History:  Martin Curry 89 y.o. 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 The patient's last visit was on 12/242025. In the interim since the last visit he has completed Cycle 23 of cemiplimab .  On exam today [name] reports he has been well overall since our last visit.  He had a very productive meeting with Dr. Izell and radiation oncology and reports he will be starting 10 fractions of radiation on 03/20/2024.  He reports is not having any major side effects as result of his infusions other than some occasional fatigue.  He is having some increasing pain in his hip and back but reports it is only when laying down and he is not having any pain while sitting here today.  He is not having any drainage from his ear.  He has to hold his lip while chewing still.  He reports that his weight has remains quite steady and his appetite is strong.  Overall he feels well and has no additional questions concerns or complaints today.  He is willing and able to proceed with treatment at this time.  A full 10 point ROS is otherwise negative.   MEDICAL HISTORY:  Past Medical History:  Diagnosis Date   Atypical nevus 09/07/2002   left abdomen-slight   BCC (basal cell carcinoma of skin) 07/19/2014    left sideburn- +margin-exc.   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)    ? if orignated from skin cancer   Nodular basal cell carcinoma (BCC) 11/21/2012   right side of scalp-CX35FU   Nodular basal cell carcinoma (BCC) 06/12/2014   left sideburn-CX35FU/exc.   Nodular basal cell carcinoma (BCC) 07/16/2015   left scalp-CX35FU   Polycythemia    Prostate cancer (HCC) 1990's   total prostatectomy and adjuvant radiation. Last PSA was 0.02 in 09/2011   SCC (squamous cell carcinoma) 04/03/2009   forehead-txpbx   SCC (squamous cell carcinoma) 07/16/2015   left front scalp   SCC (squamous cell carcinoma) 07/16/2015   left front scalp-Cx35FU   SCCA (squamous cell carcinoma) of skin 12/20/2019   Right forehead (in situ)   SCCA (squamous cell carcinoma) of skin 05/27/2020   Mid Parietal Scalp (in situ)   Superficial basal cell carcinoma (BCC) 11/21/2012   behind left ear-CX35FU, midback-CX35FU, right forehead-CX35FU-exc   Superficial basal cell carcinoma (BCC) 11/02/2017   left post neck-CX35FU   Superficial nodular basal cell carcinoma (BCC) 12/20/2019   Left temporal scalp   Superficial nodular basal cell carcinoma (BCC) 05/27/2020   Right Preauricular area (curet and 5FU)    SURGICAL HISTORY: Past Surgical History:  Procedure Laterality Date   ADJACENT TISSUE TRANSFER/TISSUE REARRANGEMENT N/A 05/22/2019   Procedure: COMPLEX CLOSURE OF NECK WOUND;  Surgeon: Elisabeth Craig RAMAN, MD;  Location: [facility] OR;  Service: Plastics;   Laterality: N/A;   ALLOGRAFT APPLICATION N/A 05/22/2019   Procedure: POSSIBLE FACIAL NERVE RECONSTRUCTION WITH NERVE ALLOGRAFT VS AUTOGRAFT;  Surgeon: Elisabeth Craig RAMAN, MD;  Location: [facility] OR;  Service: Plastics;  Laterality: N/A;   BLADDER SURGERY     due to prostatectomy.    CATARACT EXTRACTION     right   COLONOSCOPY     neg in the past; due in 2014 with [place] Dr. Jakie   EYE SURGERY     1950   HERNIA REPAIR     IR IMAGING GUIDED PORT INSERTION  11/30/2022   MOHS SURGERY     NECK DISSECTION  05/22/2019    NECK DISSECTION   PAROTIDECTOMY  05/22/2019   PAROTIDECTOMY with facial nerve dissection    PAROTIDECTOMY Right 05/22/2019   Procedure: PAROTIDECTOMY;  Surgeon: Jesus Oliphant, MD;  Location: [facility] OR;  Service: ENT;  Laterality: Right;   PROSTATE SURGERY     RADICAL NECK DISSECTION Right 05/22/2019   Procedure: RADICAL NECK DISSECTION;  Surgeon: Jesus Oliphant, MD;  Location: [facility] OR;  Service: ENT;  Laterality: Right;  TONSILLECTOMY      SOCIAL HISTORY: Social History   Socioeconomic History   Marital status: Married    Spouse name: Not on file   Number of children: 3   Years of education: Not on file   Highest education level: Not on file  Occupational History    Comment: retired scientist, 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   Occupation: retired  Tobacco Use   Smoking status: Never   Smokeless tobacco: Never  Vaping Use   Vaping status: Never Used  Substance and Sexual Activity   Alcohol  use: No   Drug use: No   Sexual activity: Not on file  Other Topics Concern   Not on file  Social History Narrative   Not on file   Social Drivers of Health   Tobacco Use: Low Risk (03/01/2024)   Patient History    Smoking Tobacco Use: Never    Smokeless Tobacco Use: Never    Passive Exposure: Not on file  Financial Resource Strain: Not on file  Food Insecurity: No Food Insecurity (03/01/2024)   Epic    Worried About Programme Researcher, Broadcasting/film/video in the Last Year: Never true    Ran Out of Food  in the Last Year: Never true  Transportation Needs: No Transportation Needs (03/01/2024)   Epic    Lack of Transportation (Medical): No    Lack of Transportation (Non-Medical): No  Physical Activity: Not on file  Stress: Not on file  Social Connections: Not on file  Intimate Partner Violence: Not At Risk (03/01/2024)   Epic    Fear of Current or Ex-Partner: No    Emotionally Abused: No    Physically Abused: No    Sexually Abused: No  Depression (PHQ2-9): Low Risk (03/01/2024)   Depression (PHQ2-9)    PHQ-2 Score: 0  Alcohol  Screen: Low Risk (03/01/2024)   Alcohol  Screen    Last Alcohol  Screening Score (AUDIT): 0  Housing: Low Risk (03/01/2024)   Epic    Unable to Pay for Housing in the Last Year: No    Number of Times Moved in the Last Year: 0    Homeless in the Last Year: No  Utilities: Not At Risk (03/01/2024)   Epic    Threatened with loss of utilities: No  Health Literacy: Adequate Health Literacy (10/29/2022)   B1300 Health Literacy    Frequency of need for help with medical instructions: Never    FAMILY HISTORY: Family History  Problem Relation Age of Onset   Uterine cancer Mother 48   Ovarian cancer Mother    Lung cancer Father 20   Prostate cancer Brother    Prostate cancer Brother    Colon cancer Neg Hx    Stomach cancer Neg Hx    Esophageal cancer Neg Hx    Rectal cancer Neg Hx     ALLERGIES:  is allergic to methylprednisolone, penicillins, latex, and tape.  MEDICATIONS:  Current Outpatient Medications  Medication Sig Dispense Refill   amLODipine  (NORVASC ) 5 MG tablet Take 5 mg by mouth daily.      aspirin  81 MG tablet Take 81 mg by mouth daily.     benazepril  (LOTENSIN ) 10 MG tablet Take 10 mg by mouth daily.     famotidine -calcium carbonate-magnesium hydroxide (PEPCID  COMPLETE) 10-800-165 MG chewable tablet Take one tablet PRN     levothyroxine  (SYNTHROID ) 25 MCG tablet Take 25 mcg by mouth daily before breakfast. 2 tABS M,W,F and one the other days      lidocaine -prilocaine  (EMLA ) cream  Apply 1 Application topically as needed. 30 g 0   Multiple Vitamin (MULTIVITAMIN) tablet Take 1 tablet by mouth daily.     ondansetron  (ZOFRAN ) 8 MG tablet Take 1 tablet (8 mg total) by mouth every 8 (eight) hours as needed for nausea or vomiting. 20 tablet 1   simvastatin  (ZOCOR ) 20 MG tablet Take 20 mg by mouth every evening.     No current facility-administered medications for this visit.    REVIEW OF SYSTEMS:   Constitutional: ( - ) fevers, ( - )  chills , ( - ) night sweats Eyes: ( - ) blurriness of vision, ( - ) double vision, ( - ) watery eyes Ears, nose, mouth, throat, and face: ( - ) mucositis, ( - ) sore throat Respiratory: ( - ) cough, ( - ) dyspnea, ( - ) wheezes Cardiovascular: ( - ) palpitation, ( - ) chest discomfort, ( - ) lower extremity swelling Gastrointestinal:  ( - ) nausea, ( - ) heartburn, ( - ) change in bowel habits Skin: ( - ) abnormal skin rashes Lymphatics: ( - ) new lymphadenopathy, ( - ) easy bruising Neurological: ( - ) numbness, ( - ) tingling, ( - ) new weaknesses Behavioral/Psych: ( - ) mood change, ( - ) new changes  All other systems were reviewed with the patient and are negative.  PHYSICAL EXAMINATION: ECOG PERFORMANCE STATUS: 1 - Symptomatic but completely ambulatory  Vitals:   03/08/24 0807  BP: 131/73  Pulse: 69  Resp: 17  Temp: 98.2 F (36.8 C)  SpO2: 96%     Filed Weights   03/08/24 0807  Weight: 182 lb 12.8 oz (82.9 kg)      GENERAL: Well-appearing elderly Caucasian male, alert, no distress and comfortable SKIN: skin color, texture, turgor are normal, no rashes or significant lesions EYES: conjunctiva are pink and non-injected, sclera clear EAR: Orange/red colored drainage from inner ear.  LUNGS: clear to auscultation and percussion with normal breathing effort HEART: regular rate & rhythm and no murmurs and no lower extremity edema Musculoskeletal: no cyanosis of digits and no clubbing   PSYCH: alert & oriented x 3, fluent speech NEURO: no focal motor/sensory deficits  LABORATORY DATA:  I have reviewed the data as listed    Latest Ref Rng & Units 03/08/2024    7:48 AM 02/16/2024    7:59 AM 01/26/2024    8:12 AM  CBC  WBC 4.0 - 10.5 K/uL 6.8  8.1  6.8   Hemoglobin 13.0 - 17.0 g/dL 84.6  84.5  84.4   Hematocrit 39.0 - 52.0 % 42.7  43.3  44.0   Platelets 150 - 400 K/uL 223  226  209        Latest Ref Rng & Units 03/08/2024    7:48 AM 02/16/2024    7:59 AM 01/26/2024    8:12 AM  CMP  Glucose 70 - 99 mg/dL 868  886  876   BUN 8 - 23 mg/dL 19  19  19    Creatinine 0.61 - 1.24 mg/dL 8.59  8.46  8.64   Sodium 135 - 145 mmol/L 141  141  140   Potassium 3.5 - 5.1 mmol/L 4.0  4.0  4.0   Chloride 98 - 111 mmol/L 105  107  106   CO2 22 - 32 mmol/L 23  24  26    Calcium 8.9 - 10.3 mg/dL 9.8  9.8  9.7   Total Protein 6.5 - 8.1 g/dL 7.1  7.0  6.9   Total Bilirubin 0.0 - 1.2 mg/dL 0.5  0.6  0.6   Alkaline Phos 38 - 126 U/L 84  85  79   AST 15 - 41 U/L 27  27  34   ALT 0 - 44 U/L 24  32  40     Lab Results  Component Value Date   MPROTEIN Not Observed 09/27/2020   Lab Results  Component Value Date   KPAFRELGTCHN 25.6 (H) 09/27/2020   LAMBDASER 23.0 09/27/2020   KAPLAMBRATIO 1.11 09/27/2020    RADIOGRAPHIC STUDIES: No results found.      ASSESSMENT & PLAN JAYLEE LANTRY is a 89 y.o. 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 Previously discussed with radiology who notes a repeat biopsy was not recommended and repeat imaging would be preferred.  --negative multiple myeloma labs with SPEP and serum free light chains showing no evidence of monoclonal gammopathy.  --Given his prior  history of prostate cancer we ordered a PSA, which was normal. Repeated PSA previously, found to be within normal limits --CT C/A/P shows no evidence of disease elsewhere in the body in August 2022. Most recent PET CT scan on 10/02/2022 showed hypermetabolic osseous metastasis with a dominant mass centered about the inferior medial portion of the right mastoid, suspicious for site of primary squamous cell carcinoma. -- PET CT scan shows evidence of lesion behind the right ear as well as the known pelvic metastasis. -- Reviewed tissue with pathology to determine if both lesions are the same pathology or different origins.  At this time findings are most consistent with a basal cell primary behind his ear and squamous cell carcinoma of the pelvis.  It is possible these at the same pathology with the basal/squamous crossover, though they may represent separate primaries.  Either way Cemiplimab  therapy should be adequate to treat both. -- PD-L1 testing showed 0% PD-L1 score, however the use of cemiplimab  does not require PD-L1 positivity (lack of PD-L1 prevents the use of pembrolizumab)  PLAN: --Proceed with cycle 24 day 1 of Cemiplimab  immunotherapy today --Labs today show white blood cell 6.8, hemoglobin 15.3, MCV 89.5, platelets 223. --Most recent PET CT scan on 01/28/2024 showed overall stable disease with continued mild increase in FDG avidity in hip lesion.  Next PET in March 2026. --Dr. Izell with radiation oncology is evaluated the patient and believes he is a good candidate for focal radiation to the areas of progressive disease. --Proceed with treatment today without any modifications.  -- Return to clinic for next cycle of cemiplimab  in 3 weeks time.   # Bleeding of the Ear--resolved # Drainage from Ear-- resolved -- secondary due to erosion of the tumor into his ear canal. -- wound has healed without evidence of bleeding.  -- No drainage at this time.  #Thyroid  disease: -- Currently on  Synthroid  50 mcg (2 tablets) M/W/F and then 25 mcg on other days.   # FDG Avid Colon Lesion --Patient underwent colonoscopy with gastroenterology and found to have large polyps. -- No evidence of colonic malignancy.  No orders of the defined types were placed in this encounter.   All questions were answered. The patient knows to call the clinic with any problems, questions or  concerns.   I have spent a total of 30 minutes minutes of face-to-face and non-face-to-face time, preparing to see the patient, performing a medically appropriate examination, counseling and educating the patient,  documenting clinical information in the electronic health record, independently interpreting results and communicating results to the patient, and care coordination.   Norleen IVAR Kidney, MD Department of Hematology/Oncology [facility] Phone: [phone] Pager: [phone] Email: [email or web address]    03/08/2024 8:57 AM "

## 2024-03-08 ENCOUNTER — Inpatient Hospital Stay: Admitting: Hematology and Oncology

## 2024-03-08 ENCOUNTER — Inpatient Hospital Stay

## 2024-03-08 ENCOUNTER — Inpatient Hospital Stay: Attending: Hematology and Oncology

## 2024-03-08 VITALS — BP 131/73 | HR 69 | Temp 98.2°F | Resp 17 | Ht 69.0 in | Wt 182.8 lb

## 2024-03-08 DIAGNOSIS — Z5112 Encounter for antineoplastic immunotherapy: Secondary | ICD-10-CM | POA: Diagnosis present

## 2024-03-08 DIAGNOSIS — C7951 Secondary malignant neoplasm of bone: Secondary | ICD-10-CM

## 2024-03-08 DIAGNOSIS — Z95828 Presence of other vascular implants and grafts: Secondary | ICD-10-CM

## 2024-03-08 DIAGNOSIS — C4492 Squamous cell carcinoma of skin, unspecified: Secondary | ICD-10-CM

## 2024-03-08 DIAGNOSIS — C4441 Basal cell carcinoma of skin of scalp and neck: Secondary | ICD-10-CM

## 2024-03-08 DIAGNOSIS — Z79620 Long term (current) use of immunosuppressive biologic: Secondary | ICD-10-CM | POA: Insufficient documentation

## 2024-03-08 LAB — CBC WITH DIFFERENTIAL (CANCER CENTER ONLY)
Abs Immature Granulocytes: 0.03 K/uL (ref 0.00–0.07)
Basophils Absolute: 0 K/uL (ref 0.0–0.1)
Basophils Relative: 0 %
Eosinophils Absolute: 0.2 K/uL (ref 0.0–0.5)
Eosinophils Relative: 3 %
HCT: 42.7 % (ref 39.0–52.0)
Hemoglobin: 15.3 g/dL (ref 13.0–17.0)
Immature Granulocytes: 0 %
Lymphocytes Relative: 17 %
Lymphs Abs: 1.2 K/uL (ref 0.7–4.0)
MCH: 32.1 pg (ref 26.0–34.0)
MCHC: 35.8 g/dL (ref 30.0–36.0)
MCV: 89.5 fL (ref 80.0–100.0)
Monocytes Absolute: 0.6 K/uL (ref 0.1–1.0)
Monocytes Relative: 9 %
Neutro Abs: 4.8 K/uL (ref 1.7–7.7)
Neutrophils Relative %: 71 %
Platelet Count: 223 K/uL (ref 150–400)
RBC: 4.77 MIL/uL (ref 4.22–5.81)
RDW: 12.9 % (ref 11.5–15.5)
WBC Count: 6.8 K/uL (ref 4.0–10.5)
nRBC: 0 % (ref 0.0–0.2)

## 2024-03-08 LAB — CMP (CANCER CENTER ONLY)
ALT: 24 U/L (ref 0–44)
AST: 27 U/L (ref 15–41)
Albumin: 4.1 g/dL (ref 3.5–5.0)
Alkaline Phosphatase: 84 U/L (ref 38–126)
Anion gap: 13 (ref 5–15)
BUN: 19 mg/dL (ref 8–23)
CO2: 23 mmol/L (ref 22–32)
Calcium: 9.8 mg/dL (ref 8.9–10.3)
Chloride: 105 mmol/L (ref 98–111)
Creatinine: 1.4 mg/dL — ABNORMAL HIGH (ref 0.61–1.24)
GFR, Estimated: 48 mL/min — ABNORMAL LOW
Glucose, Bld: 131 mg/dL — ABNORMAL HIGH (ref 70–99)
Potassium: 4 mmol/L (ref 3.5–5.1)
Sodium: 141 mmol/L (ref 135–145)
Total Bilirubin: 0.5 mg/dL (ref 0.0–1.2)
Total Protein: 7.1 g/dL (ref 6.5–8.1)

## 2024-03-08 LAB — TSH: TSH: 5.08 u[IU]/mL — ABNORMAL HIGH (ref 0.350–4.500)

## 2024-03-08 MED ORDER — SODIUM CHLORIDE 0.9 % IV SOLN: Freq: Once | INTRAVENOUS | Status: AC

## 2024-03-08 MED ORDER — SODIUM CHLORIDE 0.9 % IV SOLN
350.0000 mg | Freq: Once | INTRAVENOUS | Status: AC
  Administered 2024-03-08: 350 mg via INTRAVENOUS
  Filled 2024-03-08: qty 7

## 2024-03-08 NOTE — Patient Instructions (Signed)
 [facility] - A DEPT OF [facility]  Discharge Instructions: Thank you for choosing [facility] to provide your oncology and hematology care.   If you have a lab appointment with the Cancer Center, please go directly to the Cancer Center and check in at the registration area.   Wear comfortable clothing and clothing appropriate for easy access to any Portacath or PICC line.   We strive to give you quality time with your provider. You may need to reschedule your appointment if you arrive late (15 or more minutes).  Arriving late affects you and other patients whose appointments are after yours.  Also, if you miss three or more appointments without notifying the office, you may be dismissed from the clinic at the provider's discretion.      For prescription refill requests, have your pharmacy contact our office and allow 72 hours for refills to be completed.    Today you received the following chemotherapy and/or immunotherapy agents: cemiplimab -rwlc (LIBTAYO )    To help prevent nausea and vomiting after your treatment, we encourage you to take your nausea medication as directed.  BELOW ARE SYMPTOMS THAT SHOULD BE REPORTED IMMEDIATELY: *FEVER GREATER THAN 100.4 F (38 C) OR HIGHER *CHILLS OR SWEATING *NAUSEA AND VOMITING THAT IS NOT CONTROLLED WITH YOUR NAUSEA MEDICATION *UNUSUAL SHORTNESS OF BREATH *UNUSUAL BRUISING OR BLEEDING *URINARY PROBLEMS (pain or burning when urinating, or frequent urination) *BOWEL PROBLEMS (unusual diarrhea, constipation, pain near the anus) TENDERNESS IN MOUTH AND THROAT WITH OR WITHOUT PRESENCE OF ULCERS (sore throat, sores in mouth, or a toothache) UNUSUAL RASH, SWELLING OR PAIN  UNUSUAL VAGINAL DISCHARGE OR ITCHING   Items with * indicate a potential emergency and should be followed up as soon as possible or go to the Emergency Department if any problems should occur.  Please show the CHEMOTHERAPY ALERT CARD or  IMMUNOTHERAPY ALERT CARD at check-in to the Emergency Department and triage nurse.  Should you have questions after your visit or need to cancel or reschedule your appointment, please contact [facility] - [facility] DE[facility]  Dept: [phone]  and follow the prompts.  Office hours are 8:00 a.m. to 4:30 p.m. Monday - Friday. Please note that voicemails left after 4:00 p.m. may not be returned until the following business day.  We are closed weekends and major holidays. You have access to a nurse at all times for urgent questions. Please call the main number to the clinic Dept: [phone] and follow the prompts.   For any non-urgent questions, you may also contact your provider using MyChart. We now offer e-Visits for anyone 58 and older to request care online for non-urgent symptoms. For details visit [email or web address].   Also download the MyChart app! Go to the app store, search MyChart, open the app, select [place], and log in with your MyChart username and password.

## 2024-03-09 ENCOUNTER — Ambulatory Visit
Admission: RE | Admit: 2024-03-09 | Discharge: 2024-03-09 | Disposition: A | Discharge status: Home / Self Care | Payer: Self-pay | Source: Ambulatory Visit | Attending: Radiation Oncology | Admitting: Radiation Oncology

## 2024-03-09 DIAGNOSIS — C4441 Basal cell carcinoma of skin of scalp and neck: Secondary | ICD-10-CM | POA: Diagnosis present

## 2024-03-09 DIAGNOSIS — C7951 Secondary malignant neoplasm of bone: Secondary | ICD-10-CM | POA: Insufficient documentation

## 2024-03-09 LAB — T4: T4, Total: 8.4 ug/dL (ref 4.5–12.0)

## 2024-03-13 ENCOUNTER — Other Ambulatory Visit: Payer: Self-pay

## 2024-03-16 ENCOUNTER — Telehealth: Payer: Self-pay | Admitting: Radiation Oncology

## 2024-03-16 NOTE — Telephone Encounter (Signed)
 1/22 Patient Left voicemail to cancel treatment appt for 1/26 due to bad weather.  Email forward to Support RTT and copied L4 machine and nursing, so they are aware.

## 2024-03-17 ENCOUNTER — Other Ambulatory Visit: Payer: Self-pay

## 2024-03-17 DIAGNOSIS — C7951 Secondary malignant neoplasm of bone: Secondary | ICD-10-CM | POA: Diagnosis not present

## 2024-03-20 ENCOUNTER — Ambulatory Visit: Payer: Self-pay | Admitting: Radiation Oncology

## 2024-03-20 ENCOUNTER — Encounter: Payer: Self-pay | Admitting: Hematology and Oncology

## 2024-03-20 ENCOUNTER — Ambulatory Visit

## 2024-03-21 ENCOUNTER — Ambulatory Visit
Admission: RE | Admit: 2024-03-21 | Discharge: 2024-03-21 | Disposition: A | Discharge status: Home / Self Care | Source: Ambulatory Visit | Attending: Radiation Oncology | Admitting: Radiation Oncology

## 2024-03-21 ENCOUNTER — Ambulatory Visit
Admission: RE | Admit: 2024-03-21 | Discharge: 2024-03-21 | Disposition: A | Discharge status: Home / Self Care | Payer: Self-pay | Source: Ambulatory Visit | Attending: Radiation Oncology | Admitting: Radiation Oncology

## 2024-03-21 ENCOUNTER — Other Ambulatory Visit: Payer: Self-pay

## 2024-03-21 LAB — RAD ONC ARIA SESSION SUMMARY
Course Elapsed Days: 0
Plan Fractions Treated to Date: 1
Plan Fractions Treated to Date: 1
Plan Prescribed Dose Per Fraction: 4 Gy
Plan Prescribed Dose Per Fraction: 4 Gy
Plan Total Fractions Prescribed: 10
Plan Total Fractions Prescribed: 10
Plan Total Prescribed Dose: 40 Gy
Plan Total Prescribed Dose: 40 Gy
Reference Point Dosage Given to Date: 4 Gy
Reference Point Dosage Given to Date: 4 Gy
Reference Point Session Dosage Given: 4 Gy
Reference Point Session Dosage Given: 4 Gy
Session Number: 1

## 2024-03-22 ENCOUNTER — Ambulatory Visit
Admission: RE | Admit: 2024-03-22 | Discharge: 2024-03-22 | Disposition: A | Discharge status: Home / Self Care | Payer: Self-pay | Source: Ambulatory Visit | Attending: Radiation Oncology | Admitting: Radiation Oncology

## 2024-03-22 ENCOUNTER — Other Ambulatory Visit: Payer: Self-pay

## 2024-03-22 LAB — RAD ONC ARIA SESSION SUMMARY
Course Elapsed Days: 1
Plan Fractions Treated to Date: 2
Plan Fractions Treated to Date: 2
Plan Prescribed Dose Per Fraction: 4 Gy
Plan Prescribed Dose Per Fraction: 4 Gy
Plan Total Fractions Prescribed: 10
Plan Total Fractions Prescribed: 10
Plan Total Prescribed Dose: 40 Gy
Plan Total Prescribed Dose: 40 Gy
Reference Point Dosage Given to Date: 8 Gy
Reference Point Dosage Given to Date: 8 Gy
Reference Point Session Dosage Given: 4 Gy
Reference Point Session Dosage Given: 4 Gy
Session Number: 2

## 2024-03-23 ENCOUNTER — Ambulatory Visit: Admission: RE | Admit: 2024-03-23 | Payer: Self-pay | Source: Ambulatory Visit

## 2024-03-24 ENCOUNTER — Other Ambulatory Visit: Payer: Self-pay

## 2024-03-24 ENCOUNTER — Ambulatory Visit
Admission: RE | Admit: 2024-03-24 | Discharge: 2024-03-24 | Disposition: A | Payer: Self-pay | Source: Ambulatory Visit | Attending: Radiation Oncology

## 2024-03-24 LAB — RAD ONC ARIA SESSION SUMMARY
Course Elapsed Days: 3
Plan Fractions Treated to Date: 3
Plan Fractions Treated to Date: 3
Plan Prescribed Dose Per Fraction: 4 Gy
Plan Prescribed Dose Per Fraction: 4 Gy
Plan Total Fractions Prescribed: 10
Plan Total Fractions Prescribed: 10
Plan Total Prescribed Dose: 40 Gy
Plan Total Prescribed Dose: 40 Gy
Reference Point Dosage Given to Date: 12 Gy
Reference Point Dosage Given to Date: 12 Gy
Reference Point Session Dosage Given: 4 Gy
Reference Point Session Dosage Given: 4 Gy
Session Number: 3

## 2024-03-27 ENCOUNTER — Ambulatory Visit: Payer: Self-pay

## 2024-03-27 ENCOUNTER — Ambulatory Visit

## 2024-03-28 ENCOUNTER — Ambulatory Visit
Admission: RE | Admit: 2024-03-28 | Discharge: 2024-03-28 | Disposition: A | Source: Ambulatory Visit | Attending: Radiation Oncology

## 2024-03-28 ENCOUNTER — Other Ambulatory Visit: Payer: Self-pay

## 2024-03-28 ENCOUNTER — Ambulatory Visit
Admission: RE | Admit: 2024-03-28 | Discharge: 2024-03-28 | Disposition: A | Payer: Self-pay | Source: Ambulatory Visit | Attending: Radiation Oncology

## 2024-03-28 LAB — RAD ONC ARIA SESSION SUMMARY
Course Elapsed Days: 7
Plan Fractions Treated to Date: 4
Plan Fractions Treated to Date: 4
Plan Prescribed Dose Per Fraction: 4 Gy
Plan Prescribed Dose Per Fraction: 4 Gy
Plan Total Fractions Prescribed: 10
Plan Total Fractions Prescribed: 10
Plan Total Prescribed Dose: 40 Gy
Plan Total Prescribed Dose: 40 Gy
Reference Point Dosage Given to Date: 16 Gy
Reference Point Dosage Given to Date: 16 Gy
Reference Point Session Dosage Given: 4 Gy
Reference Point Session Dosage Given: 4 Gy
Session Number: 4

## 2024-03-29 ENCOUNTER — Inpatient Hospital Stay

## 2024-03-29 ENCOUNTER — Other Ambulatory Visit: Payer: Self-pay

## 2024-03-29 ENCOUNTER — Inpatient Hospital Stay: Attending: Hematology and Oncology

## 2024-03-29 ENCOUNTER — Ambulatory Visit
Admission: RE | Admit: 2024-03-29 | Discharge: 2024-03-29 | Disposition: A | Payer: Self-pay | Source: Ambulatory Visit | Attending: Radiation Oncology

## 2024-03-29 ENCOUNTER — Inpatient Hospital Stay: Attending: Hematology and Oncology | Admitting: Hematology and Oncology

## 2024-03-29 VITALS — BP 132/81 | HR 82 | Temp 98.0°F | Resp 18 | Wt 182.5 lb

## 2024-03-29 DIAGNOSIS — C4441 Basal cell carcinoma of skin of scalp and neck: Secondary | ICD-10-CM

## 2024-03-29 DIAGNOSIS — Z5112 Encounter for antineoplastic immunotherapy: Secondary | ICD-10-CM

## 2024-03-29 DIAGNOSIS — C7951 Secondary malignant neoplasm of bone: Secondary | ICD-10-CM

## 2024-03-29 DIAGNOSIS — Z95828 Presence of other vascular implants and grafts: Secondary | ICD-10-CM | POA: Diagnosis not present

## 2024-03-29 DIAGNOSIS — C4492 Squamous cell carcinoma of skin, unspecified: Secondary | ICD-10-CM

## 2024-03-29 LAB — RAD ONC ARIA SESSION SUMMARY
Course Elapsed Days: 8
Plan Fractions Treated to Date: 5
Plan Fractions Treated to Date: 5
Plan Prescribed Dose Per Fraction: 4 Gy
Plan Prescribed Dose Per Fraction: 4 Gy
Plan Total Fractions Prescribed: 10
Plan Total Fractions Prescribed: 10
Plan Total Prescribed Dose: 40 Gy
Plan Total Prescribed Dose: 40 Gy
Reference Point Dosage Given to Date: 20 Gy
Reference Point Dosage Given to Date: 20 Gy
Reference Point Session Dosage Given: 4 Gy
Reference Point Session Dosage Given: 4 Gy
Session Number: 5

## 2024-03-29 LAB — CBC WITH DIFFERENTIAL (CANCER CENTER ONLY)
Abs Immature Granulocytes: 0.06 10*3/uL (ref 0.00–0.07)
Basophils Absolute: 0 10*3/uL (ref 0.0–0.1)
Basophils Relative: 0 %
Eosinophils Absolute: 0.1 10*3/uL (ref 0.0–0.5)
Eosinophils Relative: 1 %
HCT: 42.4 % (ref 39.0–52.0)
Hemoglobin: 15.1 g/dL (ref 13.0–17.0)
Immature Granulocytes: 1 %
Lymphocytes Relative: 9 %
Lymphs Abs: 0.8 10*3/uL (ref 0.7–4.0)
MCH: 32 pg (ref 26.0–34.0)
MCHC: 35.6 g/dL (ref 30.0–36.0)
MCV: 89.8 fL (ref 80.0–100.0)
Monocytes Absolute: 0.6 10*3/uL (ref 0.1–1.0)
Monocytes Relative: 7 %
Neutro Abs: 7 10*3/uL (ref 1.7–7.7)
Neutrophils Relative %: 82 %
Platelet Count: 211 10*3/uL (ref 150–400)
RBC: 4.72 MIL/uL (ref 4.22–5.81)
RDW: 13.1 % (ref 11.5–15.5)
WBC Count: 8.6 10*3/uL (ref 4.0–10.5)
nRBC: 0 % (ref 0.0–0.2)

## 2024-03-29 LAB — CMP (CANCER CENTER ONLY)
ALT: 29 U/L (ref 0–44)
AST: 26 U/L (ref 15–41)
Albumin: 4.3 g/dL (ref 3.5–5.0)
Alkaline Phosphatase: 86 U/L (ref 38–126)
Anion gap: 10 (ref 5–15)
BUN: 21 mg/dL (ref 8–23)
CO2: 25 mmol/L (ref 22–32)
Calcium: 9.8 mg/dL (ref 8.9–10.3)
Chloride: 105 mmol/L (ref 98–111)
Creatinine: 1.28 mg/dL — ABNORMAL HIGH (ref 0.61–1.24)
GFR, Estimated: 53 mL/min — ABNORMAL LOW
Glucose, Bld: 168 mg/dL — ABNORMAL HIGH (ref 70–99)
Potassium: 4.1 mmol/L (ref 3.5–5.1)
Sodium: 139 mmol/L (ref 135–145)
Total Bilirubin: 0.7 mg/dL (ref 0.0–1.2)
Total Protein: 7 g/dL (ref 6.5–8.1)

## 2024-03-29 LAB — TSH: TSH: 3.09 u[IU]/mL (ref 0.350–4.500)

## 2024-03-29 MED ORDER — SODIUM CHLORIDE 0.9 % IV SOLN: Freq: Once | INTRAVENOUS | Status: AC

## 2024-03-29 MED ORDER — SODIUM CHLORIDE 0.9 % IV SOLN
350.0000 mg | Freq: Once | INTRAVENOUS | Status: AC
  Administered 2024-03-29: 350 mg via INTRAVENOUS
  Filled 2024-03-29: qty 7

## 2024-03-29 NOTE — Patient Instructions (Signed)
 [facility] - A DEPT OF [facility]  Discharge Instructions: Thank you for choosing [facility] to provide your oncology and hematology care.   If you have a lab appointment with the Cancer Center, please go directly to the Cancer Center and check in at the registration area.   Wear comfortable clothing and clothing appropriate for easy access to any Portacath or PICC line.   We strive to give you quality time with your provider. You may need to reschedule your appointment if you arrive late (15 or more minutes).  Arriving late affects you and other patients whose appointments are after yours.  Also, if you miss three or more appointments without notifying the office, you may be dismissed from the clinic at the provider's discretion.      For prescription refill requests, have your pharmacy contact our office and allow 72 hours for refills to be completed.    Today you received the following chemotherapy and/or immunotherapy agents: cemiplimab -rwlc (LIBTAYO )    To help prevent nausea and vomiting after your treatment, we encourage you to take your nausea medication as directed.  BELOW ARE SYMPTOMS THAT SHOULD BE REPORTED IMMEDIATELY: *FEVER GREATER THAN 100.4 F (38 C) OR HIGHER *CHILLS OR SWEATING *NAUSEA AND VOMITING THAT IS NOT CONTROLLED WITH YOUR NAUSEA MEDICATION *UNUSUAL SHORTNESS OF BREATH *UNUSUAL BRUISING OR BLEEDING *URINARY PROBLEMS (pain or burning when urinating, or frequent urination) *BOWEL PROBLEMS (unusual diarrhea, constipation, pain near the anus) TENDERNESS IN MOUTH AND THROAT WITH OR WITHOUT PRESENCE OF ULCERS (sore throat, sores in mouth, or a toothache) UNUSUAL RASH, SWELLING OR PAIN  UNUSUAL VAGINAL DISCHARGE OR ITCHING   Items with * indicate a potential emergency and should be followed up as soon as possible or go to the Emergency Department if any problems should occur.  Please show the CHEMOTHERAPY ALERT CARD or  IMMUNOTHERAPY ALERT CARD at check-in to the Emergency Department and triage nurse.  Should you have questions after your visit or need to cancel or reschedule your appointment, please contact [facility] - [facility] DE[facility]  Dept: [phone]  and follow the prompts.  Office hours are 8:00 a.m. to 4:30 p.m. Monday - Friday. Please note that voicemails left after 4:00 p.m. may not be returned until the following business day.  We are closed weekends and major holidays. You have access to a nurse at all times for urgent questions. Please call the main number to the clinic Dept: [phone] and follow the prompts.   For any non-urgent questions, you may also contact your provider using MyChart. We now offer e-Visits for anyone 58 and older to request care online for non-urgent symptoms. For details visit [email or web address].   Also download the MyChart app! Go to the app store, search MyChart, open the app, select [place], and log in with your MyChart username and password.

## 2024-03-30 ENCOUNTER — Other Ambulatory Visit: Payer: Self-pay

## 2024-03-30 ENCOUNTER — Ambulatory Visit
Admission: RE | Admit: 2024-03-30 | Discharge: 2024-03-30 | Disposition: A | Payer: Self-pay | Source: Ambulatory Visit | Attending: Radiation Oncology

## 2024-03-30 LAB — RAD ONC ARIA SESSION SUMMARY
Course Elapsed Days: 9
Plan Fractions Treated to Date: 6
Plan Fractions Treated to Date: 6
Plan Prescribed Dose Per Fraction: 4 Gy
Plan Prescribed Dose Per Fraction: 4 Gy
Plan Total Fractions Prescribed: 10
Plan Total Fractions Prescribed: 10
Plan Total Prescribed Dose: 40 Gy
Plan Total Prescribed Dose: 40 Gy
Reference Point Dosage Given to Date: 24 Gy
Reference Point Dosage Given to Date: 24 Gy
Reference Point Session Dosage Given: 4 Gy
Reference Point Session Dosage Given: 4 Gy
Session Number: 6

## 2024-03-30 LAB — T4: T4, Total: 8.3 ug/dL (ref 4.5–12.0)

## 2024-03-31 ENCOUNTER — Other Ambulatory Visit: Payer: Self-pay

## 2024-03-31 ENCOUNTER — Ambulatory Visit: Admission: RE | Admit: 2024-03-31 | Payer: Self-pay | Source: Ambulatory Visit

## 2024-03-31 LAB — RAD ONC ARIA SESSION SUMMARY
Course Elapsed Days: 10
Plan Fractions Treated to Date: 7
Plan Fractions Treated to Date: 7
Plan Prescribed Dose Per Fraction: 4 Gy
Plan Prescribed Dose Per Fraction: 4 Gy
Plan Total Fractions Prescribed: 10
Plan Total Fractions Prescribed: 10
Plan Total Prescribed Dose: 40 Gy
Plan Total Prescribed Dose: 40 Gy
Reference Point Dosage Given to Date: 28 Gy
Reference Point Dosage Given to Date: 28 Gy
Reference Point Session Dosage Given: 4 Gy
Reference Point Session Dosage Given: 4 Gy
Session Number: 7

## 2024-04-03 ENCOUNTER — Ambulatory Visit

## 2024-04-04 ENCOUNTER — Ambulatory Visit

## 2024-04-05 ENCOUNTER — Ambulatory Visit

## 2024-04-19 ENCOUNTER — Inpatient Hospital Stay

## 2024-04-19 ENCOUNTER — Inpatient Hospital Stay: Admitting: Hematology and Oncology

## 2024-05-10 ENCOUNTER — Inpatient Hospital Stay

## 2024-05-10 ENCOUNTER — Inpatient Hospital Stay: Admitting: Hematology and Oncology

## 2024-05-31 ENCOUNTER — Inpatient Hospital Stay: Admitting: Physician Assistant

## 2024-05-31 ENCOUNTER — Inpatient Hospital Stay: Attending: Hematology and Oncology

## 2024-05-31 ENCOUNTER — Inpatient Hospital Stay

## 2024-06-21 ENCOUNTER — Inpatient Hospital Stay: Admitting: Hematology and Oncology

## 2024-06-21 ENCOUNTER — Inpatient Hospital Stay
